# Patient Record
Sex: Female | Born: 1950 | Race: White | Hispanic: No | Marital: Single | Smoking: Never smoker
Health system: Southern US, Community
[De-identification: ages and names within clinical notes are randomized; demographics above are authoritative.]

## PROBLEM LIST (undated history)

## (undated) DIAGNOSIS — K589 Irritable bowel syndrome without diarrhea: Secondary | ICD-10-CM

## (undated) DIAGNOSIS — C419 Malignant neoplasm of bone and articular cartilage, unspecified: Secondary | ICD-10-CM

## (undated) DIAGNOSIS — J40 Bronchitis, not specified as acute or chronic: Secondary | ICD-10-CM

## (undated) DIAGNOSIS — J189 Pneumonia, unspecified organism: Secondary | ICD-10-CM

## (undated) DIAGNOSIS — M8580 Other specified disorders of bone density and structure, unspecified site: Secondary | ICD-10-CM

## (undated) DIAGNOSIS — C3490 Malignant neoplasm of unspecified part of unspecified bronchus or lung: Secondary | ICD-10-CM

## (undated) DIAGNOSIS — C719 Malignant neoplasm of brain, unspecified: Secondary | ICD-10-CM

## (undated) DIAGNOSIS — Z923 Personal history of irradiation: Secondary | ICD-10-CM

## (undated) DIAGNOSIS — Z8639 Personal history of other endocrine, nutritional and metabolic disease: Secondary | ICD-10-CM

## (undated) DIAGNOSIS — T7840XA Allergy, unspecified, initial encounter: Secondary | ICD-10-CM

## (undated) DIAGNOSIS — IMO0002 Reserved for concepts with insufficient information to code with codable children: Secondary | ICD-10-CM

## (undated) HISTORY — DX: Pneumonia, unspecified organism: J18.9

## (undated) HISTORY — DX: Personal history of other endocrine, nutritional and metabolic disease: Z86.39

## (undated) HISTORY — PX: TONSILLECTOMY AND ADENOIDECTOMY: SUR1326

## (undated) HISTORY — DX: Reserved for concepts with insufficient information to code with codable children: IMO0002

## (undated) HISTORY — DX: Other specified disorders of bone density and structure, unspecified site: M85.80

## (undated) HISTORY — DX: Irritable bowel syndrome, unspecified: K58.9

## (undated) HISTORY — PX: WISDOM TOOTH EXTRACTION: SHX21

## (undated) HISTORY — DX: Allergy, unspecified, initial encounter: T78.40XA

---

## 1999-12-13 ENCOUNTER — Other Ambulatory Visit: Admission: RE | Admit: 1999-12-13 | Discharge: 1999-12-13 | Payer: Self-pay | Admitting: Gynecology

## 2000-03-05 ENCOUNTER — Other Ambulatory Visit: Admission: RE | Admit: 2000-03-05 | Discharge: 2000-03-05 | Payer: Self-pay | Admitting: Gynecology

## 2000-07-21 ENCOUNTER — Other Ambulatory Visit: Admission: RE | Admit: 2000-07-21 | Discharge: 2000-07-21 | Payer: Self-pay | Admitting: Obstetrics and Gynecology

## 2001-01-06 ENCOUNTER — Other Ambulatory Visit: Admission: RE | Admit: 2001-01-06 | Discharge: 2001-01-06 | Payer: Self-pay | Admitting: Obstetrics and Gynecology

## 2002-01-07 ENCOUNTER — Other Ambulatory Visit: Admission: RE | Admit: 2002-01-07 | Discharge: 2002-01-07 | Payer: Self-pay | Admitting: Obstetrics and Gynecology

## 2003-01-31 ENCOUNTER — Other Ambulatory Visit: Admission: RE | Admit: 2003-01-31 | Discharge: 2003-01-31 | Payer: Self-pay | Admitting: Obstetrics and Gynecology

## 2004-02-20 ENCOUNTER — Other Ambulatory Visit: Admission: RE | Admit: 2004-02-20 | Discharge: 2004-02-20 | Payer: Self-pay | Admitting: Obstetrics and Gynecology

## 2005-01-24 ENCOUNTER — Ambulatory Visit: Payer: Self-pay | Admitting: Internal Medicine

## 2005-03-04 ENCOUNTER — Other Ambulatory Visit: Admission: RE | Admit: 2005-03-04 | Discharge: 2005-03-04 | Payer: Self-pay | Admitting: Obstetrics and Gynecology

## 2006-03-18 ENCOUNTER — Other Ambulatory Visit: Admission: RE | Admit: 2006-03-18 | Discharge: 2006-03-18 | Payer: Self-pay | Admitting: Obstetrics and Gynecology

## 2007-01-05 ENCOUNTER — Ambulatory Visit: Payer: Self-pay | Admitting: Internal Medicine

## 2007-01-05 DIAGNOSIS — T753XXA Motion sickness, initial encounter: Secondary | ICD-10-CM

## 2007-01-05 DIAGNOSIS — R011 Cardiac murmur, unspecified: Secondary | ICD-10-CM

## 2007-04-01 ENCOUNTER — Other Ambulatory Visit: Admission: RE | Admit: 2007-04-01 | Discharge: 2007-04-01 | Payer: Self-pay | Admitting: Obstetrics and Gynecology

## 2007-08-06 HISTORY — PX: COLONOSCOPY: SHX174

## 2008-05-03 ENCOUNTER — Ambulatory Visit: Payer: Self-pay | Admitting: Obstetrics and Gynecology

## 2008-05-03 ENCOUNTER — Encounter: Payer: Self-pay | Admitting: Obstetrics and Gynecology

## 2008-05-03 ENCOUNTER — Other Ambulatory Visit: Admission: RE | Admit: 2008-05-03 | Discharge: 2008-05-03 | Payer: Self-pay | Admitting: Obstetrics and Gynecology

## 2008-05-23 ENCOUNTER — Ambulatory Visit: Payer: Self-pay | Admitting: Obstetrics and Gynecology

## 2009-01-09 ENCOUNTER — Ambulatory Visit: Payer: Self-pay | Admitting: Internal Medicine

## 2009-01-09 DIAGNOSIS — H9319 Tinnitus, unspecified ear: Secondary | ICD-10-CM | POA: Insufficient documentation

## 2009-01-09 DIAGNOSIS — H6980 Other specified disorders of Eustachian tube, unspecified ear: Secondary | ICD-10-CM

## 2009-01-09 DIAGNOSIS — M7580 Other shoulder lesions, unspecified shoulder: Secondary | ICD-10-CM

## 2009-01-24 ENCOUNTER — Ambulatory Visit: Payer: Self-pay | Admitting: Internal Medicine

## 2009-02-23 ENCOUNTER — Encounter: Payer: Self-pay | Admitting: Internal Medicine

## 2009-02-23 ENCOUNTER — Ambulatory Visit: Payer: Self-pay | Admitting: Internal Medicine

## 2009-02-24 ENCOUNTER — Encounter: Payer: Self-pay | Admitting: Internal Medicine

## 2009-10-05 ENCOUNTER — Other Ambulatory Visit: Admission: RE | Admit: 2009-10-05 | Discharge: 2009-10-05 | Payer: Self-pay | Admitting: Obstetrics and Gynecology

## 2009-10-05 ENCOUNTER — Ambulatory Visit: Payer: Self-pay | Admitting: Obstetrics and Gynecology

## 2012-03-09 ENCOUNTER — Encounter: Payer: Self-pay | Admitting: Internal Medicine

## 2012-03-09 ENCOUNTER — Ambulatory Visit (INDEPENDENT_AMBULATORY_CARE_PROVIDER_SITE_OTHER): Payer: BC Managed Care – PPO | Admitting: Internal Medicine

## 2012-03-09 VITALS — BP 140/90 | HR 58 | Temp 97.8°F | Wt 154.0 lb

## 2012-03-09 DIAGNOSIS — R197 Diarrhea, unspecified: Secondary | ICD-10-CM

## 2012-03-09 LAB — CBC WITH DIFFERENTIAL/PLATELET
Basophils Absolute: 0 10*3/uL (ref 0.0–0.1)
Basophils Relative: 0.5 % (ref 0.0–3.0)
Eosinophils Absolute: 0.1 10*3/uL (ref 0.0–0.7)
HCT: 38.6 % (ref 36.0–46.0)
Hemoglobin: 13.1 g/dL (ref 12.0–15.0)
Lymphs Abs: 2 10*3/uL (ref 0.7–4.0)
MCHC: 34 g/dL (ref 30.0–36.0)
MCV: 93.1 fl (ref 78.0–100.0)
Monocytes Absolute: 0.4 10*3/uL (ref 0.1–1.0)
Neutro Abs: 3.8 10*3/uL (ref 1.4–7.7)
RBC: 4.14 Mil/uL (ref 3.87–5.11)
RDW: 13.1 % (ref 11.5–14.6)

## 2012-03-09 LAB — BASIC METABOLIC PANEL
CO2: 27 mEq/L (ref 19–32)
Chloride: 106 mEq/L (ref 96–112)
Glucose, Bld: 81 mg/dL (ref 70–99)
Sodium: 140 mEq/L (ref 135–145)

## 2012-03-09 MED ORDER — HYOSCYAMINE SULFATE 0.125 MG SL SUBL: 0.1250 mg | SUBLINGUAL_TABLET | SUBLINGUAL | Status: DC | PRN

## 2012-03-09 NOTE — Patient Instructions (Addendum)
Please take the probiotic , Align, every day until the bowels are normal. This will replace the normal bacteria which  are necessary for formation of normal stool and processing of food.  Please try to go on My Chart within the next 24 hours to allow me to release the results directly to you.  

## 2012-03-09 NOTE — Progress Notes (Signed)
  Subjective:    Patient ID: YSENIA FILICE, female    DOB: [birth year], 61 y.o.   MRN: [id number]  HPI In the last 6 weeks she has had frank "explosive" diarrhea at least once a week up to 2 very large BMs with cramping. She's had chronic issues with increased elimitation following the ingestion of fresh fruits and vegetables or red meat. She quit eating red meat 20 years ago. In the last 2 weeks she has stopped eating salads with some improvement in her symptoms. She does have some abdominal discomfort in the lateral abdomen bilaterally and some sweats with the diarrhea.  Her last colonoscopy was in 2009: It was negative. There is no personal or family history of ulcers, colitis, colon polyps, or colon cancer.    Review of Systems She denies nausea, vomiting, weight loss, decreased urine output, lightheadedness, fever, melena, rectal bleeding or chills. She does have a well and drinks from it. No one else using the  well has symptoms. Other than restricting salads; her dietary changes including increased "comfort foods". She has not had antibiotics in the recent past; foreign travel; or exposure to sick animals.  She did have to place her mother in a retirement facility; but she denies any significant anxiety or depression        Objective:   Physical Exam General appearance is one of good health and nourishment w/o distress.  Eyes: No conjunctival inflammation or scleral icterus is present.  Neck: Thyroid normal to palpation  Oral exam: Dental hygiene is good; lips and gums are healthy appearing.There is no oropharyngeal erythema or exudate noted.   Heart:  Normal rate and regular rhythm. S1 and S2 normal without gallop,  click, rub or other extra sounds  .Grade 1/2-1 over 6 systolic murmur @ R base   Lungs:Chest clear to auscultation; no wheezes, rhonchi,rales ,or rubs present.No increased work of breathing.   Abdomen: bowel sounds normal, soft and non-tender without masses, organomegaly  or hernias noted.  No guarding or rebound   Skin:Warm & dry.  Intact without suspicious lesions or rashes ; no jaundice or tenting  Lymphatic: No lymphadenopathy is noted about the head, neck, axilla areas.    Vascular: All pulses intact with no deficits or bruits.  Neuro: Deep tendon reflexes equal and normal. Alert and oriented x3. No tremor           Assessment & Plan:   #1 explosive diarrhea on average once a week. History suggests a possible trigger rather than underlying GI issues. She'll be placed on Align as a trial of prevention. Levsin SL will be prescribed as needed for the symptoms. See labs

## 2012-04-29 ENCOUNTER — Encounter: Payer: Self-pay | Admitting: Obstetrics and Gynecology

## 2012-05-08 ENCOUNTER — Encounter: Payer: Self-pay | Admitting: Internal Medicine

## 2012-05-08 ENCOUNTER — Ambulatory Visit (INDEPENDENT_AMBULATORY_CARE_PROVIDER_SITE_OTHER): Payer: BC Managed Care – PPO | Admitting: Internal Medicine

## 2012-05-08 ENCOUNTER — Telehealth: Payer: Self-pay | Admitting: Internal Medicine

## 2012-05-08 VITALS — BP 118/80 | HR 72 | Temp 98.0°F | Resp 12 | Ht 67.5 in | Wt 151.2 lb

## 2012-05-08 DIAGNOSIS — H8110 Benign paroxysmal vertigo, unspecified ear: Secondary | ICD-10-CM

## 2012-05-08 DIAGNOSIS — H9312 Tinnitus, left ear: Secondary | ICD-10-CM

## 2012-05-08 DIAGNOSIS — H9319 Tinnitus, unspecified ear: Secondary | ICD-10-CM

## 2012-05-08 DIAGNOSIS — Z23 Encounter for immunization: Secondary | ICD-10-CM

## 2012-05-08 DIAGNOSIS — Z0000 Encounter for general adult medical examination without abnormal findings: Secondary | ICD-10-CM

## 2012-05-08 LAB — LIPID PANEL
Cholesterol: 153 mg/dL (ref 0–200)
VLDL: 10.8 mg/dL (ref 0.0–40.0)

## 2012-05-08 NOTE — Progress Notes (Signed)
  Subjective:    Patient ID: Laura Walter, female    DOB: 1950/12/25, 61 y.o.   MRN: [id number]  HPI  She  is here for a physical;acute issues include motion sickness and benign positional vertigo.      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 She feels lightheaded when she steps off her treadmill and also with rapid rotational movement of her head. She also describes BPV symptoms   She uses Transderm-Scop to treat motion sickness as needed     Objective:   Physical Exam Gen.: Thin but healthy and well-nourished in appearance. Alert, appropriate and cooperative throughout exam. Head: Normocephalic without obvious abnormalities  Eyes: No corneal or conjunctival inflammation noted. Pupils equal round reactive to light and accommodation. Fundal exam is benign without hemorrhages, exudate, papilledema. Extraocular motion intact. Vision grossly normal with lenses.Ptosis OD > OS Ears: External  ear exam reveals no significant lesions or deformities. Canals clear .TMs normal. Hearing is grossly normal bilaterally.Tuning fork exam normal Nose: External nasal exam reveals no deformity or inflammation. Nasal mucosa are pink and moist. No lesions or exudates noted. Septum  Slightly deviated Mouth: Oral mucosa and oropharynx reveal no lesions or exudates. Teeth in good repair. Neck: No deformities, masses, or tenderness noted. Range of motion & Thyroid normal. Lungs: Normal respiratory effort; chest expands symmetrically. Lungs are clear to auscultation without rales, wheezes, or increased work of breathing. Heart: Normal rate and rhythm. Normal S1 and S2. No gallop, click, or rub. S4 with slurring at base . Abdomen: Bowel sounds normal; abdomen soft and nontender. No masses, organomegaly or hernias noted. Genitalia:as per Gyn                                                      Musculoskeletal/extremities: No deformity or scoliosis  noted of  the thoracic or lumbar spine. No clubbing, cyanosis, edema, or deformity noted. Range of motion  normal .Tone & strength  Normal.Joints:minor isolated DIP changes. Nail health  good. Vascular: Carotid, radial artery, dorsalis pedis and  posterior tibial pulses are full and equal. No bruits present. Neurologic: Alert and oriented x3. Deep tendon reflexes symmetrical and normal.          Skin: Intact without suspicious lesions or rashes. Lymph: No cervical, axillary lymphadenopathy present. Psych: Mood and affect are normal. Normally interactive                                                                                        Assessment & Plan:  #1 comprehensive physical exam; no acute findings #2 see Problem List with Assessments & Recommendations Plan: see Orders

## 2012-05-08 NOTE — Telephone Encounter (Signed)
Dr. Alwyn Ren, in reference to referral entered to Dr. Gladys Damme, as required by their office, I reviewed all patient's symptoms, and read your notes from the exam.  His office states because the patient does not have hearing loss, they are suggesting she start by being seen by Neurology.  (It is always so hard to get his office to accept a patient).  Please advise where to send patient, [facility], or to a neurologist?

## 2012-05-08 NOTE — Patient Instructions (Addendum)
Preventive Health Care: Exercise  30-45  minutes a day, 3-4 days a week. Walking is especially valuable in preventing Osteoporosis. Eat a low-fat diet with lots of fruits and vegetables, up to 7-9 servings per day. Consume less than 30 grams of sugar per day from foods & drinks with High Fructose Corn Syrup as #1,2,3 or #4 on label. Review and correct the record as indicated. Please share record with all medical staff seen.   If you activate My Chart; the results can be released to you as soon as they populate from the lab. If you choose not to use this program; the labs have to be reviewed, copied & mailed  causing a delay in getting the results to you.

## 2012-05-11 NOTE — Telephone Encounter (Signed)
So, at this point, I am awaiting her response to you, then you will let me know where to refer?

## 2012-05-11 NOTE — Telephone Encounter (Signed)
I have asked her to consider referral to [facility] or another Medical Center to evaluate her symptoms.

## 2012-05-18 NOTE — Telephone Encounter (Signed)
Per my call to patient, she declined consult with [facility], wants to stay local.  I have referred patient to [facility] per her request.

## 2012-08-11 ENCOUNTER — Telehealth: Payer: Self-pay | Admitting: *Deleted

## 2012-08-11 NOTE — Telephone Encounter (Signed)
Transderm scop one patch every 3 days while on a boat.  Verify # needed please

## 2012-08-11 NOTE — Telephone Encounter (Signed)
Pt left VM that she would like to renew her Rx for the motion sickness med..Please advise    Left message to call office to get further details.

## 2012-08-12 MED ORDER — SCOPOLAMINE 1 MG/3DAYS TD PT72: 1.0000 | MEDICATED_PATCH | TRANSDERMAL | Status: DC

## 2012-08-12 NOTE — Telephone Encounter (Signed)
Pt states that she will be gone 5 days. Pt aware Rx sent.

## 2012-09-19 ENCOUNTER — Other Ambulatory Visit: Payer: Self-pay

## 2013-01-01 ENCOUNTER — Other Ambulatory Visit: Payer: Self-pay | Admitting: Internal Medicine

## 2013-06-10 ENCOUNTER — Other Ambulatory Visit: Payer: Self-pay

## 2013-09-13 ENCOUNTER — Encounter: Payer: Self-pay | Admitting: Gynecology

## 2013-10-07 ENCOUNTER — Encounter: Payer: Self-pay | Admitting: Gynecology

## 2013-10-07 ENCOUNTER — Ambulatory Visit (INDEPENDENT_AMBULATORY_CARE_PROVIDER_SITE_OTHER): Payer: BC Managed Care – PPO | Admitting: Gynecology

## 2013-10-07 ENCOUNTER — Other Ambulatory Visit (HOSPITAL_COMMUNITY)
Admission: RE | Admit: 2013-10-07 | Discharge: 2013-10-07 | Disposition: A | Discharge status: Home / Self Care | Payer: BC Managed Care – PPO | Source: Ambulatory Visit | Attending: Gynecology | Admitting: Gynecology

## 2013-10-07 VITALS — BP 136/90 | Ht 67.25 in | Wt 152.9 lb

## 2013-10-07 DIAGNOSIS — M8580 Other specified disorders of bone density and structure, unspecified site: Secondary | ICD-10-CM | POA: Insufficient documentation

## 2013-10-07 DIAGNOSIS — Z01419 Encounter for gynecological examination (general) (routine) without abnormal findings: Secondary | ICD-10-CM | POA: Insufficient documentation

## 2013-10-07 DIAGNOSIS — N951 Menopausal and female climacteric states: Secondary | ICD-10-CM

## 2013-10-07 DIAGNOSIS — Z1151 Encounter for screening for human papillomavirus (HPV): Secondary | ICD-10-CM | POA: Insufficient documentation

## 2013-10-07 DIAGNOSIS — M899 Disorder of bone, unspecified: Secondary | ICD-10-CM

## 2013-10-07 DIAGNOSIS — I10 Essential (primary) hypertension: Secondary | ICD-10-CM

## 2013-10-07 DIAGNOSIS — Z780 Asymptomatic menopausal state: Secondary | ICD-10-CM

## 2013-10-07 DIAGNOSIS — M949 Disorder of cartilage, unspecified: Secondary | ICD-10-CM

## 2013-10-07 NOTE — Progress Notes (Addendum)
Laura Walter 1951-01-27 [id number]   History:    63 y.o.  for annual gyn exam with no complaints today. Patient has not been seen in the office since 2011. She reached menopause in 2009. Patient has never been on hormone replacement therapy. The patient is in a same-sex relationship. Patient had a normal colonoscopy in 2010. Last bone density study  was in 2009 with a T score of -1.8 at the left femoral neck. She had a normal FRAX analysis.  Past medical history,surgical history, family history and social history were all reviewed and documented in the EPIC chart.  Gynecologic History No LMP recorded. Patient is postmenopausal. Contraception: post menopausal status Last Pap: 2011. Results were: normal Last mammogram: 2015. Results were: normal  Obstetric History OB History  No data available     ROS: A ROS was performed and pertinent positives and negatives are included in the history.  GENERAL: No fevers or chills. HEENT: No change in vision, no earache, sore throat or sinus congestion. NECK: No pain or stiffness. CARDIOVASCULAR: No chest pain or pressure. No palpitations. PULMONARY: No shortness of breath, cough or wheeze. GASTROINTESTINAL: No abdominal pain, nausea, vomiting or diarrhea, melena or bright red blood per rectum. GENITOURINARY: No urinary frequency, urgency, hesitancy or dysuria. MUSCULOSKELETAL: No joint or muscle pain, no back pain, no recent trauma. DERMATOLOGIC: No rash, no itching, no lesions. ENDOCRINE: No polyuria, polydipsia, no heat or cold intolerance. No recent change in weight. HEMATOLOGICAL: No anemia or easy bruising or bleeding. NEUROLOGIC: No headache, seizures, numbness, tingling or weakness. PSYCHIATRIC: No depression, no loss of interest in normal activity or change in sleep pattern.     Exam: chaperone present  BP 136/90  Ht 5' 7.25" (1.708 m)  Wt 152 lb 14.4 oz (69.355 kg)  BMI 23.77 kg/m2  Body mass index is 23.77 kg/(m^2).  General appearance  : Well developed well nourished female. No acute distress HEENT: Neck supple, trachea midline, no carotid bruits, no thyroidmegaly Lungs: Clear to auscultation, no rhonchi or wheezes, or rib retractions  Heart: Regular rate and rhythm, no murmurs or gallops Breast:Examined in sitting and supine position were symmetrical in appearance, no palpable masses or tenderness,  no skin retraction, no nipple inversion, no nipple discharge, no skin discoloration, no axillary or supraclavicular lymphadenopathy Abdomen: no palpable masses or tenderness, no rebound or guarding Extremities: no edema or skin discoloration or tenderness  Pelvic:  Bartholin, Urethra, Skene Glands: Within normal limits             Vagina: No gross lesions or discharge, atrophic changes  Cervix: No gross lesions or discharge  Uterus  anteverted, normal size, shape and consistency, non-tender and mobile  Adnexa  Without masses or tenderness  Anus and perineum  normal   Rectovaginal  normal sphincter tone without palpated masses or tenderness             Hemoccult card provided   Blood pressure readings: 136/90, 140/92, 142/86  Assessment/Plan:  63 y.o. female for annual exam with borderline hypertension. She has not seen her PCP over a year and a half ago and he is cord to be retiring. I have given her names of colleagues in the same group that she may follow. I have asked her to maintain a log of her blood pressure readings for the next 1-2 weeks so that she can take to that office visit. She will return back next week and a fasting state for the following labs: Fasting  lipid profile, TSH, comprehensive metabolic panel, CBC, and urinalysis. Pap smear with HPV screen was done today and the new guidelines were discussed with the patient. She was provided with Hemoccult cards to submit to the office when she comes to the office for her bone density study and her blood test. She was reminded on the importance of calcium and vitamin D  and regular exercise for osteoporosis prevention.  Note: This dictation was prepared with  Dragon/digital dictation along withSmart phrase technology. Any transcriptional errors that result from this process are unintentional.   Terrance Mass MD, 9:55 AM 10/07/2013

## 2013-10-07 NOTE — Patient Instructions (Signed)
Bone Densitometry Bone densitometry is a special X-ray that measures your bone density and can be used to help predict your risk of bone fractures. This test is used to determine bone mineral content and density to diagnose osteoporosis. Osteoporosis is the loss of bone that may cause the bone to become weak. Osteoporosis commonly occurs in women entering menopause. However, it may be found in men and in people with other diseases. PREPARATION FOR TEST No preparation necessary. WHO SHOULD BE TESTED?  All women older than 65.  Postmenopausal women (50 to 65) with risk factors for osteoporosis.  People with a previous fracture caused by normal activities.  People with a small body frame (less than 127 poundsor a body mass index [BMI] of less than 21).  People who have a parent with a hip fracture or history of osteoporosis.  People who smoke.  People who have rheumatoid arthritis.  Anyone who engages in excessive alcohol use (more than 3 drinks most days).  Women who experience early menopause. WHEN SHOULD YOU BE RETESTED? Current guidelines suggest that you should wait at least 2 years before doing a bone density test again if your first test was normal.Recent studies indicated that women with normal bone density may be able to wait a few years before needing to repeat a bone density test. You should discuss this with your caregiver.  NORMAL FINDINGS   Normal: less than standard deviation below normal (greater than -1).  Osteopenia: 1 to 2.5 standard deviations below normal (-1 to -2.5).  Osteoporosis: greater than 2.5 standard deviations below normal (less than -2.5). Test results are reported as a "T score" and a "Z score."The T score is a number that compares your bone density with the bone density of healthy, young women.The Z score is a number that compares your bone density with the scores of women who are the same age, gender, and race.  Ranges for normal findings may vary  among different laboratories and hospitals. You should always check with your doctor after having lab work or other tests done to discuss the meaning of your test results and whether your values are considered within normal limits. MEANING OF TEST  Your caregiver will go over the test results with you and discuss the importance and meaning of your results, as well as treatment options and the need for additional tests if necessary. OBTAINING THE TEST RESULTS It is your responsibility to obtain your test results. Ask the lab or department performing the test when and how you will get your results. Document Released: 08/13/2004 Document Revised: 10/14/2011 Document Reviewed: 09/05/2010 Exit[employer] Patient Information 2014 [employer].  

## 2013-10-08 ENCOUNTER — Telehealth: Payer: Self-pay | Admitting: *Deleted

## 2013-10-08 MED ORDER — SCOPOLAMINE 1 MG/3DAYS TD PT72: 1.0000 | MEDICATED_PATCH | TRANSDERMAL | Status: DC

## 2013-10-08 NOTE — Telephone Encounter (Signed)
6 days worth--flying to [facility] [address] in [place]

## 2013-10-08 NOTE — Telephone Encounter (Signed)
Scopolamine script ordered and erx to pharmacy & pt notified.

## 2013-10-08 NOTE — Telephone Encounter (Signed)
#  2 , 1 every 3 days

## 2013-10-08 NOTE — Telephone Encounter (Signed)
Patient phoned requesting a script for "behind the ear travel patches"...scopolamine?  Please advise.  CB# [phone]

## 2013-10-08 NOTE — Telephone Encounter (Signed)
Trans Derm Scop one patch every 3 days on open water; please determine correct number needed

## 2013-11-02 ENCOUNTER — Ambulatory Visit (INDEPENDENT_AMBULATORY_CARE_PROVIDER_SITE_OTHER): Payer: BC Managed Care – PPO

## 2013-11-02 ENCOUNTER — Encounter: Payer: Self-pay | Admitting: Gynecology

## 2013-11-02 ENCOUNTER — Other Ambulatory Visit: Payer: BC Managed Care – PPO

## 2013-11-02 DIAGNOSIS — M949 Disorder of cartilage, unspecified: Secondary | ICD-10-CM

## 2013-11-02 DIAGNOSIS — M8580 Other specified disorders of bone density and structure, unspecified site: Secondary | ICD-10-CM

## 2013-11-02 DIAGNOSIS — Z01419 Encounter for gynecological examination (general) (routine) without abnormal findings: Secondary | ICD-10-CM

## 2013-11-02 DIAGNOSIS — M899 Disorder of bone, unspecified: Secondary | ICD-10-CM

## 2013-11-02 LAB — CBC WITH DIFFERENTIAL/PLATELET
BASOS ABS: 0 10*3/uL (ref 0.0–0.1)
Basophils Relative: 1 % (ref 0–1)
EOS PCT: 3 % (ref 0–5)
Eosinophils Absolute: 0.1 10*3/uL (ref 0.0–0.7)
HCT: 39.2 % (ref 36.0–46.0)
Hemoglobin: 13.6 g/dL (ref 12.0–15.0)
LYMPHS ABS: 1.5 10*3/uL (ref 0.7–4.0)
LYMPHS PCT: 39 % (ref 12–46)
MCH: 30.6 pg (ref 26.0–34.0)
MCHC: 34.7 g/dL (ref 30.0–36.0)
MCV: 88.3 fL (ref 78.0–100.0)
Monocytes Absolute: 0.3 10*3/uL (ref 0.1–1.0)
Monocytes Relative: 7 % (ref 3–12)
NEUTROS PCT: 50 % (ref 43–77)
Neutro Abs: 2 10*3/uL (ref 1.7–7.7)
Platelets: 186 10*3/uL (ref 150–400)
RBC: 4.44 MIL/uL (ref 3.87–5.11)
RDW: 13.4 % (ref 11.5–15.5)
WBC: 3.9 10*3/uL — AB (ref 4.0–10.5)

## 2013-11-02 LAB — COMPREHENSIVE METABOLIC PANEL
ALBUMIN: 4.5 g/dL (ref 3.5–5.2)
ALT: 12 U/L (ref 0–35)
AST: 16 U/L (ref 0–37)
Alkaline Phosphatase: 60 U/L (ref 39–117)
BUN: 21 mg/dL (ref 6–23)
CHLORIDE: 106 meq/L (ref 96–112)
CO2: 26 meq/L (ref 19–32)
Calcium: 9.4 mg/dL (ref 8.4–10.5)
Creat: 0.83 mg/dL (ref 0.50–1.10)
Glucose, Bld: 85 mg/dL (ref 70–99)
POTASSIUM: 4.3 meq/L (ref 3.5–5.3)
SODIUM: 139 meq/L (ref 135–145)
Total Bilirubin: 0.8 mg/dL (ref 0.2–1.2)
Total Protein: 6.8 g/dL (ref 6.0–8.3)

## 2013-11-02 LAB — LIPID PANEL
CHOL/HDL RATIO: 3 ratio
CHOLESTEROL: 143 mg/dL (ref 0–200)
HDL: 48 mg/dL (ref >39)
LDL Cholesterol: 84 mg/dL (ref 0–99)
Triglycerides: 53 mg/dL (ref <150)
VLDL: 11 mg/dL (ref 0–40)

## 2013-11-02 LAB — TSH: TSH: 1.622 u[IU]/mL (ref 0.350–4.500)

## 2013-11-03 LAB — URINALYSIS W MICROSCOPIC + REFLEX CULTURE
Bacteria, UA: NONE SEEN
Bilirubin Urine: NEGATIVE
Casts: NONE SEEN
Crystals: NONE SEEN
Glucose, UA: NEGATIVE mg/dL
Ketones, ur: NEGATIVE mg/dL
Leukocytes, UA: NEGATIVE
NITRITE: NEGATIVE
Protein, ur: NEGATIVE mg/dL
SQUAMOUS EPITHELIAL / LPF: NONE SEEN
Specific Gravity, Urine: 1.023 (ref 1.005–1.030)
Urobilinogen, UA: 0.2 mg/dL (ref 0.0–1.0)
pH: 5.5 (ref 5.0–8.0)

## 2013-11-08 ENCOUNTER — Other Ambulatory Visit: Payer: Self-pay | Admitting: *Deleted

## 2013-11-08 DIAGNOSIS — M8580 Other specified disorders of bone density and structure, unspecified site: Secondary | ICD-10-CM

## 2013-11-16 ENCOUNTER — Other Ambulatory Visit: Payer: BC Managed Care – PPO

## 2013-11-16 DIAGNOSIS — M8580 Other specified disorders of bone density and structure, unspecified site: Secondary | ICD-10-CM

## 2013-11-17 LAB — PARATHYROID HORMONE, INTACT (NO CA): PTH: 48.6 pg/mL (ref 14.0–72.0)

## 2013-11-17 LAB — VITAMIN D 25 HYDROXY (VIT D DEFICIENCY, FRACTURES): VIT D 25 HYDROXY: 36 ng/mL (ref 30–89)

## 2013-11-26 ENCOUNTER — Ambulatory Visit (INDEPENDENT_AMBULATORY_CARE_PROVIDER_SITE_OTHER): Payer: BC Managed Care – PPO | Admitting: Gynecology

## 2013-11-26 ENCOUNTER — Encounter: Payer: Self-pay | Admitting: Gynecology

## 2013-11-26 VITALS — BP 140/88

## 2013-11-26 DIAGNOSIS — M899 Disorder of bone, unspecified: Secondary | ICD-10-CM

## 2013-11-26 DIAGNOSIS — M8580 Other specified disorders of bone density and structure, unspecified site: Secondary | ICD-10-CM

## 2013-11-26 DIAGNOSIS — M949 Disorder of cartilage, unspecified: Secondary | ICD-10-CM

## 2013-11-26 NOTE — Progress Notes (Signed)
   Patient is a 63 year old who presented to the office today to discuss her bone density study done here in our office recently. The patient reached menopause in 2009 and had never been on hormone replacement therapy.Last bone density study was in 2009 with a T score of -1.8 at the left femoral neck. She had a normal FRAX analysis.  Bone density study was compared in her fertility from previous study done in 2009 as follows: There was significant decrease in bone mineralization as follows: -6.5% AP spine -5.4% total left hip -8.3% total right hip  The lowest T score was at the left femoral neck -1.8  FRAX Analysis indicated that her ten-year risk of osteoporotic fracture any part of her body is 9% and a risk of a hip fracture in the next 10 years is 1% both sub-threshold values indicating patient does not need to be placed on any anti-resorptive agents at the present time. Patient recently had normal calcium, vitamin D and PTH levels. Recent CBC, comprehensive metabolic panel, and TSH were all normal as well. Patient was reminded to continue her calcium approximately 1200 mg daily along with 10,000 units of vitamin D 3 daily. Will repeat her bone density study in 2 years. All questions answered.

## 2014-02-16 ENCOUNTER — Encounter: Payer: Self-pay | Admitting: Internal Medicine

## 2014-05-20 ENCOUNTER — Other Ambulatory Visit: Payer: Self-pay

## 2014-08-26 ENCOUNTER — Ambulatory Visit: Payer: BC Managed Care – PPO | Admitting: Family Medicine

## 2014-10-12 ENCOUNTER — Telehealth: Payer: Self-pay

## 2014-10-12 NOTE — Telephone Encounter (Signed)
See speciality notes 

## 2014-10-13 ENCOUNTER — Encounter: Payer: Self-pay | Admitting: Family Medicine

## 2014-10-13 ENCOUNTER — Ambulatory Visit (INDEPENDENT_AMBULATORY_CARE_PROVIDER_SITE_OTHER): Payer: BC Managed Care – PPO | Admitting: Family Medicine

## 2014-10-13 VITALS — BP 124/88 | HR 88 | Temp 98.0°F | Resp 16 | Ht 67.0 in | Wt 155.2 lb

## 2014-10-13 DIAGNOSIS — Z0000 Encounter for general adult medical examination without abnormal findings: Secondary | ICD-10-CM | POA: Insufficient documentation

## 2014-10-13 DIAGNOSIS — M859 Disorder of bone density and structure, unspecified: Secondary | ICD-10-CM | POA: Diagnosis not present

## 2014-10-13 DIAGNOSIS — Z0184 Encounter for antibody response examination: Secondary | ICD-10-CM

## 2014-10-13 LAB — LIPID PANEL
CHOL/HDL RATIO: 3
Cholesterol: 119 mg/dL (ref 0–200)
HDL: 42.4 mg/dL (ref >39.00)
LDL Cholesterol: 64 mg/dL (ref 0–99)
NonHDL: 76.6
TRIGLYCERIDES: 61 mg/dL (ref 0.0–149.0)
VLDL: 12.2 mg/dL (ref 0.0–40.0)

## 2014-10-13 LAB — CBC WITH DIFFERENTIAL/PLATELET
BASOS ABS: 0 10*3/uL (ref 0.0–0.1)
Basophils Relative: 0.5 % (ref 0.0–3.0)
EOS ABS: 0.1 10*3/uL (ref 0.0–0.7)
Eosinophils Relative: 1.4 % (ref 0.0–5.0)
HEMATOCRIT: 38.4 % (ref 36.0–46.0)
Hemoglobin: 12.9 g/dL (ref 12.0–15.0)
LYMPHS PCT: 27.4 % (ref 12.0–46.0)
Lymphs Abs: 1.5 10*3/uL (ref 0.7–4.0)
MCHC: 33.7 g/dL (ref 30.0–36.0)
MCV: 90.1 fl (ref 78.0–100.0)
MONOS PCT: 6.3 % (ref 3.0–12.0)
Monocytes Absolute: 0.4 10*3/uL (ref 0.1–1.0)
Neutro Abs: 3.6 10*3/uL (ref 1.4–7.7)
Neutrophils Relative %: 64.4 % (ref 43.0–77.0)
Platelets: 215 10*3/uL (ref 150.0–400.0)
RBC: 4.26 Mil/uL (ref 3.87–5.11)
RDW: 12.9 % (ref 11.5–15.5)
WBC: 5.6 10*3/uL (ref 4.0–10.5)

## 2014-10-13 LAB — BASIC METABOLIC PANEL
BUN: 22 mg/dL (ref 6–23)
CALCIUM: 9.5 mg/dL (ref 8.4–10.5)
CO2: 28 meq/L (ref 19–32)
CREATININE: 0.98 mg/dL (ref 0.40–1.20)
Chloride: 108 mEq/L (ref 96–112)
GFR: 61.27 mL/min (ref >60.00)
GLUCOSE: 95 mg/dL (ref 70–99)
POTASSIUM: 4.2 meq/L (ref 3.5–5.1)
SODIUM: 140 meq/L (ref 135–145)

## 2014-10-13 LAB — HEPATIC FUNCTION PANEL
ALK PHOS: 63 U/L (ref 39–117)
ALT: 12 U/L (ref 0–35)
AST: 14 U/L (ref 0–37)
Albumin: 4.3 g/dL (ref 3.5–5.2)
Bilirubin, Direct: 0.1 mg/dL (ref 0.0–0.3)
Total Bilirubin: 0.6 mg/dL (ref 0.2–1.2)
Total Protein: 7.1 g/dL (ref 6.0–8.3)

## 2014-10-13 LAB — VITAMIN D 25 HYDROXY (VIT D DEFICIENCY, FRACTURES): VITD: 25.85 ng/mL — AB (ref 30.00–100.00)

## 2014-10-13 LAB — TSH: TSH: 1.35 u[IU]/mL (ref 0.35–4.50)

## 2014-10-13 MED ORDER — DICYCLOMINE HCL 20 MG PO TABS: 20.0000 mg | ORAL_TABLET | Freq: Four times a day (QID) | ORAL | Status: DC

## 2014-10-13 NOTE — Progress Notes (Signed)
Pre visit review using our clinic review tool, if applicable. No additional management support is needed unless otherwise documented below in the visit note. 

## 2014-10-13 NOTE — Assessment & Plan Note (Signed)
Pt's PE WNL.  UTD on GYN, colonoscopy.  Check labs.  Anticipatory guidance provided.

## 2014-10-13 NOTE — Patient Instructions (Signed)
Follow up in 1 year or as needed We'll notify you of your lab results and make any changes if needed Keep up the good work!  You look great!!! Call and schedule with Dr Toney Rakes- it's time for a mammo Call with any questions or concerns Welcome!  We're glad to have you!! Happy Spring!!

## 2014-10-13 NOTE — Progress Notes (Signed)
   Subjective:    Patient ID: Laura Walter, female    DOB: [birth year], 64 y.o.   MRN: [id number]  HPI New to establish.  Previous MD- O'Brien  CPE- UTD on colonoscopy.  No concerns   Review of Systems Patient reports no vision/ hearing changes, adenopathy,fever, weight change,  persistant/recurrent hoarseness , swallowing issues, chest pain, palpitations, edema, persistant/recurrent cough, hemoptysis, dyspnea (rest/exertional/paroxysmal nocturnal), gastrointestinal bleeding (melena, rectal bleeding), abdominal pain, significant heartburn, bowel changes, GU symptoms (dysuria, hematuria, incontinence), Gyn symptoms (abnormal  bleeding, pain),  syncope, focal weakness, memory loss, numbness & tingling, skin/hair/nail changes, abnormal bruising or bleeding, anxiety, or depression.     Objective:   Physical Exam General Appearance:    Alert, cooperative, no distress, appears stated age  Head:    Normocephalic, without obvious abnormality, atraumatic  Eyes:    PERRL, conjunctiva/corneas clear, EOM's intact, fundi    benign, both eyes  Ears:    Normal TM's and external ear canals, both ears  Nose:   Nares normal, septum midline, mucosa normal, no drainage    or sinus tenderness  Throat:   Lips, mucosa, and tongue normal; teeth and gums normal  Neck:   Supple, symmetrical, trachea midline, no adenopathy;    Thyroid: no enlargement/tenderness/nodules  Back:     Symmetric, no curvature, ROM normal, no CVA tenderness  Lungs:     Clear to auscultation bilaterally, respirations unlabored  Chest Wall:    No tenderness or deformity   Heart:    Regular rate and rhythm, S1 and S2 normal, no murmur, rub   or gallop  Breast Exam:    Deferred to GYN  Abdomen:     Soft, non-tender, bowel sounds active all four quadrants,    no masses, no organomegaly  Genitalia:    Deferred to GYN  Rectal:    Extremities:   Extremities normal, atraumatic, no cyanosis or edema  Pulses:   2+ and symmetric all  extremities  Skin:   Skin color, texture, turgor normal, no rashes or lesions  Lymph nodes:   Cervical, supraclavicular, and axillary nodes normal  Neurologic:   CNII-XII intact, normal strength, sensation and reflexes    throughout          Assessment & Plan:

## 2014-10-14 LAB — VARICELLA ZOSTER ANTIBODY, IGG: VARICELLA IGG: 3437 {index} — AB (ref <135.00)

## 2014-12-28 ENCOUNTER — Telehealth: Payer: Self-pay | Admitting: Internal Medicine

## 2014-12-28 NOTE — Telephone Encounter (Signed)
NEW PATIENT APPT-S/W KATE @ [facility] AND GAVE NP APPT FOR 05/31 @ 1:45 W/DR. [place] DX-METS CANCER FOUND ON LUMBAR SPINE MRI

## 2014-12-30 ENCOUNTER — Other Ambulatory Visit: Payer: Self-pay | Admitting: Medical Oncology

## 2014-12-30 DIAGNOSIS — C799 Secondary malignant neoplasm of unspecified site: Secondary | ICD-10-CM

## 2015-01-03 ENCOUNTER — Telehealth: Payer: Self-pay | Admitting: Internal Medicine

## 2015-01-03 ENCOUNTER — Encounter: Payer: Self-pay | Admitting: Internal Medicine

## 2015-01-03 ENCOUNTER — Other Ambulatory Visit (HOSPITAL_BASED_OUTPATIENT_CLINIC_OR_DEPARTMENT_OTHER): Payer: BC Managed Care – PPO

## 2015-01-03 ENCOUNTER — Other Ambulatory Visit: Payer: Self-pay | Admitting: Internal Medicine

## 2015-01-03 ENCOUNTER — Other Ambulatory Visit: Payer: Self-pay | Admitting: Medical Oncology

## 2015-01-03 ENCOUNTER — Ambulatory Visit (HOSPITAL_BASED_OUTPATIENT_CLINIC_OR_DEPARTMENT_OTHER): Payer: BC Managed Care – PPO | Admitting: Internal Medicine

## 2015-01-03 ENCOUNTER — Ambulatory Visit: Payer: BC Managed Care – PPO

## 2015-01-03 VITALS — BP 183/86 | HR 67 | Temp 98.1°F | Resp 18 | Ht 67.0 in | Wt 154.8 lb

## 2015-01-03 DIAGNOSIS — M899 Disorder of bone, unspecified: Secondary | ICD-10-CM

## 2015-01-03 DIAGNOSIS — R52 Pain, unspecified: Secondary | ICD-10-CM | POA: Diagnosis not present

## 2015-01-03 DIAGNOSIS — C801 Malignant (primary) neoplasm, unspecified: Secondary | ICD-10-CM

## 2015-01-03 DIAGNOSIS — D759 Disease of blood and blood-forming organs, unspecified: Secondary | ICD-10-CM

## 2015-01-03 DIAGNOSIS — C7951 Secondary malignant neoplasm of bone: Secondary | ICD-10-CM | POA: Insufficient documentation

## 2015-01-03 DIAGNOSIS — C799 Secondary malignant neoplasm of unspecified site: Secondary | ICD-10-CM

## 2015-01-03 LAB — COMPREHENSIVE METABOLIC PANEL (CC13)
ALBUMIN: 3.7 g/dL (ref 3.5–5.0)
ALK PHOS: 90 U/L (ref 40–150)
ALT: 10 U/L (ref 0–55)
AST: 15 U/L (ref 5–34)
Anion Gap: 10 mEq/L (ref 3–11)
BUN: 24.7 mg/dL (ref 7.0–26.0)
CO2: 25 mEq/L (ref 22–29)
Calcium: 9.3 mg/dL (ref 8.4–10.4)
Chloride: 109 mEq/L (ref 98–109)
Creatinine: 1 mg/dL (ref 0.6–1.1)
EGFR: 59 mL/min/{1.73_m2} — ABNORMAL LOW (ref >90)
Glucose: 115 mg/dl (ref 70–140)
POTASSIUM: 3.6 meq/L (ref 3.5–5.1)
SODIUM: 143 meq/L (ref 136–145)
TOTAL PROTEIN: 6.7 g/dL (ref 6.4–8.3)
Total Bilirubin: 0.44 mg/dL (ref 0.20–1.20)

## 2015-01-03 LAB — CBC WITH DIFFERENTIAL/PLATELET
BASO%: 0.9 % (ref 0.0–2.0)
BASOS ABS: 0 10*3/uL (ref 0.0–0.1)
EOS%: 2.4 % (ref 0.0–7.0)
Eosinophils Absolute: 0.1 10*3/uL (ref 0.0–0.5)
HEMATOCRIT: 36.5 % (ref 34.8–46.6)
HEMOGLOBIN: 12.4 g/dL (ref 11.6–15.9)
LYMPH%: 27.2 % (ref 14.0–49.7)
MCH: 29.9 pg (ref 25.1–34.0)
MCHC: 34 g/dL (ref 31.5–36.0)
MCV: 87.8 fL (ref 79.5–101.0)
MONO#: 0.4 10*3/uL (ref 0.1–0.9)
MONO%: 7.1 % (ref 0.0–14.0)
NEUT%: 62.4 % (ref 38.4–76.8)
NEUTROS ABS: 3.5 10*3/uL (ref 1.5–6.5)
Platelets: 250 10*3/uL (ref 145–400)
RBC: 4.15 10*6/uL (ref 3.70–5.45)
RDW: 13.2 % (ref 11.2–14.5)
WBC: 5.7 10*3/uL (ref 3.9–10.3)
lymph#: 1.5 10*3/uL (ref 0.9–3.3)

## 2015-01-03 MED ORDER — OXYCODONE-ACETAMINOPHEN 5-325 MG PO TABS: 1.0000 | ORAL_TABLET | Freq: Four times a day (QID) | ORAL | Status: DC | PRN

## 2015-01-03 NOTE — Progress Notes (Signed)
Checked in new pt with no financial concerns.  Pt has my card for any billing questions or concerns.

## 2015-01-03 NOTE — Telephone Encounter (Signed)
Pt confirmed MD visit per 05/31 POF, gave pt AVS and Calendar..... KJ

## 2015-01-03 NOTE — Progress Notes (Signed)
[facility] [phone]   [phone]  CONSULT NOTE  REFERRING PHYSICIAN: Dr. Berle Mull  REASON FOR CONSULTATION:  64 years old white female with suspicious bone metastasis.  HPI Laura Walter is a 64 y.o. female was past medical history significant for recent back pain as well as few episodes of hypoglycemia. The patient is a never smoker. She has been doing well until middle of March 2016 when she started complaining of back pain started initially in the upper back then in the lower back with radiation to the right leg. She was seen by Dr. Alfonso Ramus, an orthopedic surgeon for evaluation and she received a course of steroids. She has initial improvement in her symptoms but it recurs again after stopping the steroids. Dr. 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 The right L1 lesion is expansile with no definite epidural tumor. These findings were concerning for metastatic disease from breast, lung, renal, thyroid or myeloma metastatic disease. There was also mild right sided impingement of illness 3-4 due to disc bulge and bony expansion from the vertebral body tumor. Dr. Alfonso Ramus kindly referred the patient to me today for evaluation and recommendation regarding these findings. Her last mammogram was performed in February 2015. It was unremarkable. When seen today the patient is feeling fine except for the back pain and she currently takes Advil for pain management. She also has a course of bronchitis 2-3 weeks ago with dry cough and no hemoptysis. She felt better after receiving a course of antibiotics. She denied having any significant shortness of breath. The patient also complains of fatigue but no significant weight loss or night sweats. She denied  having any nausea or vomiting. Family history significant for a father who died from heart attack. Mother has dementia and still alive at age 62. The patient is single but has a female partner named [name] who was available at the visit today. She is to work for the [employer] system. She has no history of smoking, alcohol or drug abuse. HPI  Past Medical History  Diagnosis Date  . Pneumonia 2009 & 2011  . Allergy     seasonal  . H/O reactive hypoglycemia   . Osteopenia   . IBS (irritable bowel syndrome)     Past Surgical History  Procedure Laterality Date  . Colonoscopy  2009    Negative; [facility]  . Tonsillectomy and adenoidectomy    . Wisdom tooth extraction      Family History  Problem Relation Age of Onset  . Transient ischemic attack Mother   . Mental illness Mother     Dementia  . Diabetes Father   . Heart attack Father 51  . Stomach cancer Maternal Aunt   . Heart attack Maternal Aunt     in 69s  . Diabetes Paternal Uncle     X 3  . Heart attack Maternal Grandfather     in 31s    Social History History  Substance Use Topics  . Smoking status: Never Smoker   . Smokeless tobacco: Never Used  . Alcohol Use: Yes     Comment: occasionally     Allergies  Allergen Reactions  . Sulfonamide Derivatives     ? Rash as child    Current  Outpatient Prescriptions  Medication Sig Dispense Refill  . oxyCODONE-acetaminophen (PERCOCET/ROXICET) 5-325 MG per tablet Take 1 tablet by mouth every 6 (six) hours as needed for severe pain. 40 tablet 0  . scopolamine (TRANSDERM-SCOP) 1.5 MG Place 1 patch (1.5 mg total) onto the skin every 3 (three) days. (Patient not taking: Reported on 01/03/2015) 2 patch 0   No current facility-administered medications for this visit.    Review of Systems  Constitutional: positive for fatigue Eyes: negative Ears, nose, mouth, throat, and face: negative Respiratory: positive for cough Cardiovascular:  negative Gastrointestinal: negative Genitourinary:negative Integument/breast: negative Hematologic/lymphatic: negative Musculoskeletal:positive for back pain Neurological: negative Behavioral/Psych: negative Endocrine: negative Allergic/Immunologic: negative  Physical Exam  EFE:OFHQR, healthy, no distress, well nourished, well developed and anxious SKIN: skin color, texture, turgor are normal, no rashes or significant lesions HEAD: Normocephalic, No masses, lesions, tenderness or abnormalities EYES: normal, PERRLA EARS: External ears normal, Canals clear OROPHARYNX:no exudate, no erythema and lips, buccal mucosa, and tongue normal  NECK: supple, no adenopathy, no JVD LYMPH:  no palpable lymphadenopathy, no hepatosplenomegaly BREAST:not examined LUNGS: clear to auscultation , and palpation HEART: regular rate & rhythm, no murmurs and no gallops ABDOMEN:abdomen soft, non-tender, normal bowel sounds and no masses or organomegaly BACK: Back symmetric, no curvature., No CVA tenderness EXTREMITIES:no joint deformities, effusion, or inflammation, no edema, no skin discoloration  NEURO: alert & oriented x 3 with fluent speech, no focal motor/sensory deficits  PERFORMANCE STATUS: ECOG 1  LABORATORY DATA: Lab Results  Component Value Date   WBC 5.7 01/03/2015   HGB 12.4 01/03/2015   HCT 36.5 01/03/2015   MCV 87.8 01/03/2015   PLT 250 01/03/2015      Chemistry      Component Value Date/Time   NA 143 01/03/2015 1342   NA 140 10/13/2014 0941   K 3.6 01/03/2015 1342   K 4.2 10/13/2014 0941   CL 108 10/13/2014 0941   CO2 25 01/03/2015 1342   CO2 28 10/13/2014 0941   BUN 24.7 01/03/2015 1342   BUN 22 10/13/2014 0941   CREATININE 1.0 01/03/2015 1342   CREATININE 0.98 10/13/2014 0941   CREATININE 0.83 11/02/2013 0832      Component Value Date/Time   CALCIUM 9.3 01/03/2015 1342   CALCIUM 9.5 10/13/2014 0941   ALKPHOS 90 01/03/2015 1342   ALKPHOS 63 10/13/2014 0941   AST 15  01/03/2015 1342   AST 14 10/13/2014 0941   ALT 10 01/03/2015 1342   ALT 12 10/13/2014 0941   BILITOT 0.44 01/03/2015 1342   BILITOT 0.6 10/13/2014 0941       RADIOGRAPHIC STUDIES: No results found.  ASSESSMENT: This is a very pleasant 64 years old white female with suspicious bone lesion concerning for metastatic disease versus multiple myeloma.   PLAN: I had a lengthy discussion with the patient and her friends today about her current condition and further investigation to evaluate the etiology of the bone lesions. I ordered a myeloma panel today to rule out the possibility of multiple myeloma. I also order CT scan of the chest, abdomen and pelvis to rule out any primary etiology for the metastatic lesion from the lung, renal or other solid malignancy. I strongly recommended for the patient to schedule her mammogram to be performed in the next 1-2 weeks to rule out the possibility of breast cancer. Once the results of the myeloma panel and the CT scan of the chest, abdomen and pelvis are available, I may consider the patient for a biopsy  of the most accessible lesion. For pain management, I started the patient on Percocet 5/325 mg by mouth every 6 hours as needed for pain. The patient would come back for follow-up visit in 10 days for reevaluation and more detailed discussion of her treatment options based on the final imaging and lab results. I gave the patient and her friends the time to ask questions and I answered them completely to their satisfaction. The patient was advised to call immediately if she has any concerning symptoms in the interval. The patient voices understanding of current disease status and treatment options and is in agreement with the current care plan.  All questions were answered. The patient knows to call the clinic with any problems, questions or concerns. We can certainly see the patient much sooner if necessary.  Thank you so much for allowing me to  participate in the care of Laura Walter. I will continue to follow up the patient with you and assist in her care.  I spent 40 minutes counseling the patient face to face. The total time spent in the appointment was 60 minutes.  Disclaimer: This note was dictated with voice recognition software. Similar sounding words can inadvertently be transcribed and may not be corrected upon review.   Jannette Cotham K. Jan 03, 2015, 3:26 PM

## 2015-01-04 ENCOUNTER — Telehealth: Payer: Self-pay | Admitting: Internal Medicine

## 2015-01-04 ENCOUNTER — Other Ambulatory Visit: Payer: Self-pay | Admitting: Internal Medicine

## 2015-01-04 NOTE — Telephone Encounter (Signed)
s.w. pt and advised on added lab tomorrow....pt ok and aware

## 2015-01-05 ENCOUNTER — Ambulatory Visit (HOSPITAL_COMMUNITY)
Admission: RE | Admit: 2015-01-05 | Discharge: 2015-01-05 | Disposition: A | Discharge status: Home / Self Care | Payer: BC Managed Care – PPO | Source: Ambulatory Visit | Attending: Internal Medicine | Admitting: Internal Medicine

## 2015-01-05 ENCOUNTER — Other Ambulatory Visit (HOSPITAL_BASED_OUTPATIENT_CLINIC_OR_DEPARTMENT_OTHER): Payer: BC Managed Care – PPO

## 2015-01-05 ENCOUNTER — Other Ambulatory Visit: Payer: Self-pay | Admitting: Internal Medicine

## 2015-01-05 ENCOUNTER — Encounter (HOSPITAL_COMMUNITY): Payer: Self-pay

## 2015-01-05 ENCOUNTER — Encounter: Payer: Self-pay | Admitting: *Deleted

## 2015-01-05 DIAGNOSIS — R590 Localized enlarged lymph nodes: Secondary | ICD-10-CM | POA: Diagnosis not present

## 2015-01-05 DIAGNOSIS — M899 Disorder of bone, unspecified: Secondary | ICD-10-CM

## 2015-01-05 DIAGNOSIS — I313 Pericardial effusion (noninflammatory): Secondary | ICD-10-CM | POA: Diagnosis not present

## 2015-01-05 DIAGNOSIS — C3411 Malignant neoplasm of upper lobe, right bronchus or lung: Secondary | ICD-10-CM | POA: Insufficient documentation

## 2015-01-05 DIAGNOSIS — D759 Disease of blood and blood-forming organs, unspecified: Secondary | ICD-10-CM

## 2015-01-05 DIAGNOSIS — K589 Irritable bowel syndrome without diarrhea: Secondary | ICD-10-CM | POA: Insufficient documentation

## 2015-01-05 DIAGNOSIS — J90 Pleural effusion, not elsewhere classified: Secondary | ICD-10-CM | POA: Diagnosis not present

## 2015-01-05 DIAGNOSIS — C7951 Secondary malignant neoplasm of bone: Secondary | ICD-10-CM | POA: Insufficient documentation

## 2015-01-05 DIAGNOSIS — C3491 Malignant neoplasm of unspecified part of right bronchus or lung: Secondary | ICD-10-CM

## 2015-01-05 MED ORDER — IOHEXOL 300 MG/ML  SOLN
100.0000 mL | Freq: Once | INTRAMUSCULAR | Status: AC | PRN
  Administered 2015-01-05: 100 mL via INTRAVENOUS

## 2015-01-05 NOTE — Progress Notes (Signed)
Oncology Nurse Navigator Documentation  Oncology Nurse Navigator Flowsheets 01/05/2015  Referral date to RadOnc/MedOnc 01/03/2015  Navigator Encounter Type Other. Spoke with patient during first visit with Dr. Julien Nordmann.  She does not have any pathology and was updated on next steps.  Dr. Julien Nordmann asked that I call her to tell her he will order more scans due to something abnormal was seen on CT Chest.  I called and spoke with her.  I gave her the update.  I asked if she had questions and she stated not at this time.  I asked if someone was with her and she stated no.  I asked if she was ok or can I call someone for her.  She stated she was fine and will wait for scheduler to call with appt.   Time Spent with Patient 30

## 2015-01-09 ENCOUNTER — Encounter: Payer: Self-pay | Admitting: Gynecology

## 2015-01-09 LAB — SPEP & IFE WITH QIG
ALBUMIN ELP: 3.5 g/dL — AB (ref 3.8–4.8)
Alpha-1-Globulin: 0.4 g/dL — ABNORMAL HIGH (ref 0.2–0.3)
Alpha-2-Globulin: 1 g/dL — ABNORMAL HIGH (ref 0.5–0.9)
Beta 2: 0.4 g/dL (ref 0.2–0.5)
Beta Globulin: 0.4 g/dL (ref 0.4–0.6)
Gamma Globulin: 0.9 g/dL (ref 0.8–1.7)
IGG (IMMUNOGLOBIN G), SERUM: 1050 mg/dL (ref 690–1700)
IgA: 253 mg/dL (ref 69–380)
IgM, Serum: 115 mg/dL (ref 52–322)
TOTAL PROTEIN, SERUM ELECTROPHOR: 6.5 g/dL (ref 6.1–8.1)

## 2015-01-09 LAB — KAPPA/LAMBDA LIGHT CHAINS
KAPPA FREE LGHT CHN: 2.85 mg/dL — AB (ref 0.33–1.94)
KAPPA LAMBDA RATIO: 1.76 — AB (ref 0.26–1.65)
LAMBDA FREE LGHT CHN: 1.62 mg/dL (ref 0.57–2.63)

## 2015-01-10 ENCOUNTER — Encounter: Payer: Self-pay | Admitting: *Deleted

## 2015-01-10 ENCOUNTER — Encounter (HOSPITAL_COMMUNITY)
Admission: RE | Admit: 2015-01-10 | Discharge: 2015-01-10 | Disposition: A | Discharge status: Home / Self Care | Payer: BC Managed Care – PPO | Source: Ambulatory Visit | Attending: Internal Medicine | Admitting: Internal Medicine

## 2015-01-10 DIAGNOSIS — C3491 Malignant neoplasm of unspecified part of right bronchus or lung: Secondary | ICD-10-CM | POA: Diagnosis not present

## 2015-01-10 LAB — GLUCOSE, CAPILLARY: Glucose-Capillary: 89 mg/dL (ref 65–99)

## 2015-01-10 MED ORDER — FLUDEOXYGLUCOSE F - 18 (FDG) INJECTION
7.7500 | Freq: Once | INTRAVENOUS | Status: AC | PRN
  Administered 2015-01-10: 7.75 via INTRAVENOUS

## 2015-01-10 NOTE — Progress Notes (Signed)
Oncology Nurse Navigator Documentation  Oncology Nurse Navigator Flowsheets 01/10/2015  Referral date to RadOnc/MedOnc    Navigator Encounter Type Other/Dr. Julien Nordmann notified me that PET scan has been completed.  He asked that I call Radiology to expedite biopsy.  I called radiology scheduling and left vm message to call.   Coordination of Care Radiology  Time Spent with Patient 30

## 2015-01-10 NOTE — Progress Notes (Signed)
Oncology Nurse Navigator Documentation  Oncology Nurse Navigator Flowsheets 01/10/2015  Referral date to RadOnc/MedOnc   Navigator Encounter Type Other/Coordination of care  Treatment Phase Other/Pre-Treatment  Coordination of Care Radiology/I received a call from the radiology dept.  Patients biopsy is in review and when it has been approved, the radiologist team will call patient.   Time Spent with Patient 15

## 2015-01-11 ENCOUNTER — Other Ambulatory Visit: Payer: Self-pay | Admitting: Radiology

## 2015-01-12 ENCOUNTER — Ambulatory Visit (HOSPITAL_COMMUNITY): Admission: RE | Admit: 2015-01-12 | Payer: BC Managed Care – PPO | Source: Ambulatory Visit

## 2015-01-12 ENCOUNTER — Inpatient Hospital Stay (HOSPITAL_COMMUNITY): Admission: RE | Admit: 2015-01-12 | Payer: BC Managed Care – PPO | Source: Ambulatory Visit

## 2015-01-12 ENCOUNTER — Encounter (HOSPITAL_COMMUNITY): Payer: Self-pay

## 2015-01-12 ENCOUNTER — Ambulatory Visit (HOSPITAL_COMMUNITY)
Admission: RE | Admit: 2015-01-12 | Discharge: 2015-01-12 | Disposition: A | Discharge status: Home / Self Care | Payer: BC Managed Care – PPO | Source: Ambulatory Visit | Attending: Internal Medicine | Admitting: Internal Medicine

## 2015-01-12 DIAGNOSIS — C7951 Secondary malignant neoplasm of bone: Secondary | ICD-10-CM | POA: Diagnosis not present

## 2015-01-12 DIAGNOSIS — C7931 Secondary malignant neoplasm of brain: Secondary | ICD-10-CM | POA: Insufficient documentation

## 2015-01-12 DIAGNOSIS — M549 Dorsalgia, unspecified: Secondary | ICD-10-CM | POA: Insufficient documentation

## 2015-01-12 DIAGNOSIS — M8580 Other specified disorders of bone density and structure, unspecified site: Secondary | ICD-10-CM | POA: Insufficient documentation

## 2015-01-12 DIAGNOSIS — R918 Other nonspecific abnormal finding of lung field: Secondary | ICD-10-CM

## 2015-01-12 DIAGNOSIS — K589 Irritable bowel syndrome without diarrhea: Secondary | ICD-10-CM | POA: Diagnosis not present

## 2015-01-12 DIAGNOSIS — M899 Disorder of bone, unspecified: Secondary | ICD-10-CM | POA: Diagnosis present

## 2015-01-12 DIAGNOSIS — C801 Malignant (primary) neoplasm, unspecified: Secondary | ICD-10-CM | POA: Diagnosis not present

## 2015-01-12 DIAGNOSIS — C3491 Malignant neoplasm of unspecified part of right bronchus or lung: Secondary | ICD-10-CM

## 2015-01-12 LAB — CBC
HCT: 38.5 % (ref 36.0–46.0)
HEMOGLOBIN: 12.8 g/dL (ref 12.0–15.0)
MCH: 29.4 pg (ref 26.0–34.0)
MCHC: 33.2 g/dL (ref 30.0–36.0)
MCV: 88.5 fL (ref 78.0–100.0)
Platelets: 198 10*3/uL (ref 150–400)
RBC: 4.35 MIL/uL (ref 3.87–5.11)
RDW: 12.8 % (ref 11.5–15.5)
WBC: 6.5 10*3/uL (ref 4.0–10.5)

## 2015-01-12 LAB — PROTIME-INR
INR: 1.14 (ref 0.00–1.49)
Prothrombin Time: 14.8 seconds (ref 11.6–15.2)

## 2015-01-12 LAB — APTT: aPTT: 27 seconds (ref 24–37)

## 2015-01-12 MED ORDER — FENTANYL CITRATE (PF) 100 MCG/2ML IJ SOLN
INTRAMUSCULAR | Status: AC
  Filled 2015-01-12: qty 2

## 2015-01-12 MED ORDER — FENTANYL CITRATE (PF) 100 MCG/2ML IJ SOLN
INTRAMUSCULAR | Status: AC | PRN
  Administered 2015-01-12: 50 ug via INTRAVENOUS
  Administered 2015-01-12: 25 ug via INTRAVENOUS

## 2015-01-12 MED ORDER — MIDAZOLAM HCL 2 MG/2ML IJ SOLN
INTRAMUSCULAR | Status: AC | PRN
  Administered 2015-01-12: 0.5 mg via INTRAVENOUS
  Administered 2015-01-12: 1 mg via INTRAVENOUS

## 2015-01-12 MED ORDER — MIDAZOLAM HCL 2 MG/2ML IJ SOLN
INTRAMUSCULAR | Status: AC
  Filled 2015-01-12: qty 2

## 2015-01-12 MED ORDER — SODIUM CHLORIDE 0.9 % IV SOLN: Freq: Once | INTRAVENOUS | Status: DC

## 2015-01-12 MED ORDER — LIDOCAINE-EPINEPHRINE 1 %-1:100000 IJ SOLN
INTRAMUSCULAR | Status: AC
  Filled 2015-01-12: qty 1

## 2015-01-12 NOTE — Sedation Documentation (Signed)
Patient c/o pain. meds administered

## 2015-01-12 NOTE — H&P (Signed)
Chief Complaint: "I am here for a biopsy."  Referring Physician(s): Mohamed,Mohamed  History of Present Illness: Laura Walter is a 64 y.o. female with c/o back pain since March/2016, she has seen an orthopedic and had a steroid trial with no relief. MRI and CT imaging revealed lytic metastatic bone lesions and RUL lung mass. PET revealed hypermetabolism in these areas, she has been by Dr. Julien Nordmann and scheduled for a biopsy today. She c/o ongoing back pain. She denies any chest pain, shortness of breath or palpitations. She denies any active signs of bleeding or excessive bruising. She denies any recent fever or chills. The patient denies any history of sleep apnea or chronic oxygen use. She has previously tolerated sedation without complications.   Past Medical History  Diagnosis Date  . Pneumonia 2009 & 2011  . Allergy     seasonal  . H/O reactive hypoglycemia   . Osteopenia   . IBS (irritable bowel syndrome)    Past Surgical History  Procedure Laterality Date  . Colonoscopy  2009    Negative; [facility]  . Tonsillectomy and adenoidectomy    . Wisdom tooth extraction     Allergies: Sulfonamide derivatives  Medications: Prior to Admission medications   Medication Sig Start Date End Date Taking? Authorizing Provider  Dextromethorphan HBr 15 MG/5ML LIQD Take 5 mLs by mouth every 5 (five) hours as needed (cough).   Yes Historical Provider, MD  ibuprofen (ADVIL,MOTRIN) 200 MG tablet Take 200 mg by mouth every 6 (six) hours as needed for moderate pain.   Yes Historical Provider, MD  scopolamine (TRANSDERM-SCOP) 1.5 MG Place 1 patch (1.5 mg total) onto the skin every 3 (three) days. 10/08/13  Yes Hendricks Limes, MD  oxyCODONE-acetaminophen (PERCOCET/ROXICET) 5-325 MG per tablet Take 1 tablet by mouth every 6 (six) hours as needed for severe pain. Patient not taking: Reported on 01/10/2015 01/03/15   Curt Bears, MD     Family History  Problem Relation Age of Onset  . Transient  ischemic attack Mother   . Mental illness Mother     Dementia  . Diabetes Father   . Heart attack Father 68  . Stomach cancer Maternal Aunt   . Heart attack Maternal Aunt     in 109s  . Diabetes Paternal Uncle     X 3  . Heart attack Maternal Grandfather     in 65s    History   Social History  . Marital Status: Single    Spouse Name: N/A  . Number of Children: N/A  . Years of Education: N/A   Social History Main Topics  . Smoking status: Never Smoker   . Smokeless tobacco: Never Used  . Alcohol Use: Yes     Comment: occasionally   . Drug Use: No  . Sexual Activity: Not on file   Other Topics Concern  . None   Social History Narrative   Review of Systems: A 12 point ROS discussed and pertinent positives are indicated in the HPI above.  All other systems are negative.  Review of Systems  Vital Signs: BP 174/91 mmHg  Pulse 81  Temp(Src) 98.6 F (37 C)  Resp 20  Ht '5\' 7"'$  (1.702 m)  Wt 154 lb (69.854 kg)  BMI 24.11 kg/m2  SpO2 99%  Physical Exam  Constitutional: She is oriented to person, place, and time. No distress.  HENT:  Head: Normocephalic and atraumatic.  Neck: No tracheal deviation present.  Cardiovascular: Normal rate and regular  rhythm.  Exam reveals no gallop and no friction rub.   No murmur heard. Pulmonary/Chest: Effort normal and breath sounds normal. No respiratory distress. She has no wheezes. She has no rales.  Abdominal: Soft. Bowel sounds are normal. She exhibits no distension.  Musculoskeletal: She exhibits tenderness.  Neurological: She is alert and oriented to person, place, and time.  Skin: She is not diaphoretic.  Psychiatric: She has a normal mood and affect. Her behavior is normal. Thought content normal.    Mallampati Score:  MD Evaluation Airway: WNL Heart: WNL Abdomen: WNL Chest/ Lungs: WNL ASA  Classification: 2 Mallampati/Airway Score: Two  Imaging: Ct Chest W Contrast  01/05/2015   CLINICAL DATA:  Osseous metastases  on recent MRI lumbar spine, history irritable bowel syndrome  EXAM: CT CHEST, ABDOMEN, AND PELVIS WITH CONTRAST  TECHNIQUE: Multidetector CT imaging of the chest, abdomen and pelvis was performed following the standard protocol during bolus administration of intravenous contrast. Sagittal and coronal MPR images reconstructed from axial data set.  CONTRAST:  164m OMNIPAQUE IOHEXOL 300 MG/ML SOLN IV. Dilute oral contrast.  COMPARISON:  None  FINDINGS: CT CHEST FINDINGS  Aorta normal caliber.  Thoracic vascular structures grossly patent on nondedicated exam.  Small to moderate-sized pericardial effusion.  Enlarged thoracic nodes including:  Subcarinal level 7 node 12 mm short axis image 28.  RIGHT hilar 10R node 15 mm short axis image 26.  RIGHT hilar 10R node 14 mm short axis image 25.  11 mm precarinal node level 4.  Additional smaller sub carinal and RIGHT hilar nodes.  Upper paratracheal level 2 node 8 mm short axis image 18.  Mass identified posterior segment RIGHT upper lobe 4.8 x 4.1 x 4.4 cm image 21 like representing a primary pulmonary neoplasm.  Mass abuts pleural surface laterally with associated RIGHT upper lobe atelectasis superiorly.  Additional soft tissue extends to RIGHT hilum question atelectasis versus tumor extension.  Associated peribronchial thickening in RIGHT upper lobe and scattered interstitial thickening.  Tiny RIGHT upper lobe nodule 3 mm diameter image 24.  RIGHT pleural effusion with compressive atelectasis of posterior RIGHT lung.  Focal airspace infiltrate RIGHT middle lobe.  LEFT lung clear.  No pneumothorax.  Nonspecific small sclerotic focus posterior LEFT fifth rib.  Lytic destructive lesion with minimal superior endplate height loss at T9 vertebral body.  Probable lytic lesion posterior RIGHT fifth rib.  CT ABDOMEN AND PELVIS FINDINGS  Two hepatic cysts largest lateral segment LEFT lobe 3.7 x 2.8 x 3.5 cm.  Remainder of liver, spleen, pancreas, kidneys, and adrenal glands normal.   Normal appendix, bladder, uterus, and ovaries.  Stomach and bowel loops normal appearance.  No mass, adenopathy, free air, free fluid or hernia.  Lytic metastatic lesions within L2 and L3 vertebral bodies as well as in LEFT ischium.  Abnormal soft tissue/tumor extends into the RIGHT ventral aspect with spinal canal at L3 and into the RIGHT L3-L4 neural foramen, question exerting mass effect upon the RIGHT L3 and L4 roots.  IMPRESSION: 4.8 x 4.1 x 4.4 cm diameter soft tissue mass posterior segment RIGHT upper lobe likely representing a primary pulmonary neoplasm.  Associated RIGHT hilar and mediastinal adenopathy.  Small to moderate pericardial effusion.  Small RIGHT pleural effusion.  3 mm RIGHT upper lobe nodule.  Osseous metastases as above.  RIGHT middle lobe airspace infiltrate.   Electronically Signed   By: MLavonia DanaM.D.   On: 01/05/2015 09:30   Ct Abdomen Pelvis W Contrast  01/05/2015  CLINICAL DATA:  Osseous metastases on recent MRI lumbar spine, history irritable bowel syndrome  EXAM: CT CHEST, ABDOMEN, AND PELVIS WITH CONTRAST  TECHNIQUE: Multidetector CT imaging of the chest, abdomen and pelvis was performed following the standard protocol during bolus administration of intravenous contrast. Sagittal and coronal MPR images reconstructed from axial data set.  CONTRAST:  172m OMNIPAQUE IOHEXOL 300 MG/ML SOLN IV. Dilute oral contrast.  COMPARISON:  None  FINDINGS: CT CHEST FINDINGS  Aorta normal caliber.  Thoracic vascular structures grossly patent on nondedicated exam.  Small to moderate-sized pericardial effusion.  Enlarged thoracic nodes including:  Subcarinal level 7 node 12 mm short axis image 28.  RIGHT hilar 10R node 15 mm short axis image 26.  RIGHT hilar 10R node 14 mm short axis image 25.  11 mm precarinal node level 4.  Additional smaller sub carinal and RIGHT hilar nodes.  Upper paratracheal level 2 node 8 mm short axis image 18.  Mass identified posterior segment RIGHT upper lobe 4.8 x 4.1  x 4.4 cm image 21 like representing a primary pulmonary neoplasm.  Mass abuts pleural surface laterally with associated RIGHT upper lobe atelectasis superiorly.  Additional soft tissue extends to RIGHT hilum question atelectasis versus tumor extension.  Associated peribronchial thickening in RIGHT upper lobe and scattered interstitial thickening.  Tiny RIGHT upper lobe nodule 3 mm diameter image 24.  RIGHT pleural effusion with compressive atelectasis of posterior RIGHT lung.  Focal airspace infiltrate RIGHT middle lobe.  LEFT lung clear.  No pneumothorax.  Nonspecific small sclerotic focus posterior LEFT fifth rib.  Lytic destructive lesion with minimal superior endplate height loss at T9 vertebral body.  Probable lytic lesion posterior RIGHT fifth rib.  CT ABDOMEN AND PELVIS FINDINGS  Two hepatic cysts largest lateral segment LEFT lobe 3.7 x 2.8 x 3.5 cm.  Remainder of liver, spleen, pancreas, kidneys, and adrenal glands normal.  Normal appendix, bladder, uterus, and ovaries.  Stomach and bowel loops normal appearance.  No mass, adenopathy, free air, free fluid or hernia.  Lytic metastatic lesions within L2 and L3 vertebral bodies as well as in LEFT ischium.  Abnormal soft tissue/tumor extends into the RIGHT ventral aspect with spinal canal at L3 and into the RIGHT L3-L4 neural foramen, question exerting mass effect upon the RIGHT L3 and L4 roots.  IMPRESSION: 4.8 x 4.1 x 4.4 cm diameter soft tissue mass posterior segment RIGHT upper lobe likely representing a primary pulmonary neoplasm.  Associated RIGHT hilar and mediastinal adenopathy.  Small to moderate pericardial effusion.  Small RIGHT pleural effusion.  3 mm RIGHT upper lobe nodule.  Osseous metastases as above.  RIGHT middle lobe airspace infiltrate.   Electronically Signed   By: MLavonia DanaM.D.   On: 01/05/2015 09:30   Nm Pet Image Initial (pi) Skull Base To Thigh  01/10/2015   CLINICAL DATA:  Initial Treatment strategy for Lung cancer.  EXAM: NUCLEAR  MEDICINE PET SKULL BASE TO THIGH  TECHNIQUE: 7.75 mCi F-18 FDG was injected intravenously. Full-ring PET imaging was performed from the skull base to thigh after the radiotracer. CT data was obtained and used for attenuation correction and anatomic localization.  FASTING BLOOD GLUCOSE:  Value: 89 mg/dl  COMPARISON:  None.  FINDINGS: NECK  No hypermetabolic lymph nodes in the neck.  CHEST  Hypermetabolic mass within the right upper lobe measures 4 x 3.7 cm and has an SUV max equal to 14.5. There is a small to moderate right pleural effusion. There is hypermetabolic right hilar,  bilateral mediastinal, and left supraclavicular adenopathy. The right hilar lymph node mass measures approximately 3.8 cm and has an SUV max equal to 15.7. Hypermetabolic left paratracheal lymph node measures 1 cm and has an SUV max equal to 7.07. Left supraclavicular lymph node measures 9 mm and has an SUV max equal to 11.9.  ABDOMEN/PELVIS  No abnormal hypermetabolic activity within the liver, pancreas, adrenal glands, or spleen. No hypermetabolic lymph nodes in the abdomen or pelvis.  SKELETON  Extensive multi focal hypermetabolic bone metastases identified. Index lytic lesion involving the left first rib measures approximate 1.9 cm and has an SUV max equal to 8.7. Large destructive lesion involving the T9 vertebra measures 2.5 cm and has an SUV max equal to 14.5. Destructive bone lesion involving the L3 vertebra measures 3.5 cm and has an SUV max equal to 14.7. Hypermetabolic lytic lesions are noted involving the posterior column of the left acetabulum as well as the left inferior pubic rami.  IMPRESSION: 1. Right upper lobe lung mass is intensely hypermetabolic compatible with primary lung neoplasm. 2. Hypermetabolic ipsilateral mediastinal hypermetabolic adenopathy as well as bilateral mediastinal adenopathy and left supraclavicular adenopathy. 3. Multi focal lytic bone metastasis are identified involving the ribs, spine and bony pelvis.    Electronically Signed   By: Kerby Moors M.D.   On: 01/10/2015 09:47    Labs:  CBC:  Recent Labs  10/13/14 0941 01/03/15 1342  WBC 5.6 5.7  HGB 12.9 12.4  HCT 38.4 36.5  PLT 215.0 250    COAGS: No results for input(s): INR, APTT in the last 8760 hours.  BMP:  Recent Labs  10/13/14 0941 01/03/15 1342  NA 140 143  K 4.2 3.6  CL 108  --   CO2 28 25  GLUCOSE 95 115  BUN 22 24.7  CALCIUM 9.5 9.3  CREATININE 0.98 1.0    LIVER FUNCTION TESTS:  Recent Labs  10/13/14 0941 01/03/15 1342  BILITOT 0.6 0.44  AST 14 15  ALT 12 10  ALKPHOS 63 90  PROT 7.1 6.7  ALBUMIN 4.3 3.7    Assessment and Plan: Back pain, MRI with osseous metastatic disease  CT imaging and PET with hypermetabolic RUL lung mass and lytic bone lesions Seen by Dr. Julien Nordmann  Scheduled today for image guided biopsy of most amendable lesion Images reviewed with Dr. Pascal Lux and will biopsy L3 bone lesion with moderate sedation The patient has been NPO, no blood thinners taken, labs and vitals have been reviewed. Risks and Benefits discussed with the patient including, but not limited to bleeding, infection, damage to adjacent structures or low yield requiring additional tests. All of the patient's questions were answered, patient is agreeable to proceed. Consent signed and in chart.   Thank you for this interesting consult.  I greatly enjoyed meeting MIRAI GREENWOOD and look forward to participating in their care.  SignedHedy Jacob 01/12/2015, 10:36 AM   I spent a total of 30 minutes in face to face in clinical consultation, greater than 50% of which was counseling/coordinating care for RUL lung lesion and L3 bone lesion.

## 2015-01-12 NOTE — Sedation Documentation (Signed)
Patient is resting comfortably. 

## 2015-01-12 NOTE — Sedation Documentation (Signed)
Pt vitals stable, ok to transfer to SS, report called. 95% RA

## 2015-01-12 NOTE — Procedures (Signed)
Technically successful CT guided biopsy of lytic lesion involving the L3 vertebral body.  No immediate post procedural complications.

## 2015-01-12 NOTE — Discharge Instructions (Signed)
Needle Biopsy °Care After °These instructions give you information on caring for yourself after your procedure. Your doctor may also give you more specific instructions. Call your doctor if you have any problems or questions after your procedure. °HOME CARE °· Rest for 4 hours after your biopsy, except for getting up to go to the bathroom or as told. °· Keep the places where the needles were put in clean and dry. °¨ Do not put powder or lotion on the sites. °¨ Do not shower until 24 hours after the test. Remove all bandages (dressings) before showering. °¨ Remove all bandages at least once every day. Gently clean the sites with soap and water. Keep putting a new bandage on until the skin is closed. °Finding out the results of your test °Ask your doctor when your test results will be ready. Make sure you follow up and get the test results. °GET HELP RIGHT AWAY IF:  °· You have shortness of breath or trouble breathing. °· You have pain or cramping in your belly (abdomen). °· You feel sick to your stomach (nauseous) or throw up (vomit). °· Any of the places where the needles were put in: °¨ Are puffy (swollen) or red. °¨ Are sore or hot to the touch. °¨ Are draining yellowish-white fluid (pus). °¨ Are bleeding after 10 minutes of pressing down on the site. Have someone keep pressing on any place that is bleeding until you see a doctor. °· You have any unusual pain that will not stop. °· You have a fever. °If you go to the emergency room, tell the nurse that you had a biopsy. Take this paper with you to show the nurse. °MAKE SURE YOU:  °· Understand these instructions. °· Will watch your condition. °· Will get help right away if you are not doing well or get worse. °Document Released: 07/04/2008 Document Revised: 10/14/2011 Document Reviewed: 07/04/2008 °ExitCare® Patient Information ©2015 [employer]. This information is not intended to replace advice given to you by your health care provider. Make sure you discuss  any questions you have with your health care provider. ° °

## 2015-01-12 NOTE — Progress Notes (Signed)
Small amt bleeding noted at biopsy site and small amt swelling noted around biopsy site; [place] notified and in to see client and ok to d/c home per [facility]

## 2015-01-13 ENCOUNTER — Ambulatory Visit (HOSPITAL_BASED_OUTPATIENT_CLINIC_OR_DEPARTMENT_OTHER): Payer: BC Managed Care – PPO | Admitting: Internal Medicine

## 2015-01-13 ENCOUNTER — Encounter: Payer: Self-pay | Admitting: *Deleted

## 2015-01-13 ENCOUNTER — Ambulatory Visit (HOSPITAL_COMMUNITY)
Admission: RE | Admit: 2015-01-13 | Discharge: 2015-01-13 | Disposition: A | Discharge status: Home / Self Care | Payer: BC Managed Care – PPO | Source: Ambulatory Visit | Attending: Internal Medicine | Admitting: Internal Medicine

## 2015-01-13 ENCOUNTER — Encounter: Payer: Self-pay | Admitting: Internal Medicine

## 2015-01-13 ENCOUNTER — Telehealth: Payer: Self-pay | Admitting: *Deleted

## 2015-01-13 ENCOUNTER — Telehealth: Payer: Self-pay | Admitting: Internal Medicine

## 2015-01-13 VITALS — BP 158/86 | HR 78 | Temp 99.1°F | Resp 18 | Ht 67.0 in | Wt 151.0 lb

## 2015-01-13 DIAGNOSIS — C7951 Secondary malignant neoplasm of bone: Secondary | ICD-10-CM

## 2015-01-13 DIAGNOSIS — C7931 Secondary malignant neoplasm of brain: Secondary | ICD-10-CM

## 2015-01-13 DIAGNOSIS — C3491 Malignant neoplasm of unspecified part of right bronchus or lung: Secondary | ICD-10-CM

## 2015-01-13 DIAGNOSIS — G893 Neoplasm related pain (acute) (chronic): Secondary | ICD-10-CM

## 2015-01-13 DIAGNOSIS — C801 Malignant (primary) neoplasm, unspecified: Secondary | ICD-10-CM

## 2015-01-13 DIAGNOSIS — C778 Secondary and unspecified malignant neoplasm of lymph nodes of multiple regions: Secondary | ICD-10-CM

## 2015-01-13 DIAGNOSIS — C719 Malignant neoplasm of brain, unspecified: Secondary | ICD-10-CM

## 2015-01-13 HISTORY — DX: Malignant neoplasm of brain, unspecified: C71.9

## 2015-01-13 MED ORDER — HYDROCODONE-HOMATROPINE 5-1.5 MG/5ML PO SYRP: 5.0000 mL | ORAL_SOLUTION | Freq: Four times a day (QID) | ORAL | Status: DC | PRN

## 2015-01-13 MED ORDER — GADOBENATE DIMEGLUMINE 529 MG/ML IV SOLN
15.0000 mL | Freq: Once | INTRAVENOUS | Status: AC | PRN
  Administered 2015-01-13: 14 mL via INTRAVENOUS

## 2015-01-13 MED ORDER — DEXAMETHASONE 4 MG PO TABS
4.0000 mg | ORAL_TABLET | Freq: Two times a day (BID) | ORAL | Status: DC
Start: 2015-01-13 — End: 2015-02-02

## 2015-01-13 NOTE — Telephone Encounter (Signed)
Pt confirmed labs/ov per 06/10 POF, gave pt AVS and Calendar... KJ, sent msg to MD to make sure 4 wks out is ok instead of 3 weeks due to no availablity and pt's friend is having surgery the only day he has available. Also lft msg for KD in radiation to call pt with D/T for radiation consult.Marland KitchenMarland Kitchen

## 2015-01-13 NOTE — Telephone Encounter (Signed)
THE REPORT WAS CALLED, FAXED, AND GIVEN TO DR.MOHAMED.

## 2015-01-13 NOTE — Telephone Encounter (Signed)
S/w pt confirming labs/MD r/s per MD to 07/01 .... Laura Walter

## 2015-01-13 NOTE — Progress Notes (Signed)
Oncology Nurse Navigator Documentation  Oncology Nurse Navigator Flowsheets 01/13/2015  Referral date to RadOnc/MedOnc   Navigator Encounter Type Other/Office Visit  Patient Visit Type Follow-up  Treatment Phase Abnormal Scans  Barriers/Navigation Needs Education. Spoke with patient and friends today with Dr. Julien Nordmann.  Patient had PET and MRI Brain scan and was given results.  Unfortunately, she had active lesions on PET and multiple brain mets.  Dr. Julien Nordmann gave her next steps for treatment.  I reinforced and help explain next steps.  Patient is upset but has a great support system with her.  Dr. Julien Nordmann will make referral to Rad Onc.  I will call and follow up to expedite.    Education Understanding Cancer/ Treatment Options/Biopsy results are not back but Dr. Julien Nordmann suspects NSCLC.  I gave and explained information.  I did state that if pathology is not NSCLC I will give her new information.    Coordination of Care Other/Follow up with Radiology to expedite appt  Support Groups/Services Friends and Family  Time Spent with Patient 56

## 2015-01-13 NOTE — Progress Notes (Signed)
[place] [phone]   [phone]  OFFICE PROGRESS NOTE  [name], MD [address] [place] [address]  DIAGNOSIS: Questionable metastatic lung cancer presented with large right upper lobe lung mass in addition to bilateral mediastinal and left supraclavicular lymphadenopathy as well as multiple bone and brain lesions diagnosed in June 2016  PRIOR THERAPY: None  CURRENT THERAPY: None  INTERVAL HISTORY: Laura Walter 64 y.o. female returns to the clinic today for follow-up visit accompanied by her partner and friend. The patient is still complaining of low back pain and currently taking Advil and Tylenol. She has a prescription for Percocet but she did not fill it. She also has dry cough and is requesting a cough suppressant. She denied having any significant chest pain, shortness of breath or hemoptysis. She had several studies performed recently including CT scan of the chest, abdomen and pelvis, PET scan as well as brain MRI. She also underwent CT-guided biopsy of the L3 lytic lesion by interventional radiology yesterday and the final pathology is still pending. The patient is here today for evaluation and discussion of her imaging studies and recommendation regarding treatment of her condition.  MEDICAL HISTORY: Past Medical History  Diagnosis Date  . Pneumonia 2009 & 2011  . Allergy     seasonal  . H/O reactive hypoglycemia   . Osteopenia   . IBS (irritable bowel syndrome)     ALLERGIES:  is allergic to sulfonamide derivatives.  MEDICATIONS:  Current Outpatient Prescriptions  Medication Sig Dispense Refill  . Dextromethorphan HBr 15 MG/5ML LIQD Take 5 mLs by mouth every 5 (five) hours as needed (cough).    Marland Kitchen ibuprofen (ADVIL,MOTRIN) 200 MG tablet Take 200 mg by mouth every 6 (six) hours as needed for moderate pain.    Marland Kitchen scopolamine (TRANSDERM-SCOP) 1.5 MG Place 1 patch (1.5 mg total) onto the skin every 3  (three) days. 2 patch 0  . dexamethasone (DECADRON) 4 MG tablet Take 1 tablet (4 mg total) by mouth 2 (two) times daily. 40 tablet 0  . HYDROcodone-homatropine (HYCODAN) 5-1.5 MG/5ML syrup Take 5 mLs by mouth every 6 (six) hours as needed for cough. 120 mL 0  . oxyCODONE-acetaminophen (PERCOCET/ROXICET) 5-325 MG per tablet Take 1 tablet by mouth every 6 (six) hours as needed for severe pain. (Patient not taking: Reported on 01/10/2015) 40 tablet 0   No current facility-administered medications for this visit.    SURGICAL HISTORY:  Past Surgical History  Procedure Laterality Date  . Colonoscopy  2009    Negative; [facility]  . Tonsillectomy and adenoidectomy    . Wisdom tooth extraction      REVIEW OF SYSTEMS:  Constitutional: positive for fatigue Eyes: negative Ears, nose, mouth, throat, and face: negative Respiratory: positive for cough Cardiovascular: negative Gastrointestinal: negative Genitourinary:negative Integument/breast: negative Hematologic/lymphatic: negative Musculoskeletal:positive for back pain and bone pain Neurological: negative Behavioral/Psych: negative Endocrine: negative Allergic/Immunologic: negative   PHYSICAL EXAMINATION: General appearance: alert, cooperative, flushed and no distress Head: Normocephalic, without obvious abnormality, atraumatic Neck: no adenopathy, no JVD, supple, symmetrical, trachea midline and thyroid not enlarged, symmetric, no tenderness/mass/nodules Lymph nodes: Cervical, supraclavicular, and axillary nodes normal. Resp: clear to auscultation bilaterally Back: Tenderness to palpation at the lower thoracic and lumbar vertebrae. Cardio: regular rate and rhythm, S1, S2 normal, no murmur, click, rub or gallop GI: soft, non-tender; bowel sounds normal; no masses,  no organomegaly Extremities: extremities normal, atraumatic, no cyanosis or  edema Neurologic: Alert and oriented X 3, normal strength and tone. Normal symmetric reflexes.  Normal coordination and gait  ECOG PERFORMANCE STATUS: 1 - Symptomatic but completely ambulatory  Blood pressure 158/86, pulse 78, temperature 99.1 F (37.3 C), temperature source Oral, resp. rate 18, height 5' 7"  (1.702 m), weight 151 lb (68.493 kg), SpO2 98 %.  LABORATORY DATA: Lab Results  Component Value Date   WBC 6.5 01/12/2015   HGB 12.8 01/12/2015   HCT 38.5 01/12/2015   MCV 88.5 01/12/2015   PLT 198 01/12/2015      Chemistry      Component Value Date/Time   NA 143 01/03/2015 1342   NA 140 10/13/2014 0941   K 3.6 01/03/2015 1342   K 4.2 10/13/2014 0941   CL 108 10/13/2014 0941   CO2 25 01/03/2015 1342   CO2 28 10/13/2014 0941   BUN 24.7 01/03/2015 1342   BUN 22 10/13/2014 0941   CREATININE 1.0 01/03/2015 1342   CREATININE 0.98 10/13/2014 0941   CREATININE 0.83 11/02/2013 0832      Component Value Date/Time   CALCIUM 9.3 01/03/2015 1342   CALCIUM 9.5 10/13/2014 0941   ALKPHOS 90 01/03/2015 1342   ALKPHOS 63 10/13/2014 0941   AST 15 01/03/2015 1342   AST 14 10/13/2014 0941   ALT 10 01/03/2015 1342   ALT 12 10/13/2014 0941   BILITOT 0.44 01/03/2015 1342   BILITOT 0.6 10/13/2014 0941       RADIOGRAPHIC STUDIES: Ct Chest W Contrast  01/05/2015   CLINICAL DATA:  Osseous metastases on recent MRI lumbar spine, history irritable bowel syndrome  EXAM: CT CHEST, ABDOMEN, AND PELVIS WITH CONTRAST  TECHNIQUE: Multidetector CT imaging of the chest, abdomen and pelvis was performed following the standard protocol during bolus administration of intravenous contrast. Sagittal and coronal MPR images reconstructed from axial data set.  CONTRAST:  140m OMNIPAQUE IOHEXOL 300 MG/ML SOLN IV. Dilute oral contrast.  COMPARISON:  None  FINDINGS: CT CHEST FINDINGS  Aorta normal caliber.  Thoracic vascular structures grossly patent on nondedicated exam.  Small to moderate-sized pericardial effusion.  Enlarged thoracic nodes including:  Subcarinal level 7 node 12 mm short axis image 28.   RIGHT hilar 10R node 15 mm short axis image 26.  RIGHT hilar 10R node 14 mm short axis image 25.  11 mm precarinal node level 4.  Additional smaller sub carinal and RIGHT hilar nodes.  Upper paratracheal level 2 node 8 mm short axis image 18.  Mass identified posterior segment RIGHT upper lobe 4.8 x 4.1 x 4.4 cm image 21 like representing a primary pulmonary neoplasm.  Mass abuts pleural surface laterally with associated RIGHT upper lobe atelectasis superiorly.  Additional soft tissue extends to RIGHT hilum question atelectasis versus tumor extension.  Associated peribronchial thickening in RIGHT upper lobe and scattered interstitial thickening.  Tiny RIGHT upper lobe nodule 3 mm diameter image 24.  RIGHT pleural effusion with compressive atelectasis of posterior RIGHT lung.  Focal airspace infiltrate RIGHT middle lobe.  LEFT lung clear.  No pneumothorax.  Nonspecific small sclerotic focus posterior LEFT fifth rib.  Lytic destructive lesion with minimal superior endplate height loss at T9 vertebral body.  Probable lytic lesion posterior RIGHT fifth rib.  CT ABDOMEN AND PELVIS FINDINGS  Two hepatic cysts largest lateral segment LEFT lobe 3.7 x 2.8 x 3.5 cm.  Remainder of liver, spleen, pancreas, kidneys, and adrenal glands normal.  Normal appendix, bladder, uterus, and ovaries.  Stomach and bowel loops normal appearance.  No mass, adenopathy, free air, free fluid or hernia.  Lytic metastatic lesions within L2 and L3 vertebral bodies as well as in LEFT ischium.  Abnormal soft tissue/tumor extends into the RIGHT ventral aspect with spinal canal at L3 and into the RIGHT L3-L4 neural foramen, question exerting mass effect upon the RIGHT L3 and L4 roots.  IMPRESSION: 4.8 x 4.1 x 4.4 cm diameter soft tissue mass posterior segment RIGHT upper lobe likely representing a primary pulmonary neoplasm.  Associated RIGHT hilar and mediastinal adenopathy.  Small to moderate pericardial effusion.  Small RIGHT pleural effusion.  3 mm  RIGHT upper lobe nodule.  Osseous metastases as above.  RIGHT middle lobe airspace infiltrate.   Electronically Signed   By: Lavonia Dana M.D.   On: 01/05/2015 09:30   Mr Jeri Cos JK Contrast  01/13/2015   CLINICAL DATA:  64 year old female with new diagnosis of metastatic lung cancer. Staging. Subsequent encounter.  EXAM: MRI HEAD WITHOUT AND WITH CONTRAST  TECHNIQUE: Multiplanar, multiecho pulse sequences of the brain and surrounding structures were obtained without and with intravenous contrast.  CONTRAST:  43m MULTIHANCE GADOBENATE DIMEGLUMINE 529 MG/ML IV SOLN  COMPARISON:  PET-CT 01/10/2015  FINDINGS: Multiple small enhancing bilateral brain metastases. No definite leptomeningeal disease. At least 10 individual metastases are identified, ranging from punctate to 9 mm diameter. The left cerebellum and both cerebral hemispheres are affected. The right insula is affected. The deep gray matter nuclei and brainstem appear spared at this time. Very mild associated cerebral edema at this time. No intracranial mass effect.  No acute intracranial hemorrhage identified. No ventriculomegaly. No restricted diffusion or evidence of acute infarction. Negative pituitary, cervicomedullary junction and visualized cervical spinal cord.  Heterogeneous bone marrow signal in the skull, no definite calvarial metastasis.  Other scattered nonspecific cerebral white matter T2 and FLAIR hyperintensity. Major intracranial vascular flow voids are preserved. There is mild intracranial artery dolichoectasia. Visible internal auditory structures appear normal. Visualized paranasal sinuses and mastoids are clear. Orbits soft tissues appear normal. Visualized scalp soft tissues are within normal limits.  IMPRESSION: 1. Positive for multiple small brain metastases. At least 10 individual lesions are present, ranging from punctate to 9 mm diameter. 2. Minimal cerebral edema and no intracranial mass effect at this time.  These results will be  called to the ordering clinician or representative by the Radiologist Assistant, and communication documented in the PACS or zVision Dashboard.   Electronically Signed   By: HGenevie AnnM.D.   On: 01/13/2015 08:09   Ct Abdomen Pelvis W Contrast  01/05/2015   CLINICAL DATA:  Osseous metastases on recent MRI lumbar spine, history irritable bowel syndrome  EXAM: CT CHEST, ABDOMEN, AND PELVIS WITH CONTRAST  TECHNIQUE: Multidetector CT imaging of the chest, abdomen and pelvis was performed following the standard protocol during bolus administration of intravenous contrast. Sagittal and coronal MPR images reconstructed from axial data set.  CONTRAST:  1037mOMNIPAQUE IOHEXOL 300 MG/ML SOLN IV. Dilute oral contrast.  COMPARISON:  None  FINDINGS: CT CHEST FINDINGS  Aorta normal caliber.  Thoracic vascular structures grossly patent on nondedicated exam.  Small to moderate-sized pericardial effusion.  Enlarged thoracic nodes including:  Subcarinal level 7 node 12 mm short axis image 28.  RIGHT hilar 10R node 15 mm short axis image 26.  RIGHT hilar 10R node 14 mm short axis image 25.  11 mm precarinal node level 4.  Additional smaller sub carinal and RIGHT hilar nodes.  Upper paratracheal level 2 node 8  mm short axis image 18.  Mass identified posterior segment RIGHT upper lobe 4.8 x 4.1 x 4.4 cm image 21 like representing a primary pulmonary neoplasm.  Mass abuts pleural surface laterally with associated RIGHT upper lobe atelectasis superiorly.  Additional soft tissue extends to RIGHT hilum question atelectasis versus tumor extension.  Associated peribronchial thickening in RIGHT upper lobe and scattered interstitial thickening.  Tiny RIGHT upper lobe nodule 3 mm diameter image 24.  RIGHT pleural effusion with compressive atelectasis of posterior RIGHT lung.  Focal airspace infiltrate RIGHT middle lobe.  LEFT lung clear.  No pneumothorax.  Nonspecific small sclerotic focus posterior LEFT fifth rib.  Lytic destructive lesion  with minimal superior endplate height loss at T9 vertebral body.  Probable lytic lesion posterior RIGHT fifth rib.  CT ABDOMEN AND PELVIS FINDINGS  Two hepatic cysts largest lateral segment LEFT lobe 3.7 x 2.8 x 3.5 cm.  Remainder of liver, spleen, pancreas, kidneys, and adrenal glands normal.  Normal appendix, bladder, uterus, and ovaries.  Stomach and bowel loops normal appearance.  No mass, adenopathy, free air, free fluid or hernia.  Lytic metastatic lesions within L2 and L3 vertebral bodies as well as in LEFT ischium.  Abnormal soft tissue/tumor extends into the RIGHT ventral aspect with spinal canal at L3 and into the RIGHT L3-L4 neural foramen, question exerting mass effect upon the RIGHT L3 and L4 roots.  IMPRESSION: 4.8 x 4.1 x 4.4 cm diameter soft tissue mass posterior segment RIGHT upper lobe likely representing a primary pulmonary neoplasm.  Associated RIGHT hilar and mediastinal adenopathy.  Small to moderate pericardial effusion.  Small RIGHT pleural effusion.  3 mm RIGHT upper lobe nodule.  Osseous metastases as above.  RIGHT middle lobe airspace infiltrate.   Electronically Signed   By: Lavonia Dana M.D.   On: 01/05/2015 09:30   Nm Pet Image Initial (pi) Skull Base To Thigh  01/10/2015   CLINICAL DATA:  Initial Treatment strategy for Lung cancer.  EXAM: NUCLEAR MEDICINE PET SKULL BASE TO THIGH  TECHNIQUE: 7.75 mCi F-18 FDG was injected intravenously. Full-ring PET imaging was performed from the skull base to thigh after the radiotracer. CT data was obtained and used for attenuation correction and anatomic localization.  FASTING BLOOD GLUCOSE:  Value: 89 mg/dl  COMPARISON:  None.  FINDINGS: NECK  No hypermetabolic lymph nodes in the neck.  CHEST  Hypermetabolic mass within the right upper lobe measures 4 x 3.7 cm and has an SUV max equal to 14.5. There is a small to moderate right pleural effusion. There is hypermetabolic right hilar, bilateral mediastinal, and left supraclavicular adenopathy. The  right hilar lymph node mass measures approximately 3.8 cm and has an SUV max equal to 15.7. Hypermetabolic left paratracheal lymph node measures 1 cm and has an SUV max equal to 7.07. Left supraclavicular lymph node measures 9 mm and has an SUV max equal to 11.9.  ABDOMEN/PELVIS  No abnormal hypermetabolic activity within the liver, pancreas, adrenal glands, or spleen. No hypermetabolic lymph nodes in the abdomen or pelvis.  SKELETON  Extensive multi focal hypermetabolic bone metastases identified. Index lytic lesion involving the left first rib measures approximate 1.9 cm and has an SUV max equal to 8.7. Large destructive lesion involving the T9 vertebra measures 2.5 cm and has an SUV max equal to 14.5. Destructive bone lesion involving the L3 vertebra measures 3.5 cm and has an SUV max equal to 14.7. Hypermetabolic lytic lesions are noted involving the posterior column of the left acetabulum  as well as the left inferior pubic rami.  IMPRESSION: 1. Right upper lobe lung mass is intensely hypermetabolic compatible with primary lung neoplasm. 2. Hypermetabolic ipsilateral mediastinal hypermetabolic adenopathy as well as bilateral mediastinal adenopathy and left supraclavicular adenopathy. 3. Multi focal lytic bone metastasis are identified involving the ribs, spine and bony pelvis.   Electronically Signed   By: Kerby Moors M.D.   On: 01/10/2015 09:47   Ct Biopsy  01/12/2015   INDICATION: Concern for metastatic lung cancer. Please perform CT-guided biopsy of dominant lytic lesion involving the right-sided the L3 vertebral body for tissue diagnostic purposes  EXAM: CT-GUIDED BIOPSY OF L3 LYTIC LESION  COMPARISON:  PET-CT - 01/10/2015; CT of the chest, abdomen pelvis - 01/05/2015  MEDICATIONS: None  ANESTHESIA/SEDATION: Fentanyl 75 mcg IV; Versed 1.5 mg IV  Sedation time  15 minutes  CONTRAST:  None  COMPLICATIONS: None immediate.  PROCEDURE: Informed consent was obtained from the patient following an explanation  of the procedure, risks, benefits and alternatives. The patient understands, agrees and consents for the procedure. All questions were addressed. A time out was performed prior to the initiation of the procedure.  The patient was positioned prone on the CT table and a limited CT was performed for procedural planning demonstrating unchanged appearance of dominant approximately 3.3 x 2.8 cm lytic mass involving primarily the right-sided the L3 vertebral body (image 17, series 2). The procedure was planned. The operative site was prepped and draped in the usual sterile fashion. Appropriate trajectory was confirmed with a 22 gauge spinal needle after the adjacent tissues were anesthetized with 1% Lidocaine with epinephrine.  A 22 gauge spinal needle was utilized for procedural planning. Next, an 11 gauge coaxial bone biopsy needle was advanced through the right L3 pedicle. Needle position was confirmed with CT imaging. Next, 2 bone lesion biopsies were obtained with the inner 13 gauge coaxial bone biopsy needle. Appropriate positioning was confirmed with CT imaging and finally a completion bone lesion biopsy was obtained with the 11 gauge outer bone marrow device.  The needle was removed intact. Hemostasis was obtained with compression and a dressing was placed. The patient tolerated the procedure well without immediate post procedural complication.  IMPRESSION: Successful CT guided biopsy of L3 lytic lesion via a right L3 transpedicular approach.   Electronically Signed   By: Sandi Mariscal M.D.   On: 01/12/2015 15:24    ASSESSMENT AND PLAN: This is a very pleasant 64 years old white female recently diagnosed with questionable metastatic lung cancer, most likely non-small cell lung cancer in and probably adenocarcinoma in a patient with a never smoking history. The final pathology is still pending. I had a lengthy discussion with the patient today about her condition and I showed her the images of the CT scan of the  chest, abdomen and pelvis, PET scan as well as MRI of the brain. I recommended for the patient to see radiation oncology for consideration of brain irradiation as well as palliative radiotherapy to the metastatic bone lesion. I started the patient on Decadron 4 mg by mouth twice a day for the brain metastasis. If the final pathology was consistent with non-squamous histology, I would ask the pathology department to send [facility] for molecular biomarker testing with the hope to see target mutation-like EGFR or ALK. For the dry cough, I started the patient on Hycodan 5 ML by mouth every 6 hours as needed for cough. For pain management, I recommended for the  patient to start taking Percocet on as-needed basis and would consider adding other pain medications in the future if needed. The patient would come back for follow-up visit in 3 weeks after completion of her brain and bone radiation for reevaluation and discussion of her treatment options based on the final pathology and molecular studies. She was advised to call immediately if she has any concerning symptoms in the interval. The patient voices understanding of current disease status and treatment options and is in agreement with the current care plan.  All questions were answered. The patient knows to call the clinic with any problems, questions or concerns. We can certainly see the patient much sooner if necessary.  I spent 20 minutes counseling the patient face to face. The total time spent in the appointment was 30 minutes.  Disclaimer: This note was dictated with voice recognition software. Similar sounding words can inadvertently be transcribed and may not be corrected upon review.

## 2015-01-16 ENCOUNTER — Encounter: Payer: Self-pay | Admitting: Radiation Oncology

## 2015-01-16 NOTE — Progress Notes (Addendum)
Location/Histology of Brain Tumor:01/13/15 MR Brain: Multiple small enhancing bilateral brain metastases. No definite leptomeningeal disease. At least 10 individual metastases are identified, ranging from punctate to 9 mm diameter. The left cerebellum and both cerebral hemispheres are affected. The right insula is affected.    IMPRESSION: 01/10/15  PET Scan 1. Right upper lobe lung mass is intensely hypermetabolic compatible with primary lung neoplasm. 2. Hypermetabolic ipsilateral mediastinal hypermetabolic adenopathy as well as bilateral mediastinal adenopathy and left supraclavicular adenopathy. 3. Multi focal lytic bone metastasis are identified involving the ribs, spine and bony pelvis.  Patient presented with symptoms of:  Recent back pain  Lower and left leg   Past or anticipated interventions, if any, per neurosurgery: none  Past or anticipated interventions, if any, per medical oncology:Dr. Julien Nordmann 01/13/15 note referral for bone/brain radiation, f/u 02/03/15   Dose of Decadron, if applicable: '4mg'$  take 1 tablet  By mouth 2 times a day   Recent neurologic symptoms, if any: None  Seizures: No  Headaches: NO  Nausea: NO  Dizziness/ataxia:   Difficulty with hand coordination: NO  Focal numbness/weakness;NO  Visual deficits/changes: NOConfusion/Memory deficits: No  Painful bone metastases at present, if any:low back pain Biopsy L3 lytic lesion on  01/12/15 pending  SAFETY ISSUES: NO  Prior radiation? No  Pacemaker/ICD? NO  Possible current pregnancy? NO  Is the patient on methotrexate?NO  Additional Complaints / other details:  Partner,  No children, Metastatic lung /bone/brain mets, no smoking hx,,, alcohol yes,  Mother TIA-Dementia,Father MI,Maternal Aunt stomach cancer,MI in her 69's, maternal grandfather MI in his 35's,   Ct Simulation today 01/18/15@ 230pm Diagnosis 01/12/15 : Bone, biopsy, lytic lesion involving L3 - POSITIVE FOR METASTATIC  ADENOCARCINOMA  Allergies:Sulfaonamide derivatives

## 2015-01-17 ENCOUNTER — Telehealth: Payer: Self-pay | Admitting: Internal Medicine

## 2015-01-17 DIAGNOSIS — C3491 Malignant neoplasm of unspecified part of right bronchus or lung: Secondary | ICD-10-CM

## 2015-01-17 NOTE — Telephone Encounter (Signed)
Order encounter

## 2015-01-18 ENCOUNTER — Encounter: Payer: Self-pay | Admitting: Radiation Oncology

## 2015-01-18 ENCOUNTER — Ambulatory Visit
Admission: RE | Admit: 2015-01-18 | Discharge: 2015-01-18 | Disposition: A | Discharge status: Home / Self Care | Payer: BC Managed Care – PPO | Source: Ambulatory Visit

## 2015-01-18 ENCOUNTER — Ambulatory Visit
Admission: RE | Admit: 2015-01-18 | Discharge: 2015-01-18 | Disposition: A | Discharge status: Home / Self Care | Payer: BC Managed Care – PPO | Source: Ambulatory Visit | Attending: Radiation Oncology | Admitting: Radiation Oncology

## 2015-01-18 VITALS — BP 149/88 | HR 74 | Temp 98.6°F | Resp 20 | Ht 67.0 in | Wt 154.1 lb

## 2015-01-18 DIAGNOSIS — C7931 Secondary malignant neoplasm of brain: Secondary | ICD-10-CM

## 2015-01-18 DIAGNOSIS — C7951 Secondary malignant neoplasm of bone: Secondary | ICD-10-CM

## 2015-01-18 DIAGNOSIS — C7949 Secondary malignant neoplasm of other parts of nervous system: Secondary | ICD-10-CM

## 2015-01-18 HISTORY — DX: Bronchitis, not specified as acute or chronic: J40

## 2015-01-18 HISTORY — DX: Malignant neoplasm of bone and articular cartilage, unspecified: C41.9

## 2015-01-18 HISTORY — DX: Malignant neoplasm of brain, unspecified: C71.9

## 2015-01-18 NOTE — Progress Notes (Signed)
Please see the Nurse Progress Note in the MD Initial Consult Encounter for this patient. 

## 2015-01-20 ENCOUNTER — Other Ambulatory Visit: Payer: Self-pay | Admitting: Radiology

## 2015-01-23 ENCOUNTER — Ambulatory Visit: Payer: BC Managed Care – PPO | Admitting: Radiation Oncology

## 2015-01-23 ENCOUNTER — Ambulatory Visit: Payer: BC Managed Care – PPO

## 2015-01-23 ENCOUNTER — Other Ambulatory Visit: Payer: Self-pay | Admitting: Radiology

## 2015-01-24 ENCOUNTER — Ambulatory Visit (HOSPITAL_COMMUNITY)
Admission: RE | Admit: 2015-01-24 | Discharge: 2015-01-24 | Disposition: A | Discharge status: Home / Self Care | Payer: BC Managed Care – PPO | Source: Ambulatory Visit | Attending: Interventional Radiology | Admitting: Interventional Radiology

## 2015-01-24 ENCOUNTER — Ambulatory Visit (HOSPITAL_COMMUNITY)
Admission: RE | Admit: 2015-01-24 | Discharge: 2015-01-24 | Disposition: A | Discharge status: Home / Self Care | Payer: BC Managed Care – PPO | Source: Ambulatory Visit | Attending: Internal Medicine | Admitting: Internal Medicine

## 2015-01-24 ENCOUNTER — Encounter (HOSPITAL_COMMUNITY): Payer: Self-pay

## 2015-01-24 DIAGNOSIS — Z882 Allergy status to sulfonamides status: Secondary | ICD-10-CM | POA: Diagnosis not present

## 2015-01-24 DIAGNOSIS — C7951 Secondary malignant neoplasm of bone: Secondary | ICD-10-CM | POA: Insufficient documentation

## 2015-01-24 DIAGNOSIS — C7931 Secondary malignant neoplasm of brain: Secondary | ICD-10-CM | POA: Insufficient documentation

## 2015-01-24 DIAGNOSIS — Z8249 Family history of ischemic heart disease and other diseases of the circulatory system: Secondary | ICD-10-CM | POA: Insufficient documentation

## 2015-01-24 DIAGNOSIS — R918 Other nonspecific abnormal finding of lung field: Secondary | ICD-10-CM | POA: Diagnosis present

## 2015-01-24 DIAGNOSIS — C3491 Malignant neoplasm of unspecified part of right bronchus or lung: Secondary | ICD-10-CM

## 2015-01-24 DIAGNOSIS — Z833 Family history of diabetes mellitus: Secondary | ICD-10-CM | POA: Insufficient documentation

## 2015-01-24 DIAGNOSIS — C3411 Malignant neoplasm of upper lobe, right bronchus or lung: Secondary | ICD-10-CM | POA: Insufficient documentation

## 2015-01-24 LAB — CBC
HCT: 41.4 % (ref 36.0–46.0)
Hemoglobin: 14.1 g/dL (ref 12.0–15.0)
MCH: 30.3 pg (ref 26.0–34.0)
MCHC: 34.1 g/dL (ref 30.0–36.0)
MCV: 88.8 fL (ref 78.0–100.0)
Platelets: 231 10*3/uL (ref 150–400)
RBC: 4.66 MIL/uL (ref 3.87–5.11)
RDW: 13.7 % (ref 11.5–15.5)
WBC: 9.2 10*3/uL (ref 4.0–10.5)

## 2015-01-24 LAB — PROTIME-INR
INR: 1.13 (ref 0.00–1.49)
Prothrombin Time: 14.7 seconds (ref 11.6–15.2)

## 2015-01-24 LAB — APTT: aPTT: 23 seconds — ABNORMAL LOW (ref 24–37)

## 2015-01-24 MED ORDER — SODIUM CHLORIDE 0.9 % IV SOLN
INTRAVENOUS | Status: AC | PRN
  Administered 2015-01-24: 75 mL/h via INTRAVENOUS

## 2015-01-24 MED ORDER — LIDOCAINE HCL 1 % IJ SOLN
INTRAMUSCULAR | Status: AC
  Filled 2015-01-24: qty 20

## 2015-01-24 MED ORDER — MIDAZOLAM HCL 2 MG/2ML IJ SOLN
INTRAMUSCULAR | Status: AC
  Filled 2015-01-24: qty 4

## 2015-01-24 MED ORDER — SODIUM CHLORIDE 0.9 % IV SOLN: Freq: Once | INTRAVENOUS | Status: DC

## 2015-01-24 MED ORDER — FENTANYL CITRATE (PF) 100 MCG/2ML IJ SOLN
INTRAMUSCULAR | Status: AC
  Filled 2015-01-24: qty 4

## 2015-01-24 MED ORDER — FENTANYL CITRATE (PF) 100 MCG/2ML IJ SOLN
INTRAMUSCULAR | Status: AC | PRN
  Administered 2015-01-24: 50 ug via INTRAVENOUS

## 2015-01-24 MED ORDER — MIDAZOLAM HCL 2 MG/2ML IJ SOLN
INTRAMUSCULAR | Status: AC | PRN
  Administered 2015-01-24: 1 mg via INTRAVENOUS

## 2015-01-24 NOTE — H&P (Signed)
Chief Complaint: Presumed lung Ca Brain/bony mets Need for genetic testing/ tissue sample  Referring Physician(s): Mohamed,Mohamed  History of Present Illness: Laura Walter is a 64 y.o. female   Pt underwent L3 bone bx 01/12/2015 + metastatic adenocarcinoma Not enough tissue for [facility] genetic testing Dr Julien Nordmann requests R lung mass biopsy for additional sampling and testing Scheduled now for same  RUL mass; B mediastinal LAN and L [place] LAN Brain and Bone mets  Past Medical History  Diagnosis Date  . Pneumonia 2009 & 2011  . Allergy     seasonal  . H/O reactive hypoglycemia   . Osteopenia   . IBS (irritable bowel syndrome)   . Brain cancer 01/13/15    MRI multiple small brain mets 10 individual lesions  . Bone cancer 01/10/15 PET    lytic lesion lt 1st rib,T9,L3 Lt acetabulum  . Bronchitis     Past Surgical History  Procedure Laterality Date  . Colonoscopy  2009    Negative; [facility]  . Tonsillectomy and adenoidectomy    . Wisdom tooth extraction      Allergies: Sulfonamide derivatives  Medications: Prior to Admission medications   Medication Sig Start Date End Date Taking? Authorizing Provider  dexamethasone (DECADRON) 4 MG tablet Take 1 tablet (4 mg total) by mouth 2 (two) times daily. 01/13/15  Yes Curt Bears, MD  Dextromethorphan HBr 15 MG/5ML LIQD Take 5 mLs by mouth every 5 (five) hours as needed (cough).   Yes Historical Provider, MD  HYDROcodone-homatropine (HYCODAN) 5-1.5 MG/5ML syrup Take 5 mLs by mouth every 6 (six) hours as needed for cough. 01/13/15  Yes Curt Bears, MD  ibuprofen (ADVIL,MOTRIN) 200 MG tablet Take 200 mg by mouth every 6 (six) hours as needed for moderate pain.   Yes Historical Provider, MD  oxyCODONE-acetaminophen (PERCOCET/ROXICET) 5-325 MG per tablet Take 1 tablet by mouth every 6 (six) hours as needed for severe pain. 01/03/15  Yes Curt Bears, MD  scopolamine (TRANSDERM-SCOP) 1.5 MG Place 1 patch (1.5 mg total)  onto the skin every 3 (three) days. Patient taking differently: Place 1 patch onto the skin every 3 (three) days as needed (traveling by plane).  10/08/13   Hendricks Limes, MD     Family History  Problem Relation Age of Onset  . Transient ischemic attack Mother   . Mental illness Mother     Dementia  . Diabetes Father   . Heart attack Father 63  . Stomach cancer Maternal Aunt   . Heart attack Maternal Aunt     in 8s  . Diabetes Paternal Uncle     X 3  . Heart attack Maternal Grandfather     in 54s    History   Social History  . Marital Status: Significant Other    Spouse Name: N/A  . Number of Children: N/A  . Years of Education: N/A   Social History Main Topics  . Smoking status: Never Smoker   . Smokeless tobacco: Never Used  . Alcohol Use: Yes     Comment: occasionally rare wine  . Drug Use: No  . Sexual Activity: Not on file   Other Topics Concern  . None   Social History Narrative     Review of Systems: A 12 point ROS discussed and pertinent positives are indicated in the HPI above.  All other systems are negative.  Review of Systems  Constitutional: Negative for fever and activity change.  Respiratory: Positive for cough. Negative  for shortness of breath.   Gastrointestinal: Negative for abdominal pain.  Neurological: Positive for weakness.  Psychiatric/Behavioral: Negative for behavioral problems and confusion.    Vital Signs: BP 134/84 mmHg  Pulse 59  Temp(Src) 98 F (36.7 C)  Resp 18  Ht '5\' 7"'$  (1.702 m)  Wt 154 lb (69.854 kg)  BMI 24.11 kg/m2  SpO2 98%  Physical Exam  Constitutional: She is oriented to person, place, and time. She appears well-nourished.  Cardiovascular: Normal rate, regular rhythm and normal heart sounds.   No murmur heard. Pulmonary/Chest: Effort normal and breath sounds normal. She has no wheezes.  Abdominal: Soft. Bowel sounds are normal. She exhibits no distension.  Musculoskeletal: Normal range of motion.    Neurological: She is alert and oriented to person, place, and time.  Skin: Skin is warm and dry.  Psychiatric: She has a normal mood and affect. Her behavior is normal. Judgment and thought content normal.  Nursing note and vitals reviewed.   Mallampati Score:  MD Evaluation Airway: WNL Heart: WNL Abdomen: WNL Chest/ Lungs: WNL ASA  Classification: 3 Mallampati/Airway Score: One  Imaging: Ct Chest W Contrast  01/05/2015   CLINICAL DATA:  Osseous metastases on recent MRI lumbar spine, history irritable bowel syndrome  EXAM: CT CHEST, ABDOMEN, AND PELVIS WITH CONTRAST  TECHNIQUE: Multidetector CT imaging of the chest, abdomen and pelvis was performed following the standard protocol during bolus administration of intravenous contrast. Sagittal and coronal MPR images reconstructed from axial data set.  CONTRAST:  12m OMNIPAQUE IOHEXOL 300 MG/ML SOLN IV. Dilute oral contrast.  COMPARISON:  None  FINDINGS: CT CHEST FINDINGS  Aorta normal caliber.  Thoracic vascular structures grossly patent on nondedicated exam.  Small to moderate-sized pericardial effusion.  Enlarged thoracic nodes including:  Subcarinal level 7 node 12 mm short axis image 28.  RIGHT hilar 10R node 15 mm short axis image 26.  RIGHT hilar 10R node 14 mm short axis image 25.  11 mm precarinal node level 4.  Additional smaller sub carinal and RIGHT hilar nodes.  Upper paratracheal level 2 node 8 mm short axis image 18.  Mass identified posterior segment RIGHT upper lobe 4.8 x 4.1 x 4.4 cm image 21 like representing a primary pulmonary neoplasm.  Mass abuts pleural surface laterally with associated RIGHT upper lobe atelectasis superiorly.  Additional soft tissue extends to RIGHT hilum question atelectasis versus tumor extension.  Associated peribronchial thickening in RIGHT upper lobe and scattered interstitial thickening.  Tiny RIGHT upper lobe nodule 3 mm diameter image 24.  RIGHT pleural effusion with compressive atelectasis of  posterior RIGHT lung.  Focal airspace infiltrate RIGHT middle lobe.  LEFT lung clear.  No pneumothorax.  Nonspecific small sclerotic focus posterior LEFT fifth rib.  Lytic destructive lesion with minimal superior endplate height loss at T9 vertebral body.  Probable lytic lesion posterior RIGHT fifth rib.  CT ABDOMEN AND PELVIS FINDINGS  Two hepatic cysts largest lateral segment LEFT lobe 3.7 x 2.8 x 3.5 cm.  Remainder of liver, spleen, pancreas, kidneys, and adrenal glands normal.  Normal appendix, bladder, uterus, and ovaries.  Stomach and bowel loops normal appearance.  No mass, adenopathy, free air, free fluid or hernia.  Lytic metastatic lesions within L2 and L3 vertebral bodies as well as in LEFT ischium.  Abnormal soft tissue/tumor extends into the RIGHT ventral aspect with spinal canal at L3 and into the RIGHT L3-L4 neural foramen, question exerting mass effect upon the RIGHT L3 and L4 roots.  IMPRESSION: 4.8 x  4.1 x 4.4 cm diameter soft tissue mass posterior segment RIGHT upper lobe likely representing a primary pulmonary neoplasm.  Associated RIGHT hilar and mediastinal adenopathy.  Small to moderate pericardial effusion.  Small RIGHT pleural effusion.  3 mm RIGHT upper lobe nodule.  Osseous metastases as above.  RIGHT middle lobe airspace infiltrate.   Electronically Signed   By: Lavonia Dana M.D.   On: 01/05/2015 09:30   Mr Jeri Cos CB Contrast  01/13/2015   CLINICAL DATA:  64 year old female with new diagnosis of metastatic lung cancer. Staging. Subsequent encounter.  EXAM: MRI HEAD WITHOUT AND WITH CONTRAST  TECHNIQUE: Multiplanar, multiecho pulse sequences of the brain and surrounding structures were obtained without and with intravenous contrast.  CONTRAST:  62m MULTIHANCE GADOBENATE DIMEGLUMINE 529 MG/ML IV SOLN  COMPARISON:  PET-CT 01/10/2015  FINDINGS: Multiple small enhancing bilateral brain metastases. No definite leptomeningeal disease. At least 10 individual metastases are identified, ranging  from punctate to 9 mm diameter. The left cerebellum and both cerebral hemispheres are affected. The right insula is affected. The deep gray matter nuclei and brainstem appear spared at this time. Very mild associated cerebral edema at this time. No intracranial mass effect.  No acute intracranial hemorrhage identified. No ventriculomegaly. No restricted diffusion or evidence of acute infarction. Negative pituitary, cervicomedullary junction and visualized cervical spinal cord.  Heterogeneous bone marrow signal in the skull, no definite calvarial metastasis.  Other scattered nonspecific cerebral white matter T2 and FLAIR hyperintensity. Major intracranial vascular flow voids are preserved. There is mild intracranial artery dolichoectasia. Visible internal auditory structures appear normal. Visualized paranasal sinuses and mastoids are clear. Orbits soft tissues appear normal. Visualized scalp soft tissues are within normal limits.  IMPRESSION: 1. Positive for multiple small brain metastases. At least 10 individual lesions are present, ranging from punctate to 9 mm diameter. 2. Minimal cerebral edema and no intracranial mass effect at this time.  These results will be called to the ordering clinician or representative by the Radiologist Assistant, and communication documented in the PACS or zVision Dashboard.   Electronically Signed   By: HGenevie AnnM.D.   On: 01/13/2015 08:09   Ct Abdomen Pelvis W Contrast  01/05/2015   CLINICAL DATA:  Osseous metastases on recent MRI lumbar spine, history irritable bowel syndrome  EXAM: CT CHEST, ABDOMEN, AND PELVIS WITH CONTRAST  TECHNIQUE: Multidetector CT imaging of the chest, abdomen and pelvis was performed following the standard protocol during bolus administration of intravenous contrast. Sagittal and coronal MPR images reconstructed from axial data set.  CONTRAST:  1054mOMNIPAQUE IOHEXOL 300 MG/ML SOLN IV. Dilute oral contrast.  COMPARISON:  None  FINDINGS: CT CHEST FINDINGS   Aorta normal caliber.  Thoracic vascular structures grossly patent on nondedicated exam.  Small to moderate-sized pericardial effusion.  Enlarged thoracic nodes including:  Subcarinal level 7 node 12 mm short axis image 28.  RIGHT hilar 10R node 15 mm short axis image 26.  RIGHT hilar 10R node 14 mm short axis image 25.  11 mm precarinal node level 4.  Additional smaller sub carinal and RIGHT hilar nodes.  Upper paratracheal level 2 node 8 mm short axis image 18.  Mass identified posterior segment RIGHT upper lobe 4.8 x 4.1 x 4.4 cm image 21 like representing a primary pulmonary neoplasm.  Mass abuts pleural surface laterally with associated RIGHT upper lobe atelectasis superiorly.  Additional soft tissue extends to RIGHT hilum question atelectasis versus tumor extension.  Associated peribronchial thickening in RIGHT upper lobe and  scattered interstitial thickening.  Tiny RIGHT upper lobe nodule 3 mm diameter image 24.  RIGHT pleural effusion with compressive atelectasis of posterior RIGHT lung.  Focal airspace infiltrate RIGHT middle lobe.  LEFT lung clear.  No pneumothorax.  Nonspecific small sclerotic focus posterior LEFT fifth rib.  Lytic destructive lesion with minimal superior endplate height loss at T9 vertebral body.  Probable lytic lesion posterior RIGHT fifth rib.  CT ABDOMEN AND PELVIS FINDINGS  Two hepatic cysts largest lateral segment LEFT lobe 3.7 x 2.8 x 3.5 cm.  Remainder of liver, spleen, pancreas, kidneys, and adrenal glands normal.  Normal appendix, bladder, uterus, and ovaries.  Stomach and bowel loops normal appearance.  No mass, adenopathy, free air, free fluid or hernia.  Lytic metastatic lesions within L2 and L3 vertebral bodies as well as in LEFT ischium.  Abnormal soft tissue/tumor extends into the RIGHT ventral aspect with spinal canal at L3 and into the RIGHT L3-L4 neural foramen, question exerting mass effect upon the RIGHT L3 and L4 roots.  IMPRESSION: 4.8 x 4.1 x 4.4 cm diameter soft  tissue mass posterior segment RIGHT upper lobe likely representing a primary pulmonary neoplasm.  Associated RIGHT hilar and mediastinal adenopathy.  Small to moderate pericardial effusion.  Small RIGHT pleural effusion.  3 mm RIGHT upper lobe nodule.  Osseous metastases as above.  RIGHT middle lobe airspace infiltrate.   Electronically Signed   By: Lavonia Dana M.D.   On: 01/05/2015 09:30   Nm Pet Image Initial (pi) Skull Base To Thigh  01/10/2015   CLINICAL DATA:  Initial Treatment strategy for Lung cancer.  EXAM: NUCLEAR MEDICINE PET SKULL BASE TO THIGH  TECHNIQUE: 7.75 mCi F-18 FDG was injected intravenously. Full-ring PET imaging was performed from the skull base to thigh after the radiotracer. CT data was obtained and used for attenuation correction and anatomic localization.  FASTING BLOOD GLUCOSE:  Value: 89 mg/dl  COMPARISON:  None.  FINDINGS: NECK  No hypermetabolic lymph nodes in the neck.  CHEST  Hypermetabolic mass within the right upper lobe measures 4 x 3.7 cm and has an SUV max equal to 14.5. There is a small to moderate right pleural effusion. There is hypermetabolic right hilar, bilateral mediastinal, and left supraclavicular adenopathy. The right hilar lymph node mass measures approximately 3.8 cm and has an SUV max equal to 15.7. Hypermetabolic left paratracheal lymph node measures 1 cm and has an SUV max equal to 7.07. Left supraclavicular lymph node measures 9 mm and has an SUV max equal to 11.9.  ABDOMEN/PELVIS  No abnormal hypermetabolic activity within the liver, pancreas, adrenal glands, or spleen. No hypermetabolic lymph nodes in the abdomen or pelvis.  SKELETON  Extensive multi focal hypermetabolic bone metastases identified. Index lytic lesion involving the left first rib measures approximate 1.9 cm and has an SUV max equal to 8.7. Large destructive lesion involving the T9 vertebra measures 2.5 cm and has an SUV max equal to 14.5. Destructive bone lesion involving the L3 vertebra  measures 3.5 cm and has an SUV max equal to 14.7. Hypermetabolic lytic lesions are noted involving the posterior column of the left acetabulum as well as the left inferior pubic rami.  IMPRESSION: 1. Right upper lobe lung mass is intensely hypermetabolic compatible with primary lung neoplasm. 2. Hypermetabolic ipsilateral mediastinal hypermetabolic adenopathy as well as bilateral mediastinal adenopathy and left supraclavicular adenopathy. 3. Multi focal lytic bone metastasis are identified involving the ribs, spine and bony pelvis.   Electronically Signed   By:  Kerby Moors M.D.   On: 01/10/2015 09:47   Ct Biopsy  01/12/2015   INDICATION: Concern for metastatic lung cancer. Please perform CT-guided biopsy of dominant lytic lesion involving the right-sided the L3 vertebral body for tissue diagnostic purposes  EXAM: CT-GUIDED BIOPSY OF L3 LYTIC LESION  COMPARISON:  PET-CT - 01/10/2015; CT of the chest, abdomen pelvis - 01/05/2015  MEDICATIONS: None  ANESTHESIA/SEDATION: Fentanyl 75 mcg IV; Versed 1.5 mg IV  Sedation time  15 minutes  CONTRAST:  None  COMPLICATIONS: None immediate.  PROCEDURE: Informed consent was obtained from the patient following an explanation of the procedure, risks, benefits and alternatives. The patient understands, agrees and consents for the procedure. All questions were addressed. A time out was performed prior to the initiation of the procedure.  The patient was positioned prone on the CT table and a limited CT was performed for procedural planning demonstrating unchanged appearance of dominant approximately 3.3 x 2.8 cm lytic mass involving primarily the right-sided the L3 vertebral body (image 17, series 2). The procedure was planned. The operative site was prepped and draped in the usual sterile fashion. Appropriate trajectory was confirmed with a 22 gauge spinal needle after the adjacent tissues were anesthetized with 1% Lidocaine with epinephrine.  A 22 gauge spinal needle was  utilized for procedural planning. Next, an 11 gauge coaxial bone biopsy needle was advanced through the right L3 pedicle. Needle position was confirmed with CT imaging. Next, 2 bone lesion biopsies were obtained with the inner 13 gauge coaxial bone biopsy needle. Appropriate positioning was confirmed with CT imaging and finally a completion bone lesion biopsy was obtained with the 11 gauge outer bone marrow device.  The needle was removed intact. Hemostasis was obtained with compression and a dressing was placed. The patient tolerated the procedure well without immediate post procedural complication.  IMPRESSION: Successful CT guided biopsy of L3 lytic lesion via a right L3 transpedicular approach.   Electronically Signed   By: Sandi Mariscal M.D.   On: 01/12/2015 15:24    Labs:  CBC:  Recent Labs  10/13/14 0941 01/03/15 1342 01/12/15 1030 01/24/15 0805  WBC 5.6 5.7 6.5 9.2  HGB 12.9 12.4 12.8 14.1  HCT 38.4 36.5 38.5 41.4  PLT 215.0 250 198 231    COAGS:  Recent Labs  01/12/15 1030 01/24/15 0805  INR 1.14 1.13  APTT 27 23*    BMP:  Recent Labs  10/13/14 0941 01/03/15 1342  NA 140 143  K 4.2 3.6  CL 108  --   CO2 28 25  GLUCOSE 95 115  BUN 22 24.7  CALCIUM 9.5 9.3  CREATININE 0.98 1.0    LIVER FUNCTION TESTS:  Recent Labs  10/13/14 0941 01/03/15 1342  BILITOT 0.6 0.44  AST 14 15  ALT 12 10  ALKPHOS 63 90  PROT 7.1 6.7  ALBUMIN 4.3 3.7    TUMOR MARKERS: No results for input(s): AFPTM, CEA, CA199, CHROMGRNA in the last 8760 hours.  Assessment and Plan:  R Lung mass L3 bx: + metastatic adenocarcinoma Brain/bone mets Mediastinal LAN; L [place] LAN Not enough tissue for genetic testing at time of L3 bx 6/6 Now scheduled for Lung mass bx to be sent for [facility] - genetic testing per Dr Julien Nordmann Risks and Benefits discussed with the patient including, but not limited to bleeding, hemoptysis, respiratory failure requiring intubation, infection, pneumothorax  requiring chest tube placement, stroke from air embolism or even death. All of the patient's questions were  answered, patient is agreeable to proceed. Consent signed and in chart.   Thank you for this interesting consult.  I greatly enjoyed meeting Laura Walter and look forward to participating in their care.  Signed: Seleen Walter A 01/24/2015, 8:28 AM   I spent a total of  20 Minutes   in face to face in clinical consultation, greater than 50% of which was counseling/coordinating care for lung mass bx

## 2015-01-24 NOTE — Progress Notes (Signed)
Dr Annamaria Boots notified of cxr results and ok to d/c home

## 2015-01-24 NOTE — Discharge Instructions (Signed)
Needle Biopsy of Lung, Care After °Refer to this sheet in the next few weeks. These instructions provide you with information on caring for yourself after your procedure. Your health care provider may also give you more specific instructions. Your treatment has been planned according to current medical practices, but problems sometimes occur. Call your health care provider if you have any problems or questions after your procedure. °WHAT TO EXPECT AFTER THE PROCEDURE °· A bandage will be applied over the area where the needle was inserted. You may be asked to apply pressure to the bandage for several minutes to ensure there is minimal bleeding. °· In most cases, you can leave when your needle biopsy procedure is completed. Do not drive yourself home. Someone else should take you home. °· If you received an IV sedative or general anesthetic, you will be taken to a comfortable place to relax while the medicine wears off. °· If you have upcoming travel scheduled, talk to your health care provider about when it is safe to travel by air after the procedure. °HOME CARE INSTRUCTIONS °· Expect to take it easy for the rest of the day. °· Protect the area where you received the needle biopsy by keeping the bandage in place for as long as instructed. °· You may feel some mild pain or discomfort in the area, but this should stop in a day or two. °· Take medicines only as directed by your health care provider. °SEEK MEDICAL CARE IF:  °· You have pain at the biopsy site that worsens or is not helped by medicine. °· You have swelling or drainage at the needle biopsy site. °· You have a fever. °SEEK IMMEDIATE MEDICAL CARE IF:  °· You have new or worsening shortness of breath. °· You have chest pain. °· You are coughing up blood. °· You have bleeding that does not stop with pressure or a bandage. °· You develop light-headedness or fainting. °Document Released: 05/19/2007 Document Revised: 12/06/2013 Document Reviewed:  12/14/2012 °[employer]® Patient Information ©2015 [employer]. This information is not intended to replace advice given to you by your health care provider. Make sure you discuss any questions you have with your health care provider. ° °

## 2015-01-24 NOTE — Sedation Documentation (Signed)
O2 d/c'd,  Moved to stretcher

## 2015-01-24 NOTE — Sedation Documentation (Signed)
o2 2L/[place] started

## 2015-01-24 NOTE — Procedures (Signed)
Successful RUL MASS 18G CORE BX NO COMP STABLE PATH PENDING FULL REPORT IN PACS  

## 2015-01-25 ENCOUNTER — Ambulatory Visit
Admission: RE | Admit: 2015-01-25 | Discharge: 2015-01-25 | Disposition: A | Discharge status: Home / Self Care | Payer: BC Managed Care – PPO | Source: Ambulatory Visit | Attending: Radiation Oncology | Admitting: Radiation Oncology

## 2015-01-25 DIAGNOSIS — C7931 Secondary malignant neoplasm of brain: Secondary | ICD-10-CM | POA: Diagnosis not present

## 2015-01-26 ENCOUNTER — Ambulatory Visit
Admission: RE | Admit: 2015-01-26 | Discharge: 2015-01-26 | Disposition: A | Discharge status: Home / Self Care | Payer: BC Managed Care – PPO | Source: Ambulatory Visit | Attending: Radiation Oncology | Admitting: Radiation Oncology

## 2015-01-26 DIAGNOSIS — C7931 Secondary malignant neoplasm of brain: Secondary | ICD-10-CM | POA: Diagnosis not present

## 2015-01-27 ENCOUNTER — Ambulatory Visit
Admission: RE | Admit: 2015-01-27 | Discharge: 2015-01-27 | Disposition: A | Discharge status: Home / Self Care | Payer: BC Managed Care – PPO | Source: Ambulatory Visit | Attending: Radiation Oncology | Admitting: Radiation Oncology

## 2015-01-27 ENCOUNTER — Encounter: Payer: Self-pay | Admitting: Radiation Oncology

## 2015-01-27 VITALS — BP 143/101 | HR 69 | Temp 98.1°F | Ht 67.0 in | Wt 145.4 lb

## 2015-01-27 DIAGNOSIS — C7931 Secondary malignant neoplasm of brain: Secondary | ICD-10-CM | POA: Diagnosis not present

## 2015-01-27 DIAGNOSIS — C7951 Secondary malignant neoplasm of bone: Secondary | ICD-10-CM | POA: Insufficient documentation

## 2015-01-27 MED ORDER — BIAFINE EX EMUL
CUTANEOUS | Status: DC | PRN
  Administered 2015-01-27: 07:00:00 via TOPICAL

## 2015-01-27 NOTE — Progress Notes (Addendum)
Ms. Sorci reports level 3/10 pain today in the lumbar region.  Gait unsteady and advised to get an assistive device.  Decadron continues resulting in lack of taste, but oral mucosa moist and intact without any evidence of thrush.  Accompanied by friend who is involved in her care.  Pt here for patient teaching.  Pt given Radiation and You booklet and skin care instructions. Reviewed areas of pertinence such as diarrhea, fatigue, hair loss, skin changes and taste changes . Pt able to give teach back of to pat skin, use unscented/gentle soap, have Imodium on hand and drink plenty of water,apply Radiaplex bid to tid and allpy at least 4 hours prior to treatment. Pt demonstrated understanding and verbalizes understanding of information given and will contact nursing with any questions or concerns.  Given Biafine for skin care to apply BID to TID in treatment fields, exclusive of the scalp. Instructed to use baby shampoo and to avoid products in hair while receiving radiation to the brain.  Teachback.  Accompanied by a friend.

## 2015-01-27 NOTE — Progress Notes (Signed)
Department of Radiation Oncology  Phone:  [phone] Fax:        [phone]  Weekly Treatment Note    Name: Laura Walter Date: 01/27/2015 MRN: [id number] DOB: July 30, 1951   Current dose: 9 Gy  Current fraction: 3   MEDICATIONS: Current Outpatient Prescriptions  Medication Sig Dispense Refill  . dexamethasone (DECADRON) 4 MG tablet Take 1 tablet (4 mg total) by mouth 2 (two) times daily. 40 tablet 0  . Dextromethorphan HBr 15 MG/5ML LIQD Take 5 mLs by mouth every 5 (five) hours as needed (cough).    Derrill Memo ON 01/30/2015] emollient (BIAFINE) cream Apply 1 application topically daily.    Marland Kitchen HYDROcodone-homatropine (HYCODAN) 5-1.5 MG/5ML syrup Take 5 mLs by mouth every 6 (six) hours as needed for cough. 120 mL 0  . ibuprofen (ADVIL,MOTRIN) 200 MG tablet Take 200 mg by mouth every 6 (six) hours as needed for moderate pain.    Marland Kitchen oxyCODONE-acetaminophen (PERCOCET/ROXICET) 5-325 MG per tablet Take 1 tablet by mouth every 6 (six) hours as needed for severe pain. 40 tablet 0  . scopolamine (TRANSDERM-SCOP) 1.5 MG Place 1 patch (1.5 mg total) onto the skin every 3 (three) days. (Patient taking differently: Place 1 patch onto the skin every 3 (three) days as needed (traveling by plane). ) 2 patch 0   No current facility-administered medications for this encounter.     ALLERGIES: Sulfonamide derivatives   LABORATORY DATA:  Lab Results  Component Value Date   WBC 9.2 01/24/2015   HGB 14.1 01/24/2015   HCT 41.4 01/24/2015   MCV 88.8 01/24/2015   PLT 231 01/24/2015   Lab Results  Component Value Date   NA 143 01/03/2015   K 3.6 01/03/2015   CL 108 10/13/2014   CO2 25 01/03/2015   Lab Results  Component Value Date   ALT 10 01/03/2015   AST 15 01/03/2015   ALKPHOS 90 01/03/2015   BILITOT 0.44 01/03/2015     NARRATIVE: Laura Walter was seen today for weekly treatment management. The chart was checked and the patient's films were reviewed.  Laura Walter reports level 3/10  pain today in the lumbar region.  Gait unsteady and advised to get an assistive device.  Decadron continues resulting in lack of taste, but oral mucosa moist and intact without any evidence of thrush.  Accompanied by friend who is involved in her care.  Pt here for patient teaching.  Pt given Radiation and You booklet and skin care instructions. Reviewed areas of pertinence such as diarrhea, fatigue, hair loss, skin changes and taste changes . Pt able to give teach back of to pat skin, use unscented/gentle soap, have Imodium on hand and drink plenty of water,apply Radiaplex bid to tid and allpy at least 4 hours prior to treatment. Pt demonstrated understanding and verbalizes understanding of information given and will contact nursing with any questions or concerns.  Given Biafine for skin care to apply BID to TID in treatment fields, exclusive of the scalp. Instructed to use baby shampoo and to avoid products in hair while receiving radiation to the brain.  Teachback.  Accompanied by a friend.      PHYSICAL EXAMINATION: height is '5\' 7"'$  (1.702 m) and weight is 145 lb 6.4 oz (65.953 kg). Her temperature is 98.1 F (36.7 C). Her blood pressure is 143/101 and her pulse is 69.        ASSESSMENT: The patient is doing satisfactorily with treatment.  PLAN: We will continue with the  patient's radiation treatment as planned.

## 2015-01-30 ENCOUNTER — Telehealth: Payer: Self-pay | Admitting: *Deleted

## 2015-01-30 ENCOUNTER — Ambulatory Visit
Admission: RE | Admit: 2015-01-30 | Discharge: 2015-01-30 | Disposition: A | Discharge status: Home / Self Care | Payer: BC Managed Care – PPO | Source: Ambulatory Visit | Attending: Radiation Oncology | Admitting: Radiation Oncology

## 2015-01-30 DIAGNOSIS — C7931 Secondary malignant neoplasm of brain: Secondary | ICD-10-CM | POA: Diagnosis not present

## 2015-01-30 NOTE — Addendum Note (Signed)
Encounter addended by: Doreen Beam, RN on: 01/30/2015 12:55 PM<BR>     Documentation filed: Medications

## 2015-01-30 NOTE — Telephone Encounter (Signed)
Returned patient call, she got very sick and nauseated Friday night after her rad tx, has had Zofran called in and that helped stated pateint, asked if she could take her zofran before treatment today before her radiation, she is afraid of getting nauseated again, told her she could, rx reads 3x day so she could take medication every 8 hours as needed for nausea, 1:46 PM

## 2015-01-31 ENCOUNTER — Ambulatory Visit
Admission: RE | Admit: 2015-01-31 | Discharge: 2015-01-31 | Disposition: A | Discharge status: Home / Self Care | Payer: BC Managed Care – PPO | Source: Ambulatory Visit | Attending: Radiation Oncology | Admitting: Radiation Oncology

## 2015-01-31 DIAGNOSIS — C7931 Secondary malignant neoplasm of brain: Secondary | ICD-10-CM | POA: Diagnosis not present

## 2015-02-01 ENCOUNTER — Ambulatory Visit
Admission: RE | Admit: 2015-02-01 | Discharge: 2015-02-01 | Disposition: A | Discharge status: Home / Self Care | Payer: BC Managed Care – PPO | Source: Ambulatory Visit | Attending: Radiation Oncology | Admitting: Radiation Oncology

## 2015-02-01 ENCOUNTER — Other Ambulatory Visit: Payer: BC Managed Care – PPO

## 2015-02-01 ENCOUNTER — Ambulatory Visit: Payer: BC Managed Care – PPO | Admitting: Internal Medicine

## 2015-02-01 DIAGNOSIS — C7931 Secondary malignant neoplasm of brain: Secondary | ICD-10-CM | POA: Diagnosis not present

## 2015-02-02 ENCOUNTER — Telehealth: Payer: Self-pay | Admitting: *Deleted

## 2015-02-02 ENCOUNTER — Encounter (HOSPITAL_COMMUNITY): Payer: Self-pay

## 2015-02-02 ENCOUNTER — Other Ambulatory Visit: Payer: Self-pay | Admitting: Internal Medicine

## 2015-02-02 ENCOUNTER — Ambulatory Visit
Admission: RE | Admit: 2015-02-02 | Discharge: 2015-02-02 | Disposition: A | Discharge status: Home / Self Care | Payer: BC Managed Care – PPO | Source: Ambulatory Visit | Attending: Radiation Oncology | Admitting: Radiation Oncology

## 2015-02-02 DIAGNOSIS — C7931 Secondary malignant neoplasm of brain: Secondary | ICD-10-CM | POA: Diagnosis not present

## 2015-02-02 DIAGNOSIS — C3491 Malignant neoplasm of unspecified part of right bronchus or lung: Secondary | ICD-10-CM

## 2015-02-02 MED ORDER — DEXAMETHASONE 4 MG PO TABS: 4.0000 mg | ORAL_TABLET | Freq: Two times a day (BID) | ORAL | Status: DC

## 2015-02-02 NOTE — Telephone Encounter (Signed)
Oncology Nurse Navigator Documentation  Oncology Nurse Navigator Flowsheets 02/02/2015  Navigator Encounter Type Other/Ms. Kramm had a question about her medication.  Her decadron needs to be refilled.  I clarified with Dr. Julien Nordmann and he refilled.  He will start to ween medication after he sees her next week.  I called and left her a vm message   Patient Visit Type -  Treatment Phase Treatment  Coordination of Care Other  Time Spent with Patient 15

## 2015-02-02 NOTE — Telephone Encounter (Signed)
Oncology Nurse Navigator Documentation  Oncology Nurse Navigator Flowsheets 02/02/2015  Referral date to RadOnc/MedOnc -  Navigator Encounter Type Coordination of care/telephone  Patient Visit Type Telephone/Per Dr. Julien Nordmann, I called patient to cancel appt with him tomorrow.  I explained the test results are not back yet and we do not have any new discussion.  She was ok with cancelled appt.  I or scheduling will call with follow up appt  Treatment Phase Treatment  Barriers/Navigation Needs Update  Coordination of Care MD Appointments  Support Groups/Services Friends and Family  Time Spent with Patient 15

## 2015-02-03 ENCOUNTER — Ambulatory Visit (HOSPITAL_BASED_OUTPATIENT_CLINIC_OR_DEPARTMENT_OTHER): Payer: BC Managed Care – PPO | Admitting: Internal Medicine

## 2015-02-03 ENCOUNTER — Ambulatory Visit
Admission: RE | Admit: 2015-02-03 | Discharge: 2015-02-03 | Disposition: A | Discharge status: Home / Self Care | Payer: BC Managed Care – PPO | Source: Ambulatory Visit | Attending: Radiation Oncology | Admitting: Radiation Oncology

## 2015-02-03 ENCOUNTER — Telehealth: Payer: Self-pay | Admitting: Internal Medicine

## 2015-02-03 ENCOUNTER — Other Ambulatory Visit: Payer: Self-pay | Admitting: *Deleted

## 2015-02-03 ENCOUNTER — Other Ambulatory Visit: Payer: BC Managed Care – PPO

## 2015-02-03 ENCOUNTER — Encounter: Payer: Self-pay | Admitting: *Deleted

## 2015-02-03 ENCOUNTER — Encounter: Payer: Self-pay | Admitting: Radiation Oncology

## 2015-02-03 ENCOUNTER — Encounter: Payer: Self-pay | Admitting: Internal Medicine

## 2015-02-03 ENCOUNTER — Ambulatory Visit: Payer: BC Managed Care – PPO | Admitting: Internal Medicine

## 2015-02-03 VITALS — BP 127/88 | HR 78 | Resp 16 | Wt 138.5 lb

## 2015-02-03 VITALS — BP 144/93 | HR 77 | Temp 97.8°F | Resp 18 | Ht 67.0 in | Wt 141.3 lb

## 2015-02-03 DIAGNOSIS — C7951 Secondary malignant neoplasm of bone: Secondary | ICD-10-CM

## 2015-02-03 DIAGNOSIS — C7931 Secondary malignant neoplasm of brain: Secondary | ICD-10-CM | POA: Diagnosis not present

## 2015-02-03 DIAGNOSIS — C778 Secondary and unspecified malignant neoplasm of lymph nodes of multiple regions: Secondary | ICD-10-CM

## 2015-02-03 DIAGNOSIS — C3411 Malignant neoplasm of upper lobe, right bronchus or lung: Secondary | ICD-10-CM

## 2015-02-03 DIAGNOSIS — C3491 Malignant neoplasm of unspecified part of right bronchus or lung: Secondary | ICD-10-CM

## 2015-02-03 MED ORDER — UNABLE TO FIND: 1.0000 | Status: DC | PRN

## 2015-02-03 MED ORDER — AFATINIB DIMALEATE 40 MG PO TABS: 40.0000 mg | ORAL_TABLET | Freq: Every day | ORAL | Status: DC

## 2015-02-03 NOTE — Progress Notes (Signed)
Oncology Nurse Navigator Documentation  Oncology Nurse Navigator Flowsheets 02/03/2015  Navigator Encounter Type Telephone  Patient Visit Type Follow-up/I received email from Dr. Julien Nordmann that he would like to see patient tomorrow at 11 am.  I called and spoke with Butch Penny.  She will be here at 11.    Treatment Phase Treatment  Coordination of Care MD Appointments  Time Spent with Patient 15

## 2015-02-03 NOTE — Patient Instructions (Addendum)
Denosumab injection What is this medicine? DENOSUMAB (den oh sue mab) slows bone breakdown. Prolia is used to treat osteoporosis in women after menopause and in men. Delton See is used to prevent bone fractures and other bone problems caused by cancer bone metastases. Delton See is also used to treat giant cell tumor of the bone. This medicine may be used for other purposes; ask your health care provider or pharmacist if you have questions. COMMON BRAND NAME(S): Prolia, XGEVA What should I tell my health care provider before I take this medicine? They need to know if you have any of these conditions: -dental disease -eczema -infection or history of infections -kidney disease or on dialysis -low blood calcium or vitamin D -malabsorption syndrome -scheduled to have surgery or tooth extraction -taking medicine that contains denosumab -thyroid or parathyroid disease -an unusual reaction to denosumab, other medicines, foods, dyes, or preservatives -pregnant or trying to get pregnant -breast-feeding How should I use this medicine? This medicine is for injection under the skin. It is given by a health care professional in a hospital or clinic setting. If you are getting Prolia, a special MedGuide will be given to you by the pharmacist with each prescription and refill. Be sure to read this information carefully each time. For Prolia, talk to your pediatrician regarding the use of this medicine in children. Special care may be needed. For Delton See, talk to your pediatrician regarding the use of this medicine in children. While this drug may be prescribed for children as young as 13 years for selected conditions, precautions do apply. Overdosage: If you think you've taken too much of this medicine contact a poison control center or emergency room at once. Overdosage: If you think you have taken too much of this medicine contact a poison control center or emergency room at once. NOTE: This medicine is only for  you. Do not share this medicine with others. What if I miss a dose? It is important not to miss your dose. Call your doctor or health care professional if you are unable to keep an appointment. What may interact with this medicine? Do not take this medicine with any of the following medications: -other medicines containing denosumab This medicine may also interact with the following medications: -medicines that suppress the immune system -medicines that treat cancer -steroid medicines like prednisone or cortisone This list may not describe all possible interactions. Give your health care provider a list of all the medicines, herbs, non-prescription drugs, or dietary supplements you use. Also tell them if you smoke, drink alcohol, or use illegal drugs. Some items may interact with your medicine. What should I watch for while using this medicine? Visit your doctor or health care professional for regular checks on your progress. Your doctor or health care professional may order blood tests and other tests to see how you are doing. Call your doctor or health care professional if you get a cold or other infection while receiving this medicine. Do not treat yourself. This medicine may decrease your body's ability to fight infection. You should make sure you get enough calcium and vitamin D while you are taking this medicine, unless your doctor tells you not to. Discuss the foods you eat and the vitamins you take with your health care professional. See your dentist regularly. Brush and floss your teeth as directed. Before you have any dental work done, tell your dentist you are receiving this medicine. Do not become pregnant while taking this medicine or for 5 months after stopping  it. Women should inform their doctor if they wish to become pregnant or think they might be pregnant. There is a potential for serious side effects to an unborn child. Talk to your health care professional or pharmacist for more  information. What side effects may I notice from receiving this medicine? Side effects that you should report to your doctor or health care professional as soon as possible: -allergic reactions like skin rash, itching or hives, swelling of the face, lips, or tongue -breathing problems -chest pain -fast, irregular heartbeat -feeling faint or lightheaded, falls -fever, chills, or any other sign of infection -muscle spasms, tightening, or twitches -numbness or tingling -skin blisters or bumps, or is dry, peels, or red -slow healing or unexplained pain in the mouth or jaw -unusual bleeding or bruising Side effects that usually do not require medical attention (Report these to your doctor or health care professional if they continue or are bothersome.): -muscle pain -stomach upset, gas This list may not describe all possible side effects. Call your doctor for medical advice about side effects. You may report side effects to FDA at 1-800-FDA-1088. Where should I keep my medicine? This medicine is only given in a clinic, doctor's office, or other health care setting and will not be stored at home. NOTE: This sheet is a summary. It may not cover all possible information. If you have questions about this medicine, talk to your doctor, pharmacist, or health care provider.  2015, Elsevier/Gold Standard. (2012-01-20 12:37:47)   Afatinib tablets What is this medicine? AFATINIB (a FA ti nib) is a chemotherapy drug. It targets a specific protein within cancer cells and stops the cancer cells from growing. This medicine is used to treat non-small cell lung cancer. This medicine may be used for other purposes; ask your health care provider or pharmacist if you have questions. COMMON BRAND NAME(S): GILOTRIF What should I tell my health care provider before I take this medicine? They need to know if you have any of these conditions: -eye disease, vision problems, or if you wear contact lenses -heart  disease -kidney disease -liver disease -lung or breathing disease -an unusual or allergic reaction to afatinib, other medicines, foods, dyes, or preservatives -pregnant or trying to get pregnant -breast-feeding How should I use this medicine? Take by mouth with a glass of water. Follow the directions on the prescription label. Take this medicine on an empty stomach, at least 1 hour before or 2 hours after food. Do not take with food. Use exactly as directed. Take your medicine at regular intervals. Do not take your medicine more often than directed. Talk to your pediatrician regarding the use of this medicine in children. Special care may be needed. Overdosage: If you think you've taken too much of this medicine contact a poison control center or emergency room at once. Overdosage: If you think you have taken too much of this medicine contact a poison control center or emergency room at once. NOTE: This medicine is only for you. Do not share this medicine with others. What if I miss a dose? Take your missed dose as soon as you remember. If your next dose is to be taken in less than 12 hours, then do not take the missed dose. Take the next dose at your regular time. Do not take double or extra doses. What may interact with this medicine? -amiodarone -carbamazepine -certain medicines for fungal infections like ketoconazole and itraconazole -cyclosporine A -erythromycin -grapefruit juice -nelfinavir -phenobarbital -phenytoin -quinidine -rifampicin -ritonavir -saquinavir -  St. John's wort -tacrolimus -verapamil This list may not describe all possible interactions. Give your health care provider a list of all the medicines, herbs, non-prescription drugs, or dietary supplements you use. Also tell them if you smoke, drink alcohol, or use illegal drugs. Some items may interact with your medicine. What should I watch for while using this medicine? Visit your doctor for regular check ups.  Report any side effects. Continue your course of treatment unless your doctor tells you to stop. You will need blood work done while you are taking this medicine. If you experience any of the following, contact your health care provider: eye irritation; rash; severe or continuing diarrhea, nausea, decreased appetite, or vomiting; or if your breathing gets worse or you develop shortness of breath or cough. Do not become pregnant while taking this medicine or for 2 weeks after stopping it. Women should inform their doctor if they wish to become pregnant or think they might be pregnant. There is a potential for serious side effects to an unborn child. Talk to your health care professional or pharmacist for more information. Do not breast-feed an infant while taking this medicine. What side effects may I notice from receiving this medicine? Side effects that you should report to your doctor or health care professional as soon as possible: -allergic reactions like skin rash, itching or hives, swelling of the face, lips, or tongue -bloody or black tarry stools -eye irritation -eye pain -fever -mouth sores -problems related to breathing including shortness of breath or cough -severe or persistent diarrhea, nausea, vomiting, or loss of appetite -spitting up blood or brown material that looks like coffee grounds -unusual bleeding or bruising Side effects that usually do not require medical attention (Report these to your doctor or health care professional if they continue or are bothersome.): -acne -diarrhea -dry skin -itching -loss of appetite -nausea -weak or tired -weight loss This list may not describe all possible side effects. Call your doctor for medical advice about side effects. You may report side effects to FDA at 1-800-FDA-1088. Where should I keep my medicine? Keep out of the reach of children. Store between 20 and 25 degrees C (68 and 77 degrees F). Throw away any unused medicine  after the expiration date. NOTE: This sheet is a summary. It may not cover all possible information. If you have questions about this medicine, talk to your doctor, pharmacist, or health care provider.  2015, Elsevier/Gold Standard. (2012-03-12 14:25:02)

## 2015-02-03 NOTE — Progress Notes (Signed)
Department of Radiation Oncology  Phone:  [phone] Fax:        [phone]  Weekly Treatment Note    Name: Laura Walter Date: 02/03/2015 MRN: [id number] DOB: [birth year]   Current dose: 24 Gy  Current fraction: 8   MEDICATIONS: Current Outpatient Prescriptions  Medication Sig Dispense Refill  . afatinib dimaleate (GILOTRIF) 40 MG tablet Take 1 tablet (40 mg total) by mouth daily. Take on an empty stomach 1hr before or 2 hrs after meals. 30 tablet 2  . dexamethasone (DECADRON) 4 MG tablet TAKE 1 TABLET (4 MG TOTAL) BY MOUTH 2 (TWO) TIMES DAILY. 40 tablet 0  . dexamethasone (DECADRON) 4 MG tablet Take 1 tablet (4 mg total) by mouth 2 (two) times daily. 40 tablet 0  . Dextromethorphan HBr 15 MG/5ML LIQD Take 5 mLs by mouth every 5 (five) hours as needed (cough).    Marland Kitchen emollient (BIAFINE) cream Apply 1 application topically daily.    Marland Kitchen HYDROcodone-homatropine (HYCODAN) 5-1.5 MG/5ML syrup Take 5 mLs by mouth every 6 (six) hours as needed for cough. 120 mL 0  . ibuprofen (ADVIL,MOTRIN) 200 MG tablet Take 200 mg by mouth every 6 (six) hours as needed for moderate pain.    . Misc. Devices (CANE) MISC by Does not apply route.    . ondansetron (ZOFRAN) 4 MG tablet Take 4 mg by mouth every 8 (eight) hours as needed for nausea or vomiting.    Marland Kitchen oxyCODONE-acetaminophen (PERCOCET/ROXICET) 5-325 MG per tablet Take 1 tablet by mouth every 6 (six) hours as needed for severe pain. 40 tablet 0  . scopolamine (TRANSDERM-SCOP) 1.5 MG Place 1 patch (1.5 mg total) onto the skin every 3 (three) days. (Patient taking differently: Place 1 patch onto the skin every 3 (three) days as needed (traveling by plane). ) 2 patch 0  . UNABLE TO FIND 1 each by Does not apply route as needed (Cane :    Per medical necessity use with ambulation). 1 each 1   No current facility-administered medications for this encounter.     ALLERGIES: Sulfonamide derivatives   LABORATORY DATA:  Lab Results  Component Value  Date   WBC 9.2 01/24/2015   HGB 14.1 01/24/2015   HCT 41.4 01/24/2015   MCV 88.8 01/24/2015   PLT 231 01/24/2015   Lab Results  Component Value Date   NA 143 01/03/2015   K 3.6 01/03/2015   CL 108 10/13/2014   CO2 25 01/03/2015   Lab Results  Component Value Date   ALT 10 01/03/2015   AST 15 01/03/2015   ALKPHOS 90 01/03/2015   BILITOT 0.44 01/03/2015     NARRATIVE: Laura Walter was seen today for weekly treatment management. The chart was checked and the patient's films were reviewed.  Weight and vitals are stable. Reports patient in spine and left hip are much less since starting radiation therapy. Rates overall pain 2 on a scale of 0-10. Reports less otc advil is required to manage pain now. Denies headache or dizziness. Reports nausea and vomiting are well managed with Zofran. Reports fatigue. Denies vision changes or tinnitus.  PHYSICAL EXAMINATION: weight is 138 lb 8 oz (62.823 kg). Her blood pressure is 127/88 and her pulse is 78. Her respiration is 16.        ASSESSMENT: The patient is doing satisfactorily with treatment.  PLAN: We will continue with the patient's radiation treatment as planned.    ------------------------------------------------  Jodelle Gross, MD, PhD  This document  serves as a record of services personally performed by Kyung Rudd, MD. It was created on his behalf by Derek Mound, a trained medical scribe. The creation of this record is based on the scribe's personal observations and the provider's statements to them. This document has been checked and approved by the attending provider.

## 2015-02-03 NOTE — Telephone Encounter (Signed)
Gave and printed appt sched and avs for pt for July  °

## 2015-02-03 NOTE — Progress Notes (Signed)
[facility] [phone]   [phone]  OFFICE PROGRESS NOTE  [name], MD [address] [place] [address]  DIAGNOSIS: Stage IV (T2a, N3, M1b) Non-small lung cancer, adenocarcinoma with positive EGFR mutation with deletion in exon 19, presented with large right upper lobe lung mass in addition to bilateral mediastinal and left supraclavicular lymphadenopathy as well as multiple bone and brain lesions diagnosed in June 2016  PRIOR THERAPY: Whole brain irradiation under the care of Dr. Lisbeth Renshaw  CURRENT THERAPY: Gilotrif 40 mg by mouth daily. This is expected to be started in the next few days.  INTERVAL HISTORY: Laura Walter 64 y.o. female returns to the clinic today for follow-up visit accompanied by her partner and 2 friends. The patient was found recently to have multiple brain metastases and she is currently undergoing whole brain irradiation under the care of Dr. Lisbeth Renshaw. She has few more fractions of this treatment. She is feeling much better except for fatigue. She denied having any significant chest pain, shortness of breath, cough or hemoptysis. She continues to have mild back pain. She denied having any significant nausea or vomiting, no fever or chills. The patient underwent another CT-guided core biopsy biopsy right upper lobe lung mass and this was sent for molecular biomarker testing by [facility]. The report was received recently and it showed positive EGFR mutation with deletion 19. She is here today for evaluation and discussion of her treatment options.  MEDICAL HISTORY: Past Medical History  Diagnosis Date  . Pneumonia 2009 & 2011  . Allergy     seasonal  . H/O reactive hypoglycemia   . Osteopenia   . IBS (irritable bowel syndrome)   . Brain cancer 01/13/15    MRI multiple small brain mets 10 individual lesions  . Bone cancer 01/10/15 PET    lytic lesion lt 1st rib,T9,L3 Lt acetabulum  . Bronchitis      ALLERGIES:  is allergic to sulfonamide derivatives.  MEDICATIONS:  Current Outpatient Prescriptions  Medication Sig Dispense Refill  . dexamethasone (DECADRON) 4 MG tablet TAKE 1 TABLET (4 MG TOTAL) BY MOUTH 2 (TWO) TIMES DAILY. 40 tablet 0  . dexamethasone (DECADRON) 4 MG tablet Take 1 tablet (4 mg total) by mouth 2 (two) times daily. 40 tablet 0  . Dextromethorphan HBr 15 MG/5ML LIQD Take 5 mLs by mouth every 5 (five) hours as needed (cough).    Marland Kitchen emollient (BIAFINE) cream Apply 1 application topically daily.    Marland Kitchen HYDROcodone-homatropine (HYCODAN) 5-1.5 MG/5ML syrup Take 5 mLs by mouth every 6 (six) hours as needed for cough. 120 mL 0  . ibuprofen (ADVIL,MOTRIN) 200 MG tablet Take 200 mg by mouth every 6 (six) hours as needed for moderate pain.    Marland Kitchen ondansetron (ZOFRAN) 4 MG tablet Take 4 mg by mouth every 8 (eight) hours as needed for nausea or vomiting.    Marland Kitchen oxyCODONE-acetaminophen (PERCOCET/ROXICET) 5-325 MG per tablet Take 1 tablet by mouth every 6 (six) hours as needed for severe pain. 40 tablet 0  . scopolamine (TRANSDERM-SCOP) 1.5 MG Place 1 patch (1.5 mg total) onto the skin every 3 (three) days. (Patient taking differently: Place 1 patch onto the skin every 3 (three) days as needed (traveling by plane). ) 2 patch 0   No current facility-administered medications for this visit.    SURGICAL HISTORY:  Past Surgical History  Procedure Laterality Date  . Colonoscopy  2009  Negative; [facility]  . Tonsillectomy and adenoidectomy    . Wisdom tooth extraction      REVIEW OF SYSTEMS:  Constitutional: positive for fatigue Eyes: negative Ears, nose, mouth, throat, and face: negative Respiratory: negative Cardiovascular: negative Gastrointestinal: negative Genitourinary:negative Integument/breast: negative Hematologic/lymphatic: negative Musculoskeletal:positive for back pain Neurological: negative Behavioral/Psych: negative Endocrine: negative Allergic/Immunologic:  negative   PHYSICAL EXAMINATION: General appearance: alert, cooperative, flushed and no distress Head: Normocephalic, without obvious abnormality, atraumatic Neck: no adenopathy, no JVD, supple, symmetrical, trachea midline and thyroid not enlarged, symmetric, no tenderness/mass/nodules Lymph nodes: Cervical, supraclavicular, and axillary nodes normal. Resp: clear to auscultation bilaterally Back: Tenderness to palpation at the lower thoracic and lumbar vertebrae. Cardio: regular rate and rhythm, S1, S2 normal, no murmur, click, rub or gallop GI: soft, non-tender; bowel sounds normal; no masses,  no organomegaly Extremities: extremities normal, atraumatic, no cyanosis or edema Neurologic: Alert and oriented X 3, normal strength and tone. Normal symmetric reflexes. Normal coordination and gait  ECOG PERFORMANCE STATUS: 1 - Symptomatic but completely ambulatory  Blood pressure 144/93, pulse 77, temperature 97.8 F (36.6 C), temperature source Oral, resp. rate 18, height _0  (1.702 m), weight 141 lb 4.8 oz (64.093 kg), SpO2 98 %.  LABORATORY DATA: Lab Results  Component Value Date   WBC 9.2 01/24/2015   HGB 14.1 01/24/2015   HCT 41.4 01/24/2015   MCV 88.8 01/24/2015   PLT 231 01/24/2015      Chemistry      Component Value Date/Time   NA 143 01/03/2015 1342   NA 140 10/13/2014 0941   K 3.6 01/03/2015 1342   K 4.2 10/13/2014 0941   CL 108 10/13/2014 0941   CO2 25 01/03/2015 1342   CO2 28 10/13/2014 0941   BUN 24.7 01/03/2015 1342   BUN 22 10/13/2014 0941   CREATININE 1.0 01/03/2015 1342   CREATININE 0.98 10/13/2014 0941   CREATININE 0.83 11/02/2013 0832      Component Value Date/Time   CALCIUM 9.3 01/03/2015 1342   CALCIUM 9.5 10/13/2014 0941   ALKPHOS 90 01/03/2015 1342   ALKPHOS 63 10/13/2014 0941   AST 15 01/03/2015 1342   AST 14 10/13/2014 0941   ALT 10 01/03/2015 1342   ALT 12 10/13/2014 0941   BILITOT 0.44 01/03/2015 1342   BILITOT 0.6 10/13/2014 0941        RADIOGRAPHIC STUDIES: Dg Chest 1 View  01/24/2015   CLINICAL DATA:  Status post right lung biopsy  EXAM: CHEST  1 VIEW  COMPARISON:  01/05/2015  FINDINGS: Right upper lobe mass lesion is again identified similar to that seen on the prior exam. No post biopsy pneumothorax is noted. Left lung remains clear. Cardiac shadow is stable.  IMPRESSION: No evidence of post biopsy pneumothorax on the right.   Electronically Signed   By: Inez Catalina M.D.   On: 01/24/2015 11:16   Ct Chest W Contrast  01/05/2015   CLINICAL DATA:  Osseous metastases on recent MRI lumbar spine, history irritable bowel syndrome  EXAM: CT CHEST, ABDOMEN, AND PELVIS WITH CONTRAST  TECHNIQUE: Multidetector CT imaging of the chest, abdomen and pelvis was performed following the standard protocol during bolus administration of intravenous contrast. Sagittal and coronal MPR images reconstructed from axial data set.  CONTRAST:  134m OMNIPAQUE IOHEXOL 300 MG/ML SOLN IV. Dilute oral contrast.  COMPARISON:  None  FINDINGS: CT CHEST FINDINGS  Aorta normal caliber.  Thoracic vascular structures grossly patent on nondedicated exam.  Small to moderate-sized pericardial effusion.  Enlarged thoracic nodes including:  Subcarinal level 7 node 12 mm short axis image 28.  RIGHT hilar 10R node 15 mm short axis image 26.  RIGHT hilar 10R node 14 mm short axis image 25.  11 mm precarinal node level 4.  Additional smaller sub carinal and RIGHT hilar nodes.  Upper paratracheal level 2 node 8 mm short axis image 18.  Mass identified posterior segment RIGHT upper lobe 4.8 x 4.1 x 4.4 cm image 21 like representing a primary pulmonary neoplasm.  Mass abuts pleural surface laterally with associated RIGHT upper lobe atelectasis superiorly.  Additional soft tissue extends to RIGHT hilum question atelectasis versus tumor extension.  Associated peribronchial thickening in RIGHT upper lobe and scattered interstitial thickening.  Tiny RIGHT upper lobe nodule 3 mm  diameter image 24.  RIGHT pleural effusion with compressive atelectasis of posterior RIGHT lung.  Focal airspace infiltrate RIGHT middle lobe.  LEFT lung clear.  No pneumothorax.  Nonspecific small sclerotic focus posterior LEFT fifth rib.  Lytic destructive lesion with minimal superior endplate height loss at T9 vertebral body.  Probable lytic lesion posterior RIGHT fifth rib.  CT ABDOMEN AND PELVIS FINDINGS  Two hepatic cysts largest lateral segment LEFT lobe 3.7 x 2.8 x 3.5 cm.  Remainder of liver, spleen, pancreas, kidneys, and adrenal glands normal.  Normal appendix, bladder, uterus, and ovaries.  Stomach and bowel loops normal appearance.  No mass, adenopathy, free air, free fluid or hernia.  Lytic metastatic lesions within L2 and L3 vertebral bodies as well as in LEFT ischium.  Abnormal soft tissue/tumor extends into the RIGHT ventral aspect with spinal canal at L3 and into the RIGHT L3-L4 neural foramen, question exerting mass effect upon the RIGHT L3 and L4 roots.  IMPRESSION: 4.8 x 4.1 x 4.4 cm diameter soft tissue mass posterior segment RIGHT upper lobe likely representing a primary pulmonary neoplasm.  Associated RIGHT hilar and mediastinal adenopathy.  Small to moderate pericardial effusion.  Small RIGHT pleural effusion.  3 mm RIGHT upper lobe nodule.  Osseous metastases as above.  RIGHT middle lobe airspace infiltrate.   Electronically Signed   By: Lavonia Dana M.D.   On: 01/05/2015 09:30   Mr Jeri Cos ZY Contrast  01/13/2015   CLINICAL DATA:  64 year old female with new diagnosis of metastatic lung cancer. Staging. Subsequent encounter.  EXAM: MRI HEAD WITHOUT AND WITH CONTRAST  TECHNIQUE: Multiplanar, multiecho pulse sequences of the brain and surrounding structures were obtained without and with intravenous contrast.  CONTRAST:  74m MULTIHANCE GADOBENATE DIMEGLUMINE 529 MG/ML IV SOLN  COMPARISON:  PET-CT 01/10/2015  FINDINGS: Multiple small enhancing bilateral brain metastases. No definite  leptomeningeal disease. At least 10 individual metastases are identified, ranging from punctate to 9 mm diameter. The left cerebellum and both cerebral hemispheres are affected. The right insula is affected. The deep gray matter nuclei and brainstem appear spared at this time. Very mild associated cerebral edema at this time. No intracranial mass effect.  No acute intracranial hemorrhage identified. No ventriculomegaly. No restricted diffusion or evidence of acute infarction. Negative pituitary, cervicomedullary junction and visualized cervical spinal cord.  Heterogeneous bone marrow signal in the skull, no definite calvarial metastasis.  Other scattered nonspecific cerebral white matter T2 and FLAIR hyperintensity. Major intracranial vascular flow voids are preserved. There is mild intracranial artery dolichoectasia. Visible internal auditory structures appear normal. Visualized paranasal sinuses and mastoids are clear. Orbits soft tissues appear normal. Visualized scalp soft tissues are within normal limits.  IMPRESSION: 1. Positive for multiple  small brain metastases. At least 10 individual lesions are present, ranging from punctate to 9 mm diameter. 2. Minimal cerebral edema and no intracranial mass effect at this time.  These results will be called to the ordering clinician or representative by the Radiologist Assistant, and communication documented in the PACS or zVision Dashboard.   Electronically Signed   By: Genevie Ann M.D.   On: 01/13/2015 08:09   Ct Abdomen Pelvis W Contrast  01/05/2015   CLINICAL DATA:  Osseous metastases on recent MRI lumbar spine, history irritable bowel syndrome  EXAM: CT CHEST, ABDOMEN, AND PELVIS WITH CONTRAST  TECHNIQUE: Multidetector CT imaging of the chest, abdomen and pelvis was performed following the standard protocol during bolus administration of intravenous contrast. Sagittal and coronal MPR images reconstructed from axial data set.  CONTRAST:  157m OMNIPAQUE IOHEXOL 300  MG/ML SOLN IV. Dilute oral contrast.  COMPARISON:  None  FINDINGS: CT CHEST FINDINGS  Aorta normal caliber.  Thoracic vascular structures grossly patent on nondedicated exam.  Small to moderate-sized pericardial effusion.  Enlarged thoracic nodes including:  Subcarinal level 7 node 12 mm short axis image 28.  RIGHT hilar 10R node 15 mm short axis image 26.  RIGHT hilar 10R node 14 mm short axis image 25.  11 mm precarinal node level 4.  Additional smaller sub carinal and RIGHT hilar nodes.  Upper paratracheal level 2 node 8 mm short axis image 18.  Mass identified posterior segment RIGHT upper lobe 4.8 x 4.1 x 4.4 cm image 21 like representing a primary pulmonary neoplasm.  Mass abuts pleural surface laterally with associated RIGHT upper lobe atelectasis superiorly.  Additional soft tissue extends to RIGHT hilum question atelectasis versus tumor extension.  Associated peribronchial thickening in RIGHT upper lobe and scattered interstitial thickening.  Tiny RIGHT upper lobe nodule 3 mm diameter image 24.  RIGHT pleural effusion with compressive atelectasis of posterior RIGHT lung.  Focal airspace infiltrate RIGHT middle lobe.  LEFT lung clear.  No pneumothorax.  Nonspecific small sclerotic focus posterior LEFT fifth rib.  Lytic destructive lesion with minimal superior endplate height loss at T9 vertebral body.  Probable lytic lesion posterior RIGHT fifth rib.  CT ABDOMEN AND PELVIS FINDINGS  Two hepatic cysts largest lateral segment LEFT lobe 3.7 x 2.8 x 3.5 cm.  Remainder of liver, spleen, pancreas, kidneys, and adrenal glands normal.  Normal appendix, bladder, uterus, and ovaries.  Stomach and bowel loops normal appearance.  No mass, adenopathy, free air, free fluid or hernia.  Lytic metastatic lesions within L2 and L3 vertebral bodies as well as in LEFT ischium.  Abnormal soft tissue/tumor extends into the RIGHT ventral aspect with spinal canal at L3 and into the RIGHT L3-L4 neural foramen, question exerting mass  effect upon the RIGHT L3 and L4 roots.  IMPRESSION: 4.8 x 4.1 x 4.4 cm diameter soft tissue mass posterior segment RIGHT upper lobe likely representing a primary pulmonary neoplasm.  Associated RIGHT hilar and mediastinal adenopathy.  Small to moderate pericardial effusion.  Small RIGHT pleural effusion.  3 mm RIGHT upper lobe nodule.  Osseous metastases as above.  RIGHT middle lobe airspace infiltrate.   Electronically Signed   By: MLavonia DanaM.D.   On: 01/05/2015 09:30   Nm Pet Image Initial (pi) Skull Base To Thigh  01/10/2015   CLINICAL DATA:  Initial Treatment strategy for Lung cancer.  EXAM: NUCLEAR MEDICINE PET SKULL BASE TO THIGH  TECHNIQUE: 7.75 mCi F-18 FDG was injected intravenously. Full-ring PET imaging was performed  from the skull base to thigh after the radiotracer. CT data was obtained and used for attenuation correction and anatomic localization.  FASTING BLOOD GLUCOSE:  Value: 89 mg/dl  COMPARISON:  None.  FINDINGS: NECK  No hypermetabolic lymph nodes in the neck.  CHEST  Hypermetabolic mass within the right upper lobe measures 4 x 3.7 cm and has an SUV max equal to 14.5. There is a small to moderate right pleural effusion. There is hypermetabolic right hilar, bilateral mediastinal, and left supraclavicular adenopathy. The right hilar lymph node mass measures approximately 3.8 cm and has an SUV max equal to 15.7. Hypermetabolic left paratracheal lymph node measures 1 cm and has an SUV max equal to 7.07. Left supraclavicular lymph node measures 9 mm and has an SUV max equal to 11.9.  ABDOMEN/PELVIS  No abnormal hypermetabolic activity within the liver, pancreas, adrenal glands, or spleen. No hypermetabolic lymph nodes in the abdomen or pelvis.  SKELETON  Extensive multi focal hypermetabolic bone metastases identified. Index lytic lesion involving the left first rib measures approximate 1.9 cm and has an SUV max equal to 8.7. Large destructive lesion involving the T9 vertebra measures 2.5 cm and  has an SUV max equal to 14.5. Destructive bone lesion involving the L3 vertebra measures 3.5 cm and has an SUV max equal to 14.7. Hypermetabolic lytic lesions are noted involving the posterior column of the left acetabulum as well as the left inferior pubic rami.  IMPRESSION: 1. Right upper lobe lung mass is intensely hypermetabolic compatible with primary lung neoplasm. 2. Hypermetabolic ipsilateral mediastinal hypermetabolic adenopathy as well as bilateral mediastinal adenopathy and left supraclavicular adenopathy. 3. Multi focal lytic bone metastasis are identified involving the ribs, spine and bony pelvis.   Electronically Signed   By: Kerby Moors M.D.   On: 01/10/2015 09:47   Ct Biopsy  01/24/2015   CLINICAL DATA:  METASTATIC LUNG CANCER, ADDITIONAL BIOPSY REQUESTED for genetic/molecular testing  EXAM: CT GUIDED CORE BIOPSY OF RIGHT UPPER LOBE MASS  ANESTHESIA/SEDATION: 1.0  Mg IV Versed; 50 mcg IV Fentanyl  Total Moderate Sedation Time: 20 minutes.  PROCEDURE: The procedure risks, benefits, and alternatives were explained to the patient. Questions regarding the procedure were encouraged and answered. The patient understands and consents to the procedure.  The POSTERIOR RIGHT UPPER BACK was prepped with CHLORAPREPin a sterile fashion, and a sterile drape was applied covering the operative field. A sterile gown and sterile gloves were used for the procedure. Local anesthesia was provided with 1% Lidocaine.  Previous imaging reviewed. Patient positioned right side down decubitus. Noncontrast localization CT performed. The right upper lobe mass was localized. Under sterile conditions and local anesthesia, a 17 gauge 6.8 cm access needle was advanced percutaneously from a posterior oblique approach into the lesion. Needle position confirmed with CT. 3 18 gauge core biopsies obtained. Samples placed in formalin. Needle tract embolized with a [employer] plug.  Complications: None immediate  FINDINGS: CT imaging  confirms needle placement in the right upper lobe mass for core biopsy  IMPRESSION: Successful CT-guided right upper lobe mass 18 gauge core biopsy   Electronically Signed   By: Jerilynn Mages.  Shick M.D.   On: 01/24/2015 10:19   Ct Biopsy  01/12/2015   INDICATION: Concern for metastatic lung cancer. Please perform CT-guided biopsy of dominant lytic lesion involving the right-sided the L3 vertebral body for tissue diagnostic purposes  EXAM: CT-GUIDED BIOPSY OF L3 LYTIC LESION  COMPARISON:  PET-CT - 01/10/2015; CT of the chest, abdomen pelvis -  01/05/2015  MEDICATIONS: None  ANESTHESIA/SEDATION: Fentanyl 75 mcg IV; Versed 1.5 mg IV  Sedation time  15 minutes  CONTRAST:  None  COMPLICATIONS: None immediate.  PROCEDURE: Informed consent was obtained from the patient following an explanation of the procedure, risks, benefits and alternatives. The patient understands, agrees and consents for the procedure. All questions were addressed. A time out was performed prior to the initiation of the procedure.  The patient was positioned prone on the CT table and a limited CT was performed for procedural planning demonstrating unchanged appearance of dominant approximately 3.3 x 2.8 cm lytic mass involving primarily the right-sided the L3 vertebral body (image 17, series 2). The procedure was planned. The operative site was prepped and draped in the usual sterile fashion. Appropriate trajectory was confirmed with a 22 gauge spinal needle after the adjacent tissues were anesthetized with 1% Lidocaine with epinephrine.  A 22 gauge spinal needle was utilized for procedural planning. Next, an 11 gauge coaxial bone biopsy needle was advanced through the right L3 pedicle. Needle position was confirmed with CT imaging. Next, 2 bone lesion biopsies were obtained with the inner 13 gauge coaxial bone biopsy needle. Appropriate positioning was confirmed with CT imaging and finally a completion bone lesion biopsy was obtained with the 11 gauge outer  bone marrow device.  The needle was removed intact. Hemostasis was obtained with compression and a dressing was placed. The patient tolerated the procedure well without immediate post procedural complication.  IMPRESSION: Successful CT guided biopsy of L3 lytic lesion via a right L3 transpedicular approach.   Electronically Signed   By: Sandi Mariscal M.D.   On: 01/12/2015 15:24    ASSESSMENT AND PLAN: This is a very pleasant 64 years old never smoker white female recently diagnosed with stage IV non-small cell lung cancer, adenocarcinoma with positive EGFR mutation with deletion in exon 19 presented with large right upper lobe lung mass in addition to bilateral mediastinal, supraclavicular lymphadenopathy as well as bone and multiple brain metastasis diagnosed in June 2016.  The patient is currently undergoing whole brain irradiation under the care of Dr. Lisbeth Renshaw. I had a lengthy discussion with the patient and her family today about her current disease status and treatment options. I recommended for the patient treatment with EGFR tyrosine kinase inhibitor with Gilotrif which showed better survival benefit in patient with EGFR positive mutation with exon 19 deletion. I discussed with the patient adverse effect of this treatment including but not limited to skin rash, diarrhea, interstitial pneumonitis, liver, cardiac, or renal dysfunction. She will be started on Gilotrif 40 mg by mouth daily but this dose can be adjusted if the patient has significant adverse effects. She was advised to keep Imodium available was her for treatment of diarrhea and we will consider the patient for treatment with clindamycin lotion in addition to other measures if she developed significant skin rash. For the metastatic bone disease, I would consider the patient for treatment with Xgeva once I receive dental clearance from her dentist. She was also advised to take calcium and vitamin D supplements. She was also advised to taper  the Decadron dose gradually and she was given a taper plan. The patient would come back for follow-up visit in 2 weeks for reevaluation and management of any adverse effect of her treatment. She was advised to call immediately if she has any concerning symptoms in the interval. The patient voices understanding of current disease status and treatment options and is in agreement with  the current care plan.  All questions were answered. The patient knows to call the clinic with any problems, questions or concerns. We can certainly see the patient much sooner if necessary.  I spent 20 minutes counseling the patient face to face. The total time spent in the appointment was 30 minutes.  Disclaimer: This note was dictated with voice recognition software. Similar sounding words can inadvertently be transcribed and may not be corrected upon review.

## 2015-02-03 NOTE — Progress Notes (Signed)
Oncology Nurse Navigator Documentation  Oncology Nurse Navigator Flowsheets 02/03/2015  Navigator Encounter Type Treatment  Patient Visit Type Follow-up  Treatment Phase Treatment  Barriers/Navigation Needs Education/Spoke with Laura Walter and her friends and partner today.  I gave and explained her information on oral biologic medication.  I also gave her information on Xgeva.    Education Other  Support Groups/Services Friends and Family  Time Spent with Patient 23

## 2015-02-03 NOTE — Telephone Encounter (Signed)
Per pof cx todays appts...done..per orders pof pt aware

## 2015-02-03 NOTE — Telephone Encounter (Signed)
Added appt per pof...per staff..the patient aware

## 2015-02-03 NOTE — Progress Notes (Signed)
Weight and vitals are stable. Reports patient in spine and left hip are much less since starting radiation therapy. Rates overall pain 2 on a scale of 0-10. Reports less otc advil is required to manage pain now. Denies headache or dizziness. Reports nausea and vomiting are well managed with Zofran. Reports fatigue. Denies vision changes or tinnitus.  BP 127/88 mmHg  Pulse 78  Resp 16  Wt 138 lb 8 oz (62.823 kg) Wt Readings from Last 3 Encounters:  02/03/15 138 lb 8 oz (62.823 kg)  02/03/15 141 lb 4.8 oz (64.093 kg)  01/27/15 145 lb 6.4 oz (65.953 kg)

## 2015-02-07 ENCOUNTER — Other Ambulatory Visit: Payer: Self-pay | Admitting: Radiation Oncology

## 2015-02-07 ENCOUNTER — Encounter: Payer: Self-pay | Admitting: Internal Medicine

## 2015-02-07 ENCOUNTER — Ambulatory Visit
Admission: RE | Admit: 2015-02-07 | Discharge: 2015-02-07 | Disposition: A | Discharge status: Home / Self Care | Payer: BC Managed Care – PPO | Source: Ambulatory Visit | Attending: Radiation Oncology | Admitting: Radiation Oncology

## 2015-02-07 ENCOUNTER — Telehealth: Payer: Self-pay | Admitting: *Deleted

## 2015-02-07 DIAGNOSIS — C7931 Secondary malignant neoplasm of brain: Secondary | ICD-10-CM | POA: Diagnosis not present

## 2015-02-07 NOTE — Telephone Encounter (Signed)
Oncology Nurse Navigator Documentation  Oncology Nurse Navigator Flowsheets 02/07/2015  Navigator Encounter Type Telephone/I called to check to see if patient has received her oral biologic.  I left a vm message to call if needed.  I did see that her insurance has approved medication.    Patient Visit Type Follow-up  Education Understanding Cancer/ Treatment Options  Coordination of Care Other  Time Spent with Patient 15

## 2015-02-07 NOTE — Progress Notes (Signed)
I faxed prior auth req to [facility] for gilotrif '40mg'$  tablet and approved  WPVXYI:[id number];MOLMBEM Name:ST: Gilotrif - *[facility]*;Status:Approved;Coverage Start Date:01/08/2015;Coverage End Date:02/06/2018;

## 2015-02-08 ENCOUNTER — Encounter: Payer: Self-pay | Admitting: Radiation Oncology

## 2015-02-08 ENCOUNTER — Ambulatory Visit
Admission: RE | Admit: 2015-02-08 | Discharge: 2015-02-08 | Disposition: A | Discharge status: Home / Self Care | Payer: BC Managed Care – PPO | Source: Ambulatory Visit | Attending: Radiation Oncology | Admitting: Radiation Oncology

## 2015-02-08 ENCOUNTER — Inpatient Hospital Stay
Admission: RE | Admit: 2015-02-08 | Discharge: 2015-02-08 | Disposition: A | Discharge status: Home / Self Care | Payer: Self-pay | Source: Ambulatory Visit | Attending: Radiation Oncology | Admitting: Radiation Oncology

## 2015-02-08 DIAGNOSIS — C7931 Secondary malignant neoplasm of brain: Secondary | ICD-10-CM | POA: Diagnosis not present

## 2015-02-08 NOTE — Progress Notes (Signed)
EOT 10/10 ,pt c/o cough again since last Friday night and slight dificulty swallowing foods, checked tongue/mouth no thrush seen, listened to all 4 fields of her lungs all clear,no fever vitals wnl, encouraged drinking plenty water, asked if in pain, no pain a lot better, gave 1 month f/u not needed to be seen by MD saw her in same block on 7./1/16, will call if fever occurs 100.5or greater patient in great spirits 12:47 PM\

## 2015-02-09 ENCOUNTER — Other Ambulatory Visit: Payer: BC Managed Care – PPO

## 2015-02-09 ENCOUNTER — Ambulatory Visit: Payer: BC Managed Care – PPO

## 2015-02-09 ENCOUNTER — Ambulatory Visit: Payer: BC Managed Care – PPO | Admitting: Internal Medicine

## 2015-02-09 ENCOUNTER — Telehealth: Payer: Self-pay | Admitting: *Deleted

## 2015-02-09 NOTE — Telephone Encounter (Signed)
Incoming fax from [facility] pt Laura Walter '40mg'$  -Prior Auth approved 7/6, rx ready for pt pick up

## 2015-02-10 ENCOUNTER — Ambulatory Visit: Payer: BC Managed Care – PPO

## 2015-02-11 ENCOUNTER — Inpatient Hospital Stay (HOSPITAL_COMMUNITY)
Admission: EM | Admit: 2015-02-11 | Discharge: 2015-02-15 | DRG: 809 | Disposition: A | Discharge status: Home / Self Care | Payer: BC Managed Care – PPO | Attending: Internal Medicine | Admitting: Internal Medicine

## 2015-02-11 ENCOUNTER — Encounter (HOSPITAL_COMMUNITY): Payer: Self-pay | Admitting: *Deleted

## 2015-02-11 ENCOUNTER — Emergency Department (HOSPITAL_COMMUNITY): Payer: BC Managed Care – PPO

## 2015-02-11 DIAGNOSIS — C7951 Secondary malignant neoplasm of bone: Secondary | ICD-10-CM | POA: Diagnosis present

## 2015-02-11 DIAGNOSIS — D61818 Other pancytopenia: Secondary | ICD-10-CM | POA: Diagnosis not present

## 2015-02-11 DIAGNOSIS — Z923 Personal history of irradiation: Secondary | ICD-10-CM | POA: Diagnosis not present

## 2015-02-11 DIAGNOSIS — Z6822 Body mass index (BMI) 22.0-22.9, adult: Secondary | ICD-10-CM

## 2015-02-11 DIAGNOSIS — Z79899 Other long term (current) drug therapy: Secondary | ICD-10-CM

## 2015-02-11 DIAGNOSIS — D709 Neutropenia, unspecified: Secondary | ICD-10-CM | POA: Diagnosis present

## 2015-02-11 DIAGNOSIS — R509 Fever, unspecified: Secondary | ICD-10-CM | POA: Diagnosis present

## 2015-02-11 DIAGNOSIS — Z791 Long term (current) use of non-steroidal anti-inflammatories (NSAID): Secondary | ICD-10-CM

## 2015-02-11 DIAGNOSIS — E46 Unspecified protein-calorie malnutrition: Secondary | ICD-10-CM | POA: Diagnosis present

## 2015-02-11 DIAGNOSIS — Z8701 Personal history of pneumonia (recurrent): Secondary | ICD-10-CM

## 2015-02-11 DIAGNOSIS — E441 Mild protein-calorie malnutrition: Secondary | ICD-10-CM | POA: Diagnosis present

## 2015-02-11 DIAGNOSIS — Z882 Allergy status to sulfonamides status: Secondary | ICD-10-CM | POA: Diagnosis not present

## 2015-02-11 DIAGNOSIS — Z8249 Family history of ischemic heart disease and other diseases of the circulatory system: Secondary | ICD-10-CM

## 2015-02-11 DIAGNOSIS — Z79891 Long term (current) use of opiate analgesic: Secondary | ICD-10-CM

## 2015-02-11 DIAGNOSIS — T451X5A Adverse effect of antineoplastic and immunosuppressive drugs, initial encounter: Secondary | ICD-10-CM | POA: Diagnosis present

## 2015-02-11 DIAGNOSIS — D61810 Antineoplastic chemotherapy induced pancytopenia: Principal | ICD-10-CM | POA: Diagnosis present

## 2015-02-11 DIAGNOSIS — D6959 Other secondary thrombocytopenia: Secondary | ICD-10-CM | POA: Diagnosis present

## 2015-02-11 DIAGNOSIS — R5081 Fever presenting with conditions classified elsewhere: Secondary | ICD-10-CM | POA: Diagnosis present

## 2015-02-11 DIAGNOSIS — C3411 Malignant neoplasm of upper lobe, right bronchus or lung: Secondary | ICD-10-CM | POA: Diagnosis present

## 2015-02-11 DIAGNOSIS — Z7952 Long term (current) use of systemic steroids: Secondary | ICD-10-CM

## 2015-02-11 DIAGNOSIS — R197 Diarrhea, unspecified: Secondary | ICD-10-CM | POA: Diagnosis present

## 2015-02-11 DIAGNOSIS — E876 Hypokalemia: Secondary | ICD-10-CM | POA: Diagnosis present

## 2015-02-11 DIAGNOSIS — Z800 Family history of malignant neoplasm of digestive organs: Secondary | ICD-10-CM | POA: Diagnosis not present

## 2015-02-11 DIAGNOSIS — Z833 Family history of diabetes mellitus: Secondary | ICD-10-CM

## 2015-02-11 DIAGNOSIS — D7589 Other specified diseases of blood and blood-forming organs: Secondary | ICD-10-CM | POA: Diagnosis not present

## 2015-02-11 DIAGNOSIS — C7931 Secondary malignant neoplasm of brain: Secondary | ICD-10-CM | POA: Diagnosis present

## 2015-02-11 DIAGNOSIS — C3491 Malignant neoplasm of unspecified part of right bronchus or lung: Secondary | ICD-10-CM | POA: Diagnosis present

## 2015-02-11 DIAGNOSIS — D6481 Anemia due to antineoplastic chemotherapy: Secondary | ICD-10-CM | POA: Diagnosis present

## 2015-02-11 LAB — COMPREHENSIVE METABOLIC PANEL
ALK PHOS: 56 U/L (ref 38–126)
ALT: 21 U/L (ref 14–54)
AST: 22 U/L (ref 15–41)
Albumin: 3 g/dL — ABNORMAL LOW (ref 3.5–5.0)
Anion gap: 10 (ref 5–15)
BILIRUBIN TOTAL: 1.3 mg/dL — AB (ref 0.3–1.2)
BUN: 31 mg/dL — AB (ref 6–20)
CHLORIDE: 100 mmol/L — AB (ref 101–111)
CO2: 23 mmol/L (ref 22–32)
Calcium: 7.8 mg/dL — ABNORMAL LOW (ref 8.9–10.3)
Creatinine, Ser: 0.73 mg/dL (ref 0.44–1.00)
GFR calc Af Amer: >60 mL/min (ref >60)
GFR calc non Af Amer: >60 mL/min (ref >60)
Glucose, Bld: 141 mg/dL — ABNORMAL HIGH (ref 65–99)
POTASSIUM: 3.4 mmol/L — AB (ref 3.5–5.1)
SODIUM: 133 mmol/L — AB (ref 135–145)
TOTAL PROTEIN: 5.9 g/dL — AB (ref 6.5–8.1)

## 2015-02-11 LAB — CBC WITH DIFFERENTIAL/PLATELET
BASOS PCT: 1 % (ref 0–1)
Basophils Absolute: 0 10*3/uL (ref 0.0–0.1)
Eosinophils Absolute: 0.1 10*3/uL (ref 0.0–0.7)
Eosinophils Relative: 5 % (ref 0–5)
HEMATOCRIT: 40 % (ref 36.0–46.0)
HEMOGLOBIN: 13.4 g/dL (ref 12.0–15.0)
Lymphocytes Relative: 5 % — ABNORMAL LOW (ref 12–46)
Lymphs Abs: 0.1 10*3/uL — ABNORMAL LOW (ref 0.7–4.0)
MCH: 29.8 pg (ref 26.0–34.0)
MCHC: 33.5 g/dL (ref 30.0–36.0)
MCV: 89.1 fL (ref 78.0–100.0)
MONO ABS: 0.1 10*3/uL (ref 0.1–1.0)
Monocytes Relative: 8 % (ref 3–12)
NEUTROS ABS: 1.3 10*3/uL — AB (ref 1.7–7.7)
Neutrophils Relative %: 81 % — ABNORMAL HIGH (ref 43–77)
Platelets: 38 10*3/uL — ABNORMAL LOW (ref 150–400)
RBC: 4.49 MIL/uL (ref 3.87–5.11)
RDW: 13.8 % (ref 11.5–15.5)
WBC: 1.6 10*3/uL — ABNORMAL LOW (ref 4.0–10.5)

## 2015-02-11 LAB — URINALYSIS, ROUTINE W REFLEX MICROSCOPIC
BILIRUBIN URINE: NEGATIVE
Glucose, UA: NEGATIVE mg/dL
Ketones, ur: NEGATIVE mg/dL
Leukocytes, UA: NEGATIVE
NITRITE: NEGATIVE
PH: 5.5 (ref 5.0–8.0)
Protein, ur: 30 mg/dL — AB
SPECIFIC GRAVITY, URINE: 1.03 (ref 1.005–1.030)
Urobilinogen, UA: 1 mg/dL (ref 0.0–1.0)

## 2015-02-11 LAB — I-STAT CG4 LACTIC ACID, ED
LACTIC ACID, VENOUS: 0.61 mmol/L (ref 0.5–2.0)
Lactic Acid, Venous: 1.9 mmol/L (ref 0.5–2.0)

## 2015-02-11 LAB — URINE MICROSCOPIC-ADD ON

## 2015-02-11 MED ORDER — ONDANSETRON HCL 4 MG PO TABS
4.0000 mg | ORAL_TABLET | Freq: Four times a day (QID) | ORAL | Status: DC | PRN
  Administered 2015-02-11: 4 mg via ORAL
  Filled 2015-02-11: qty 1

## 2015-02-11 MED ORDER — PIPERACILLIN-TAZOBACTAM 3.375 G IVPB
3.3750 g | Freq: Three times a day (TID) | INTRAVENOUS | Status: DC
  Administered 2015-02-11 – 2015-02-12 (×3): 3.375 g via INTRAVENOUS
  Filled 2015-02-11 (×4): qty 50

## 2015-02-11 MED ORDER — SODIUM CHLORIDE 0.9 % IJ SOLN
3.0000 mL | Freq: Two times a day (BID) | INTRAMUSCULAR | Status: DC
  Administered 2015-02-15: 3 mL via INTRAVENOUS

## 2015-02-11 MED ORDER — SODIUM CHLORIDE 0.9 % IV BOLUS (SEPSIS)
1000.0000 mL | Freq: Once | INTRAVENOUS | Status: AC
  Administered 2015-02-11: 1000 mL via INTRAVENOUS

## 2015-02-11 MED ORDER — HYDROCODONE-HOMATROPINE 5-1.5 MG/5ML PO SYRP
5.0000 mL | ORAL_SOLUTION | Freq: Four times a day (QID) | ORAL | Status: DC | PRN
  Administered 2015-02-11 – 2015-02-14 (×13): 5 mL via ORAL
  Filled 2015-02-11 (×13): qty 5

## 2015-02-11 MED ORDER — OXYCODONE-ACETAMINOPHEN 5-325 MG PO TABS: 1.0000 | ORAL_TABLET | Freq: Four times a day (QID) | ORAL | Status: DC | PRN

## 2015-02-11 MED ORDER — SODIUM CHLORIDE 0.9 % IV SOLN
INTRAVENOUS | Status: DC
  Administered 2015-02-11 – 2015-02-12 (×4): via INTRAVENOUS

## 2015-02-11 MED ORDER — VANCOMYCIN HCL IN DEXTROSE 750-5 MG/150ML-% IV SOLN
750.0000 mg | Freq: Two times a day (BID) | INTRAVENOUS | Status: DC
  Administered 2015-02-11 – 2015-02-12 (×2): 750 mg via INTRAVENOUS
  Filled 2015-02-11 (×3): qty 150

## 2015-02-11 MED ORDER — ACETAMINOPHEN 325 MG PO TABS
650.0000 mg | ORAL_TABLET | Freq: Four times a day (QID) | ORAL | Status: DC | PRN
  Administered 2015-02-11 – 2015-02-14 (×10): 650 mg via ORAL
  Filled 2015-02-11 (×10): qty 2

## 2015-02-11 MED ORDER — PIPERACILLIN-TAZOBACTAM 3.375 G IVPB 30 MIN
3.3750 g | Freq: Once | INTRAVENOUS | Status: AC
  Administered 2015-02-11: 3.375 g via INTRAVENOUS
  Filled 2015-02-11: qty 50

## 2015-02-11 MED ORDER — ACETAMINOPHEN 325 MG PO TABS
650.0000 mg | ORAL_TABLET | Freq: Once | ORAL | Status: AC
  Administered 2015-02-11: 650 mg via ORAL
  Filled 2015-02-11: qty 2

## 2015-02-11 MED ORDER — VANCOMYCIN HCL IN DEXTROSE 1-5 GM/200ML-% IV SOLN
1000.0000 mg | Freq: Once | INTRAVENOUS | Status: AC
  Administered 2015-02-11: 1000 mg via INTRAVENOUS
  Filled 2015-02-11: qty 200

## 2015-02-11 MED ORDER — ACETAMINOPHEN 650 MG RE SUPP: 650.0000 mg | Freq: Four times a day (QID) | RECTAL | Status: DC | PRN

## 2015-02-11 MED ORDER — ONDANSETRON HCL 4 MG/2ML IJ SOLN
4.0000 mg | Freq: Four times a day (QID) | INTRAMUSCULAR | Status: DC | PRN
  Administered 2015-02-11 – 2015-02-15 (×16): 4 mg via INTRAVENOUS
  Filled 2015-02-11 (×16): qty 2

## 2015-02-11 MED ORDER — ENSURE ENLIVE PO LIQD
237.0000 mL | Freq: Two times a day (BID) | ORAL | Status: DC
  Administered 2015-02-13 – 2015-02-14 (×3): 237 mL via ORAL

## 2015-02-11 MED ORDER — DEXAMETHASONE 4 MG PO TABS
4.0000 mg | ORAL_TABLET | Freq: Two times a day (BID) | ORAL | Status: DC
  Administered 2015-02-11: 4 mg via ORAL
  Administered 2015-02-11: 2 mg via ORAL
  Administered 2015-02-12 – 2015-02-13 (×3): 4 mg via ORAL
  Administered 2015-02-13: 2 mg via ORAL
  Administered 2015-02-14 – 2015-02-15 (×3): 4 mg via ORAL
  Filled 2015-02-11 (×10): qty 1

## 2015-02-11 NOTE — ED Notes (Signed)
Pt states that she has Radiation on Wed; pt reports that she has felt bad all day; pt reports that she was running of fever of 101 at home prior to arrival

## 2015-02-11 NOTE — H&P (Signed)
[facility] History and Physical  Patient: Laura Walter  MRN: [id number]  DOB: [birth year]  DOS: the patient was seen and examined on 02/11/2015 PCP: [name], MD  Referring physician: Dr. Sharol Given Chief Complaint: Fatigue and cough  HPI: Laura Walter is a 64 y.o. female with Past medical history of metastatic non-small cell lung cancer ER positive on chemotherapy, radiation, brain metastasis. The patient presented with complaints of progressively worsening cough as well as fatigue and fever. This is on ongoing since last 2 days. She also complains of 4-5 bowel movements on a daily basis without any active blood. She started these symptoms along with her chemotherapy which is known for diarrhea. She denies any recent antibiotics use. She continues to take Decadron. She denies any abdominal pain. She complains that her cough is increasing in her sputum production is also increasing. She compresses of fever and chills. No focal deficit. No burning urination.  The patient is coming from home.  At her baseline ambulates without any support And is independent for most of her ADL manages her medication on her own.  Review of Systems: as mentioned in the history of present illness.  A comprehensive review of the other systems is negative.  Past Medical History  Diagnosis Date  . Pneumonia 2009 & 2011  . Allergy     seasonal  . H/O reactive hypoglycemia   . Osteopenia   . IBS (irritable bowel syndrome)   . Brain cancer 01/13/15    MRI multiple small brain mets 10 individual lesions  . Bone cancer 01/10/15 PET    lytic lesion lt 1st rib,T9,L3 Lt acetabulum  . Bronchitis    Past Surgical History  Procedure Laterality Date  . Colonoscopy  2009    Negative; [facility]  . Tonsillectomy and adenoidectomy    . Wisdom tooth extraction     Social History:  reports that she has never smoked. She has never used smokeless tobacco. She reports that she drinks alcohol. She reports that  she does not use illicit drugs.  Allergies  Allergen Reactions  . Sulfonamide Derivatives Rash    Childhood reaction    Family History  Problem Relation Age of Onset  . Transient ischemic attack Mother   . Mental illness Mother     Dementia  . Diabetes Father   . Heart attack Father 65  . Stomach cancer Maternal Aunt   . Heart attack Maternal Aunt     in 47s  . Diabetes Paternal Uncle     X 3  . Heart attack Maternal Grandfather     in 15s    Prior to Admission medications   Medication Sig Start Date End Date Taking? Authorizing Provider  afatinib dimaleate (GILOTRIF) 40 MG tablet Take 1 tablet (40 mg total) by mouth daily. Take on an empty stomach 1hr before or 2 hrs after meals. 02/03/15   Curt Bears, MD  dexamethasone (DECADRON) 4 MG tablet TAKE 1 TABLET (4 MG TOTAL) BY MOUTH 2 (TWO) TIMES DAILY. 02/02/15   Curt Bears, MD  dexamethasone (DECADRON) 4 MG tablet Take 1 tablet (4 mg total) by mouth 2 (two) times daily. 02/02/15   Curt Bears, MD  Dextromethorphan HBr 15 MG/5ML LIQD Take 5 mLs by mouth every 5 (five) hours as needed (cough).    Historical Provider, MD  emollient (BIAFINE) cream Apply 1 application topically daily. 01/30/15   Kyung Rudd, MD  HYDROcodone-homatropine [facility]) 5-1.5 MG/5ML syrup Take 5 mLs by mouth every 6 (  six) hours as needed for cough. 01/13/15   Curt Bears, MD  ibuprofen (ADVIL,MOTRIN) 200 MG tablet Take 200 mg by mouth every 6 (six) hours as needed for moderate pain.    Historical Provider, MD  Misc. Devices (CANE) MISC by Does not apply route.    Historical Provider, MD  ondansetron (ZOFRAN) 4 MG tablet TAKE 1 TABLET BY MOUTH 3 TIMES A DAY AS NEEDED FOR NAUSEA/VOMITING 02/07/15   Kyung Rudd, MD  oxyCODONE-acetaminophen (PERCOCET/ROXICET) 5-325 MG per tablet Take 1 tablet by mouth every 6 (six) hours as needed for severe pain. Patient not taking: Reported on 02/08/2015 01/03/15   Curt Bears, MD  scopolamine (TRANSDERM-SCOP) 1.5 MG  Place 1 patch (1.5 mg total) onto the skin every 3 (three) days. Patient not taking: Reported on 02/08/2015 10/08/13   Hendricks Limes, MD  UNABLE TO FIND 1 each by Does not apply route as needed (Cane :    Per medical necessity use with ambulation). 02/03/15   Curt Bears, MD    Physical Exam: Filed Vitals:   02/11/15 0224 02/11/15 0333 02/11/15 0545  BP: 125/73  109/60  Pulse: 120  101  Temp: 97.6 F (36.4 C) 102.9 F (39.4 C) 98.9 F (37.2 C)  TempSrc: Oral Rectal   Resp: 20  20  Height:   '5\' 7"'$  (1.702 m)  Weight:   63.005 kg (138 lb 14.4 oz)  SpO2: 99%  99%    General: Alert, Awake and Oriented to Time, Place and Person. Appear in mild distress Eyes: PERRL ENT: Oral Mucosa clear moist. Neck: nop JVD Cardiovascular: S1 and S2 Present, no Murmur, Peripheral Pulses Present Respiratory: Bilateral Air entry equal and Decreased,  Bilateral Crackles, no wheezes Abdomen: Bowel Sound present, Soft and non tender Skin: no Rash Extremities: no Pedal edema, no calf tenderness Neurologic: Grossly no focal neuro deficit.  Labs on Admission:  CBC:  Recent Labs Lab 02/11/15 0308  WBC 1.6*  NEUTROABS 1.3*  HGB 13.4  HCT 40.0  MCV 89.1  PLT 38*    CMP     Component Value Date/Time   NA 133* 02/11/2015 0308   NA 143 01/03/2015 1342   K 3.4* 02/11/2015 0308   K 3.6 01/03/2015 1342   CL 100* 02/11/2015 0308   CO2 23 02/11/2015 0308   CO2 25 01/03/2015 1342   GLUCOSE 141* 02/11/2015 0308   GLUCOSE 115 01/03/2015 1342   BUN 31* 02/11/2015 0308   BUN 24.7 01/03/2015 1342   CREATININE 0.73 02/11/2015 0308   CREATININE 1.0 01/03/2015 1342   CREATININE 0.83 11/02/2013 0832   CALCIUM 7.8* 02/11/2015 0308   CALCIUM 9.3 01/03/2015 1342   PROT 5.9* 02/11/2015 0308   PROT 6.7 01/03/2015 1342   ALBUMIN 3.0* 02/11/2015 0308   ALBUMIN 3.7 01/03/2015 1342   AST 22 02/11/2015 0308   AST 15 01/03/2015 1342   ALT 21 02/11/2015 0308   ALT 10 01/03/2015 1342   ALKPHOS 56  02/11/2015 0308   ALKPHOS 90 01/03/2015 1342   BILITOT 1.3* 02/11/2015 0308   BILITOT 0.44 01/03/2015 1342   GFRNONAA >60 02/11/2015 0308   GFRAA >60 02/11/2015 0308    No results for input(s): LIPASE, AMYLASE in the last 168 hours.  No results for input(s): CKTOTAL, CKMB, CKMBINDEX, TROPONINI in the last 168 hours. BNP (last 3 results) No results for input(s): BNP in the last 8760 hours.  ProBNP (last 3 results) No results for input(s): PROBNP in the last 8760 hours.  Radiological Exams on Admission: Dg Chest 2 View  02/11/2015   CLINICAL DATA:  Febrile, cough and shortness of breath. History of metastatic RIGHT lung cancer. Status post radiation 3 days ago.  EXAM: CHEST  2 VIEW  COMPARISON:  Chest radiograph January 24, 2015 and CT chest January 05, 2015  FINDINGS: RIGHT upper lobe mass, similar to prior chest radiograph. No pleural effusion. Cardiomediastinal silhouette is unremarkable. No pneumothorax. Patient is mildly osteopenic. Mild approximate T9 pathologic fracture better characterized on prior CT.  IMPRESSION: No active cardiopulmonary disease.  RIGHT upper lobe mass correspond to patient's known primary lung neoplasm.   Electronically Signed   By: Elon Alas M.D.   On: 02/11/2015 04:05   Assessment/Plan Principal Problem:   Neutropenia with fever Active Problems:   Bone metastases   Primary cancer of right lung   Bicytopenia   Diarrhea   1. Neutropenia with fever The patient is presenting with complaints of fever and fatigue. She is found to be neutropenic with absolute count less than 1500. With this she will be admitted in the hospital. I would give her vancomycin and Zosyn as her peripheral smear shows left shift. Follow cultures. Holding chemotherapy regimens at present.  2. Bicytopenia. Diarrhea. The patient has just started chemotherapy and now has developed bicytopenia with low WBC as well as platelets. Monitor closely. Also she has developed  diarrhea. Which is a common side effect of this chemotherapy regimen. I would hydrate her with IV fluids and rule out C. difficile.  3. Malnutrition. Continuing feeding supplements at home regimen.  Advance goals of care discussion: Full code   DVT Prophylaxis:mechanical compression device  Nutrition: Regular diet  Family Communication: family was present at bedside, opportunity was given to ask question and all questions were answered satisfactorily at the time of interview. Disposition: Admitted as inpatient, telemetry unit.  Author: Berle Mull, MD [facility] Pager: [phone] 02/11/2015  If 7PM-7AM, please contact night-coverage [email or web address] Password [email or web address]

## 2015-02-11 NOTE — ED Provider Notes (Signed)
CSN: [id number]     Arrival date & time 02/11/15  0218 History   First MD Initiated Contact with Patient 02/11/15 0236     Chief Complaint  Patient presents with  . Fever     (Consider location/radiation/quality/duration/timing/severity/associated sxs/prior Treatment) HPI Comments: Patient is a 64 y/o female with a hx of stage IV (T2a, N3, M1b) non-small lung cancer with multiple bone and brain lesions diagnosed in June 2016. She presents to the emergency department this evening for complaints of fever. Patient reports a fever of 101F prior to arrival. Temperature was taken orally. Patient has a nonproductive cough at baseline which, her partner states, has worsened recently. Patient denies any new shortness of breath. She had one episode of emesis one hour ago. She has also been experiencing some watery diarrhea since starting her oral chemotherapy agent. She has had approximately 3-4 episodes of diarrhea today. Patient states that she finished radiation 2 days ago; she underwent 20 rounds of radiation in 10 days. Patient denies any dysuria, hematuria, abdominal pain, melena, hematochezia, hematemesis, or chest pain. She feels fatigued, but has some degree of this at her baseline. No medications taken PTA for fever.  Oncologist - Dr. Lisbeth Renshaw and Dr. Julien Nordmann. PCP - Dr. Birdie Riddle  Patient is a 64 y.o. female presenting with fever. The history is provided by the patient. No language interpreter was used.  Fever Associated symptoms: cough, diarrhea and vomiting     Past Medical History  Diagnosis Date  . Pneumonia 2009 & 2011  . Allergy     seasonal  . H/O reactive hypoglycemia   . Osteopenia   . IBS (irritable bowel syndrome)   . Brain cancer 01/13/15    MRI multiple small brain mets 10 individual lesions  . Bone cancer 01/10/15 PET    lytic lesion lt 1st rib,T9,L3 Lt acetabulum  . Bronchitis    Past Surgical History  Procedure Laterality Date  . Colonoscopy  2009    Negative; [facility]   . Tonsillectomy and adenoidectomy    . Wisdom tooth extraction     Family History  Problem Relation Age of Onset  . Transient ischemic attack Mother   . Mental illness Mother     Dementia  . Diabetes Father   . Heart attack Father 72  . Stomach cancer Maternal Aunt   . Heart attack Maternal Aunt     in 5s  . Diabetes Paternal Uncle     X 3  . Heart attack Maternal Grandfather     in 82s   History  Substance Use Topics  . Smoking status: Never Smoker   . Smokeless tobacco: Never Used  . Alcohol Use: Yes     Comment: occasionally rare wine   OB History    No data available      Review of Systems  Constitutional: Positive for fever.  Respiratory: Positive for cough.   Gastrointestinal: Positive for vomiting and diarrhea.  All other systems reviewed and are negative.   Allergies  Sulfonamide derivatives  Home Medications   Prior to Admission medications   Medication Sig Start Date End Date Taking? Authorizing Provider  afatinib dimaleate (GILOTRIF) 40 MG tablet Take 1 tablet (40 mg total) by mouth daily. Take on an empty stomach 1hr before or 2 hrs after meals. 02/03/15   Curt Bears, MD  dexamethasone (DECADRON) 4 MG tablet TAKE 1 TABLET (4 MG TOTAL) BY MOUTH 2 (TWO) TIMES DAILY. 02/02/15   Curt Bears, MD  dexamethasone (DECADRON)  4 MG tablet Take 1 tablet (4 mg total) by mouth 2 (two) times daily. 02/02/15   Curt Bears, MD  Dextromethorphan HBr 15 MG/5ML LIQD Take 5 mLs by mouth every 5 (five) hours as needed (cough).    Historical Provider, MD  emollient (BIAFINE) cream Apply 1 application topically daily. 01/30/15   Kyung Rudd, MD  HYDROcodone-homatropine [facility]) 5-1.5 MG/5ML syrup Take 5 mLs by mouth every 6 (six) hours as needed for cough. 01/13/15   Curt Bears, MD  ibuprofen (ADVIL,MOTRIN) 200 MG tablet Take 200 mg by mouth every 6 (six) hours as needed for moderate pain.    Historical Provider, MD  Misc. Devices (CANE) MISC by Does not apply  route.    Historical Provider, MD  ondansetron (ZOFRAN) 4 MG tablet TAKE 1 TABLET BY MOUTH 3 TIMES A DAY AS NEEDED FOR NAUSEA/VOMITING 02/07/15   Kyung Rudd, MD  oxyCODONE-acetaminophen (PERCOCET/ROXICET) 5-325 MG per tablet Take 1 tablet by mouth every 6 (six) hours as needed for severe pain. Patient not taking: Reported on 02/08/2015 01/03/15   Curt Bears, MD  scopolamine (TRANSDERM-SCOP) 1.5 MG Place 1 patch (1.5 mg total) onto the skin every 3 (three) days. Patient not taking: Reported on 02/08/2015 10/08/13   Hendricks Limes, MD  UNABLE TO FIND 1 each by Does not apply route as needed (Cane :    Per medical necessity use with ambulation). 02/03/15   Curt Bears, MD   BP 125/73 mmHg  Pulse 120  Temp(Src) 102.9 F (39.4 C) (Rectal)  Resp 20  SpO2 99%   Physical Exam  Constitutional: She is oriented to person, place, and time. She appears well-developed and well-nourished. No distress.  Chronically ill appearing  HENT:  Head: Normocephalic and atraumatic.  Eyes: Conjunctivae and EOM are normal. No scleral icterus.  Neck: Normal range of motion.  Cardiovascular: Regular rhythm and intact distal pulses.   Mild tachycardia.  Pulmonary/Chest: Effort normal. No respiratory distress. She has no rales.  No tachypnea or dyspnea. Chest expansion symmetric.  Musculoskeletal: Normal range of motion.  Neurological: She is alert and oriented to person, place, and time. She exhibits normal muscle tone. Coordination normal.  GCS 15. Patient moves extremities without ataxia.  Skin: Skin is warm and dry. No rash noted. She is not diaphoretic. No erythema.  Psychiatric: She has a normal mood and affect. Her behavior is normal.  Nursing note and vitals reviewed.   ED Course  Procedures (including critical care time) Labs Review Labs Reviewed  CBC WITH DIFFERENTIAL/PLATELET - Abnormal; Notable for the following:    WBC 1.6 (*)    Platelets 38 (*)    Neutrophils Relative % 81 (*)    Lymphocytes  Relative 5 (*)    Neutro Abs 1.3 (*)    Lymphs Abs 0.1 (*)    All other components within normal limits  COMPREHENSIVE METABOLIC PANEL - Abnormal; Notable for the following:    Sodium 133 (*)    Potassium 3.4 (*)    Chloride 100 (*)    Glucose, Bld 141 (*)    BUN 31 (*)    Calcium 7.8 (*)    Total Protein 5.9 (*)    Albumin 3.0 (*)    Total Bilirubin 1.3 (*)    All other components within normal limits  CULTURE, BLOOD (ROUTINE X 2)  CULTURE, BLOOD (ROUTINE X 2)  URINE CULTURE  URINALYSIS, ROUTINE W REFLEX MICROSCOPIC (NOT AT [facility])  I-STAT CG4 LACTIC ACID, ED    Imaging  Review Dg Chest 2 View  02/11/2015   CLINICAL DATA:  Febrile, cough and shortness of breath. History of metastatic RIGHT lung cancer. Status post radiation 3 days ago.  EXAM: CHEST  2 VIEW  COMPARISON:  Chest radiograph January 24, 2015 and CT chest January 05, 2015  FINDINGS: RIGHT upper lobe mass, similar to prior chest radiograph. No pleural effusion. Cardiomediastinal silhouette is unremarkable. No pneumothorax. Patient is mildly osteopenic. Mild approximate T9 pathologic fracture better characterized on prior CT.  IMPRESSION: No active cardiopulmonary disease.  RIGHT upper lobe mass correspond to patient's known primary lung neoplasm.   Electronically Signed   By: Elon Alas M.D.   On: 02/11/2015 04:05     EKG Interpretation None       Medications  sodium chloride 0.9 % bolus 1,000 mL (not administered)  vancomycin (VANCOCIN) IVPB 1000 mg/200 mL premix (not administered)  piperacillin-tazobactam (ZOSYN) IVPB 3.375 g (not administered)  piperacillin-tazobactam (ZOSYN) IVPB 3.375 g (not administered)  sodium chloride 0.9 % bolus 1,000 mL (1,000 mLs Intravenous New Bag/Given 02/11/15 0321)  acetaminophen (TYLENOL) tablet 650 mg (650 mg Oral Given 02/11/15 0400)    MDM   Final diagnoses:  Neutropenic fever    64 y/o female with a hx of stage IV (T2a, N3, M1b) non-small lung cancer with multiple bone and  brain lesions presents to the ED for fever. She is currently on an oral chemotherapy regimen. She completed 10 days of double radiation treatments on Wednesday. Fever 102.17F rectal; tx in ED with tylenol. Vancomycin and Zosyn initiated. Blood cultures pending. No evidence of focal consolidation on CXR to suggest PNA. Urine is currently pending.  Case discussed with Dr. Posey Pronto of [facility] to admit for neutropenic fever.   Filed Vitals:   02/11/15 0224 02/11/15 0333  BP: 125/73   Pulse: 120   Temp: 97.6 F (36.4 C) 102.9 F (39.4 C)  TempSrc: Oral Rectal  Resp: 20   SpO2: 99%      Antonietta Breach, PA-C 02/11/15 [place], MD 02/11/15 [phone]

## 2015-02-11 NOTE — Progress Notes (Signed)
Patient was seen and evaluated earlier this a.m. by my associate. Please refer to his H&P for details regarding assessment and plan. Patient reports the nausea is well controlled on Zofran.  We'll plan on reassessing next a.m. unless acute medical condition arises requiring reevaluation.  Gen.: Patient in no acute distress, alert and awake Cardiovascular: No cyanosis Pulmonary: No increased work of breathing, speaking in full sentences, no audible wheezes  Laura Walter, Laura Walter

## 2015-02-11 NOTE — Progress Notes (Addendum)
ANTIBIOTIC CONSULT NOTE - INITIAL  Pharmacy Consult for zosyn Indication:sepsis   Allergies  Allergen Reactions  . Sulfonamide Derivatives Rash    Childhood reaction    Patient Measurements:   Adjusted Body Weight:   Vital Signs: Temp: 102.9 F (39.4 C) (07/09 0333) Temp Source: Rectal (07/09 0333) BP: 125/73 mmHg (07/09 0224) Pulse Rate: 120 (07/09 0224) Intake/Output from previous day:   Intake/Output from this shift:    Labs:  Recent Labs  02/11/15 0308  WBC 1.6*  HGB 13.4  PLT 38*  CREATININE 0.73   Estimated Creatinine Clearance: 71.8 mL/min (by C-G formula based on Cr of 0.73). No results for input(s): VANCOTROUGH, VANCOPEAK, VANCORANDOM, GENTTROUGH, GENTPEAK, GENTRANDOM, TOBRATROUGH, TOBRAPEAK, TOBRARND, AMIKACINPEAK, AMIKACINTROU, AMIKACIN in the last 72 hours.   Microbiology: No results found for this or any previous visit (from the past 720 hour(s)).  Medical History: Past Medical History  Diagnosis Date  . Pneumonia 2009 & 2011  . Allergy     seasonal  . H/O reactive hypoglycemia   . Osteopenia   . IBS (irritable bowel syndrome)   . Brain cancer 01/13/15    MRI multiple small brain mets 10 individual lesions  . Bone cancer 01/10/15 PET    lytic lesion lt 1st rib,T9,L3 Lt acetabulum  . Bronchitis     Medications:  Infusions:  . piperacillin-tazobactam    . piperacillin-tazobactam (ZOSYN)  IV    . sodium chloride    . vancomycin     Anti-infectives    Start     Dose/Rate Route Frequency Ordered Stop   02/11/15 1400  piperacillin-tazobactam (ZOSYN) IVPB 3.375 g     3.375 g 12.5 mL/hr over 240 Minutes Intravenous 3 times per day 02/11/15 0433     02/11/15 0445  piperacillin-tazobactam (ZOSYN) IVPB 3.375 g     3.375 g 100 mL/hr over 30 Minutes Intravenous  Once 02/11/15 0431     02/11/15 0415  vancomycin (VANCOCIN) IVPB 1000 mg/200 mL premix     1,000 mg 200 mL/hr over 60 Minutes Intravenous  Once 02/11/15 0414        Assessment: Patient with sepsis and febrile neutropenia. First dose of antibiotics already given.   Goal of Therapy:  Zosyn based on renal function  Vancomycin trough level 15-20  Plan:  Follow up culture results  Zosyn 3.375gm iv x1 then Zosyn 3.375g IV Q8H infused over 4hrs.  Vancomycin '750mg'$  iv q12hr  Laura Walter 02/11/2015,4:33 AM

## 2015-02-12 LAB — CBC
HEMATOCRIT: 32.1 % — AB (ref 36.0–46.0)
Hemoglobin: 10.7 g/dL — ABNORMAL LOW (ref 12.0–15.0)
MCH: 29.2 pg (ref 26.0–34.0)
MCHC: 33.3 g/dL (ref 30.0–36.0)
MCV: 87.7 fL (ref 78.0–100.0)
PLATELETS: 35 10*3/uL — AB (ref 150–400)
RBC: 3.66 MIL/uL — ABNORMAL LOW (ref 3.87–5.11)
RDW: 13.7 % (ref 11.5–15.5)
WBC: 1.2 10*3/uL — AB (ref 4.0–10.5)

## 2015-02-12 LAB — BASIC METABOLIC PANEL
Anion gap: 5 (ref 5–15)
BUN: 21 mg/dL — ABNORMAL HIGH (ref 6–20)
CHLORIDE: 108 mmol/L (ref 101–111)
CO2: 23 mmol/L (ref 22–32)
Calcium: 7.6 mg/dL — ABNORMAL LOW (ref 8.9–10.3)
Creatinine, Ser: 0.75 mg/dL (ref 0.44–1.00)
GFR calc non Af Amer: >60 mL/min (ref >60)
GLUCOSE: 100 mg/dL — AB (ref 65–99)
POTASSIUM: 3 mmol/L — AB (ref 3.5–5.1)
SODIUM: 136 mmol/L (ref 135–145)

## 2015-02-12 MED ORDER — HYDROCERIN EX CREA
TOPICAL_CREAM | Freq: Two times a day (BID) | CUTANEOUS | Status: DC
  Administered 2015-02-12 (×3): via TOPICAL
  Administered 2015-02-13: 1 via TOPICAL
  Administered 2015-02-13 – 2015-02-15 (×4): via TOPICAL
  Filled 2015-02-12: qty 113

## 2015-02-12 MED ORDER — LEVOFLOXACIN IN D5W 750 MG/150ML IV SOLN
750.0000 mg | INTRAVENOUS | Status: DC
  Administered 2015-02-12: 750 mg via INTRAVENOUS
  Filled 2015-02-12 (×2): qty 150

## 2015-02-12 MED ORDER — PROMETHAZINE HCL 25 MG RE SUPP: 25.0000 mg | Freq: Four times a day (QID) | RECTAL | Status: DC | PRN

## 2015-02-12 MED ORDER — METOCLOPRAMIDE HCL 5 MG/ML IJ SOLN
5.0000 mg | Freq: Once | INTRAMUSCULAR | Status: AC | PRN
  Administered 2015-02-12: 5 mg via INTRAVENOUS

## 2015-02-12 MED ORDER — POTASSIUM CHLORIDE CRYS ER 20 MEQ PO TBCR: 40.0000 meq | EXTENDED_RELEASE_TABLET | Freq: Once | ORAL | Status: DC

## 2015-02-12 NOTE — Progress Notes (Signed)
[facility] PROGRESS NOTE  Laura Walter UYQ:[id number] DOB: [birth year] DOA: 02/11/2015 PCP: [name], MD  Assessment/Plan: Principal Problem:   Neutropenia with fever - Source unknown. Chest x-ray does not report any active cardiopulmonary disease - Urinalysis negative - Fevers have subsided as such will transition to Levaquin and discontinue Zosyn and vancomycin. Zosyn has been known to decrease WBC counts anyhow as such would not like to continue  Active Problems:   Primary cancer of right lung -Patient to continue follow-up with oncology for further evaluation recommendations    Bicytopenia -Most likely secondary to chemotherapy as patient has history of metastatic non-small cell lung cancer - d/c Zosyn and place on Levaquin    Diarrhea -Resolved  Hypokalemia - will replace orally and reassess  Code Status: Full Family Communication: None at bedside Disposition Plan: none   Consultants:  None  Procedures:  None  Antibiotics:  Levaquin  HPI/Subjective: Pt has no new complaints today. Reports improvement in condition.  Objective: Filed Vitals:   02/12/15 0531  BP: 117/83  Pulse: 98  Temp: 97.9 F (36.6 C)  Resp: 20    Intake/Output Summary (Last 24 hours) at 02/12/15 1425 Last data filed at 02/11/15 1848  Gross per 24 hour  Intake    900 ml  Output      0 ml  Net    900 ml   Filed Weights   02/11/15 0545  Weight: 63.005 kg (138 lb 14.4 oz)    Exam:   General:  Pt in nad, alert and awake  Cardiovascular: rrr, no mrg  Respiratory: cta bl, no wheezes  Abdomen: soft, NT, nd  Musculoskeletal: no cyanosis or clubbing   Data Reviewed: Basic Metabolic Panel:  Recent Labs Lab 02/11/15 0308 02/12/15 1147  NA 133* 136  K 3.4* 3.0*  CL 100* 108  CO2 23 23  GLUCOSE 141* 100*  BUN 31* 21*  CREATININE 0.73 0.75  CALCIUM 7.8* 7.6*   Liver Function Tests:  Recent Labs Lab 02/11/15 0308  AST 22  ALT 21  ALKPHOS 56   BILITOT 1.3*  PROT 5.9*  ALBUMIN 3.0*   No results for input(s): LIPASE, AMYLASE in the last 168 hours. No results for input(s): AMMONIA in the last 168 hours. CBC:  Recent Labs Lab 02/11/15 0308 02/12/15 1147  WBC 1.6* 1.2*  NEUTROABS 1.3*  --   HGB 13.4 10.7*  HCT 40.0 32.1*  MCV 89.1 87.7  PLT 38* 35*   Cardiac Enzymes: No results for input(s): CKTOTAL, CKMB, CKMBINDEX, TROPONINI in the last 168 hours. BNP (last 3 results) No results for input(s): BNP in the last 8760 hours.  ProBNP (last 3 results) No results for input(s): PROBNP in the last 8760 hours.  CBG: No results for input(s): GLUCAP in the last 168 hours.  Recent Results (from the past 240 hour(s))  Urine culture     Status: None (Preliminary result)   Collection Time: 02/11/15  7:49 AM  Result Value Ref Range Status   Specimen Description URINE, RANDOM  Final   Special Requests NONE  Final   Culture   Final    NO GROWTH < 24 HOURS Performed at [facility]    Report Status PENDING  Incomplete     Studies: Dg Chest 2 View  02/11/2015   CLINICAL DATA:  Febrile, cough and shortness of breath. History of metastatic RIGHT lung cancer. Status post radiation 3 days ago.  EXAM: CHEST  2 VIEW  COMPARISON:  Chest  radiograph January 24, 2015 and CT chest January 05, 2015  FINDINGS: RIGHT upper lobe mass, similar to prior chest radiograph. No pleural effusion. Cardiomediastinal silhouette is unremarkable. No pneumothorax. Patient is mildly osteopenic. Mild approximate T9 pathologic fracture better characterized on prior CT.  IMPRESSION: No active cardiopulmonary disease.  RIGHT upper lobe mass correspond to patient's known primary lung neoplasm.   Electronically Signed   By: Elon Alas M.D.   On: 02/11/2015 04:05    Scheduled Meds: . dexamethasone  4 mg Oral BID  . feeding supplement (ENSURE ENLIVE)  237 mL Oral BID BM  . hydrocerin   Topical BID  . levofloxacin (LEVAQUIN) IV  750 mg Intravenous Q24H  .  sodium chloride  3 mL Intravenous Q12H   Continuous Infusions: . sodium chloride 75 mL/hr at 02/12/15 1042     Time spent: > 35 minutes    Velvet Bathe  [facility] Pager [phone] If 7PM-7AM, please contact night-coverage at [email or web address], password [facility] 02/12/2015, 2:25 PM  LOS: 1 day

## 2015-02-12 NOTE — Progress Notes (Signed)
ANTIBIOTIC CONSULT NOTE - INITIAL  Pharmacy Consult for Levofloxacin Indication: FUO in neutropenic patient  Allergies  Allergen Reactions  . Sulfonamide Derivatives Rash    Childhood reaction    Patient Measurements: Height: '5\' 7"'$  (170.2 cm) Weight: 138 lb 14.4 oz (63.005 kg) IBW/kg (Calculated) : 61.6  Vital Signs: Temp: 97.9 F (36.6 C) (07/10 0531) Temp Source: Oral (07/10 0531) BP: 117/83 mmHg (07/10 0531) Pulse Rate: 98 (07/10 0531) Intake/Output from previous day: 07/09 0701 - 07/10 0700 In: 1110 [P.O.:210; I.V.:900] Out: 250 [Urine:250] Intake/Output from this shift:    Labs:  Recent Labs  02/11/15 0308  WBC 1.6*  HGB 13.4  PLT 38*  CREATININE 0.73   Estimated Creatinine Clearance: 71.8 mL/min (by C-G formula based on Cr of 0.73). No results for input(s): VANCOTROUGH, VANCOPEAK, VANCORANDOM, GENTTROUGH, GENTPEAK, GENTRANDOM, TOBRATROUGH, TOBRAPEAK, TOBRARND, AMIKACINPEAK, AMIKACINTROU, AMIKACIN in the last 72 hours.   Microbiology: Recent Results (from the past 720 hour(s))  Urine culture     Status: None (Preliminary result)   Collection Time: 02/11/15  7:49 AM  Result Value Ref Range Status   Specimen Description URINE, RANDOM  Final   Special Requests NONE  Final   Culture   Final    NO GROWTH < 24 HOURS Performed at [facility]    Report Status PENDING  Incomplete    Medical History: Past Medical History  Diagnosis Date  . Pneumonia 2009 & 2011  . Allergy     seasonal  . H/O reactive hypoglycemia   . Osteopenia   . IBS (irritable bowel syndrome)   . Brain cancer 01/13/15    MRI multiple small brain mets 10 individual lesions  . Bone cancer 01/10/15 PET    lytic lesion lt 1st rib,T9,L3 Lt acetabulum  . 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 Patient initially placed on Vancomycin and Zosyn, now transitioned to Levofloxacin for FUO in setting of neutropenia.  7/9 >> Vancomycin >> 7/10 7/9 >> Zosyn >> 7/10 7/10 >> Levaquin >>    7/9 blood x 2: sent 7/9 urine: NGTD   Goal of Therapy:  Appropriate antibiotic dosing for renal function and indication Eradication of infection  Plan:   Levofloxacin 750 mg IV q24h  Monitor renal function, cultures, clinical course.  Monitor QTc closely while on concomitant Zofran.   Lindell Spar, PharmD, BCPS Pager: [phone] 02/12/2015 11:11 AM

## 2015-02-13 ENCOUNTER — Telehealth: Payer: Self-pay | Admitting: *Deleted

## 2015-02-13 ENCOUNTER — Ambulatory Visit: Payer: BC Managed Care – PPO

## 2015-02-13 DIAGNOSIS — D61818 Other pancytopenia: Secondary | ICD-10-CM

## 2015-02-13 DIAGNOSIS — C3411 Malignant neoplasm of upper lobe, right bronchus or lung: Secondary | ICD-10-CM

## 2015-02-13 DIAGNOSIS — D709 Neutropenia, unspecified: Secondary | ICD-10-CM

## 2015-02-13 LAB — BASIC METABOLIC PANEL
ANION GAP: 6 (ref 5–15)
BUN: 18 mg/dL (ref 6–20)
CO2: 20 mmol/L — ABNORMAL LOW (ref 22–32)
CREATININE: 0.63 mg/dL (ref 0.44–1.00)
Calcium: 7.7 mg/dL — ABNORMAL LOW (ref 8.9–10.3)
Chloride: 107 mmol/L (ref 101–111)
GLUCOSE: 101 mg/dL — AB (ref 65–99)
POTASSIUM: 3 mmol/L — AB (ref 3.5–5.1)
Sodium: 133 mmol/L — ABNORMAL LOW (ref 135–145)

## 2015-02-13 LAB — CBC
HCT: 30.7 % — ABNORMAL LOW (ref 36.0–46.0)
HEMOGLOBIN: 10.5 g/dL — AB (ref 12.0–15.0)
MCH: 29.7 pg (ref 26.0–34.0)
MCHC: 34.2 g/dL (ref 30.0–36.0)
MCV: 86.7 fL (ref 78.0–100.0)
Platelets: 36 10*3/uL — ABNORMAL LOW (ref 150–400)
RBC: 3.54 MIL/uL — AB (ref 3.87–5.11)
RDW: 13.6 % (ref 11.5–15.5)
WBC: 1.3 10*3/uL — AB (ref 4.0–10.5)

## 2015-02-13 LAB — URINE CULTURE: Culture: NO GROWTH

## 2015-02-13 LAB — CLOSTRIDIUM DIFFICILE BY PCR: Toxigenic C. Difficile by PCR: NEGATIVE

## 2015-02-13 MED ORDER — PSYLLIUM 95 % PO PACK
1.0000 | PACK | Freq: Every day | ORAL | Status: DC
  Administered 2015-02-13 – 2015-02-15 (×3): 1 via ORAL
  Filled 2015-02-13 (×3): qty 1

## 2015-02-13 MED ORDER — AMOXICILLIN-POT CLAVULANATE 875-125 MG PO TABS
1.0000 | ORAL_TABLET | Freq: Two times a day (BID) | ORAL | Status: DC
  Administered 2015-02-13 – 2015-02-15 (×5): 1 via ORAL
  Filled 2015-02-13 (×6): qty 1

## 2015-02-13 NOTE — Telephone Encounter (Signed)
error 

## 2015-02-13 NOTE — Progress Notes (Signed)
Subjective: The patient is seen and examined today. Her partner was at the bedside. 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 She status post whole brain irradiation for multiple metastatic brain lesions. The patient was just started treatment with Gilotrif, EGFR tyrosine kinase inhibitor 2 days ago and received only 2 doses. She was admitted to [facility] with increasing fatigue and weakness as well as few episodes of diarrhea. During her evaluation CBC showed significant leukocytopenia and neutropenia in addition to thrombocytopenia. Chest x-ray showed no acute cardiopulmonary disease. The patient was started on treatment with Zosyn and vancomycin. She felt much better yesterday. Her antibiotics coverage was changed to Augmentin. The patient feels a little bit more fatigued today. The patient denied having any bleeding issues.  Objective: Vital signs in last 24 hours: Temp:  [97.6 F (36.4 C)-98.2 F (36.8 C)] 98.2 F (36.8 C) (07/11 1525) Pulse Rate:  [77-80] 78 (07/11 1525) Resp:  [18] 18 (07/11 1525) BP: (114-151)/(72-86) 114/72 mmHg (07/11 1525) SpO2:  [99 %-100 %] 99 % (07/11 1525)  Intake/Output from previous day: 07/10 0701 - 07/11 0700 In: 320 [P.O.:320] Out: -  Intake/Output this shift: Total I/O In: 120 [P.O.:120] Out: -   General appearance: alert, cooperative, fatigued and no distress Resp: clear to auscultation bilaterally Cardio: regular rate and rhythm, S1, S2 normal, no murmur, click, rub or gallop GI: soft, non-tender; bowel sounds normal; no masses,  no organomegaly Extremities: extremities normal, atraumatic, no cyanosis or edema  Lab Results:   Recent Labs  02/12/15 1147 02/13/15 0513  WBC 1.2* 1.3*  HGB 10.7* 10.5*  HCT 32.1* 30.7*  PLT 35* 36*   BMET  Recent Labs  02/12/15 1147 02/13/15 0513  NA 136 133*   K 3.0* 3.0*  CL 108 107  CO2 23 20*  GLUCOSE 100* 101*  BUN 21* 18  CREATININE 0.75 0.63  CALCIUM 7.6* 7.7*    Studies/Results: No results found.  Medications: I have reviewed the patient's current medications.   Assessment/Plan: 1) metastatic non-small cell lung cancer, adenocarcinoma with positive EGFR mutation. Her treatment which she retrieve is currently on hold until recovery of her neutropenia and thrombocytopenia. 2) pancytopenia: unclear etiology but it could be secondary to recent infection or recent radiotherapy. I would consider starting the patient on Granix 300 mcg subcutaneously on daily basis until her absolute neutrophil count is over 1000.  We will continue to monitor the platelets count closely and consider the patient for platelet transfusion if less than 20,000 or if she has any significant bleeding issues. Thank you so much for taking good care of Ms. Burbage. I will continue to follow up the patient with you and assist in her management an as-needed basis.   LOS: 2 days    Whittley Carandang K. 02/13/2015

## 2015-02-13 NOTE — Progress Notes (Signed)
[facility] PROGRESS NOTE  Laura Walter QPY:[id number] DOB: 1950/12/21 DOA: 02/11/2015 PCP: [name], MD  Assessment/Plan: Principal Problem:   Neutropenia with fever - Source unknown. Chest x-ray does not report any active cardiopulmonary disease - Urinalysis negative - Patient had adverse reaction to Levaquin she reports. As such will transition from Levaquin to Augmentin.  Active Problems:   Primary cancer of right lung -Patient to continue follow-up with oncology for further evaluation recommendations    Bicytopenia -Most likely secondary to chemotherapy as patient has history of metastatic non-small cell lung cancer - d/c Zosyn and place on Levaquin    Diarrhea -GI pathogen panel pending. We'll obtain C. difficile PCR - Could be source of fever if patient has viral gastroenteritis  Hypokalemia - will replace orally and reassess  Code Status: Full Family Communication: None at bedside Disposition Plan: none   Consultants:  None  Procedures:  None  Antibiotics:  Levaquin>>> Augmentin 02/13/2015  HPI/Subjective: The patient's main concern today is continued diarrhea. She reports it has not improved  Objective: Filed Vitals:   02/13/15 1525  BP: 114/72  Pulse: 78  Temp: 98.2 F (36.8 C)  Resp: 18    Intake/Output Summary (Last 24 hours) at 02/13/15 1730 Last data filed at 02/13/15 0900  Gross per 24 hour  Intake    120 ml  Output      0 ml  Net    120 ml   Filed Weights   02/11/15 0545  Weight: 63.005 kg (138 lb 14.4 oz)    Exam:   General:  Pt in nad, alert and awake  Cardiovascular: No cyanosis  Respiratory: No increased work of breathing on room air, no wheezes, equal chest rise  Abdomen: soft, no guarding, nd  Musculoskeletal: no cyanosis or clubbing   Data Reviewed: Basic Metabolic Panel:  Recent Labs Lab 02/11/15 0308 02/12/15 1147 02/13/15 0513  NA 133* 136 133*  K 3.4* 3.0* 3.0*  CL 100* 108 107  CO2 23  23 20*  GLUCOSE 141* 100* 101*  BUN 31* 21* 18  CREATININE 0.73 0.75 0.63  CALCIUM 7.8* 7.6* 7.7*   Liver Function Tests:  Recent Labs Lab 02/11/15 0308  AST 22  ALT 21  ALKPHOS 56  BILITOT 1.3*  PROT 5.9*  ALBUMIN 3.0*   No results for input(s): LIPASE, AMYLASE in the last 168 hours. No results for input(s): AMMONIA in the last 168 hours. CBC:  Recent Labs Lab 02/11/15 0308 02/12/15 1147 02/13/15 0513  WBC 1.6* 1.2* 1.3*  NEUTROABS 1.3*  --   --   HGB 13.4 10.7* 10.5*  HCT 40.0 32.1* 30.7*  MCV 89.1 87.7 86.7  PLT 38* 35* 36*   Cardiac Enzymes: No results for input(s): CKTOTAL, CKMB, CKMBINDEX, TROPONINI in the last 168 hours. BNP (last 3 results) No results for input(s): BNP in the last 8760 hours.  ProBNP (last 3 results) No results for input(s): PROBNP in the last 8760 hours.  CBG: No results for input(s): GLUCAP in the last 168 hours.  Recent Results (from the past 240 hour(s))  Blood culture (routine x 2)     Status: None (Preliminary result)   Collection Time: 02/11/15  3:00 AM  Result Value Ref Range Status   Specimen Description BLOOD RIGHT HAND  Final   Special Requests BOTTLES DRAWN AEROBIC ONLY 3CC  Final   Culture   Final    NO GROWTH 2 DAYS Performed at [facility]    Report Status  PENDING  Incomplete  Blood culture (routine x 2)     Status: None (Preliminary result)   Collection Time: 02/11/15  3:08 AM  Result Value Ref Range Status   Specimen Description BLOOD LEFT ANTECUBITAL  Final   Special Requests BOTTLES DRAWN AEROBIC AND ANAEROBIC 5CC  Final   Culture   Final    NO GROWTH 2 DAYS Performed at [facility]    Report Status PENDING  Incomplete  Urine culture     Status: None   Collection Time: 02/11/15  7:49 AM  Result Value Ref Range Status   Specimen Description URINE, RANDOM  Final   Special Requests NONE  Final   Culture   Final    NO GROWTH 2 DAYS Performed at [facility]    Report Status  02/13/2015 FINAL  Final  Clostridium Difficile by PCR (not at [facility])     Status: None   Collection Time: 02/13/15  2:37 PM  Result Value Ref Range Status   C difficile by pcr NEGATIVE NEGATIVE Final     Studies: No results found.  Scheduled Meds: . amoxicillin-clavulanate  1 tablet Oral Q12H  . dexamethasone  4 mg Oral BID  . feeding supplement (ENSURE ENLIVE)  237 mL Oral BID BM  . hydrocerin   Topical BID  . potassium chloride  40 mEq Oral Once  . sodium chloride  3 mL Intravenous Q12H   Continuous Infusions:     Time spent: > 35 minutes    Velvet Bathe  [facility] Pager [phone] If 7PM-7AM, please contact night-coverage at [email or web address], password [facility] 02/13/2015, 5:30 PM  LOS: 2 days

## 2015-02-13 NOTE — Telephone Encounter (Signed)
Received  Flyer of telephone call message today at 110pm from dated 02/11/15 1:31pm, pthad called with fever 101, I was going to call patyient home to see if okay and saw that she was admitted to [facility] room 1512 yesterday and called Dr. Julien Nordmann nurse phone spoke with [facility] she will notify Dr. Julien Nordmann,  1:15 PM

## 2015-02-14 ENCOUNTER — Encounter: Payer: Self-pay | Admitting: *Deleted

## 2015-02-14 ENCOUNTER — Other Ambulatory Visit: Payer: BC Managed Care – PPO

## 2015-02-14 ENCOUNTER — Ambulatory Visit: Payer: BC Managed Care – PPO

## 2015-02-14 LAB — CBC WITH DIFFERENTIAL/PLATELET
Basophils Absolute: 0 10*3/uL (ref 0.0–0.1)
Basophils Relative: 0 % (ref 0–1)
EOS PCT: 6 % — AB (ref 0–5)
Eosinophils Absolute: 0.1 10*3/uL (ref 0.0–0.7)
HEMATOCRIT: 32.1 % — AB (ref 36.0–46.0)
HEMOGLOBIN: 10.9 g/dL — AB (ref 12.0–15.0)
LYMPHS ABS: 0.2 10*3/uL — AB (ref 0.7–4.0)
Lymphocytes Relative: 10 % — ABNORMAL LOW (ref 12–46)
MCH: 29.5 pg (ref 26.0–34.0)
MCHC: 34 g/dL (ref 30.0–36.0)
MCV: 87 fL (ref 78.0–100.0)
MONO ABS: 0.2 10*3/uL (ref 0.1–1.0)
Monocytes Relative: 11 % (ref 3–12)
NEUTROS ABS: 1.1 10*3/uL — AB (ref 1.7–7.7)
Neutrophils Relative %: 73 % (ref 43–77)
PLATELETS: 38 10*3/uL — AB (ref 150–400)
RBC: 3.69 MIL/uL — AB (ref 3.87–5.11)
RDW: 13.8 % (ref 11.5–15.5)
WBC: 1.6 10*3/uL — AB (ref 4.0–10.5)

## 2015-02-14 LAB — BASIC METABOLIC PANEL
ANION GAP: 7 (ref 5–15)
BUN: 21 mg/dL — AB (ref 6–20)
CO2: 24 mmol/L (ref 22–32)
Calcium: 7.3 mg/dL — ABNORMAL LOW (ref 8.9–10.3)
Chloride: 106 mmol/L (ref 101–111)
Creatinine, Ser: 0.56 mg/dL (ref 0.44–1.00)
GLUCOSE: 93 mg/dL (ref 65–99)
POTASSIUM: 2.6 mmol/L — AB (ref 3.5–5.1)
SODIUM: 137 mmol/L (ref 135–145)

## 2015-02-14 LAB — MAGNESIUM: Magnesium: 1.7 mg/dL (ref 1.7–2.4)

## 2015-02-14 MED ORDER — POTASSIUM CHLORIDE 10 MEQ/100ML IV SOLN
10.0000 meq | INTRAVENOUS | Status: AC
  Administered 2015-02-14 (×4): 10 meq via INTRAVENOUS
  Filled 2015-02-14 (×4): qty 100

## 2015-02-14 MED ORDER — TBO-FILGRASTIM 300 MCG/0.5ML ~~LOC~~ SOSY
300.0000 ug | PREFILLED_SYRINGE | Freq: Every morning | SUBCUTANEOUS | Status: DC
  Administered 2015-02-14: 300 ug via SUBCUTANEOUS
  Filled 2015-02-14 (×3): qty 0.5

## 2015-02-14 MED ORDER — LOPERAMIDE HCL 2 MG PO CAPS
4.0000 mg | ORAL_CAPSULE | Freq: Once | ORAL | Status: AC
  Administered 2015-02-14: 4 mg via ORAL
  Filled 2015-02-14: qty 2

## 2015-02-14 NOTE — Progress Notes (Signed)
Oncology Nurse Navigator Documentation  Oncology Nurse Navigator Flowsheets 02/14/2015  Navigator Encounter Type Other  Patient Visit Type Inpatient/spoke with patient today in hospital.  Spoke with her regarding starting oral biologic once she leaves hospital.  She verbalized understanding  Treatment Phase Treatment, oral biologic   Barriers/Navigation Needs Education  Education Understanding Cancer/ Treatment Options  Coordination of Care Education   Support Groups/Services Friends   Time Spent with Patient (Retired) 30

## 2015-02-14 NOTE — Progress Notes (Signed)
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 Who presented with neutropenia with fever and complains of diarrhea. C. difficile negative. GI pathogen panel pending.  Assessment/Plan: Principal Problem:   Neutropenia with fever - Source unknown. Chest x-ray does not report any active cardiopulmonary disease - Urinalysis negative - We'll continue Augmentin although there is no clear source of bacterial infection. Patient currently has diarrhea which could be viral in etiology. Low threshold to discontinue antibiotics. Will await GI pathogen panel and/or improvement in WBC levels prior to discharging.  Active Problems:   Primary cancer of right lung -Patient to continue follow-up with oncology for further evaluation recommendations    Bicytopenia -Most likely secondary to chemotherapy as patient has history of metastatic non-small cell lung cancer - Patient is currently on Augmentin as listed above    Diarrhea -GI pathogen panel pending. C. difficile negative will administer Imodium - Could be source of fever if patient has viral gastroenteritis  Hypokalemia - Will replace IV Cessna a.m.  Code Status: Full Family Communication: None at bedside Disposition Plan: none   Consultants:  None  Procedures:  None  Antibiotics:  Levaquin>>> Augmentin 02/13/2015  HPI/Subjective: Diarrhea still a problem but has slowed down.  Objective: Filed Vitals:   02/14/15 1316  BP: 117/72  Pulse: 86  Temp: 97.9 F (36.6 C)  Resp: 20   No intake or output data in the 24 hours ending 02/14/15 1914 Filed Weights   02/11/15 0545 02/14/15 0554  Weight: 63.005 kg (138  lb 14.4 oz) 65.499 kg (144 lb 6.4 oz)    Exam:   General:  Pt in nad, alert and awake  Cardiovascular: No cyanosis  Respiratory: No increased work of breathing on room air, no wheezes, equal chest rise  Abdomen: soft, no guarding, nd  Musculoskeletal: no cyanosis or clubbing   Data Reviewed: Basic Metabolic Panel:  Recent Labs Lab 02/11/15 0308 02/12/15 1147 02/13/15 0513 02/14/15 0520  NA 133* 136 133* 137  K 3.4* 3.0* 3.0* 2.6*  CL 100* 108 107 106  CO2 23 23 20* 24  GLUCOSE 141* 100* 101* 93  BUN 31* 21* 18 21*  CREATININE 0.73 0.75 0.63 0.56  CALCIUM 7.8* 7.6* 7.7* 7.3*  MG  --   --   --  1.7   Liver Function Tests:  Recent Labs Lab 02/11/15 0308  AST 22  ALT 21  ALKPHOS 56  BILITOT 1.3*  PROT 5.9*  ALBUMIN 3.0*   No results for input(s): LIPASE, AMYLASE in the last 168 hours. No results for input(s): AMMONIA in the last 168 hours. CBC:  Recent Labs Lab 02/11/15 0308 02/12/15 1147 02/13/15 0513 02/14/15 0520  WBC 1.6* 1.2* 1.3* 1.6*  NEUTROABS 1.3*  --   --  1.1*  HGB 13.4 10.7* 10.5* 10.9*  HCT 40.0 32.1* 30.7* 32.1*  MCV 89.1 87.7 86.7 87.0  PLT 38* 35* 36* 38*   Cardiac Enzymes: No results for input(s): CKTOTAL, CKMB, CKMBINDEX, TROPONINI in the last 168 hours. BNP (last 3 results) No results for input(s): BNP in the last 8760 hours.  ProBNP (last 3 results) No results for input(s): PROBNP in the last 8760 hours.  CBG: No results for input(s): GLUCAP in the last 168 hours.  Recent Results (from the past 240 hour(s))  Blood culture (routine x 2)     Status: None (Preliminary result)   Collection Time: 02/11/15  3:00 AM  Result Value Ref Range Status   Specimen Description BLOOD RIGHT HAND  Final   Special Requests BOTTLES DRAWN AEROBIC ONLY 3CC  Final   Culture   Final    NO GROWTH 3 DAYS Performed at [facility]    Report Status PENDING  Incomplete  Blood culture (routine x 2)     Status: None (Preliminary result)    Collection Time: 02/11/15  3:08 AM  Result Value Ref Range Status   Specimen Description BLOOD LEFT ANTECUBITAL  Final   Special Requests BOTTLES DRAWN AEROBIC AND ANAEROBIC 5CC  Final   Culture   Final    NO GROWTH 3 DAYS Performed at [facility]    Report Status PENDING  Incomplete  Urine culture     Status: None   Collection Time: 02/11/15  7:49 AM  Result Value Ref Range Status   Specimen Description URINE, RANDOM  Final   Special Requests NONE  Final   Culture   Final    NO GROWTH 2 DAYS Performed at [facility]    Report Status 02/13/2015 FINAL  Final  Clostridium Difficile by PCR (not at [facility])     Status: None   Collection Time: 02/13/15  2:37 PM  Result Value Ref Range Status   C difficile by pcr NEGATIVE NEGATIVE Final     Studies: No results found.  Scheduled Meds: . amoxicillin-clavulanate  1 tablet Oral Q12H  . dexamethasone  4 mg Oral BID  . feeding supplement (ENSURE ENLIVE)  237 mL Oral BID BM  . hydrocerin   Topical BID  . potassium chloride  40 mEq Oral Once  . psyllium  1 packet Oral Daily  . sodium chloride  3 mL Intravenous Q12H  . Tbo-filgastrim (GRANIX) SQ  300 mcg Subcutaneous q morning - 10a   Continuous Infusions:     Time spent: > 35 minutes    Velvet Bathe  [facility] Pager [phone] If 7PM-7AM, please contact night-coverage at [email or web address], password [facility] 02/14/2015, 7:14 PM  LOS: 3 days

## 2015-02-14 NOTE — Care Management Note (Signed)
Case Management Note  Patient Details  Name: MILEAH HEMMER MRN: [id number] Date of Birth: 02-19-1951  Subjective/Objective: 64 y/o f admitted w/Neutropenia fever. Hx: Lung Ca w/mets.From home.                   Action/Plan:Monitor progress for d/c needs.   Expected Discharge Date:                  Expected Discharge Plan:  Home/Self Care  In-House Referral:     Discharge planning Services  CM Consult  Post Acute Care Choice:    Choice offered to:     DME Arranged:    DME Agency:     HH Arranged:    HH Agency:     Status of Service:  In process, will continue to follow  Medicare Important Message Given:    Date Medicare IM Given:    Medicare IM give by:    Date Additional Medicare IM Given:    Additional Medicare Important Message give by:     If discussed at [place] of Stay Meetings, dates discussed:    Additional Comments:  Dessa Phi, RN 02/14/2015, 4:24 PM

## 2015-02-14 NOTE — Progress Notes (Signed)
CRITICAL VALUE ALERT  Critical value received:  K+ 2.6  Date of notification:  02/14/15  Time of notification:  0637  Critical value read back:Yes.    Nurse who received alert:  R Rakhi Romagnoli. RN  MD notified (1st page):  TRH1  Time of first page:  0650  MD notified (2nd page):  Time of second page:  Responding MD:    Time MD responded:

## 2015-02-15 ENCOUNTER — Ambulatory Visit: Payer: BC Managed Care – PPO

## 2015-02-15 DIAGNOSIS — D7589 Other specified diseases of blood and blood-forming organs: Secondary | ICD-10-CM

## 2015-02-15 DIAGNOSIS — E876 Hypokalemia: Secondary | ICD-10-CM | POA: Diagnosis present

## 2015-02-15 DIAGNOSIS — R197 Diarrhea, unspecified: Secondary | ICD-10-CM

## 2015-02-15 DIAGNOSIS — E46 Unspecified protein-calorie malnutrition: Secondary | ICD-10-CM | POA: Diagnosis present

## 2015-02-15 LAB — BASIC METABOLIC PANEL
Anion gap: 8 (ref 5–15)
BUN: 18 mg/dL (ref 6–20)
CO2: 24 mmol/L (ref 22–32)
Calcium: 7.6 mg/dL — ABNORMAL LOW (ref 8.9–10.3)
Chloride: 108 mmol/L (ref 101–111)
Creatinine, Ser: 0.63 mg/dL (ref 0.44–1.00)
Glucose, Bld: 84 mg/dL (ref 65–99)
POTASSIUM: 3 mmol/L — AB (ref 3.5–5.1)
Sodium: 140 mmol/L (ref 135–145)

## 2015-02-15 LAB — CBC
HCT: 31 % — ABNORMAL LOW (ref 36.0–46.0)
Hemoglobin: 10.5 g/dL — ABNORMAL LOW (ref 12.0–15.0)
MCH: 29.5 pg (ref 26.0–34.0)
MCHC: 33.9 g/dL (ref 30.0–36.0)
MCV: 87.1 fL (ref 78.0–100.0)
PLATELETS: 32 10*3/uL — AB (ref 150–400)
RBC: 3.56 MIL/uL — ABNORMAL LOW (ref 3.87–5.11)
RDW: 13.8 % (ref 11.5–15.5)
WBC: 4.9 10*3/uL (ref 4.0–10.5)

## 2015-02-15 MED ORDER — HYDROCODONE-HOMATROPINE 5-1.5 MG/5ML PO SYRP: 5.0000 mL | ORAL_SOLUTION | Freq: Four times a day (QID) | ORAL | Status: DC | PRN

## 2015-02-15 MED ORDER — ENSURE ENLIVE PO LIQD: 237.0000 mL | Freq: Two times a day (BID) | ORAL | Status: DC

## 2015-02-15 MED ORDER — AMOXICILLIN-POT CLAVULANATE 875-125 MG PO TABS: 1.0000 | ORAL_TABLET | Freq: Two times a day (BID) | ORAL | Status: AC

## 2015-02-15 MED ORDER — POTASSIUM CHLORIDE CRYS ER 20 MEQ PO TBCR: 40.0000 meq | EXTENDED_RELEASE_TABLET | Freq: Every day | ORAL | Status: DC

## 2015-02-15 MED ORDER — ONDANSETRON 4 MG PO TBDP: 4.0000 mg | ORAL_TABLET | Freq: Three times a day (TID) | ORAL | Status: DC | PRN

## 2015-02-15 MED ORDER — POTASSIUM CHLORIDE CRYS ER 20 MEQ PO TBCR
40.0000 meq | EXTENDED_RELEASE_TABLET | ORAL | Status: DC
  Administered 2015-02-15: 40 meq via ORAL
  Filled 2015-02-15: qty 2

## 2015-02-15 NOTE — Discharge Instructions (Signed)

## 2015-02-15 NOTE — Progress Notes (Signed)
Patient verbalized d/c teaching.  Left hospital via w/c and personal vehicle.

## 2015-02-15 NOTE — Discharge Summary (Addendum)
Physician Discharge Summary  LYNN RECENDIZ MWU:[id number] DOB: 1950-09-28 DOA: 02/11/2015  PCP: [name], MD  Admit date: 02/11/2015 Discharge date: 02/15/2015  Time spent: 35 minutes  Recommendations for Outpatient Follow-up:  1. Discharge home with outpatient follow-up with oncologist. Please check potassium in doing outpatient follow-up.  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 Check chest x-ray negative for any infiltrates. UA unremarkable. Patient placed on empiric vancomycin and Zosyn and transitioned to oral Augmentin. Stool for C. difficile was negative.  Hospital Course:  Neutropenic fever Unclear source. Chest x-ray and UA unremarkable. Blood cultures negative for growth. Patient placed on empiric vancomycin and Zosyn on admission and transitioned to oral Augmentin. No further diarrhea since last evening. Stool for C. difficile negative. GI pathogen panel pending and can be followed up as outpatient. Neutropenia now resolved. -She will be discharged on oral Augmentin to complete a seven-day course of antibiotics. She  has follow-up with Dr. Julien Nordmann 7/15.  Active problems Primary right lung cancer with brain metastases Follow-up with oncologist  as outpatient.  Pancytopenia Secondary to chemotherapy. Neutropenia resolved. Follow-up as outpatient.  Severe hypokalemia Associated with diarrhea. Replenished with and discharged on oral potassium supplement. Needs to follow potassium level as outpatient.   Protein calorie malnutrition, mild   added supplements.   Procedures:  None  Consultations:  Dr. Julien Nordmann  Antibiotics: Vancomycin and zosyn on admission 7/9-7/11 Augmentin 7/11-7/15  CODE STATUS: Full code  Diet: Regular with supplements   Discharge Exam: Filed Vitals:   02/15/15 0550  BP: 132/76  Pulse: 82  Temp: 98.5 F (36.9 C)  Resp: 20    General: Elderly thin built female in no acute distress HEENT: Pallor present, moist oral mucosa, no icterus, supple neck Chest: Clear to auscultation bilaterally, no added sounds CVS: Normal S1 and S2, no murmurs or gallop GI: Soft, nondistended, nontender, bowel sounds present Musculoskeletal: Warm, no edema CNS: Alert and oriented  Discharge Instructions    Current Discharge Medication List    START taking these medications   Details  amoxicillin-clavulanate (AUGMENTIN) 875-125 MG per tablet Take 1 tablet by mouth every 12 (twelve) hours. Qty: 6 tablet, Refills: 0    feeding supplement, ENSURE ENLIVE, (ENSURE ENLIVE) LIQD Take 237 mLs by mouth 2 (two) times daily between meals. Qty: 237 mL, Refills: 12    ondansetron (ZOFRAN-ODT) 4 MG disintegrating tablet Take 1 tablet (4 mg total) by mouth every 8 (eight) hours as needed for nausea or vomiting. Qty: 30 tablet, Refills: 0    potassium chloride SA (K-DUR,KLOR-CON) 20 MEQ tablet Take 2 tablets (40 mEq total) by mouth daily. Qty: 7 tablet, Refills: 0  CONTINUE these medications which have NOT CHANGED   Details  afatinib dimaleate (GILOTRIF) 40 MG tablet Take 1 tablet  (40 mg total) by mouth daily. Take on an empty stomach 1hr before or 2 hrs after meals. Qty: 30 tablet, Refills: 2   Associated Diagnoses: Primary cancer of right lung    dexamethasone (DECADRON) 4 MG tablet Take 1 tablet (4 mg total) by mouth 2 (two) times daily. Qty: 40 tablet, Refills: 0   Associated Diagnoses: Primary cancer of right lung    HYDROcodone-homatropine (HYCODAN) 5-1.5 MG/5ML syrup Take 5 mLs by mouth every 6 (six) hours as needed for cough. Qty: 120 mL, Refills: 0   Associated Diagnoses: Primary cancer of right lung    ibuprofen (ADVIL,MOTRIN) 200 MG tablet Take 200 mg by mouth every 6 (six) hours as needed for moderate pain.    Dextromethorphan HBr 15 MG/5ML LIQD Take 5 mLs by mouth every 5 (five) hours as needed (cough).    emollient (BIAFINE) cream Apply 1 application topically daily.    Misc. Devices (CANE) MISC by Does not apply route.   Associated Diagnoses: Primary cancer of right lung      STOP taking these medications     ondansetron (ZOFRAN) 4 MG tablet      oxyCODONE-acetaminophen (PERCOCET/ROXICET) 5-325 MG per tablet      scopolamine (TRANSDERM-SCOP) 1.5 MG      UNABLE TO FIND        Allergies  Allergen Reactions  . Sulfonamide Derivatives Rash    Childhood reaction   Follow-up Information    Follow up with Eilleen Kempf., MD In 2 days.   Specialty:  Oncology   Contact information:   [address] [place] [address] [phone]        The results of significant diagnostics from this hospitalization (including imaging, microbiology, ancillary and laboratory) are listed below for reference.    Significant Diagnostic Studies: Dg Chest 1 View  01/24/2015   CLINICAL DATA:  Status post right lung biopsy  EXAM: CHEST  1 VIEW  COMPARISON:  01/05/2015  FINDINGS: Right upper lobe mass lesion is again identified similar to that seen on the prior exam. No post biopsy pneumothorax is noted. Left lung remains clear. Cardiac shadow is  stable.  IMPRESSION: No evidence of post biopsy pneumothorax on the right.   Electronically Signed   By: Inez Catalina M.D.   On: 01/24/2015 11:16   Dg Chest 2 View  02/11/2015   CLINICAL DATA:  Febrile, cough and shortness of breath. History of metastatic RIGHT lung cancer. Status post radiation 3 days ago.  EXAM: CHEST  2 VIEW  COMPARISON:  Chest radiograph January 24, 2015 and CT chest January 05, 2015  FINDINGS: RIGHT upper lobe mass, similar to prior chest radiograph. No pleural effusion. Cardiomediastinal silhouette is unremarkable. No pneumothorax. Patient is mildly osteopenic. Mild approximate T9 pathologic fracture better characterized on prior CT.  IMPRESSION: No active cardiopulmonary disease.  RIGHT upper lobe mass correspond to patient's known primary lung neoplasm.   Electronically Signed   By: Elon Alas M.D.   On: 02/11/2015 04:05   Ct Biopsy  01/24/2015   CLINICAL DATA:  METASTATIC LUNG CANCER, ADDITIONAL BIOPSY REQUESTED for genetic/molecular testing  EXAM: CT GUIDED CORE BIOPSY OF RIGHT UPPER LOBE MASS  ANESTHESIA/SEDATION: 1.0  Mg IV Versed; 50 mcg IV Fentanyl  Total Moderate Sedation Time: 20 minutes.  PROCEDURE: The procedure risks, benefits, and alternatives were explained to the patient. Questions regarding the  procedure were encouraged and answered. The patient understands and consents to the procedure.  The POSTERIOR RIGHT UPPER BACK was prepped with CHLORAPREPin a sterile fashion, and a sterile drape was applied covering the operative field. A sterile gown and sterile gloves were used for the procedure. Local anesthesia was provided with 1% Lidocaine.  Previous imaging reviewed. Patient positioned right side down decubitus. Noncontrast localization CT performed. The right upper lobe mass was localized. Under sterile conditions and local anesthesia, a 17 gauge 6.8 cm access needle was advanced percutaneously from a posterior oblique approach into the lesion. Needle position confirmed  with CT. 3 18 gauge core biopsies obtained. Samples placed in formalin. Needle tract embolized with a [employer] plug.  Complications: None immediate  FINDINGS: CT imaging confirms needle placement in the right upper lobe mass for core biopsy  IMPRESSION: Successful CT-guided right upper lobe mass 18 gauge core biopsy   Electronically Signed   By: Jerilynn Mages.  Shick M.D.   On: 01/24/2015 10:19    Microbiology: Recent Results (from the past 240 hour(s))  Blood culture (routine x 2)     Status: None (Preliminary result)   Collection Time: 02/11/15  3:00 AM  Result Value Ref Range Status   Specimen Description BLOOD RIGHT HAND  Final   Special Requests BOTTLES DRAWN AEROBIC ONLY 3CC  Final   Culture   Final    NO GROWTH 3 DAYS Performed at [facility]    Report Status PENDING  Incomplete  Blood culture (routine x 2)     Status: None (Preliminary result)   Collection Time: 02/11/15  3:08 AM  Result Value Ref Range Status   Specimen Description BLOOD LEFT ANTECUBITAL  Final   Special Requests BOTTLES DRAWN AEROBIC AND ANAEROBIC 5CC  Final   Culture   Final    NO GROWTH 3 DAYS Performed at [facility]    Report Status PENDING  Incomplete  Urine culture     Status: None   Collection Time: 02/11/15  7:49 AM  Result Value Ref Range Status   Specimen Description URINE, RANDOM  Final   Special Requests NONE  Final   Culture   Final    NO GROWTH 2 DAYS Performed at [facility]    Report Status 02/13/2015 FINAL  Final  Clostridium Difficile by PCR (not at [facility])     Status: None   Collection Time: 02/13/15  2:37 PM  Result Value Ref Range Status   C difficile by pcr NEGATIVE NEGATIVE Final     Labs: Basic Metabolic Panel:  Recent Labs Lab 02/11/15 0308 02/12/15 1147 02/13/15 0513 02/14/15 0520 02/15/15 0520  NA 133* 136 133* 137 140  K 3.4* 3.0* 3.0* 2.6* 3.0*  CL 100* 108 107 106 108  CO2 23 23 20* 24 24  GLUCOSE 141* 100* 101* 93 84  BUN 31* 21* 18 21* 18   CREATININE 0.73 0.75 0.63 0.56 0.63  CALCIUM 7.8* 7.6* 7.7* 7.3* 7.6*  MG  --   --   --  1.7  --    Liver Function Tests:  Recent Labs Lab 02/11/15 0308  AST 22  ALT 21  ALKPHOS 56  BILITOT 1.3*  PROT 5.9*  ALBUMIN 3.0*   No results for input(s): LIPASE, AMYLASE in the last 168 hours. No results for input(s): AMMONIA in the last 168 hours. CBC:  Recent Labs Lab 02/11/15 0308 02/12/15 1147 02/13/15 0513 02/14/15 0520 02/15/15 0520  WBC 1.6* 1.2* 1.3* 1.6* 4.9  NEUTROABS 1.3*  --   --  1.1*  --   HGB 13.4 10.7* 10.5* 10.9* 10.5*  HCT 40.0 32.1* 30.7* 32.1* 31.0*  MCV 89.1 87.7 86.7 87.0 87.1  PLT 38* 35* 36* 38* 32*   Cardiac Enzymes: No results for input(s): CKTOTAL, CKMB, CKMBINDEX, TROPONINI in the last 168 hours. BNP: BNP (last 3 results) No results for input(s): BNP in the last 8760 hours.  ProBNP (last 3 results) No results for input(s): PROBNP in the last 8760 hours.  CBG: No results for input(s): GLUCAP in the last 168 hours.     SignedLouellen Molder  [facility] 02/15/2015, 10:03 AM

## 2015-02-16 ENCOUNTER — Ambulatory Visit: Payer: BC Managed Care – PPO

## 2015-02-16 ENCOUNTER — Telehealth: Payer: Self-pay | Admitting: Internal Medicine

## 2015-02-16 LAB — CULTURE, BLOOD (ROUTINE X 2)
CULTURE: NO GROWTH
Culture: NO GROWTH

## 2015-02-16 NOTE — Telephone Encounter (Signed)
returned call and vm full

## 2015-02-17 ENCOUNTER — Ambulatory Visit: Payer: BC Managed Care – PPO

## 2015-02-17 LAB — GI PATHOGEN PANEL BY PCR, STOOL

## 2015-02-18 NOTE — Addendum Note (Signed)
Encounter addended by: Kyung Rudd, MD on: 02/18/2015  1:42 PM<BR>     Documentation filed: Notes Section, Visit Diagnoses

## 2015-02-18 NOTE — Progress Notes (Addendum)
  Radiation Oncology         [phone] ________________________________  Name: ARTASIA THANG MRN: [id number]  Date: 01/18/2015  DOB: 09-Jan-1951  SIMULATION AND TREATMENT PLANNING NOTE  DIAGNOSIS:  Metastatic lung cancer  Site:   1. Multiple osseous metastases 2. Whole brain radiation treatment   NARRATIVE:  The patient was brought to the [facility].  Identity was confirmed.  All relevant records and images related to the planned course of therapy were reviewed.   Written consent to proceed with treatment was confirmed which was freely given after reviewing the details related to the planned course of therapy had been reviewed with the patient.  Then, the patient was set-up in a stable reproducible  supine position for radiation therapy.  CT images were obtained.  Surface markings were placed.    Medically necessary complex treatment device(s) for immobilization:   1. Customized vac lock bag 2. Customized thermoplastic head chest   The CT images were loaded into the planning software.  Then the target and avoidance structures were contoured.  Treatment planning then occurred.  The radiation prescription was entered and confirmed.  Due to the location and number of the desired bony targets corresponding to metastases, a helical 3-D conformal technique will be utilized on  Our tomotherapy unit. To treat the spine, a total of 11 complex treatment devices were fabricated which relate to the designed radiation treatment fields. The patient also will receive a course of whole brain radiation treatment concurrently, also a helical 3-D conformal technique. To treat the brain, a total of 7 complex treatment devices were fabricated related to the designed radiation treatment fields. Each of these customized fields/ complex treatment devices will be used on a daily basis during the radiation course. I have requested : 3D Simulation  I have requested a DVH of the following structures:  Target volume, bowel, kidneys bilaterally.   The patient will undergo daily image guidance to ensure accurate localization of the target, and adequate minimize dose to the normal surrounding structures in close proximity to the target.  PLAN:  The patient will receive 30 Gy in 10 fractions to each of the above target regions concurrently.  ________________________________   Jodelle Gross, MD, PhD

## 2015-02-20 ENCOUNTER — Encounter: Payer: Self-pay | Admitting: Physician Assistant

## 2015-02-20 ENCOUNTER — Telehealth: Payer: Self-pay

## 2015-02-20 ENCOUNTER — Other Ambulatory Visit (HOSPITAL_BASED_OUTPATIENT_CLINIC_OR_DEPARTMENT_OTHER): Payer: BC Managed Care – PPO

## 2015-02-20 ENCOUNTER — Ambulatory Visit (HOSPITAL_BASED_OUTPATIENT_CLINIC_OR_DEPARTMENT_OTHER): Payer: BC Managed Care – PPO | Admitting: Physician Assistant

## 2015-02-20 ENCOUNTER — Ambulatory Visit: Payer: BC Managed Care – PPO

## 2015-02-20 ENCOUNTER — Telehealth: Payer: Self-pay | Admitting: Internal Medicine

## 2015-02-20 ENCOUNTER — Encounter: Payer: Self-pay | Admitting: Internal Medicine

## 2015-02-20 VITALS — BP 104/69 | HR 95 | Temp 97.6°F | Resp 20 | Ht 67.0 in | Wt 138.2 lb

## 2015-02-20 DIAGNOSIS — C3411 Malignant neoplasm of upper lobe, right bronchus or lung: Secondary | ICD-10-CM

## 2015-02-20 DIAGNOSIS — C7931 Secondary malignant neoplasm of brain: Secondary | ICD-10-CM | POA: Diagnosis not present

## 2015-02-20 DIAGNOSIS — R197 Diarrhea, unspecified: Secondary | ICD-10-CM

## 2015-02-20 DIAGNOSIS — R5081 Fever presenting with conditions classified elsewhere: Secondary | ICD-10-CM

## 2015-02-20 DIAGNOSIS — E876 Hypokalemia: Secondary | ICD-10-CM

## 2015-02-20 DIAGNOSIS — C778 Secondary and unspecified malignant neoplasm of lymph nodes of multiple regions: Secondary | ICD-10-CM | POA: Diagnosis not present

## 2015-02-20 DIAGNOSIS — C3491 Malignant neoplasm of unspecified part of right bronchus or lung: Secondary | ICD-10-CM

## 2015-02-20 DIAGNOSIS — C7951 Secondary malignant neoplasm of bone: Secondary | ICD-10-CM | POA: Diagnosis not present

## 2015-02-20 DIAGNOSIS — D709 Neutropenia, unspecified: Secondary | ICD-10-CM

## 2015-02-20 LAB — CBC WITH DIFFERENTIAL/PLATELET
BASO%: 0.4 % (ref 0.0–2.0)
Basophils Absolute: 0 10*3/uL (ref 0.0–0.1)
EOS ABS: 0 10*3/uL (ref 0.0–0.5)
EOS%: 1 % (ref 0.0–7.0)
HCT: 33.3 % — ABNORMAL LOW (ref 34.8–46.6)
HGB: 11 g/dL — ABNORMAL LOW (ref 11.6–15.9)
LYMPH%: 7.8 % — ABNORMAL LOW (ref 14.0–49.7)
MCH: 29.1 pg (ref 25.1–34.0)
MCHC: 33.1 g/dL (ref 31.5–36.0)
MCV: 88 fL (ref 79.5–101.0)
MONO#: 0.2 10*3/uL (ref 0.1–0.9)
MONO%: 5.4 % (ref 0.0–14.0)
NEUT%: 85.4 % — ABNORMAL HIGH (ref 38.4–76.8)
NEUTROS ABS: 2.5 10*3/uL (ref 1.5–6.5)
PLATELETS: 59 10*3/uL — AB (ref 145–400)
RBC: 3.79 10*6/uL (ref 3.70–5.45)
RDW: 14.3 % (ref 11.2–14.5)
WBC: 3 10*3/uL — ABNORMAL LOW (ref 3.9–10.3)
lymph#: 0.2 10*3/uL — ABNORMAL LOW (ref 0.9–3.3)

## 2015-02-20 LAB — COMPREHENSIVE METABOLIC PANEL (CC13)
ALBUMIN: 2.8 g/dL — AB (ref 3.5–5.0)
ALK PHOS: 113 U/L (ref 40–150)
ALT: 65 U/L — ABNORMAL HIGH (ref 0–55)
ANION GAP: 7 meq/L (ref 3–11)
AST: 23 U/L (ref 5–34)
BUN: 22.7 mg/dL (ref 7.0–26.0)
CO2: 26 mEq/L (ref 22–29)
Calcium: 8.8 mg/dL (ref 8.4–10.4)
Chloride: 107 mEq/L (ref 98–109)
Creatinine: 0.7 mg/dL (ref 0.6–1.1)
EGFR: >90 mL/min/{1.73_m2} (ref >90)
Glucose: 105 mg/dl (ref 70–140)
Potassium: 4 mEq/L (ref 3.5–5.1)
Sodium: 140 mEq/L (ref 136–145)
TOTAL PROTEIN: 5.7 g/dL — AB (ref 6.4–8.3)
Total Bilirubin: 0.51 mg/dL (ref 0.20–1.20)

## 2015-02-20 MED ORDER — ONDANSETRON HCL 4 MG PO TABS: 4.0000 mg | ORAL_TABLET | Freq: Three times a day (TID) | ORAL | Status: DC | PRN

## 2015-02-20 NOTE — Progress Notes (Addendum)
[place] [phone]   [phone]  OFFICE PROGRESS NOTE  [name], MD [address] [place] [address]  DIAGNOSIS: Stage IV (T2a, N3, M1b) Non-small lung cancer, adenocarcinoma with positive EGFR mutation with deletion in exon 19, presented with large right upper lobe lung mass in addition to bilateral mediastinal and left supraclavicular lymphadenopathy as well as multiple bone and brain lesions diagnosed in June 2016  PRIOR THERAPY: Whole brain irradiation under the care of Dr. Lisbeth Renshaw  CURRENT THERAPY: Gilotrif 40 mg by mouth daily. Initially started 02/09/2015 for 2 doses and then held secondary to admission for thrombocytopenia and neutropenia.  INTERVAL HISTORY: Laura Walter 64 y.o. female returns to the clinic today for follow-up visit accompanied by her partner. and 2 friends. She was admitted from 02/11/2015 through 02/15/2015 with diarrhea, neutropenia and thrombocytopenia. She status post whole brain radiation under the care of Dr. Lisbeth Renshaw for multiple brain metastatic lesions. She requests a refill for her Zofran. The Zofran ODT is not covered by her insurance plan but the Zofran tablets are covered. We are also awaiting a letter of clearance from her dentist before she is started on Xgeva therapy. She is feeling better except for fatigue. She denied having any significant chest pain, shortness of breath, cough or hemoptysis. She continues to have mild back pain. She denied having any significant nausea or vomiting, no fever or chills. She is here today for re- evaluation and discussion of resuming her treatment with Gilotrif.  MEDICAL HISTORY: Past Medical History  Diagnosis Date  . Pneumonia 2009 & 2011  . Allergy     seasonal  . H/O reactive hypoglycemia   . Osteopenia   . IBS (irritable bowel syndrome)   . Brain cancer 01/13/15    MRI multiple small brain mets 10 individual lesions  . Bone cancer 01/10/15 PET    lytic lesion lt 1st rib,T9,L3 Lt acetabulum  . Bronchitis     ALLERGIES:  is allergic to sulfonamide derivatives.  MEDICATIONS:  Current Outpatient Prescriptions  Medication Sig Dispense Refill  . afatinib dimaleate (GILOTRIF) 40 MG tablet Take 1 tablet (40 mg total) by mouth daily. Take on an empty stomach 1hr before or 2 hrs after meals. 30 tablet 2  . dexamethasone (DECADRON) 4 MG tablet Take 1 tablet (4 mg total) by mouth 2 (two) times daily. (Patient taking differently: Take 2 mg by mouth 2 (two) times daily. ) 40 tablet 0  . Dextromethorphan HBr 15 MG/5ML LIQD Take 5 mLs by mouth every 5 (five) hours as needed (cough).    Marland Kitchen emollient (BIAFINE) cream Apply 1 application topically daily.    . feeding supplement, ENSURE ENLIVE, (ENSURE ENLIVE) LIQD Take 237 mLs by mouth 2 (two) times daily between meals. 237 mL 12  . HYDROcodone-homatropine (HYCODAN) 5-1.5 MG/5ML syrup Take 5 mLs by mouth every 6 (six) hours as needed for cough. 120 mL 0  . ibuprofen (ADVIL,MOTRIN) 200 MG tablet Take 200 mg by mouth every 6 (six) hours as needed for moderate pain.    . Misc. Devices (CANE) MISC by Does not apply route.    . ondansetron (ZOFRAN-ODT) 4 MG disintegrating tablet Take 1 tablet (4 mg total) by mouth every 8 (eight) hours as needed for nausea or vomiting. 30 tablet 0  . potassium chloride SA (K-DUR,KLOR-CON) 20 MEQ tablet Take 2 tablets (40 mEq total) by mouth daily. 7 tablet 0  . ondansetron (ZOFRAN) 4  MG tablet Take 1 tablet (4 mg total) by mouth every 8 (eight) hours as needed for nausea or vomiting. 20 tablet 3   No current facility-administered medications for this visit.    SURGICAL HISTORY:  Past Surgical History  Procedure Laterality Date  . Colonoscopy  2009    Negative; [facility]  . Tonsillectomy and adenoidectomy    . Wisdom tooth extraction      REVIEW OF SYSTEMS:  Constitutional: positive for fatigue Eyes: negative Ears, nose, mouth, throat, and face:  negative Respiratory: negative Cardiovascular: negative Gastrointestinal: positive for diarrhea Genitourinary:negative Integument/breast: negative Hematologic/lymphatic: negative Musculoskeletal:positive for back pain Neurological: negative Behavioral/Psych: negative Endocrine: negative Allergic/Immunologic: negative   PHYSICAL EXAMINATION: General appearance: alert, cooperative, flushed and no distress Head: Normocephalic, without obvious abnormality, atraumatic Neck: no adenopathy, no JVD, supple, symmetrical, trachea midline and thyroid not enlarged, symmetric, no tenderness/mass/nodules Lymph nodes: Cervical, supraclavicular, and axillary nodes normal. Resp: clear to auscultation bilaterally Back: Tenderness to palpation at the lower thoracic and lumbar vertebrae. Cardio: regular rate and rhythm, S1, S2 normal, no murmur, click, rub or gallop GI: soft, non-tender; bowel sounds normal; no masses,  no organomegaly Extremities: extremities normal, atraumatic, no cyanosis or edema Neurologic: Alert and oriented X 3, normal strength and tone. Normal symmetric reflexes. Normal coordination and gait Skin: Area of ecchymosis in the left antecubital fossa related to recent IV from her hospitalization. No active bleeding.  ECOG PERFORMANCE STATUS: 1 - Symptomatic but completely ambulatory  Blood pressure 104/69, pulse 95, temperature 97.6 F (36.4 C), temperature source Oral, resp. rate 20, height 5' 7"  (1.702 m), weight 138 lb 3.2 oz (62.687 kg), SpO2 100 %.  LABORATORY DATA: Lab Results  Component Value Date   WBC 3.0* 02/20/2015   HGB 11.0* 02/20/2015   HCT 33.3* 02/20/2015   MCV 88.0 02/20/2015   PLT 59* 02/20/2015      Chemistry      Component Value Date/Time   NA 140 02/20/2015 1124   NA 140 02/15/2015 0520   K 4.0 02/20/2015 1124   K 3.0* 02/15/2015 0520   CL 108 02/15/2015 0520   CO2 26 02/20/2015 1124   CO2 24 02/15/2015 0520   BUN 22.7 02/20/2015 1124   BUN 18  02/15/2015 0520   CREATININE 0.7 02/20/2015 1124   CREATININE 0.63 02/15/2015 0520   CREATININE 0.83 11/02/2013 0832      Component Value Date/Time   CALCIUM 8.8 02/20/2015 1124   CALCIUM 7.6* 02/15/2015 0520   ALKPHOS 113 02/20/2015 1124   ALKPHOS 56 02/11/2015 0308   AST 23 02/20/2015 1124   AST 22 02/11/2015 0308   ALT 65* 02/20/2015 1124   ALT 21 02/11/2015 0308   BILITOT 0.51 02/20/2015 1124   BILITOT 1.3* 02/11/2015 0308       RADIOGRAPHIC STUDIES: Dg Chest 1 View  01/24/2015   CLINICAL DATA:  Status post right lung biopsy  EXAM: CHEST  1 VIEW  COMPARISON:  01/05/2015  FINDINGS: Right upper lobe mass lesion is again identified similar to that seen on the prior exam. No post biopsy pneumothorax is noted. Left lung remains clear. Cardiac shadow is stable.  IMPRESSION: No evidence of post biopsy pneumothorax on the right.   Electronically Signed   By: Inez Catalina M.D.   On: 01/24/2015 11:16   Dg Chest 2 View  02/11/2015   CLINICAL DATA:  Febrile, cough and shortness of breath. History of metastatic RIGHT lung cancer. Status post radiation 3 days ago.  EXAM:  CHEST  2 VIEW  COMPARISON:  Chest radiograph January 24, 2015 and CT chest January 05, 2015  FINDINGS: RIGHT upper lobe mass, similar to prior chest radiograph. No pleural effusion. Cardiomediastinal silhouette is unremarkable. No pneumothorax. Patient is mildly osteopenic. Mild approximate T9 pathologic fracture better characterized on prior CT.  IMPRESSION: No active cardiopulmonary disease.  RIGHT upper lobe mass correspond to patient's known primary lung neoplasm.   Electronically Signed   By: Elon Alas M.D.   On: 02/11/2015 04:05   Ct Biopsy  01/24/2015   CLINICAL DATA:  METASTATIC LUNG CANCER, ADDITIONAL BIOPSY REQUESTED for genetic/molecular testing  EXAM: CT GUIDED CORE BIOPSY OF RIGHT UPPER LOBE MASS  ANESTHESIA/SEDATION: 1.0  Mg IV Versed; 50 mcg IV Fentanyl  Total Moderate Sedation Time: 20 minutes.  PROCEDURE: The  procedure risks, benefits, and alternatives were explained to the patient. Questions regarding the procedure were encouraged and answered. The patient understands and consents to the procedure.  The POSTERIOR RIGHT UPPER BACK was prepped with CHLORAPREPin a sterile fashion, and a sterile drape was applied covering the operative field. A sterile gown and sterile gloves were used for the procedure. Local anesthesia was provided with 1% Lidocaine.  Previous imaging reviewed. Patient positioned right side down decubitus. Noncontrast localization CT performed. The right upper lobe mass was localized. Under sterile conditions and local anesthesia, a 17 gauge 6.8 cm access needle was advanced percutaneously from a posterior oblique approach into the lesion. Needle position confirmed with CT. 3 18 gauge core biopsies obtained. Samples placed in formalin. Needle tract embolized with a [employer] plug.  Complications: None immediate  FINDINGS: CT imaging confirms needle placement in the right upper lobe mass for core biopsy  IMPRESSION: Successful CT-guided right upper lobe mass 18 gauge core biopsy   Electronically Signed   By: Jerilynn Mages.  Shick M.D.   On: 01/24/2015 10:19    ASSESSMENT AND PLAN: This is a very pleasant 64 years old never smoker white female recently diagnosed with stage IV non-small cell lung cancer, adenocarcinoma with positive EGFR mutation with deletion in exon 19 presented with large right upper lobe lung mass in addition to bilateral mediastinal, supraclavicular lymphadenopathy as well as bone and multiple brain metastasis diagnosed in June 2016.  The patient is status post whole brain irradiation under the care of Dr. Lisbeth Renshaw. The patient was discussed with and also seen by Dr. Julien Nordmann. Her platelet count has him improved but still remains low at 59,000. Her total white count was 3.0 with an ANC of 2.5. She is to resume the Gilotrif 40 mg by mouth daily beginning 02/21/2015. We will obtain repeat labs in 1  week and have her return in 2 weeks for a symptom management visit again with repeat labs. She was advised to start taking 2 Imodium tablets every morning and then one tablet after each loose stool to a maximum of 8 tablets per day if needed. Both she and her partner voiced understanding. Prescription for Zofran tablets was sent to her pharmacy of record via E scribe. We are still considering the patient for treatment with Xgeva once we have received written dental clearance from her dentist. She was also advised to take calcium and vitamin D supplements. She is to complete her dexamethasone taper as previously planned.  She was advised to call immediately if she has any concerning symptoms in the interval. The patient voices understanding of current disease status and treatment options and is in agreement with the current care plan.  All questions were answered. The patient knows to call the clinic with any problems, questions or concerns. We can certainly see the patient much sooner if necessary.  Carlton Adam, PA-C 02/20/2015   ADDENDUM: Hematology/Oncology Attending: I had a face to face encounter with the patient today. I recommended her care plan. This is a very pleasant 64 years old white female with recently diagnosed stage IV non-small cell lung cancer, adenocarcinoma with positive EGFR mutation with deletion in 80. She was recently admitted to [facility] with sepsis and pancytopenia of unclear etiology. The patient is feeling much better now and her platelets count are recovering slowly.  I recommended for her to resume her treatment with Gilotrif 40 mg by mouth daily. We will continue to monitor her CBC and comprehensive metabolic panel closely. She was advised to take Imodium for diarrhea and to call immediately if she develop any significant skin rash. The patient would come back for follow-up visit in 2 weeks for reevaluation. The patient was advised to call  immediately if she has any concerning symptoms in the interval.  Disclaimer: This note was dictated with voice recognition software. Similar sounding words can inadvertently be transcribed and may be missed upon review. Eilleen Kempf., MD 02/20/2015

## 2015-02-20 NOTE — Patient Instructions (Signed)
Resume your Gilotrif 40 mg by mouth daily starting 02/21/2015 Take Imodium 2 tablets by mouth daily and then one tablet after each loose bowel movement to a maximum of 8 tablets daily if needed Return in one week for repeat labs and in 2 weeks for reevaluation and repeat labs

## 2015-02-20 NOTE — Telephone Encounter (Signed)
Gave adn printed appt sched and avs fo rpt for July and Aug

## 2015-02-20 NOTE — Telephone Encounter (Signed)
Called pt pharmacy- [facility] to inquire if pt had picked up Zofran-ODT rx that was prescribed 7/13. Was informed pt did not pick up prescription and that prescription is not covered by insurance- total cost would by 161.99. Informed pt who states she would rather go back to the pill form. Santiago Glad from [facility] states she had a previous Zofran she picked up 02/07/15 20 tablets with no refills that is covered by insurance. Informed Awilda Metro, PA. New rx sent to pharmacy for Zofran

## 2015-02-20 NOTE — Progress Notes (Signed)
[facility], [id number], approved ondansetron '4mg'$  from 01/21/15-02/20/16 ref # [id number]

## 2015-02-20 NOTE — Telephone Encounter (Signed)
Called pt dental office, regarding a letter for Xgeva treatments. Spoke with [employer] who states pt does have a pending procedure for a cavity but pt stated this morning she did not want to worry about that right now. Informed Amber to have Dr.Russell document that in his letter and state if she has anything else that was going on that could affect starting the injection. Amber states she will have Dr. Joneen Caraway write the letter this afternoon and fax it over.

## 2015-02-21 ENCOUNTER — Ambulatory Visit: Payer: BC Managed Care – PPO

## 2015-02-21 ENCOUNTER — Telehealth: Payer: Self-pay | Admitting: Medical Oncology

## 2015-02-22 ENCOUNTER — Ambulatory Visit: Payer: BC Managed Care – PPO

## 2015-02-24 ENCOUNTER — Telehealth: Payer: Self-pay | Admitting: *Deleted

## 2015-02-24 NOTE — Telephone Encounter (Signed)
COULD PT.TAKE THE ZOFRAN EVERY MORNING AT THE SAME TIME SHE TAKES THE IMODIUM BEFORE TAKING HER "CHEMO PILL"? INFORMED [name] PEARSON IT IS OK FOR PT. TO TAKE THE ZOFRAN AT THE SAME TIME SHE TAKES THE IMODIUM BEFORE TAKING HER "CHEMO PILL". INSTRUCTED [name] TO ENCOURAGE PT. TO DRINK 64 OUNCES OF FLUID IN A 24 HOUR PERIOD. ALSO PT. SHOULD EAT SMALL FREQUENT MEALS. [name] VOICES UNDERSTANDING.

## 2015-02-27 ENCOUNTER — Other Ambulatory Visit (HOSPITAL_BASED_OUTPATIENT_CLINIC_OR_DEPARTMENT_OTHER): Payer: BC Managed Care – PPO

## 2015-02-27 DIAGNOSIS — C3411 Malignant neoplasm of upper lobe, right bronchus or lung: Secondary | ICD-10-CM

## 2015-02-27 DIAGNOSIS — E876 Hypokalemia: Secondary | ICD-10-CM

## 2015-02-27 DIAGNOSIS — C3491 Malignant neoplasm of unspecified part of right bronchus or lung: Secondary | ICD-10-CM

## 2015-02-27 LAB — CBC WITH DIFFERENTIAL/PLATELET
BASO%: 0 % (ref 0.0–2.0)
BASOS ABS: 0 10*3/uL (ref 0.0–0.1)
EOS%: 4.5 % (ref 0.0–7.0)
Eosinophils Absolute: 0.1 10*3/uL (ref 0.0–0.5)
HCT: 30 % — ABNORMAL LOW (ref 34.8–46.6)
HEMOGLOBIN: 10.1 g/dL — AB (ref 11.6–15.9)
LYMPH%: 9.4 % — AB (ref 14.0–49.7)
MCH: 30 pg (ref 25.1–34.0)
MCHC: 33.7 g/dL (ref 31.5–36.0)
MCV: 89 fL (ref 79.5–101.0)
MONO#: 0.3 10*3/uL (ref 0.1–0.9)
MONO%: 11.6 % (ref 0.0–14.0)
NEUT%: 74.5 % (ref 38.4–76.8)
NEUTROS ABS: 2 10*3/uL (ref 1.5–6.5)
Platelets: 98 10*3/uL — ABNORMAL LOW (ref 145–400)
RBC: 3.37 10*6/uL — AB (ref 3.70–5.45)
RDW: 16.2 % — ABNORMAL HIGH (ref 11.2–14.5)
WBC: 2.7 10*3/uL — ABNORMAL LOW (ref 3.9–10.3)
lymph#: 0.3 10*3/uL — ABNORMAL LOW (ref 0.9–3.3)

## 2015-02-27 LAB — COMPREHENSIVE METABOLIC PANEL (CC13)
ALK PHOS: 97 U/L (ref 40–150)
ALT: 21 U/L (ref 0–55)
AST: 13 U/L (ref 5–34)
Albumin: 2.6 g/dL — ABNORMAL LOW (ref 3.5–5.0)
Anion Gap: 9 mEq/L (ref 3–11)
BILIRUBIN TOTAL: 0.72 mg/dL (ref 0.20–1.20)
BUN: 11.3 mg/dL (ref 7.0–26.0)
CO2: 25 mEq/L (ref 22–29)
CREATININE: 0.7 mg/dL (ref 0.6–1.1)
Calcium: 8.4 mg/dL (ref 8.4–10.4)
Chloride: 103 mEq/L (ref 98–109)
EGFR: 90 mL/min/{1.73_m2} — AB (ref >90)
GLUCOSE: 108 mg/dL (ref 70–140)
POTASSIUM: 3.7 meq/L (ref 3.5–5.1)
Sodium: 138 mEq/L (ref 136–145)
Total Protein: 5.9 g/dL — ABNORMAL LOW (ref 6.4–8.3)

## 2015-03-03 ENCOUNTER — Telehealth: Payer: Self-pay | Admitting: *Deleted

## 2015-03-03 NOTE — Telephone Encounter (Signed)
VERBAL ORDER AND READ BACK TO ADRENA JOHNSON,PA- PT. TO TRY SALINE NASAL SPRAY AND NEOSPORIN OINTMENT. WILL SEE PT. ON Monday. NOTIFIED PT. OF THE ABOVE INSTRUCTIONS. SHE VOICES UNDERSTANDING.

## 2015-03-06 ENCOUNTER — Other Ambulatory Visit (HOSPITAL_BASED_OUTPATIENT_CLINIC_OR_DEPARTMENT_OTHER): Payer: BC Managed Care – PPO

## 2015-03-06 ENCOUNTER — Ambulatory Visit (HOSPITAL_BASED_OUTPATIENT_CLINIC_OR_DEPARTMENT_OTHER): Payer: BC Managed Care – PPO | Admitting: Physician Assistant

## 2015-03-06 ENCOUNTER — Encounter: Payer: Self-pay | Admitting: *Deleted

## 2015-03-06 ENCOUNTER — Telehealth: Payer: Self-pay | Admitting: Physician Assistant

## 2015-03-06 ENCOUNTER — Encounter: Payer: Self-pay | Admitting: Physician Assistant

## 2015-03-06 VITALS — BP 111/66 | HR 103 | Temp 97.7°F | Resp 18 | Ht 67.0 in | Wt 134.2 lb

## 2015-03-06 DIAGNOSIS — R1310 Dysphagia, unspecified: Secondary | ICD-10-CM | POA: Diagnosis not present

## 2015-03-06 DIAGNOSIS — C3411 Malignant neoplasm of upper lobe, right bronchus or lung: Secondary | ICD-10-CM

## 2015-03-06 DIAGNOSIS — R5383 Other fatigue: Secondary | ICD-10-CM

## 2015-03-06 DIAGNOSIS — C7951 Secondary malignant neoplasm of bone: Secondary | ICD-10-CM

## 2015-03-06 DIAGNOSIS — Z5111 Encounter for antineoplastic chemotherapy: Secondary | ICD-10-CM

## 2015-03-06 DIAGNOSIS — C3491 Malignant neoplasm of unspecified part of right bronchus or lung: Secondary | ICD-10-CM

## 2015-03-06 DIAGNOSIS — E876 Hypokalemia: Secondary | ICD-10-CM

## 2015-03-06 LAB — CBC WITH DIFFERENTIAL/PLATELET
BASO%: 0.3 % (ref 0.0–2.0)
Basophils Absolute: 0 10*3/uL (ref 0.0–0.1)
EOS ABS: 0.2 10*3/uL (ref 0.0–0.5)
EOS%: 3.9 % (ref 0.0–7.0)
HEMATOCRIT: 28.9 % — AB (ref 34.8–46.6)
HEMOGLOBIN: 10 g/dL — AB (ref 11.6–15.9)
LYMPH#: 0.3 10*3/uL — AB (ref 0.9–3.3)
LYMPH%: 4.8 % — AB (ref 14.0–49.7)
MCH: 30.8 pg (ref 25.1–34.0)
MCHC: 34.8 g/dL (ref 31.5–36.0)
MCV: 88.6 fL (ref 79.5–101.0)
MONO#: 0.6 10*3/uL (ref 0.1–0.9)
MONO%: 10 % (ref 0.0–14.0)
NEUT%: 81 % — AB (ref 38.4–76.8)
NEUTROS ABS: 5.1 10*3/uL (ref 1.5–6.5)
PLATELETS: 188 10*3/uL (ref 145–400)
RBC: 3.26 10*6/uL — ABNORMAL LOW (ref 3.70–5.45)
RDW: 18.6 % — ABNORMAL HIGH (ref 11.2–14.5)
WBC: 6.3 10*3/uL (ref 3.9–10.3)

## 2015-03-06 LAB — COMPREHENSIVE METABOLIC PANEL (CC13)
ALT: 24 U/L (ref 0–55)
AST: 21 U/L (ref 5–34)
Albumin: 2.6 g/dL — ABNORMAL LOW (ref 3.5–5.0)
Alkaline Phosphatase: 80 U/L (ref 40–150)
Anion Gap: 9 mEq/L (ref 3–11)
BILIRUBIN TOTAL: 0.69 mg/dL (ref 0.20–1.20)
BUN: 16.5 mg/dL (ref 7.0–26.0)
CO2: 26 mEq/L (ref 22–29)
Calcium: 8.6 mg/dL (ref 8.4–10.4)
Chloride: 104 mEq/L (ref 98–109)
Creatinine: 0.7 mg/dL (ref 0.6–1.1)
EGFR: 88 mL/min/{1.73_m2} — ABNORMAL LOW (ref >90)
GLUCOSE: 120 mg/dL (ref 70–140)
Potassium: 3.1 mEq/L — ABNORMAL LOW (ref 3.5–5.1)
Sodium: 139 mEq/L (ref 136–145)
Total Protein: 6.2 g/dL — ABNORMAL LOW (ref 6.4–8.3)

## 2015-03-06 NOTE — Progress Notes (Signed)
Completed caregiver leave letter per patient request.  I notified [name], caregiver that letter is completed and ready for pick up.

## 2015-03-06 NOTE — Telephone Encounter (Signed)
Appointments made and avs printed for patient,email to dr Julien Nordmann to advise on an appointment for week of 8/15

## 2015-03-06 NOTE — Progress Notes (Addendum)
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 Lisbeth Renshaw  CURRENT THERAPY: Gilotrif 40 mg by mouth daily. Initially started 02/09/2015 for 2 doses and then held secondary to admission for thrombocytopenia and neutropenia.  INTERVAL HISTORY: Laura Walter 64 y.o. female returns to the clinic today for follow-up visit accompanied by her partner. and a friend. She was admitted from 02/11/2015 through 02/15/2015 with diarrhea, neutropenia and thrombocytopenia. She status post whole brain radiation under the care of Dr. Lisbeth Renshaw for multiple brain metastatic lesions. She also received radiation therapy to the spine for total of 11 fractions, completed approximately 02/20/2015.Marland Kitchen She complains of significant fatigue. She completed her steroid-dependent taper as outlined at least 2 weeks ago. She is tolerating the resumed therapy with  Gilotrif 40 mg by mouth daily recently well. She reports 1 episode of looser than usual bowel movement daily, not requiring treatment with Imodium. She developed some lesions on her nasal membranes and has been using Neosporin with some relief. She continues to have difficulty swallowing primarily solid foods. She is previously been seen by Dr. Jenny Reichmann per week, gastroenterologist, when she had a colonoscopy. requests a refill for her Zofran. The Zofran ODT is not covered by her insurance plan but the Zofran tablets are covered. We are also awaiting a letter of clearance from her dentist  before she is started on Xgeva therapy. She is feeling better except for fatigue. She denied having any significant chest pain, shortness of breath, cough or hemoptysis. She continues to have mild back pain. She denied having any significant nausea or vomiting, no fever or chills. She is here today for re- evaluation and discussion of resuming her treatment with Gilotrif.  MEDICAL HISTORY: Past Medical History  Diagnosis Date  . Pneumonia 2009 & 2011  . Allergy     seasonal  . H/O reactive hypoglycemia   . Osteopenia   . IBS (irritable bowel syndrome)   . Brain cancer 01/13/15    MRI multiple small brain mets 10 individual lesions  . Bone cancer 01/10/15 PET    lytic lesion lt 1st rib,T9,L3 Lt acetabulum  . Bronchitis     ALLERGIES:  is allergic to sulfonamide derivatives.  MEDICATIONS:  Current Outpatient Prescriptions  Medication Sig Dispense Refill  . afatinib dimaleate (GILOTRIF) 40 MG tablet Take 1 tablet (40 mg total) by mouth daily. Take on an empty stomach 1hr before or 2 hrs after meals. 30 tablet 2  . emollient (BIAFINE) cream Apply 1 application topically daily.    Marland Kitchen HYDROcodone-homatropine (HYCODAN) 5-1.5 MG/5ML syrup Take 5 mLs by mouth every 6 (six) hours as needed for cough. 120 mL 0  . Misc. Devices (CANE) MISC by Does not apply route.    . ondansetron (ZOFRAN) 4 MG tablet Take 1 tablet (4 mg total) by mouth every 8 (eight) hours as needed for nausea or vomiting. 20 tablet 3  . Dextromethorphan HBr 15 MG/5ML LIQD Take 5 mLs by mouth every 5 (five) hours as needed (cough).    Marland Kitchen  feeding supplement, ENSURE ENLIVE, (ENSURE ENLIVE) LIQD Take 237 mLs by mouth 2 (two) times daily between meals. (Patient not taking: Reported on 03/06/2015) 237 mL 12  . ibuprofen (ADVIL,MOTRIN) 200 MG tablet Take 200 mg by mouth every 6 (six) hours as needed for moderate pain.    Marland Kitchen ondansetron (ZOFRAN-ODT) 4 MG disintegrating tablet Take 1 tablet (4 mg total) by mouth every 8 (eight) hours as needed  for nausea or vomiting. (Patient not taking: Reported on 03/06/2015) 30 tablet 0  . potassium chloride SA (K-DUR,KLOR-CON) 20 MEQ tablet Take 2 tablets (40 mEq total) by mouth daily. (Patient not taking: Reported on 03/06/2015) 7 tablet 0   No current facility-administered medications for this visit.    SURGICAL HISTORY:  Past Surgical History  Procedure Laterality Date  . Colonoscopy  2009    Negative; [facility]  . Tonsillectomy and adenoidectomy    . Wisdom tooth extraction      REVIEW OF SYSTEMS:  Constitutional: positive for fatigue Eyes: negative Ears, nose, mouth, throat, and face: negative Respiratory: negative Cardiovascular: negative Gastrointestinal: positive for diarrhea Genitourinary:negative Integument/breast: negative Hematologic/lymphatic: negative Musculoskeletal:positive for back pain Neurological: negative Behavioral/Psych: negative Endocrine: negative Allergic/Immunologic: negative   PHYSICAL EXAMINATION: General appearance: alert, cooperative, flushed and no distress Head: Normocephalic, without obvious abnormality, atraumatic Neck: no adenopathy, no JVD, supple, symmetrical, trachea midline and thyroid not enlarged, symmetric, no tenderness/mass/nodules Lymph nodes: Cervical, supraclavicular, and axillary nodes normal. Resp: clear to auscultation bilaterally Back: Tenderness to palpation at the lower thoracic and lumbar vertebrae. Cardio: regular rate and rhythm, S1, S2 normal, no murmur, click, rub or gallop GI: soft, non-tender; bowel sounds normal; no masses,  no organomegaly Extremities: extremities normal, atraumatic, no cyanosis or edema Neurologic: Alert and oriented X 3, normal strength and tone. Normal symmetric reflexes. Normal coordination and gait Skin: No active acneiform eruptions noted on the face, chest or upper back. Also no active lesions within the nares  ECOG PERFORMANCE STATUS: 1 - Symptomatic but completely ambulatory  Blood pressure  111/66, pulse 103, temperature 97.7 F (36.5 C), temperature source Oral, resp. rate 18, height _0  (1.702 m), weight 134 lb 3.2 oz (60.873 kg), SpO2 98 %.  LABORATORY DATA: Lab Results  Component Value Date   WBC 6.3 03/06/2015   HGB 10.0* 03/06/2015   HCT 28.9* 03/06/2015   MCV 88.6 03/06/2015   PLT 188 03/06/2015      Chemistry      Component Value Date/Time   NA 139 03/06/2015 1128   NA 140 02/15/2015 0520   K 3.1* 03/06/2015 1128   K 3.0* 02/15/2015 0520   CL 108 02/15/2015 0520   CO2 26 03/06/2015 1128   CO2 24 02/15/2015 0520   BUN 16.5 03/06/2015 1128   BUN 18 02/15/2015 0520   CREATININE 0.7 03/06/2015 1128   CREATININE 0.63 02/15/2015 0520   CREATININE 0.83 11/02/2013 0832      Component Value Date/Time   CALCIUM 8.6 03/06/2015 1128   CALCIUM 7.6* 02/15/2015 0520   ALKPHOS 80 03/06/2015 1128   ALKPHOS 56 02/11/2015 0308   AST 21 03/06/2015 1128   AST 22 02/11/2015 0308   ALT 24 03/06/2015 1128   ALT 21 02/11/2015 0308   BILITOT 0.69 03/06/2015 1128   BILITOT 1.3* 02/11/2015 0308       RADIOGRAPHIC STUDIES: Dg Chest 2 View  02/11/2015   CLINICAL DATA:  Febrile, cough and shortness of breath. History of metastatic RIGHT lung cancer. Status post radiation 3 days  ago.  EXAM: CHEST  2 VIEW  COMPARISON:  Chest radiograph January 24, 2015 and CT chest January 05, 2015  FINDINGS: RIGHT upper lobe mass, similar to prior chest radiograph. No pleural effusion. Cardiomediastinal silhouette is unremarkable. No pneumothorax. Patient is mildly osteopenic. Mild approximate T9 pathologic fracture better characterized on prior CT.  IMPRESSION: No active cardiopulmonary disease.  RIGHT upper lobe mass correspond to patient's known primary lung neoplasm.   Electronically Signed   By: Elon Alas M.D.   On: 02/11/2015 04:05    ASSESSMENT AND PLAN: This is a very pleasant 64 years old never smoker white female recently diagnosed with stage IV non-small cell lung cancer,  adenocarcinoma with positive EGFR mutation with deletion in exon 19 presented with large right upper lobe lung mass in addition to bilateral mediastinal, supraclavicular lymphadenopathy as well as bone and multiple brain metastasis diagnosed in June 2016.  The patient is status post whole brain irradiation under the care of Dr. Lisbeth Renshaw as well as some radiation therapy to the spine. The patient was discussed with and also seen by Dr. Julien Nordmann. She is to continue on the Gilotrif 40 mg by mouth daily. We will have her follow-up in another 2 weeks for reevaluation. Her chemistry studies revealed low potassium at 3.1 and a prescription for potassium chloride 21) mouth daily for the [facility] scribe. She is to continue to monitor her her difficulty swallowing and make sure that she let us radiation therapy note the symptoms as well as potentially follow-up with Dr. Scarlette Shorts for further evaluation and management. In addition to the Neosporin she may also use nasal saline to keep her membranes moist. Chills reported some dryness in her corners of both eyes. She was advised to use over-the-counter moisturizing eyedrops.  We are still considering the patient for treatment with Xgeva once we have received written dental clearance from her dentist. She was also advised to take calcium and vitamin D supplements.  She was advised to call immediately if she has any concerning symptoms in the interval. The patient voices understanding of current disease status and treatment options and is in agreement with the current care plan.  All questions were answered. The patient knows to call the clinic with any problems, questions or concerns. We can certainly see the patient much sooner if necessary.  Carlton Adam, PA-C 03/06/2015  ADDENDUM: Hematology/Oncology Attending: I had a face to face encounter with the patient. I recommended her care plan. This is a very pleasant 64 years old  white female with metastatic non-small cell lung cancer, adenocarcinoma with positive EGFR mutation with deletion in exon 19. The patient is currently on treatment with Gilotrif status post 2 weeks. She is tolerating her treatment fairly well with no significant skin rash except for some soreness in the nostrils and dry skin. She denied having any significant diarrhea. The patient continues to complain of dysphagia most likely secondary to her disease. She will consult with her gastroenterologist Dr. Henrene Pastor for evaluation. I recommended for the patient to continue her current treatment with Gilotrif. She would come back for follow-up visit in 2 weeks for reevaluation and management of any adverse effect of her treatment. For the metastatic bone disease, the patient will continue on Xgeva. She was advised to call immediately if she has any concerning symptoms in the interval.  Disclaimer: This note was dictated with voice recognition software. Similar sounding words can inadvertently be transcribed and  may be missed upon review. Eilleen Kempf., MD 03/06/2015

## 2015-03-06 NOTE — Progress Notes (Signed)
Oncology Nurse Navigator Documentation  Oncology Nurse Navigator Flowsheets 03/06/2015  Navigator Encounter Type 3 month  Patient Visit Type Follow-up/I spoke with Laura Walter and partner today at [facility].  They are in need of medical leave letter to work.  I will complete and get it okay with Dr. Julien Nordmann.    Treatment Phase Treatment  Barriers/Navigation Needs Education  Interventions Other  Time Spent with Patient 30

## 2015-03-06 NOTE — Patient Instructions (Signed)
Continue Gilotrif 40 mg by mouth daily Follow up in 2 weeks 

## 2015-03-07 ENCOUNTER — Encounter: Payer: Self-pay | Admitting: Radiation Oncology

## 2015-03-07 ENCOUNTER — Telehealth: Payer: Self-pay | Admitting: Physician Assistant

## 2015-03-07 NOTE — Telephone Encounter (Signed)
Called patient and she is aware of her 8/15 appointment with Burnetta Sabin which is ok with dr Julien Nordmann per staff message  Webb Silversmith

## 2015-03-08 ENCOUNTER — Encounter: Payer: Self-pay | Admitting: Radiation Oncology

## 2015-03-08 ENCOUNTER — Ambulatory Visit
Admission: RE | Admit: 2015-03-08 | Discharge: 2015-03-08 | Disposition: A | Discharge status: Home / Self Care | Payer: BC Managed Care – PPO | Source: Ambulatory Visit | Attending: Radiation Oncology | Admitting: Radiation Oncology

## 2015-03-08 ENCOUNTER — Telehealth: Payer: Self-pay | Admitting: *Deleted

## 2015-03-08 ENCOUNTER — Other Ambulatory Visit: Payer: Self-pay | Admitting: Internal Medicine

## 2015-03-08 VITALS — BP 119/96 | HR 97 | Temp 97.9°F | Resp 20 | Ht 67.0 in | Wt 133.7 lb

## 2015-03-08 DIAGNOSIS — C7951 Secondary malignant neoplasm of bone: Secondary | ICD-10-CM

## 2015-03-08 HISTORY — DX: Personal history of irradiation: Z92.3

## 2015-03-08 MED ORDER — MAGIC MOUTHWASH: 5.0000 mL | Freq: Four times a day (QID) | ORAL | Status: DC | PRN

## 2015-03-08 NOTE — Progress Notes (Addendum)
Follow up s/p whole brain radiation and pelvic/spine completed 02/08/15, takes gilotrif daily has mucositis now since 03/06/15,  Biotene makes her sick, taking zofran every 8 hours for nausea daily, very fatigued, poor appetite, difficulty swallowing food,  Has an ulcer on her buttocks in between cheeks, using duoderm and ointment from the hospital, requesting MMW ,did suggest baking soda/salt water rinses,takes imodium  For diarrhea, has helped , no head ache 10:31 AM BP 119/96 mmHg  Pulse 97  Temp(Src) 97.9 F (36.6 C) (Oral)  Resp 20  Ht '5\' 7"'$  (1.702 m)  Wt 133 lb 11.2 oz (60.646 kg)  BMI 20.94 kg/m2  SpO2 99%  Wt Readings from Last 3 Encounters:  03/08/15 133 lb 11.2 oz (60.646 kg)  03/06/15 134 lb 3.2 oz (60.873 kg)  02/20/15 138 lb 3.2 oz (62.687 kg)

## 2015-03-08 NOTE — Progress Notes (Signed)
Radiation Oncology         [phone] ________________________________  Name: [name] MRN: [id number]  Date: 03/08/2015  DOB: 1951-05-27  Follow-Up Visit Note  CC: [name], MD  Curt Bears, MD  Diagnosis: Laura Walter is a 64 year old female presenting to clinic in regards to her non small lung cancer.  Interval Since Last Radiation:  1  months  Narrative:  The patient returns today for routine follow-up. She reports self administering the medications Gilotrif daily, Zofran daily and Imodium. The patient additional reports symptoms of extreme fatigue, poor appetite, difficulty swallowing food, dry throat, mucositis, diarrhea and an ulcer on her buttocks (in between cheeks). She is currently using duoderm and ointment provided via the hospital to alleviate this. She is requesting MMW. The patient stated that she has less difficulty swallowing fluids, but that is it difficult to swallow solids and that they feel as if they become stuck in their throat most of the time. This symptom began during radiation treatments. Be aware that Biotene has has a history of "making her sick." In the past, the patient has suffered from pain and used pain medication to manage. However, now she is not experiencing "any pain at all". Irritation in the mouth and nose. "This morning was the first time I noticed sores in my mouth." Sores were observed on the tongue during today's visit. She projected a healthy mental disposition and was accompanied by two friends for today's visit. "Two or three weeks ago she was in the hospital for 6 days, lying in the bed in the same position," the patient's company stated, in regards to pain in the patients "coccyx" area.           ALLERGIES:  is allergic to sulfonamide derivatives.  Meds: Current Outpatient Prescriptions  Medication Sig Dispense Refill  . afatinib dimaleate (GILOTRIF) 40 MG tablet Take 1 tablet (40 mg total) by mouth daily. Take on an empty stomach  1hr before or 2 hrs after meals. 30 tablet 2  . Dextromethorphan HBr 15 MG/5ML LIQD Take 5 mLs by mouth every 5 (five) hours as needed (cough).    . ondansetron (ZOFRAN) 4 MG tablet Take 1 tablet (4 mg total) by mouth every 8 (eight) hours as needed for nausea or vomiting. 20 tablet 3  . Alum & Mag Hydroxide-Simeth (MAGIC MOUTHWASH) SOLN Take 5 mLs by mouth 4 (four) times daily as needed for mouth pain. 400 mL 1  . emollient (BIAFINE) cream Apply 1 application topically daily.    . feeding supplement, ENSURE ENLIVE, (ENSURE ENLIVE) LIQD Take 237 mLs by mouth 2 (two) times daily between meals. (Patient not taking: Reported on 03/06/2015) 237 mL 12  . HYDROcodone-homatropine (HYCODAN) 5-1.5 MG/5ML syrup Take 5 mLs by mouth every 6 (six) hours as needed for cough. (Patient not taking: Reported on 03/08/2015) 120 mL 0  . ibuprofen (ADVIL,MOTRIN) 200 MG tablet Take 200 mg by mouth every 6 (six) hours as needed for moderate pain.    . Misc. Devices (CANE) MISC by Does not apply route.    . ondansetron (ZOFRAN-ODT) 4 MG disintegrating tablet Take 1 tablet (4 mg total) by mouth every 8 (eight) hours as needed for nausea or vomiting. (Patient not taking: Reported on 03/06/2015) 30 tablet 0  . potassium chloride SA (KLOR-CON M20) 20 MEQ tablet Take 1 tablet (20 mEq total) by mouth daily. 7 tablet 0   No current facility-administered medications for this encounter.    Physical Findings:  The patient is in no acute distress and the patient is alert and oriented x3.  height is '5\' 7"'$  (1.702 m) and weight is 133 lb 11.2 oz (60.646 kg). Her oral temperature is 97.9 F (36.6 C). Her blood pressure is 119/96 and her pulse is 97. Her respiration is 20 and oxygen saturation is 99%.  The patient's weight has decreased by 5 pounds since her last visit with radiation oncology. The patient's weight has decreased by 20 pounds total, since she has stated with radiation oncology.  Lab Findings: Lab Results  Component Value Date    WBC 6.3 03/06/2015   WBC 4.9 02/15/2015   HGB 10.0* 03/06/2015   HGB 10.5* 02/15/2015   HCT 28.9* 03/06/2015   HCT 31.0* 02/15/2015   PLT 188 03/06/2015   PLT 32* 02/15/2015    Lab Results  Component Value Date   NA 139 03/06/2015   NA 140 02/15/2015   K 3.1* 03/06/2015   K 3.0* 02/15/2015   CHLORIDE 104 03/06/2015   CO2 26 03/06/2015   CO2 24 02/15/2015   GLUCOSE 120 03/06/2015   GLUCOSE 84 02/15/2015   BUN 16.5 03/06/2015   BUN 18 02/15/2015   CREATININE 0.7 03/06/2015   CREATININE 0.63 02/15/2015   CREATININE 0.83 11/02/2013   BILITOT 0.69 03/06/2015   BILITOT 1.3* 02/11/2015   ALKPHOS 80 03/06/2015   ALKPHOS 56 02/11/2015   AST 21 03/06/2015   AST 22 02/11/2015   ALT 24 03/06/2015   ALT 21 02/11/2015   PROT 6.2* 03/06/2015   PROT 5.9* 02/11/2015   ALBUMIN 2.6* 03/06/2015   ALBUMIN 3.0* 02/11/2015   CALCIUM 8.6 03/06/2015   CALCIUM 7.6* 02/15/2015   ANIONGAP 9 03/06/2015   ANIONGAP 8 02/15/2015    Radiographic Findings: Dg Chest 2 View  02/11/2015   CLINICAL DATA:  Febrile, cough and shortness of breath. History of metastatic RIGHT lung cancer. Status post radiation 3 days ago.  EXAM: CHEST  2 VIEW  COMPARISON:  Chest radiograph January 24, 2015 and CT chest January 05, 2015  FINDINGS: RIGHT upper lobe mass, similar to prior chest radiograph. No pleural effusion. Cardiomediastinal silhouette is unremarkable. No pneumothorax. Patient is mildly osteopenic. Mild approximate T9 pathologic fracture better characterized on prior CT.  IMPRESSION: No active cardiopulmonary disease.  RIGHT upper lobe mass correspond to patient's known primary lung neoplasm.   Electronically Signed   By: Elon Alas M.D.   On: 02/11/2015 04:05    Impression: Laura Walter is a 64 year old female presenting to clinic in regards to her non small lung cancer. The patient is recovering from the effects of radiation and no longer reports pain. Sores were observed in the mouth and mostly on the  tongue during today's visit upon physical examination.  Plan: The patient has been prescribed Magic Mouthwash to address her new concerning symptom of sores in the mouth (observed during today's visit via physical exam). We fully discussed healthy management methods of other concerning symptoms vocalized during today's visit. Boost and Ensure were recommended to intake daily, before resorting to appetite booster medications. The patient has been made aware of MRI scan of the brain, to be scheduled. The patient has been advised of her follow up appointment with radiation oncology to take place as needed. If the patient develops any further questions or concerns, she has been encouraged to contact Dr. Lisbeth Renshaw, MD.  This document serves as a record of services personally performed by Kyung Rudd, MD. It was created  on his behalf by Lenn Cal, a trained medical scribe. The creation of this record is based on the scribe's personal observations and the provider's statements to them. This document has been checked and approved by the attending provider. ------------------------------------------------  Jodelle Gross, MD, PhD

## 2015-03-08 NOTE — Telephone Encounter (Signed)
Called to [facility] to Laura Walter that a script for magic Mouthwash has been called into [facility] at [place]. Informed her per the pharmacy, that the ingredients have changed to Bendaryl, Lidocaine and Maalox, Swish and Spit.  Pharmacy verified that this is covered by

## 2015-03-13 ENCOUNTER — Other Ambulatory Visit (HOSPITAL_BASED_OUTPATIENT_CLINIC_OR_DEPARTMENT_OTHER): Payer: BC Managed Care – PPO

## 2015-03-13 DIAGNOSIS — C7951 Secondary malignant neoplasm of bone: Secondary | ICD-10-CM | POA: Diagnosis not present

## 2015-03-13 DIAGNOSIS — C3491 Malignant neoplasm of unspecified part of right bronchus or lung: Secondary | ICD-10-CM

## 2015-03-13 DIAGNOSIS — R5383 Other fatigue: Secondary | ICD-10-CM

## 2015-03-13 DIAGNOSIS — C3411 Malignant neoplasm of upper lobe, right bronchus or lung: Secondary | ICD-10-CM | POA: Diagnosis not present

## 2015-03-13 LAB — CBC WITH DIFFERENTIAL/PLATELET
BASO%: 0.2 % (ref 0.0–2.0)
Basophils Absolute: 0 10*3/uL (ref 0.0–0.1)
EOS%: 5.9 % (ref 0.0–7.0)
Eosinophils Absolute: 0.3 10*3/uL (ref 0.0–0.5)
HCT: 29.5 % — ABNORMAL LOW (ref 34.8–46.6)
HGB: 9.4 g/dL — ABNORMAL LOW (ref 11.6–15.9)
LYMPH%: 7.7 % — ABNORMAL LOW (ref 14.0–49.7)
MCH: 29.4 pg (ref 25.1–34.0)
MCHC: 31.9 g/dL (ref 31.5–36.0)
MCV: 92.2 fL (ref 79.5–101.0)
MONO#: 0.5 10*3/uL (ref 0.1–0.9)
MONO%: 10.8 % (ref 0.0–14.0)
NEUT%: 75.4 % (ref 38.4–76.8)
NEUTROS ABS: 3.4 10*3/uL (ref 1.5–6.5)
PLATELETS: 132 10*3/uL — AB (ref 145–400)
RBC: 3.2 10*6/uL — ABNORMAL LOW (ref 3.70–5.45)
RDW: 19.5 % — AB (ref 11.2–14.5)
WBC: 4.4 10*3/uL (ref 3.9–10.3)
lymph#: 0.3 10*3/uL — ABNORMAL LOW (ref 0.9–3.3)

## 2015-03-13 LAB — COMPREHENSIVE METABOLIC PANEL (CC13)
ALT: 20 U/L (ref 0–55)
AST: 22 U/L (ref 5–34)
Albumin: 2.6 g/dL — ABNORMAL LOW (ref 3.5–5.0)
Alkaline Phosphatase: 73 U/L (ref 40–150)
Anion Gap: 7 mEq/L (ref 3–11)
BUN: 11.1 mg/dL (ref 7.0–26.0)
CO2: 27 mEq/L (ref 22–29)
Calcium: 8.5 mg/dL (ref 8.4–10.4)
Chloride: 108 mEq/L (ref 98–109)
Creatinine: 0.7 mg/dL (ref 0.6–1.1)
EGFR: >90 mL/min/{1.73_m2} (ref >90)
GLUCOSE: 98 mg/dL (ref 70–140)
POTASSIUM: 3.5 meq/L (ref 3.5–5.1)
SODIUM: 142 meq/L (ref 136–145)
TOTAL PROTEIN: 6 g/dL — AB (ref 6.4–8.3)
Total Bilirubin: 0.59 mg/dL (ref 0.20–1.20)

## 2015-03-16 ENCOUNTER — Other Ambulatory Visit: Payer: Self-pay | Admitting: Physician Assistant

## 2015-03-16 DIAGNOSIS — C7951 Secondary malignant neoplasm of bone: Secondary | ICD-10-CM

## 2015-03-16 DIAGNOSIS — C3491 Malignant neoplasm of unspecified part of right bronchus or lung: Secondary | ICD-10-CM

## 2015-03-17 ENCOUNTER — Ambulatory Visit
Admission: RE | Admit: 2015-03-17 | Discharge: 2015-03-17 | Disposition: A | Discharge status: Home / Self Care | Payer: BC Managed Care – PPO | Source: Ambulatory Visit | Attending: Gastroenterology | Admitting: Gastroenterology

## 2015-03-17 ENCOUNTER — Other Ambulatory Visit: Payer: Self-pay | Admitting: Gastroenterology

## 2015-03-17 DIAGNOSIS — R1310 Dysphagia, unspecified: Secondary | ICD-10-CM

## 2015-03-20 ENCOUNTER — Other Ambulatory Visit (HOSPITAL_BASED_OUTPATIENT_CLINIC_OR_DEPARTMENT_OTHER): Payer: BC Managed Care – PPO

## 2015-03-20 ENCOUNTER — Telehealth: Payer: Self-pay | Admitting: Physician Assistant

## 2015-03-20 ENCOUNTER — Encounter: Payer: Self-pay | Admitting: Physician Assistant

## 2015-03-20 ENCOUNTER — Ambulatory Visit (HOSPITAL_BASED_OUTPATIENT_CLINIC_OR_DEPARTMENT_OTHER): Payer: BC Managed Care – PPO | Admitting: Physician Assistant

## 2015-03-20 VITALS — BP 100/43 | HR 100 | Temp 97.5°F | Resp 18 | Ht 67.0 in | Wt 132.9 lb

## 2015-03-20 DIAGNOSIS — C7951 Secondary malignant neoplasm of bone: Secondary | ICD-10-CM

## 2015-03-20 DIAGNOSIS — C3411 Malignant neoplasm of upper lobe, right bronchus or lung: Secondary | ICD-10-CM | POA: Diagnosis not present

## 2015-03-20 DIAGNOSIS — C7931 Secondary malignant neoplasm of brain: Secondary | ICD-10-CM | POA: Diagnosis not present

## 2015-03-20 DIAGNOSIS — E876 Hypokalemia: Secondary | ICD-10-CM

## 2015-03-20 DIAGNOSIS — C778 Secondary and unspecified malignant neoplasm of lymph nodes of multiple regions: Secondary | ICD-10-CM | POA: Diagnosis not present

## 2015-03-20 DIAGNOSIS — C3491 Malignant neoplasm of unspecified part of right bronchus or lung: Secondary | ICD-10-CM

## 2015-03-20 DIAGNOSIS — E46 Unspecified protein-calorie malnutrition: Secondary | ICD-10-CM

## 2015-03-20 DIAGNOSIS — R530 Neoplastic (malignant) related fatigue: Secondary | ICD-10-CM

## 2015-03-20 LAB — CBC WITH DIFFERENTIAL/PLATELET
BASO%: 0.5 % (ref 0.0–2.0)
Basophils Absolute: 0 10*3/uL (ref 0.0–0.1)
EOS%: 9.3 % — AB (ref 0.0–7.0)
Eosinophils Absolute: 0.5 10*3/uL (ref 0.0–0.5)
HEMATOCRIT: 32.8 % — AB (ref 34.8–46.6)
HEMOGLOBIN: 10.9 g/dL — AB (ref 11.6–15.9)
LYMPH%: 8.9 % — ABNORMAL LOW (ref 14.0–49.7)
MCH: 30.5 pg (ref 25.1–34.0)
MCHC: 33.2 g/dL (ref 31.5–36.0)
MCV: 91.8 fL (ref 79.5–101.0)
MONO#: 0.6 10*3/uL (ref 0.1–0.9)
MONO%: 10.9 % (ref 0.0–14.0)
NEUT#: 3.6 10*3/uL (ref 1.5–6.5)
NEUT%: 70.4 % (ref 38.4–76.8)
Platelets: 216 10*3/uL (ref 145–400)
RBC: 3.57 10*6/uL — ABNORMAL LOW (ref 3.70–5.45)
RDW: 21.9 % — AB (ref 11.2–14.5)
WBC: 5.2 10*3/uL (ref 3.9–10.3)
lymph#: 0.5 10*3/uL — ABNORMAL LOW (ref 0.9–3.3)

## 2015-03-20 LAB — COMPREHENSIVE METABOLIC PANEL (CC13)
ALT: 18 U/L (ref 0–55)
AST: 19 U/L (ref 5–34)
Albumin: 3.2 g/dL — ABNORMAL LOW (ref 3.5–5.0)
Alkaline Phosphatase: 73 U/L (ref 40–150)
Anion Gap: 12 mEq/L — ABNORMAL HIGH (ref 3–11)
BUN: 11.1 mg/dL (ref 7.0–26.0)
CALCIUM: 8.9 mg/dL (ref 8.4–10.4)
CO2: 25 mEq/L (ref 22–29)
CREATININE: 0.8 mg/dL (ref 0.6–1.1)
Chloride: 103 mEq/L (ref 98–109)
EGFR: 75 mL/min/{1.73_m2} — ABNORMAL LOW (ref >90)
GLUCOSE: 105 mg/dL (ref 70–140)
POTASSIUM: 3.1 meq/L — AB (ref 3.5–5.1)
SODIUM: 140 meq/L (ref 136–145)
Total Bilirubin: 0.64 mg/dL (ref 0.20–1.20)
Total Protein: 6.7 g/dL (ref 6.4–8.3)

## 2015-03-20 MED ORDER — POTASSIUM CHLORIDE CRYS ER 20 MEQ PO TBCR: 20.0000 meq | EXTENDED_RELEASE_TABLET | Freq: Every day | ORAL | Status: DC

## 2015-03-20 MED ORDER — MAGIC MOUTHWASH: 5.0000 mL | Freq: Four times a day (QID) | ORAL | Status: DC | PRN

## 2015-03-20 MED ORDER — MIRTAZAPINE 30 MG PO TABS: 30.0000 mg | ORAL_TABLET | Freq: Every day | ORAL | Status: DC

## 2015-03-20 MED ORDER — METHYLPREDNISOLONE 4 MG PO TBPK: ORAL_TABLET | ORAL | Status: DC

## 2015-03-20 NOTE — Telephone Encounter (Signed)
Called and spoke with patient and she is aware of her appointments

## 2015-03-20 NOTE — Progress Notes (Addendum)
[facility] Telephone:([phone]) [phone]   [phone]  OFFICE PROGRESS NOTE  Laura Asa, MD [address] [place] [address]  DIAGNOSIS: Stage IV (T2a, N3, M1b) Non-small lung cancer, adenocarcinoma with positive EGFR mutation with deletion in exon 19, presented with large right upper lobe lung mass in addition to bilateral mediastinal and left supraclavicular lymphadenopathy as well as multiple bone and brain lesions diagnosed in June 2016  PRIOR THERAPY: Whole brain irradiation under the care of Dr. Lisbeth Renshaw  CURRENT THERAPY: Gilotrif 40 mg by mouth daily. Initially started 02/09/2015 for 2 doses and then held secondary to admission for thrombocytopenia and neutropenia.  INTERVAL HISTORY: Laura Walter 64 y.o. female returns to the clinic today for follow-up visit accompanied by her partner and a friend. She has now completed one month of Gilotrif.she continues to complain of fatigue and decreased appetite. She does admit to some depression as she is "tired of being sick". She is not eating well because she feels that food gets stuck in her throat the effort it takes to chew. She is complaining of some nasal congestion slightly improved with Claritin and continues to use nasal saline, Neosporin and humidifier. She was seen by Dr. Collene Mares on Friday for a barium swallow with no obvious source for her discomfort. We are also awaiting a letter of clearance from her dentist before she is started on Xgeva therapy. She is feeling better except for fatigue. She denied having any significant chest pain, shortness of breath, cough or hemoptysis. She continues to have mild back pain. She denied having any significant nausea or vomiting, no fever or chills. She reports a pressure sore on her buttocks that she would like to do. This first developed and she was in the hospital.  MEDICAL HISTORY: Past Medical History  Diagnosis Date  . Pneumonia 2009 & 2011  . Allergy      seasonal  . H/O reactive hypoglycemia   . Osteopenia   . IBS (irritable bowel syndrome)   . Brain cancer 01/13/15    MRI multiple small brain mets 10 individual lesions  . Bone cancer 01/10/15 PET    lytic lesion lt 1st rib,T9,L3 Lt acetabulum  . Bronchitis   . S/P radiation therapy completed 02/08/15    whole brain ,spine    ALLERGIES:  is allergic to sulfonamide derivatives.  MEDICATIONS:  Current Outpatient Prescriptions  Medication Sig Dispense Refill  . afatinib dimaleate (GILOTRIF) 40 MG tablet Take 1 tablet (40 mg total) by mouth daily. Take on an empty stomach 1hr before or 2 hrs after meals. 30 tablet 2  . Alum & Mag Hydroxide-Simeth (MAGIC MOUTHWASH) SOLN Take 5 mLs by mouth 4 (four) times daily as needed for mouth pain. 400 mL 1  . Dextromethorphan HBr 15 MG/5ML LIQD Take 5 mLs by mouth every 5 (five) hours as needed (cough).    Marland Kitchen emollient (BIAFINE) cream Apply 1 application topically daily.    Marland Kitchen HYDROcodone-homatropine (HYCODAN) 5-1.5 MG/5ML syrup Take 5 mLs by mouth every 6 (six) hours as needed for cough. 120 mL 0  . ondansetron (ZOFRAN) 4 MG tablet Take 1 tablet (4 mg total) by mouth every 8 (eight) hours as needed for nausea or vomiting. 20 tablet 3  . potassium chloride SA (KLOR-CON M20) 20 MEQ tablet Take 1 tablet (20 mEq total) by mouth daily. 7 tablet 0  . feeding supplement, ENSURE ENLIVE, (ENSURE ENLIVE) LIQD Take 237 mLs by mouth  2 (two) times daily between meals. (Patient not taking: Reported on 03/20/2015) 237 mL 12  . ibuprofen (ADVIL,MOTRIN) 200 MG tablet Take 200 mg by mouth every 6 (six) hours as needed for moderate pain.    . methylPREDNISolone (MEDROL DOSEPAK) 4 MG TBPK tablet Take as directed with food 21 tablet 0  . mirtazapine (REMERON) 30 MG tablet Take 1 tablet (30 mg total) by mouth at bedtime. 30 tablet 1  . Misc. Devices (CANE) MISC by Does not apply route.     No current facility-administered medications for this visit.    SURGICAL HISTORY:    Past Surgical History  Procedure Laterality Date  . Colonoscopy  2009    Negative; [facility]  . Tonsillectomy and adenoidectomy    . Wisdom tooth extraction      REVIEW OF SYSTEMS:  Constitutional: positive for fatigue Eyes: negative Ears, nose, mouth, throat, and face: positive for sore mouth, sore throat and discomfort with swallowing Respiratory: negative Cardiovascular: negative Gastrointestinal: positive for dysphagia and odynophagia Genitourinary:negative Integument/breast: negative Hematologic/lymphatic: negative Musculoskeletal:positive for bone pain Neurological: negative Behavioral/Psych: negative Endocrine: negative Allergic/Immunologic: negative   PHYSICAL EXAMINATION: General appearance: alert, cooperative, flushed and no distress Head: Normocephalic, without obvious abnormality, atraumatic Neck: no adenopathy, no JVD, supple, symmetrical, trachea midline and thyroid not enlarged, symmetric, no tenderness/mass/nodules Lymph nodes: Cervical, supraclavicular, and axillary nodes normal. Resp: clear to auscultation bilaterally Back: Tenderness to palpation at the lower thoracic and lumbar vertebrae. Cardio: regular rate and rhythm, S1, S2 normal, no murmur, click, rub or gallop GI: soft, non-tender; bowel sounds normal; no masses,  no organomegaly Extremities: extremities normal, atraumatic, no cyanosis or edema Neurologic: Alert and oriented X 3, normal strength and tone. Normal symmetric reflexes. Normal coordination and gait Skin: Scant active acneiform eruptions noted on the face and scalp.No active lesions within the nares. Approximately 4-5 mm area of skin breakdown with good granulation and no active bleeding or oozing.  ECOG PERFORMANCE STATUS: 1 - Symptomatic but completely ambulatory  Blood pressure 100/43, pulse 100, temperature 97.5 F (36.4 C), temperature source Oral, resp. rate 18, height 5' 7"  (1.702 m), weight 132 lb 14.4 oz (60.283 kg), SpO2 100  %.  LABORATORY DATA: Lab Results  Component Value Date   WBC 5.2 03/20/2015   HGB 10.9* 03/20/2015   HCT 32.8* 03/20/2015   MCV 91.8 03/20/2015   PLT 216 03/20/2015      Chemistry      Component Value Date/Time   NA 140 03/20/2015 1406   NA 140 02/15/2015 0520   K 3.1* 03/20/2015 1406   K 3.0* 02/15/2015 0520   CL 108 02/15/2015 0520   CO2 25 03/20/2015 1406   CO2 24 02/15/2015 0520   BUN 11.1 03/20/2015 1406   BUN 18 02/15/2015 0520   CREATININE 0.8 03/20/2015 1406   CREATININE 0.63 02/15/2015 0520   CREATININE 0.83 11/02/2013 0832      Component Value Date/Time   CALCIUM 8.9 03/20/2015 1406   CALCIUM 7.6* 02/15/2015 0520   ALKPHOS 73 03/20/2015 1406   ALKPHOS 56 02/11/2015 0308   AST 19 03/20/2015 1406   AST 22 02/11/2015 0308   ALT 18 03/20/2015 1406   ALT 21 02/11/2015 0308   BILITOT 0.64 03/20/2015 1406   BILITOT 1.3* 02/11/2015 0308       RADIOGRAPHIC STUDIES: Dg Esophagus  03/17/2015   CLINICAL DATA:  64 year old with current history of metastatic lung cancer for which the patient has received whole brain irradiation, presenting with  dysphagia and a globus sensation when she swallows.  EXAM: ESOPHOGRAM / BARIUM SWALLOW / BARIUM TABLET STUDY  TECHNIQUE: Combined double contrast and single contrast examination performed using effervescent crystals, thick barium liquid, and thin barium liquid. The patient was observed with fluoroscopy swallowing a 13 mm barium sulfate tablet.  FLUOROSCOPY TIME:  Radiation Exposure Index (as provided by the fluoroscopic device): 32 dGy  If the device does not provide the exposure index:  Fluoroscopy Time:  2 min 24 sec  Number of Acquired Images:  0  COMPARISON:  CT chest 01/05/2015 and PET-CT 01/10/2015.  FINDINGS: Patient swallowed the thick and thin barium liquid without difficulty. There is slight break up of the primary esophageal peristaltic wave in the upper esophagus. There are no associated tertiary esophageal contractions. No  fixed esophageal strictures or masses. No evidence of hiatal hernia. Gastroesophageal reflux was not elicited despite use of the water siphon maneuver. The 13 mm barium tablet easily passed through the esophagus into the stomach without obstruction. However, the patient did have difficulty in swallowing the tablet with water. She could could only swallowed the tablet with the thin barium liquid.  Evaluation of the pharynx during rapid sequence imaging with swallows of thin barium liquid demonstrate mild prominence of the cricopharyngeus muscle causing an indentation in the posterior cervical esophagus, though this does not cause significant obstruction. No other focal abnormalities are identified in the pharynx or cervical esophagus. There is no laryngeal penetration.  IMPRESSION: 1. Mild prominence of the cricopharyngeus muscle may account for the patient's globus sensation. 2. Mild esophageal dysmotility. 3. No fixed esophageal strictures or masses. 4. No evidence of hiatal hernia. Gastroesophageal reflux was not elicited with use of the water siphon maneuver. Results were discussed with the patient at the time of the examination.   Electronically Signed   By: Evangeline Dakin M.D.   On: 03/17/2015 14:28    ASSESSMENT AND PLAN: This is a very pleasant 64 years old never smoker white female recently diagnosed with stage IV non-small cell lung cancer, adenocarcinoma with positive EGFR mutation with deletion in exon 19 presented with large right upper lobe lung mass in addition to bilateral mediastinal, supraclavicular lymphadenopathy as well as bone and multiple brain metastasis diagnosed in June 2016.  The patient is status post whole brain irradiation under the care of Dr. Lisbeth Renshaw as well as some radiation therapy to the spine. The patient was discussed with and also seen by Dr. Julien Nordmann. She is to continue on the Gilotrif 40 mg by mouth daily. We will have her follow-up in another 2 weeks for reevaluation. Her  chemistry studies again revealed low potassium at 3.1 and a prescription for potassium chloride 20 meq by mouth daily for the next 7 days was sent to her pharmacy of record via E scribe. Regarding her nasal stuffiness she might to wait to see if it improves over the next couple of weeks before seeing ear nose and throat specialist. For appetite stimulation she'll be placed on a Medrol Dosepak, additionally we will start her on Remeron 30 mg by mouth at bedtime which should help both with her depression as well as appetite stimulation. Her partner will keep an eye on this area of skin breakdown as it continues to heal and let us know if it gets worse in any way. She'll follow-up in one month with a restaging CT scan of the chest, abdomen and pelvis with contrast to reevaluate her disease. Her Magic mouthwash prescription was also refilled.  She was advised to call immediately if she has any concerning symptoms in the interval. The patient voices understanding of current disease status and treatment options and is in agreement with the current care plan.  All questions were answered. The patient knows to call the clinic with any problems, questions or concerns. We can certainly see the patient much sooner if necessary.  Carlton Adam, PA-C 03/20/2015  ADDENDUM: Hematology/Oncology Attending: I had a face to face encounter with the patient. I recommended her care plan. This is a very pleasant 64 years old white female recently diagnosed with a stage IV non-small cell lung cancer, adenocarcinoma with positive EGFR mutation with deletion in exon19. The patient is currently undergoing treatment with targeted therapy with Gilotrif status post 3 weeks of treatment and tolerating her treatment well except for few episodes of diarrhea. She has intermittent nausea and dysphagia. She also has lack of appetite and fatigue. She was seen recently by Dr. Collene Mares for evaluation and had a barium swallow that showed no  concerning findings. Dr. Collene Mares is considering the patient for upper endoscopy but she would like to hold on this procedure for now. I recommended for the patient to continue her current treatment with Gilotrif. For the lack of appetite, I will start the patient on Medrol Dosepak. I would also start the patient on Remeron 30 mg by mouth daily at bedtime for depression and lack of appetite. I recommended for her to call Dr. Collene Mares if no improvement in her dysphagia for discussion of the upper endoscopy. She would come back for follow-up visit in one month's for reevaluation after repeating CT scan of the chest, abdomen and pelvis for restaging of her disease. She was advised to call immediately if she has any concerning symptoms in the interval.  Disclaimer: This note was dictated with voice recognition software. Similar sounding words can inadvertently be transcribed and may not be corrected upon review. Eilleen Kempf., MD 03/22/2015

## 2015-03-20 NOTE — Patient Instructions (Signed)
Continue to Gilotrif 40 mg by mouth daily Take the potassium supplement as prescribed Follow-up in one month with a restaging CT scan of your chest, abdomen and pelvis to reevaluate your disease

## 2015-03-21 ENCOUNTER — Telehealth: Payer: Self-pay | Admitting: *Deleted

## 2015-03-21 ENCOUNTER — Other Ambulatory Visit: Payer: Self-pay | Admitting: Medical Oncology

## 2015-03-21 NOTE — Telephone Encounter (Signed)
PT. HAS HAD EIGHT OUNCES OF FLUIDS TODAY. MS. PEARSON STATES PT. HAS "LOST A LOT OF FLUIDS AT LEAST TWO CUPS". PT. HAS TAKEN ZOFRAN AND 1/4 OF COMPAZINE BUT NOTHING HAS HELPED. PT. HAS HAD DIARRHEA TIMES ONCE TODAY. PT. DOES NOT HAVE A FEVER BUT IS VERY FATIGUED. VERBAL ORDER AND READ BACK TO [facility]. WILL NEED TO GO TO THE EMERGENCY DEPARTMENT TO BE EVALUATED. NOTIFIED MS.PEARSON. SHE VOICES UNDERSTANDING AND WILL CALL WITH A PT. UPDATE TOMORROW.

## 2015-03-22 ENCOUNTER — Ambulatory Visit (HOSPITAL_BASED_OUTPATIENT_CLINIC_OR_DEPARTMENT_OTHER): Payer: BC Managed Care – PPO

## 2015-03-22 ENCOUNTER — Other Ambulatory Visit: Payer: Self-pay | Admitting: Medical Oncology

## 2015-03-22 ENCOUNTER — Other Ambulatory Visit: Payer: Self-pay | Admitting: Physician Assistant

## 2015-03-22 ENCOUNTER — Telehealth: Payer: Self-pay | Admitting: *Deleted

## 2015-03-22 VITALS — BP 138/77 | HR 100 | Temp 98.3°F | Resp 18

## 2015-03-22 DIAGNOSIS — R112 Nausea with vomiting, unspecified: Secondary | ICD-10-CM

## 2015-03-22 MED ORDER — SODIUM CHLORIDE 0.9 % IV SOLN
1000.0000 mL | Freq: Once | INTRAVENOUS | Status: AC
  Administered 2015-03-22: 1000 mL via INTRAVENOUS

## 2015-03-22 MED ORDER — PROCHLORPERAZINE MALEATE 10 MG PO TABS
10.0000 mg | ORAL_TABLET | Freq: Four times a day (QID) | ORAL | Status: DC | PRN
Start: 2015-03-22 — End: 2015-07-26

## 2015-03-22 NOTE — Progress Notes (Signed)
  Radiation Oncology         [phone] ________________________________  Name: Laura Walter MRN: [id number]  Date: 02/08/2015  DOB: 09/14/1950  End of Treatment Note  Diagnosis:   Metastatic lung cancer with brain and bony metastasis     Indication for treatment::  palliative       Radiation treatment dates:   01/25/2015 through 02/08/2015  Site/dose:   The patient had significant bony disease throughout the thoracic and lumbar spine diffusely as well as the left hip region. The patient also has multiple brain metastases. To accomplish treating multiple significant areas of disease within the spine, the patient was treated with a 3-D conformal technique on our tomotherapy unit. She was also treated with a course of whole brain radiation treatment, also delivered on our tomotherapy unit in the 3-D conformal technique mode. These treatments were delivered concurrently.  Narrative: The patient tolerated radiation treatment relatively well.   The patient was followed closely for symptoms, especially nausea which was treated symptomatically. She was able to complete the prescribed course of radiation treatment without significant leg.   Plan: The patient has completed radiation treatment. The patient will return to radiation oncology clinic for routine followup in one month. I advised the patient to call or return sooner if they have any questions or concerns related to their recovery or treatment. ________________________________  Jodelle Gross, M.D., Ph.D.

## 2015-03-22 NOTE — Telephone Encounter (Signed)
Received call from friend Teodoro Spray re:  Pt might need to come in for IVF today.  Spoke with [name], and was informed that pt did not go to ER yesterday as instructed since pt did not have any more vomiting late yesterday.  However, pt vomited x 1 this am.  Pt took antiemetics but vomited back up.  [name] felt that pt will need to come in for IVF today.   Diane, desk nurse notified.  Diane to call [name] for further information. [name]  Phone    [phone].

## 2015-03-22 NOTE — Patient Instructions (Signed)
Dehydration, Adult Dehydration is when you lose more fluids from the body than you take in. Vital organs like the kidneys, brain, and heart cannot function without a proper amount of fluids and salt. Any loss of fluids from the body can cause dehydration.  CAUSES   Vomiting.  Diarrhea.  Excessive sweating.  Excessive urine output.  Fever. SYMPTOMS  Mild dehydration  Thirst.  Dry lips.  Slightly dry mouth. Moderate dehydration  Very dry mouth.  Sunken eyes.  Skin does not bounce back quickly when lightly pinched and released.  Dark urine and decreased urine production.  Decreased tear production.  Headache. Severe dehydration  Very dry mouth.  Extreme thirst.  Rapid, weak pulse (more than 100 beats per minute at rest).  Cold hands and feet.  Not able to sweat in spite of heat and temperature.  Rapid breathing.  Blue lips.  Confusion and lethargy.  Difficulty being awakened.  Minimal urine production.  No tears. DIAGNOSIS  Your caregiver will diagnose dehydration based on your symptoms and your exam. Blood and urine tests will help confirm the diagnosis. The diagnostic evaluation should also identify the cause of dehydration. TREATMENT  Treatment of mild or moderate dehydration can often be done at home by increasing the amount of fluids that you drink. It is best to drink small amounts of fluid more often. Drinking too much at one time can make vomiting worse. Refer to the home care instructions below. Severe dehydration needs to be treated at the hospital where you will probably be given intravenous (IV) fluids that contain water and electrolytes. HOME CARE INSTRUCTIONS   Ask your caregiver about specific rehydration instructions.  Drink enough fluids to keep your urine clear or pale yellow.  Drink small amounts frequently if you have nausea and vomiting.  Eat as you normally do.  Avoid:  Foods or drinks high in sugar.  Carbonated  drinks.  Juice.  Extremely hot or cold fluids.  Drinks with caffeine.  Fatty, greasy foods.  Alcohol.  Tobacco.  Overeating.  Gelatin desserts.  Wash your hands well to avoid spreading bacteria and viruses.  Only take over-the-counter or prescription medicines for pain, discomfort, or fever as directed by your caregiver.  Ask your caregiver if you should continue all prescribed and over-the-counter medicines.  Keep all follow-up appointments with your caregiver. SEEK MEDICAL CARE IF:  You have abdominal pain and it increases or stays in one area (localizes).  You have a rash, stiff neck, or severe headache.  You are irritable, sleepy, or difficult to awaken.  You are weak, dizzy, or extremely thirsty. SEEK IMMEDIATE MEDICAL CARE IF:   You are unable to keep fluids down or you get worse despite treatment.  You have frequent episodes of vomiting or diarrhea.  You have blood or green matter (bile) in your vomit.  You have blood in your stool or your stool looks black and tarry.  You have not urinated in 6 to 8 hours, or you have only urinated a small amount of very dark urine.  You have a fever.  You faint. MAKE SURE YOU:   Understand these instructions.  Will watch your condition.  Will get help right away if you are not doing well or get worse. Document Released: 07/22/2005 Document Revised: 10/14/2011 Document Reviewed: 03/11/2011 ExitCare Patient Information 2015 [employer]. This information is not intended to replace advice given to you by your health care provider. Make sure you discuss any questions you have with your health care   provider.  

## 2015-03-22 NOTE — Telephone Encounter (Signed)
Per Julien Nordmann I instructed bonnie to bring pt in for IVF and to f/u with Dr Collene Mares. appt given today for 1230

## 2015-03-23 ENCOUNTER — Other Ambulatory Visit: Payer: Self-pay | Admitting: Radiation Oncology

## 2015-03-23 ENCOUNTER — Telehealth: Payer: Self-pay | Admitting: *Deleted

## 2015-03-23 DIAGNOSIS — C7931 Secondary malignant neoplasm of brain: Secondary | ICD-10-CM

## 2015-03-23 NOTE — Telephone Encounter (Signed)
CALLED  PATIENT TO INFORM OF MRI, SPOKE WITH PATIENT AND SHE IS AWARE OF THIS TEST

## 2015-03-28 ENCOUNTER — Telehealth: Payer: Self-pay | Admitting: *Deleted

## 2015-03-28 NOTE — Telephone Encounter (Signed)
Teodoro Spray called with questions about possible Endoscopy per Dr. Julien Nordmann by Dr. Collene Mares.  She is still having C/O swallowing.  Is an endoscopy the right way to go?"  Return number [phone]."

## 2015-03-28 NOTE — Telephone Encounter (Signed)
Yes to find out what is causing it.

## 2015-03-29 ENCOUNTER — Telehealth: Payer: Self-pay | Admitting: *Deleted

## 2015-03-29 ENCOUNTER — Ambulatory Visit (HOSPITAL_COMMUNITY)
Admission: RE | Admit: 2015-03-29 | Discharge: 2015-03-29 | Disposition: A | Discharge status: Home / Self Care | Payer: BC Managed Care – PPO | Source: Ambulatory Visit | Attending: Radiation Oncology | Admitting: Radiation Oncology

## 2015-03-29 DIAGNOSIS — C7931 Secondary malignant neoplasm of brain: Secondary | ICD-10-CM | POA: Diagnosis present

## 2015-03-29 DIAGNOSIS — C7951 Secondary malignant neoplasm of bone: Secondary | ICD-10-CM | POA: Diagnosis not present

## 2015-03-29 DIAGNOSIS — C3491 Malignant neoplasm of unspecified part of right bronchus or lung: Secondary | ICD-10-CM | POA: Diagnosis not present

## 2015-03-29 MED ORDER — GADOBENATE DIMEGLUMINE 529 MG/ML IV SOLN
15.0000 mL | Freq: Once | INTRAVENOUS | Status: AC | PRN
  Administered 2015-03-29: 13 mL via INTRAVENOUS

## 2015-03-29 NOTE — Telephone Encounter (Signed)
voice mail from Corie Chiquito requesting results of Laura Walter MRI done today, returned call aftyer  Showing report to Dr, Lisbeth Renshaw, called and spoke with Ms.Pierson for Ms Call, per Dr. Lisbeth Renshaw  Everything looks good,  No new lesions, good report", ms pierson siad to thank Dr. Lisbeth Renshaw and this Rnm for calling back today with good news 12:57 PM

## 2015-03-29 NOTE — Telephone Encounter (Signed)
03-29-2015 at 0950 this nurse called leaving message on voicemail with Dr. Worthy Flank advice to proceed with endoscopy.

## 2015-04-07 ENCOUNTER — Other Ambulatory Visit: Payer: Self-pay | Admitting: *Deleted

## 2015-04-07 ENCOUNTER — Telehealth: Payer: Self-pay | Admitting: *Deleted

## 2015-04-07 MED ORDER — ONDANSETRON HCL 8 MG PO TABS: 8.0000 mg | ORAL_TABLET | Freq: Three times a day (TID) | ORAL | Status: DC | PRN

## 2015-04-07 NOTE — Telephone Encounter (Signed)
Voicemail from patient requesting "refill for zofran.  Pharmacy will contact [facility] with this request.  However I have to take two of the 4 mg tablets to get relief.  Insurance allows twenty pills at a time is why I run out so soon.  I want to change to the 8 mg tablets.  Call me at [phone]."

## 2015-04-07 NOTE — Telephone Encounter (Signed)
Pt called with request to increase Zofran from '4mg'$  to '8mg'$  as her insurance will only cover 20 pills and she is having to take often. Reviewed with Burnetta Sabin, PA Rx refill changed to zofran '8mg'$  q 8hrs, sent pharmacy [facility] [address] Pt notified.

## 2015-04-11 ENCOUNTER — Observation Stay (HOSPITAL_COMMUNITY)
Admission: AD | Admit: 2015-04-11 | Discharge: 2015-04-14 | Disposition: A | Discharge status: Home / Self Care | Payer: BC Managed Care – PPO | Source: Ambulatory Visit | Attending: Gastroenterology | Admitting: Gastroenterology

## 2015-04-11 ENCOUNTER — Encounter (HOSPITAL_COMMUNITY): Payer: Self-pay | Admitting: *Deleted

## 2015-04-11 ENCOUNTER — Ambulatory Visit (HOSPITAL_COMMUNITY): Payer: BC Managed Care – PPO | Admitting: Anesthesiology

## 2015-04-11 ENCOUNTER — Encounter (HOSPITAL_COMMUNITY)
Admission: AD | Disposition: A | Discharge status: Home / Self Care | Payer: Self-pay | Source: Ambulatory Visit | Attending: Gastroenterology

## 2015-04-11 DIAGNOSIS — Z923 Personal history of irradiation: Secondary | ICD-10-CM | POA: Insufficient documentation

## 2015-04-11 DIAGNOSIS — R633 Feeding difficulties, unspecified: Secondary | ICD-10-CM | POA: Diagnosis present

## 2015-04-11 DIAGNOSIS — C3490 Malignant neoplasm of unspecified part of unspecified bronchus or lung: Secondary | ICD-10-CM | POA: Insufficient documentation

## 2015-04-11 DIAGNOSIS — Z681 Body mass index (BMI) 19 or less, adult: Secondary | ICD-10-CM | POA: Insufficient documentation

## 2015-04-11 DIAGNOSIS — C7951 Secondary malignant neoplasm of bone: Secondary | ICD-10-CM | POA: Diagnosis not present

## 2015-04-11 DIAGNOSIS — E43 Unspecified severe protein-calorie malnutrition: Secondary | ICD-10-CM | POA: Diagnosis not present

## 2015-04-11 DIAGNOSIS — C7931 Secondary malignant neoplasm of brain: Secondary | ICD-10-CM | POA: Diagnosis not present

## 2015-04-11 HISTORY — PX: PEG PLACEMENT: SHX5437

## 2015-04-11 HISTORY — PX: ESOPHAGOGASTRODUODENOSCOPY (EGD) WITH PROPOFOL: SHX5813

## 2015-04-11 SURGERY — ESOPHAGOGASTRODUODENOSCOPY (EGD) WITH PROPOFOL
Anesthesia: Monitor Anesthesia Care

## 2015-04-11 MED ORDER — DEXTROMETHORPHAN HBR 15 MG/5ML PO LIQD: 5.0000 mL | ORAL | Status: DC | PRN

## 2015-04-11 MED ORDER — PROPOFOL 500 MG/50ML IV EMUL
INTRAVENOUS | Status: DC | PRN
  Administered 2015-04-11: 30 mg via INTRAVENOUS
  Administered 2015-04-11 (×2): 20 mg via INTRAVENOUS
  Administered 2015-04-11 (×2): 30 mg via INTRAVENOUS

## 2015-04-11 MED ORDER — LIDOCAINE HCL (CARDIAC) 20 MG/ML IV SOLN
INTRAVENOUS | Status: AC
  Filled 2015-04-11: qty 5

## 2015-04-11 MED ORDER — PRUTECT EX EMUL
Freq: Every day | CUTANEOUS | Status: DC
  Filled 2015-04-11: qty 45

## 2015-04-11 MED ORDER — MEPERIDINE HCL 50 MG/ML IJ SOLN: 6.2500 mg | INTRAMUSCULAR | Status: DC | PRN

## 2015-04-11 MED ORDER — PROCHLORPERAZINE EDISYLATE 5 MG/ML IJ SOLN
10.0000 mg | Freq: Four times a day (QID) | INTRAMUSCULAR | Status: DC | PRN
  Administered 2015-04-11 – 2015-04-14 (×4): 10 mg via INTRAVENOUS
  Filled 2015-04-11 (×4): qty 2

## 2015-04-11 MED ORDER — ONDANSETRON HCL 4 MG/2ML IJ SOLN
4.0000 mg | Freq: Once | INTRAMUSCULAR | Status: AC | PRN
  Administered 2015-04-11: 4 mg via INTRAVENOUS
  Filled 2015-04-11: qty 2

## 2015-04-11 MED ORDER — POTASSIUM CHLORIDE CRYS ER 20 MEQ PO TBCR: 20.0000 meq | EXTENDED_RELEASE_TABLET | Freq: Every day | ORAL | Status: DC

## 2015-04-11 MED ORDER — ONDANSETRON HCL 8 MG PO TABS
8.0000 mg | ORAL_TABLET | Freq: Three times a day (TID) | ORAL | Status: DC | PRN
Start: 2015-04-11 — End: 2015-04-14

## 2015-04-11 MED ORDER — PROPOFOL 10 MG/ML IV BOLUS
INTRAVENOUS | Status: AC
  Filled 2015-04-11: qty 20

## 2015-04-11 MED ORDER — SODIUM CHLORIDE 0.9 % IV SOLN: INTRAVENOUS | Status: DC

## 2015-04-11 MED ORDER — POTASSIUM CHLORIDE 20 MEQ/15ML (10%) PO SOLN
20.0000 meq | Freq: Every day | ORAL | Status: DC
  Administered 2015-04-13 – 2015-04-14 (×2): 20 meq via ORAL
  Filled 2015-04-11 (×3): qty 15

## 2015-04-11 MED ORDER — AFATINIB DIMALEATE 40 MG PO TABS: 40.0000 mg | ORAL_TABLET | Freq: Every day | ORAL | Status: DC

## 2015-04-11 MED ORDER — DEXTROMETHORPHAN POLISTIREX ER 30 MG/5ML PO SUER
30.0000 mg | Freq: Two times a day (BID) | ORAL | Status: DC | PRN
  Filled 2015-04-11: qty 5

## 2015-04-11 MED ORDER — HYDROMORPHONE HCL 1 MG/ML IJ SOLN: 0.2500 mg | INTRAMUSCULAR | Status: DC | PRN

## 2015-04-11 MED ORDER — SODIUM CHLORIDE 0.9 % IV SOLN
INTRAVENOUS | Status: DC
  Administered 2015-04-11 – 2015-04-14 (×4): via INTRAVENOUS

## 2015-04-11 MED ORDER — ONDANSETRON HCL 4 MG/2ML IJ SOLN
INTRAMUSCULAR | Status: DC | PRN
Start: 2015-04-11 — End: 2015-04-11
  Administered 2015-04-11: 4 mg via INTRAVENOUS

## 2015-04-11 MED ORDER — HYDROCODONE-HOMATROPINE 5-1.5 MG/5ML PO SYRP: 5.0000 mL | ORAL_SOLUTION | Freq: Four times a day (QID) | ORAL | Status: DC | PRN

## 2015-04-11 MED ORDER — SODIUM CHLORIDE 0.9 % IV SOLN
1.5000 g | Freq: Once | INTRAVENOUS | Status: DC
  Filled 2015-04-11: qty 1.5

## 2015-04-11 MED ORDER — PROPOFOL INFUSION 10 MG/ML OPTIME
INTRAVENOUS | Status: DC | PRN
  Administered 2015-04-11: 100 ug/kg/min via INTRAVENOUS

## 2015-04-11 MED ORDER — EMOLLIENT BASE EX CREA: 1.0000 "application " | TOPICAL_CREAM | Freq: Every day | CUTANEOUS | Status: DC

## 2015-04-11 MED ORDER — PROCHLORPERAZINE MALEATE 10 MG PO TABS
10.0000 mg | ORAL_TABLET | Freq: Four times a day (QID) | ORAL | Status: DC | PRN
  Filled 2015-04-11: qty 1

## 2015-04-11 MED ORDER — AFATINIB DIMALEATE 40 MG PO TABS
40.0000 mg | ORAL_TABLET | Freq: Every day | ORAL | Status: DC
  Administered 2015-04-12 – 2015-04-14 (×3): 40 mg via ORAL

## 2015-04-11 MED ORDER — LACTATED RINGERS IV SOLN
INTRAVENOUS | Status: DC
  Administered 2015-04-11: 13:00:00 via INTRAVENOUS

## 2015-04-11 MED ORDER — MIRTAZAPINE 30 MG PO TABS: 30.0000 mg | ORAL_TABLET | Freq: Every day | ORAL | Status: DC

## 2015-04-11 MED ORDER — ESMOLOL HCL 10 MG/ML IV SOLN
INTRAVENOUS | Status: AC
  Filled 2015-04-11: qty 10

## 2015-04-11 SURGICAL SUPPLY — 15 items

## 2015-04-11 NOTE — H&P (Signed)
  Laura Walter HPI: 64 year old female with metastatic nonsmall cell carcinoma here for a PEG tube placement.  She underwent an EGD by Dr. Collene Mares 1.5 weeks ago with negative findings.  She has more of a solid food dysphagia.  Her nutrition has been low and she is here for the PEG tube.  Past Medical History  Diagnosis Date  . Pneumonia 2009 & 2011  . Allergy     seasonal  . H/O reactive hypoglycemia   . Osteopenia   . IBS (irritable bowel syndrome)   . Brain cancer 01/13/15    MRI multiple small brain mets 10 individual lesions  . Bone cancer 01/10/15 PET    lytic lesion lt 1st rib,T9,L3 Lt acetabulum  . Bronchitis   . S/P radiation therapy completed 02/08/15    whole brain ,spine    Past Surgical History  Procedure Laterality Date  . Colonoscopy  2009    Negative; [facility]  . Tonsillectomy and adenoidectomy    . Wisdom tooth extraction      Family History  Problem Relation Age of Onset  . Transient ischemic attack Mother   . Mental illness Mother     Dementia  . Diabetes Father   . Heart attack Father 29  . Stomach cancer Maternal Aunt   . Heart attack Maternal Aunt     in 12s  . Diabetes Paternal Uncle     X 3  . Heart attack Maternal Grandfather     in 55s    Social History:  reports that she has never smoked. She has never used smokeless tobacco. She reports that she drinks alcohol. She reports that she does not use illicit drugs.  Allergies:  Allergies  Allergen Reactions  . Sulfonamide Derivatives Rash    Childhood reaction    Medications:  Scheduled: . ampicillin-sulbactam (UNASYN) IV  1.5 g Intravenous Once   Continuous: . sodium chloride    . lactated ringers      No results found for this or any previous visit (from the past 24 hour(s)).   No results found.  ROS:  As stated above in the HPI otherwise negative.  Blood pressure 130/74, pulse 68, temperature 97.8 F (36.6 C), temperature source Oral, resp. rate 20, height '5\' 7"'$  (1.702 m), weight  57.153 kg (126 lb), SpO2 100 %.    PE: Gen: NAD, Alert and Oriented HEENT:  [place]/AT, EOMI Neck: Supple, no LAD Lungs: CTA Bilaterally CV: RRR without M/G/R ABM: Soft, NTND, +BS Ext: No C/C/E  Assessment/Plan: 1) PEG tube placement. 2) Metastatic nonsmall cell carcinoma. 3) Malnutrition.  Plan: 1) Admit post PEG. 2) Nutrition consultation.  Laura Walter D 04/11/2015, 2:41 PM

## 2015-04-11 NOTE — Transfer of Care (Signed)
Immediate Anesthesia Transfer of Care Note  Patient: Laura Walter  Procedure(s) Performed: Procedure(s): ESOPHAGOGASTRODUODENOSCOPY (EGD) WITH PROPOFOL (N/A) PERCUTANEOUS ENDOSCOPIC GASTROSTOMY (PEG) PLACEMENT (N/A)  Patient Location: PACU and Endoscopy Unit  Anesthesia Type:MAC  Level of Consciousness: awake, oriented and patient cooperative  Airway & Oxygen Therapy: Patient Spontanous Breathing and Patient connected to nasal cannula oxygen  Post-op Assessment: Report given to RN and Post -op Vital signs reviewed and stable  Post vital signs: Reviewed and stable  Last Vitals:  Filed Vitals:   04/11/15 1314  BP: 130/74  Pulse: 68  Temp: 36.6 C  Resp: 20    Complications: No apparent anesthesia complications

## 2015-04-11 NOTE — Anesthesia Preprocedure Evaluation (Addendum)
Anesthesia Evaluation  Patient identified by MRN, date of birth, ID band Patient awake    Reviewed: Allergy & Precautions, NPO status , Patient's Chart, lab work & pertinent test results  Airway Mallampati: I  TM Distance: >3 FB Neck ROM: Full    Dental   Pulmonary    Pulmonary exam normal       Cardiovascular Normal cardiovascular exam    Neuro/Psych    GI/Hepatic   Endo/Other    Renal/GU      Musculoskeletal   Abdominal   Peds  Hematology   Anesthesia Other Findings   Reproductive/Obstetrics                             Anesthesia Physical Anesthesia Plan  ASA: III  Anesthesia Plan: MAC   Post-op Pain Management:    Induction: Intravenous  Airway Management Planned: Simple Face Mask  Additional Equipment:   Intra-op Plan:   Post-operative Plan:   Informed Consent: I have reviewed the patients History and Physical, chart, labs and discussed the procedure including the risks, benefits and alternatives for the proposed anesthesia with the patient or authorized representative who has indicated his/her understanding and acceptance.     Plan Discussed with: CRNA and Surgeon  Anesthesia Plan Comments:         Anesthesia Quick Evaluation

## 2015-04-11 NOTE — Op Note (Signed)
[facility], [id number]   EGD WITH PEG PROCEDURE REPORT        EXAM DATE: 04/11/2015  PATIENT NAME:          Laura Walter, Laura Walter          MR #:        [id number]  BIRTHDATE:       1950/12/17     VISIT #:     [phone] ATTENDING:     Carol Ada, MD     STATUS:     outpatient ASSISTANT:      Elspeth Cho and Laverta [place]   INDICATIONS:  The patient is a 64 yr old female here for an EGD with PEG due to feeding difficulties PROCEDURE PERFORMED:     EGD with PEG tube placement   MEDICATIONS:     MAC TOPICAL ANESTHETIC:     None  CONSENT: The patient understands the risks and benefits of the procedure and understands that these risks include, but are not limited to: sedation, allergic reaction, infection, perforation and/or bleeding. Alternative means of evaluation and treatment include, among others: physical exam, x-rays, and/or surgical intervention. The patient elects to proceed with this endoscopic procedure.  DESCRIPTION OF PROCEDURE: During intra-op preparation period all mechanical & medical equipment was checked for proper function. Hand hygiene and appropriate measures for infection prevention was taken. After the risks, benefits and alternatives of the procedure were thoroughly explained, Informed consent was verified, confirmed and timeout was successfully executed by the treatment team. The patient was anesthetized with topical anesthesia and the    endoscope was introduced through the mouth and advanced to the second portion of the duodenum.  The instrument was slowly withdrawn as the mucosa was fully examined.    FINDINGS: No overt upper GI findings were identified.  The stomach was then inflated with air, and by a combination of transillumination and manual palpation, the site for the gastrostomy tube placement was selected and marked on the anterior abdominal wall left lateral of the midline and 4 cm below  the costal margin.  The skin of the anterior abdomen was surgically prepped and draped with sterile towels.  Utilizing strict sterile technique, the selected site was then anesthetized with 1% xylocaine by injection into the skin and subcutaneous tissue.  A 1 cm incision was made through the skin and subcutaneous tissue, and the needle/cannula assembly was then passed through the abdominal wall and through the anterior wall of the stomach, maintaining visualization with the endoscope.  A snare device previously placed through the instrument channel was then opened and placed around the cannula, the needle was removed, and the insertion wire was passed through the cannula and into the stomach lumen.  The snare was then loosened from the cannula, and repositioned to snare the insertion wire.  The snare was then pulled up to the endoscope distal tip, and the scope was then withdrawn bringing with it the snare and insertion wire.  The insertion wire was then released from the snare, and then loop-attached to the [place] PEG gastrostomy tube.  Using the "pull technique", the G-tube was then pulled into place by traction on the insertion wire at the abdominal wall end. The G-tube insertion site was then cleansed once again, and the external bolster was placed over the tube to secure it to the abdominal wall.  A sterile dressing was then applied, and the procedure terminated. Estimated blood loss is zero  unless otherwise noted in this procedure report. no abnormalities.  The gastroscope was reinserted and mild esophageal mucosal tears were identified.  These mild esophageal strictures were not grossly visible during the initial inspection. The internal bumper was  rotated 360 degrees.  The gastroscope was then slowly withdrawn and removed.    ADVERSE EVENT:     There were no immediate complications. IMPRESSIONS:     1) Successful PEG tube placement. 2) Two mild  esophageal mucosal tears dilated with passage of the internal bumper.  RECOMMENDATIONS:     1) NPO x 24 hours. 2) Nutrition consultation for tube feeding.  REPEAT EXAM:   ___________________________________ Carol Ada, MD eSigned:  Carol Ada, MD 02-May-2015 3:27 PM   cc:  CPT CODES: ICD CODES:  The ICD and CPT codes recommended by this software are interpretations from the data that the clinical staff has captured with the software.  The verification of the translation of this report to the ICD and CPT codes and modifiers is the sole responsibility of the health care institution and practicing physician where this report was generated.  [place]. will not be held responsible for the validity of the ICD and CPT codes included on this report.  AMA assumes no liability for data contained or not contained herein. CPT is a Designer, television/film set of the [employer].  PATIENT NAME:  Laura Walter, Laura Walter MR#: [id number]

## 2015-04-11 NOTE — Anesthesia Postprocedure Evaluation (Signed)
Anesthesia Post Note  Patient: Laura Walter  Procedure(s) Performed: Procedure(s) (LRB): ESOPHAGOGASTRODUODENOSCOPY (EGD) WITH PROPOFOL (N/A) PERCUTANEOUS ENDOSCOPIC GASTROSTOMY (PEG) PLACEMENT (N/A)  Anesthesia type: MAC  Patient location: PACU  Post pain: Pain level controlled  Post assessment: Patient's Cardiovascular Status Stable  Last Vitals:  Filed Vitals:   04/11/15 1523  BP:   Pulse: 77  Temp: 37.1 C  Resp: 20    Post vital signs: Reviewed and stable  Level of consciousness: sedated  Complications: No apparent anesthesia complications

## 2015-04-12 ENCOUNTER — Telehealth: Payer: Self-pay | Admitting: *Deleted

## 2015-04-12 ENCOUNTER — Encounter: Payer: Self-pay | Admitting: *Deleted

## 2015-04-12 ENCOUNTER — Encounter (HOSPITAL_COMMUNITY): Payer: Self-pay | Admitting: Gastroenterology

## 2015-04-12 DIAGNOSIS — E43 Unspecified severe protein-calorie malnutrition: Secondary | ICD-10-CM | POA: Diagnosis not present

## 2015-04-12 LAB — CBC
HCT: 29.6 % — ABNORMAL LOW (ref 36.0–46.0)
Hemoglobin: 9.7 g/dL — ABNORMAL LOW (ref 12.0–15.0)
MCH: 31.9 pg (ref 26.0–34.0)
MCHC: 32.8 g/dL (ref 30.0–36.0)
MCV: 97.4 fL (ref 78.0–100.0)
PLATELETS: 141 10*3/uL — AB (ref 150–400)
RBC: 3.04 MIL/uL — AB (ref 3.87–5.11)
RDW: 17.4 % — ABNORMAL HIGH (ref 11.5–15.5)
WBC: 7.6 10*3/uL (ref 4.0–10.5)

## 2015-04-12 LAB — BASIC METABOLIC PANEL
ANION GAP: 7 (ref 5–15)
BUN: 17 mg/dL (ref 6–20)
CO2: 22 mmol/L (ref 22–32)
Calcium: 8.4 mg/dL — ABNORMAL LOW (ref 8.9–10.3)
Chloride: 108 mmol/L (ref 101–111)
Creatinine, Ser: 0.81 mg/dL (ref 0.44–1.00)
GFR calc Af Amer: >60 mL/min (ref >60)
Glucose, Bld: 96 mg/dL (ref 65–99)
POTASSIUM: 3.7 mmol/L (ref 3.5–5.1)
SODIUM: 137 mmol/L (ref 135–145)

## 2015-04-12 MED ORDER — OSMOLITE 1.5 CAL PO LIQD
1000.0000 mL | ORAL | Status: DC
  Filled 2015-04-12: qty 1000

## 2015-04-12 MED ORDER — ONDANSETRON HCL 4 MG/2ML IJ SOLN
4.0000 mg | Freq: Four times a day (QID) | INTRAMUSCULAR | Status: DC | PRN
  Administered 2015-04-12 – 2015-04-14 (×7): 4 mg via INTRAVENOUS
  Filled 2015-04-12 (×7): qty 2

## 2015-04-12 MED ORDER — PRO-STAT SUGAR FREE PO LIQD
30.0000 mL | Freq: Every day | ORAL | Status: DC
  Administered 2015-04-13 – 2015-04-14 (×2): 30 mL via ORAL
  Filled 2015-04-12 (×2): qty 30

## 2015-04-12 MED ORDER — KETOROLAC TROMETHAMINE 15 MG/ML IJ SOLN
15.0000 mg | Freq: Three times a day (TID) | INTRAMUSCULAR | Status: DC
  Filled 2015-04-12: qty 1

## 2015-04-12 MED ORDER — OSMOLITE 1.5 CAL PO LIQD
1000.0000 mL | ORAL | Status: DC
  Administered 2015-04-12 – 2015-04-14 (×3): 1000 mL
  Filled 2015-04-12 (×3): qty 1000

## 2015-04-12 MED ORDER — LOPERAMIDE HCL 2 MG PO CAPS
2.0000 mg | ORAL_CAPSULE | ORAL | Status: DC | PRN
  Administered 2015-04-12 – 2015-04-13 (×2): 2 mg via ORAL
  Filled 2015-04-12 (×2): qty 1

## 2015-04-12 MED ORDER — KETOROLAC TROMETHAMINE 15 MG/ML IJ SOLN
15.0000 mg | Freq: Three times a day (TID) | INTRAMUSCULAR | Status: DC | PRN
  Administered 2015-04-12 – 2015-04-14 (×7): 15 mg via INTRAVENOUS
  Filled 2015-04-12 (×6): qty 1

## 2015-04-12 MED ORDER — ENOXAPARIN SODIUM 40 MG/0.4ML ~~LOC~~ SOLN
40.0000 mg | SUBCUTANEOUS | Status: DC
  Administered 2015-04-12 – 2015-04-14 (×3): 40 mg via SUBCUTANEOUS
  Filled 2015-04-12 (×3): qty 0.4

## 2015-04-12 NOTE — Progress Notes (Signed)
Oncology Nurse Navigator Documentation  Oncology Nurse Navigator Flowsheets 04/12/2015  Navigator Encounter Type Other/spoke with Laura Walter and partner Laura Walter today.  They had several questions about treatment and plan.  I explained and listened.  Laura Walter looks good today.  She states she feels better as well.    She asked me about dentist following up on ok to get "bone medication".  I will follow up with Dr. Julien Nordmann.   I spoke with Dr. Julien Nordmann. He was not sure where the information from the dentist office was.    I called Dr. Corene Cornea Russell's office.  They will fax clearance for xgeva or zometa tomorrow.    Patient Visit Type Inpatient  Treatment Phase Treatment  Barriers/Navigation Needs Education  Education Other  Interventions Other  Education Method Verbal  Time Spent with Patient 45

## 2015-04-12 NOTE — Progress Notes (Signed)
Subjective: Patient seem to be doing fairly well after having a PEG placed yesterday. Appreciate recommendations by the nutrition service. Patient has received Toradol for pain control.  Objective: Vital signs in last 24 hours: Temp:  [97.7 F (36.5 C)-98.4 F (36.9 C)] 97.7 F (36.5 C) (09/07 1417) Pulse Rate:  [71-88] 71 (09/07 1417) Resp:  [16-20] 16 (09/07 1417) BP: (108-135)/(64-70) 132/65 mmHg (09/07 1417) SpO2:  [96 %-98 %] 98 % (09/07 1417) Last BM Date:  (PTA)  Intake/Output from previous day: 09/06 0701 - 09/07 0700 In: 850 [I.V.:800; IV Piggyback:50] Out: 200 [Urine:200] Intake/Output this shift: Total I/O In: 2014 [I.V.:2014] Out: -   General appearance: alert, cooperative, appears older than stated age, cachectic, no distress and pale Resp: clear to auscultation bilaterally Cardio: regular rate and rhythm, S1, S2 normal, no murmur, click, rub or gallop GI: soft, non-tender; bowel sounds normal; no masses,  no organomegaly; PEG site appears healthy without erythema Extremities: extremities normal, atraumatic, no cyanosis or edema  Lab Results:  Recent Labs  04/12/15 0835  WBC 7.6  HGB 9.7*  HCT 29.6*  PLT 141*   BMET  Recent Labs  04/12/15 0835  NA 137  K 3.7  CL 108  CO2 22  GLUCOSE 96  BUN 17  CREATININE 0.81  CALCIUM 8.4*   Medications: I have reviewed the patient's current medications.  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 Will start Osmolite tonight and give her family instructions for bolus feeds prior to discharge.  LOS: 1 day   Bernard Donahoo 04/12/2015, 6:51 PM

## 2015-04-12 NOTE — Progress Notes (Signed)
Initial Nutrition Assessment  DOCUMENTATION CODES:   Severe malnutrition in context of chronic illness  INTERVENTION:   TF recommendations: Initiate Osmolite 1.5 @ 10 ml/hr via PEG and increase by 10 ml every 8 hours to goal rate of 50 ml/hr.   30 ml Prostat daily.    Tube feeding regimen at goal provides 1900 kcal (100% of needs), 90 grams of protein, and 914 ml of H2O.   RD to continue to monitor  NUTRITION DIAGNOSIS:   Malnutrition related to chronic illness, cancer and cancer related treatments as evidenced by percent weight loss, severe depletion of muscle mass, severe depletion of body fat.  GOAL:   Patient will meet greater than or equal to 90% of their needs  MONITOR:   Labs, Weight trends, TF tolerance, Skin, I & O's  REASON FOR ASSESSMENT:   Consult Calorie Count (TF recommendations)  ASSESSMENT:   64 year old female with metastatic nonsmall cell carcinoma here for a PEG tube placement. She underwent an EGD by Dr. Collene Mares 1.5 weeks ago with negative findings. She has more of a solid food dysphagia. Her nutrition has been low and she is here for the PEG tube. S/p 9/06 EGD with PEG tube placement. RD provided TF recommendations above.   Pt in room with visitor [name]) at bedside. Per visitor, pt has been following liquid diet for the past 10 days. Pt has been consuming things like custard, ginger ale, sherbet, and milkshakes. Pt was drinking 2 Ensure supplements daily. Pt interested in receiving supplements if diet is advanced.   Per pt, UBW is around 155 lb. Pt has had 29 lb of weight loss over 3 months (18% weight loss x 3 months, significant for time frame).  Nutrition-Focused physical exam completed. Findings are severe fat depletion, severe muscle depletion, and no edema.   Labs reviewed.  Diet Order:  Diet NPO time specified  Skin:  Reviewed, no issues  Last BM:  PTA  Height:   Ht Readings from Last 1 Encounters:  04/11/15 '5\' 7"'$  (1.702 m)     Weight:   Wt Readings from Last 1 Encounters:  04/11/15 126 lb (57.153 kg)    Ideal Body Weight:  61.36 kg  BMI:  Body mass index is 19.73 kg/(m^2).  Estimated Nutritional Needs:   Kcal:  1700-2000  Protein:  85-95g  Fluid:  </= 1.7L/day  EDUCATION NEEDS:   Education needs addressed  Clayton Bibles, MS, RD, LDN Pager: [phone] After Hours Pager: [phone]

## 2015-04-12 NOTE — Telephone Encounter (Signed)
Fax received from Dr. Tawana Scale office : pt has dental clearance from Dr. Joneen Caraway to proceed with Zometa treatments. Most recent dental exam 12/08/14 no areas of concern. Fax given to MD for review

## 2015-04-13 DIAGNOSIS — E43 Unspecified severe protein-calorie malnutrition: Secondary | ICD-10-CM | POA: Diagnosis not present

## 2015-04-13 LAB — GLUCOSE, CAPILLARY: Glucose-Capillary: 112 mg/dL — ABNORMAL HIGH (ref 65–99)

## 2015-04-13 MED ORDER — PANTOPRAZOLE SODIUM 40 MG IV SOLR
40.0000 mg | Freq: Once | INTRAVENOUS | Status: AC
Start: 2015-04-13 — End: 2015-04-13
  Administered 2015-04-13: 40 mg via INTRAVENOUS
  Filled 2015-04-13: qty 40

## 2015-04-13 NOTE — Progress Notes (Addendum)
Nutrition Follow-up  DOCUMENTATION CODES:   Severe malnutrition in context of chronic illness  INTERVENTION:  Recommend patient advance TF to goal prior to switching to bolus feeds to ensure tolerance.  Continue to advance Osmolite 1.5 @ 20 ml/hr via PEG and increase by 10 ml every 8 hours to goal rate of 50 ml/hr.   30 ml Prostat daily.   Tube feeding regimen at goal provides 1900 kcal (100% of needs), 90 grams of protein, and 914 ml of H2O.   RD to continue to monitor for discharge needs  Addendum TF bolus recommendations:  5 cans Osmolite 1.5 daily (8am, 11am, 2pm, 5pm, 8pm) as tolerated. Prostat or other Protein modular (30 ml daily) Recommend free water flushes of 30 ml before and after each bolus. Additional free water needed, 240 ml twice daily. Tube feeding regimen provides 1875 kcal, 90g protein and 1685 ml of H20.  NUTRITION DIAGNOSIS:   Malnutrition related to chronic illness, cancer and cancer related treatments as evidenced by percent weight loss, severe depletion of muscle mass, severe depletion of body fat.  Ongoing.  GOAL:   Patient will meet greater than or equal to 90% of their needs  Not meeting needs yet.  MONITOR:   Labs, Weight trends, TF tolerance, Skin, I & O's  ASSESSMENT:   64 year old female with metastatic nonsmall cell carcinoma here for a PEG tube placement. She underwent an EGD by Dr. Collene Mares 1.5 weeks ago with negative findings. She has more of a solid food dysphagia. Her nutrition has been low and she is here for the PEG tube.  Pt in room with RN providing bolus education. Plan is for pt to discharge on bolus feeds. Recommend patient advance TF to goal prior to switching to bolus feeds to ensure tolerance. Pt tolerating TF with no issues at 20 ml/hr. TF to advance another 10 ml today. RD will monitor for plan.  Pt with no questions at this time.  Labs reviewed.  Diet Order:  Diet NPO time specified  Skin:  Reviewed, no  issues  Last BM:  PTA  Height:   Ht Readings from Last 1 Encounters:  04/11/15 '5\' 7"'$  (1.702 m)    Weight:   Wt Readings from Last 1 Encounters:  04/11/15 126 lb (57.153 kg)    Ideal Body Weight:  61.36 kg  BMI:  Body mass index is 19.73 kg/(m^2).  Estimated Nutritional Needs:   Kcal:  1700-2000  Protein:  85-95g  Fluid:  </= 1.7L/day  EDUCATION NEEDS:   Education needs addressed  Clayton Bibles, MS, RD, LDN Pager: [phone] After Hours Pager: [phone]

## 2015-04-13 NOTE — Progress Notes (Signed)
Subjective: No complaints.  Feeling well.  Objective: Vital signs in last 24 hours: Temp:  [97.5 F (36.4 C)-97.9 F (36.6 C)] 97.5 F (36.4 C) (09/08 1250) Pulse Rate:  [68-72] 69 (09/08 1250) Resp:  [16] 16 (09/08 1250) BP: (130-153)/(66-77) 138/77 mmHg (09/08 1250) SpO2:  [98 %-100 %] 100 % (09/08 1250) Last BM Date:  (PTA)  Intake/Output from previous day: 09/07 0701 - 09/08 0700 In: 2014 [I.V.:2014] Out: -  Intake/Output this shift:    General appearance: alert and no distress GI: soft, non-tender; bowel sounds normal; no masses,  no organomegaly  Lab Results:  Recent Labs  04/12/15 0835  WBC 7.6  HGB 9.7*  HCT 29.6*  PLT 141*   BMET  Recent Labs  04/12/15 0835  NA 137  K 3.7  CL 108  CO2 22  GLUCOSE 96  BUN 17  CREATININE 0.81  CALCIUM 8.4*   LFT No results for input(s): PROT, ALBUMIN, AST, ALT, ALKPHOS, BILITOT, BILIDIR, IBILI in the last 72 hours. PT/INR No results for input(s): LABPROT, INR in the last 72 hours. Hepatitis Panel No results for input(s): HEPBSAG, HCVAB, HEPAIGM, HEPBIGM in the last 72 hours. C-Diff No results for input(s): CDIFFTOX in the last 72 hours. Fecal Lactopherrin No results for input(s): FECLLACTOFRN in the last 72 hours.  Studies/Results: No results found.  Medications:  Scheduled: . afatinib dimaleate  40 mg Oral Daily  . enoxaparin (LOVENOX) injection  40 mg Subcutaneous Q24H  . feeding supplement (PRO-STAT SUGAR FREE 64)  30 mL Oral Daily  . potassium chloride  20 mEq Oral Daily  . PRUTECT   Topical Daily   Continuous: . sodium chloride 100 mL/hr at 04/13/15 0006  . feeding supplement (OSMOLITE 1.5 CAL) 1,000 mL (04/13/15 1108)    Assessment/Plan: 1) Malnutrition.  2) S/p PEG tube. 3) Metastatic lung cancer.   Dietary recommends goal feeding rate of 50 ml per hour before discharging and administering bolus tube feedings.  The PEG site is clean, dry, and intact.    Plan: 1) Advance tube feedings as  tolerated. 2) Anticipate D/C home tomorrow. 3) Consult request sent for [place] to assist in management of the PEG and tube feedings.  LOS: 2 days   Alyan Hartline D 04/13/2015, 2:35 PM

## 2015-04-13 NOTE — Care Management Note (Signed)
Case Management Note  Patient Details  Name: Laura Walter MRN: [id number] Date of Birth: 1950-11-04  Subjective/Objective:         64 yo admitted with feeding difficulties. Hx of metastatic nonsmall cell carcinoma            Action/Plan: From home with significant other  Expected Discharge Date:   (unknown)               Expected Discharge Plan:  [place]  In-House Referral:     Discharge planning Services  CM Consult  Post Acute Care Choice:  Home Health Choice offered to:  Patient, Spouse  DME Arranged:  Tube feeding DME Agency:  [place]:  RN [facility] Agency:  [place]  Status of Service:  In process, will continue to follow  Medicare Important Message Given:    Date Medicare IM Given:    Medicare IM give by:    Date Additional Medicare IM Given:    Additional Medicare Important Message give by:     If discussed at [place] of Stay Meetings, dates discussed:    Additional Comments: Pt and partner expressed interest in having a HHRN to check up on her at DC due to the new PEG and tube feedings. Plan is for Pt to dc home on bolus feeds and is only on continuous feeds at this time, not at her goal rate. Pt and partner offered choice for [facility] services and chose [facility]. Referral given to [facility] rep for need for [facility] and tube feeds. Will need MD order for [facility] and tube feeds prior to DC. CM will continue to follow. Lynnell Catalan, RN 04/13/2015, 1:51 PM

## 2015-04-13 NOTE — Telephone Encounter (Signed)
04-07-2015 order sent in by collaborative nurse with dose change.

## 2015-04-14 DIAGNOSIS — E43 Unspecified severe protein-calorie malnutrition: Secondary | ICD-10-CM | POA: Diagnosis not present

## 2015-04-14 LAB — GLUCOSE, CAPILLARY
GLUCOSE-CAPILLARY: 104 mg/dL — AB (ref 65–99)
GLUCOSE-CAPILLARY: 109 mg/dL — AB (ref 65–99)
Glucose-Capillary: 110 mg/dL — ABNORMAL HIGH (ref 65–99)
Glucose-Capillary: 111 mg/dL — ABNORMAL HIGH (ref 65–99)

## 2015-04-14 MED ORDER — OSMOLITE 1.5 CAL PO LIQD: 237.0000 mL | Freq: Four times a day (QID) | ORAL | Status: AC

## 2015-04-14 MED ORDER — PRO-STAT SUGAR FREE PO LIQD: 30.0000 mL | Freq: Every day | ORAL | Status: DC

## 2015-04-14 NOTE — Discharge Summary (Signed)
Physician Discharge Summary  Patient ID: Laura Walter MRN: [id number] DOB/AGE: 1951-07-29 64 y.o.  Admit date: 04/11/2015 Discharge date: 04/14/2015  Admission Diagnoses: Feeding difficulties.                                         Dysphagia.                                         Metastatic lung cancer.  Discharge Diagnoses: 1) Severe protein calorie malnutrition.                                         2) S/p PEG tube placement.                                         3) Esophageal stenoses s/p dilation. Active Problems:   Feeding difficulties   Discharged Condition: good  Hospital Course: The patient was admitted after a successful routine PEG tube placement.  She was having difficulty with her nutrition with PO intake and she also complained of dysphagia.  A PEG tube was successfully placed on the day of admission and the esophagus was dilated with passage of the internal PEG tube bumper.  No complications were encountered and she tolerated the procedure well.  Pain control was achieved with the use of ketorolac.  The following day Dietary was consulted and they recommended tube feedings with a goal rate of 50 ml/hour.  She tolerated the continuous feedings well, however, she did have some GERD complaints with the increase to 50 ml/hour.  Protonix resolved her GERD complaints.  Osmolite 1.5 and Prostat 30 ml/day were ordered for the patient.  The goal is for her to obtain 1900 kcal with the tube feedings.  At home she will be on bolus feedings and she is being discharged with 237 ml of Osmolite 1.5 QID boluses and Prostat 30 ml/day.  [facility] was consulted to assist her the initial feedings and PEG care at home.  She will follow up with Dr. Collene Mares in 2 weeks.  Consults: Dietary  Significant Diagnostic Studies: labs: routine blood work.  Treatments: PEG tube placement  Discharge Exam: Blood pressure 136/70, pulse 68, temperature 98.3 F (36.8 C), temperature source Oral,  resp. rate 16, height '5\' 7"'$  (1.702 m), weight 57.153 kg (126 lb), SpO2 100 %. General appearance: alert and no distress Resp: clear to auscultation bilaterally GI: PEG tube site clean/dry/intact Extremities: extremities normal, atraumatic, no cyanosis or edema  Disposition: 01-Home or Self Care  Discharge Instructions    Care order/instruction    Complete by:  As directed   Please provide patient with syringes for bolus PEG tube feeding.            Medication List    TAKE these medications        afatinib dimaleate 40 MG tablet  Commonly known as:  GILOTRIF  Take 1 tablet (40 mg total) by mouth daily. Take on an empty stomach 1hr before or 2 hrs after meals.     Cane Misc  by Does not apply route.  feeding supplement (ENSURE ENLIVE) Liqd  Take 237 mLs by mouth 2 (two) times daily between meals.     feeding supplement (OSMOLITE 1.5 CAL) Liqd  Place 237 mLs into feeding tube 4 (four) times daily.     feeding supplement (PRO-STAT SUGAR FREE 64) Liqd  Take 30 mLs by mouth daily.     HYDROcodone-homatropine 5-1.5 MG/5ML syrup  Commonly known as:  HYCODAN  Take 5 mLs by mouth every 6 (six) hours as needed for cough.     ibuprofen 200 MG tablet  Commonly known as:  ADVIL,MOTRIN  Take 200 mg by mouth every 6 (six) hours as needed for moderate pain.     loperamide 2 MG capsule  Commonly known as:  IMODIUM  Take 2 mg by mouth as needed for diarrhea or loose stools (Up to 8 times daily, if needed.).     magic mouthwash Soln  Take 5 mLs by mouth 4 (four) times daily as needed for mouth pain.     methylPREDNISolone 4 MG Tbpk tablet  Commonly known as:  MEDROL DOSEPAK  Take as directed with food     mirtazapine 30 MG tablet  Commonly known as:  REMERON  Take 1 tablet (30 mg total) by mouth at bedtime.     ondansetron 8 MG tablet  Commonly known as:  ZOFRAN  Take 1 tablet (8 mg total) by mouth every 8 (eight) hours as needed for nausea or vomiting.     potassium  chloride SA 20 MEQ tablet  Commonly known as:  KLOR-CON M20  Take 1 tablet (20 mEq total) by mouth daily.     prochlorperazine 10 MG tablet  Commonly known as:  COMPAZINE  Take 1 tablet (10 mg total) by mouth every 6 (six) hours as needed for nausea or vomiting.         SignedCarol Ada D 04/14/2015, 8:13 AM

## 2015-04-14 NOTE — Progress Notes (Signed)
NUTRITION NOTE  Pt currently receiving Osmolite 1.5 @ 50 mL/hr with 30 mL Prostat once/day. This regimen is providing 1900 kcal and 90 grams protein which is meeting needs. Pt reports she is tolerating TF well with no discomfort. She is having diarrhea but states she is feeling stronger. PEG was placed 9/6. Visitor reports that pt has been advised that she can take up to 8 Imodium/day to assist with diarrhea.  Pt d/c today and Home Health to assist pt once she goes home. Pt has been informed that she can eat PO and that this is highly encouraged. The plan is for temporary TF so pt can transition back to fully meeting nutritional needs PO. Pt and visitors state that MD had not provided any information regarding diet restrictions. Encouraged pt to consume soft foods such as FLD, soups with meat and vegetables, and well-cooked meats.   She has been informed to administer 4 cans Osmolite 1.5/day, to drink 2 Ensure/day and to eat and drink as tolerated throughout the day. Encouraged pt to record foods and drinks she is consuming as well as amounts of each of these items.  Will alert Demorest RD to pt as pt is followed at [facility] and receiving treatment there. Informed pt and visitors that [place] RD will further assist pt on an outpatient basis in order to transition her from TF to oral intakes moving forward.  Pt and visitors have no other questions or concerns at this time.     Jarome Matin, RD, LDN Inpatient Clinical Dietitian Pager # [phone] After hours/weekend pager # [phone]

## 2015-04-20 ENCOUNTER — Telehealth: Payer: Self-pay | Admitting: Family Medicine

## 2015-04-20 NOTE — Telephone Encounter (Signed)
Verbal ok given.

## 2015-04-20 NOTE — Telephone Encounter (Signed)
Caller name: Margaretha Sheffield  Relation to pt: RN from [place]  Call back number: [phone]   Reason for call:  Requesting verbal orders for skilled nursing 1week for 6weeks do to infection around peg tube, med teaching and assesment.

## 2015-04-21 ENCOUNTER — Telehealth: Payer: Self-pay | Admitting: *Deleted

## 2015-04-21 ENCOUNTER — Ambulatory Visit (HOSPITAL_COMMUNITY)
Admission: RE | Admit: 2015-04-21 | Discharge: 2015-04-21 | Disposition: A | Discharge status: Home / Self Care | Payer: BC Managed Care – PPO | Source: Ambulatory Visit | Attending: Physician Assistant | Admitting: Physician Assistant

## 2015-04-21 ENCOUNTER — Encounter (HOSPITAL_COMMUNITY): Payer: Self-pay

## 2015-04-21 ENCOUNTER — Other Ambulatory Visit (HOSPITAL_BASED_OUTPATIENT_CLINIC_OR_DEPARTMENT_OTHER): Payer: BC Managed Care – PPO

## 2015-04-21 DIAGNOSIS — C771 Secondary and unspecified malignant neoplasm of intrathoracic lymph nodes: Secondary | ICD-10-CM | POA: Diagnosis not present

## 2015-04-21 DIAGNOSIS — C3491 Malignant neoplasm of unspecified part of right bronchus or lung: Secondary | ICD-10-CM

## 2015-04-21 DIAGNOSIS — Z931 Gastrostomy status: Secondary | ICD-10-CM | POA: Insufficient documentation

## 2015-04-21 DIAGNOSIS — M84454A Pathological fracture, pelvis, initial encounter for fracture: Secondary | ICD-10-CM | POA: Diagnosis not present

## 2015-04-21 DIAGNOSIS — K7689 Other specified diseases of liver: Secondary | ICD-10-CM | POA: Diagnosis not present

## 2015-04-21 DIAGNOSIS — R05 Cough: Secondary | ICD-10-CM | POA: Insufficient documentation

## 2015-04-21 DIAGNOSIS — K868 Other specified diseases of pancreas: Secondary | ICD-10-CM | POA: Diagnosis not present

## 2015-04-21 DIAGNOSIS — M8580 Other specified disorders of bone density and structure, unspecified site: Secondary | ICD-10-CM | POA: Insufficient documentation

## 2015-04-21 DIAGNOSIS — Z923 Personal history of irradiation: Secondary | ICD-10-CM | POA: Insufficient documentation

## 2015-04-21 DIAGNOSIS — C7951 Secondary malignant neoplasm of bone: Secondary | ICD-10-CM | POA: Diagnosis not present

## 2015-04-21 DIAGNOSIS — C7931 Secondary malignant neoplasm of brain: Secondary | ICD-10-CM | POA: Insufficient documentation

## 2015-04-21 DIAGNOSIS — J90 Pleural effusion, not elsewhere classified: Secondary | ICD-10-CM | POA: Diagnosis not present

## 2015-04-21 LAB — COMPREHENSIVE METABOLIC PANEL (CC13)
ALBUMIN: 3 g/dL — AB (ref 3.5–5.0)
ALK PHOS: 61 U/L (ref 40–150)
ALT: 15 U/L (ref 0–55)
AST: 14 U/L (ref 5–34)
Anion Gap: 8 mEq/L (ref 3–11)
BUN: 18.6 mg/dL (ref 7.0–26.0)
CALCIUM: 9 mg/dL (ref 8.4–10.4)
CO2: 26 mEq/L (ref 22–29)
CREATININE: 0.8 mg/dL (ref 0.6–1.1)
Chloride: 103 mEq/L (ref 98–109)
EGFR: 84 mL/min/{1.73_m2} — ABNORMAL LOW (ref >90)
GLUCOSE: 100 mg/dL (ref 70–140)
Potassium: 3.9 mEq/L (ref 3.5–5.1)
SODIUM: 137 meq/L (ref 136–145)
Total Bilirubin: 0.66 mg/dL (ref 0.20–1.20)
Total Protein: 6.3 g/dL — ABNORMAL LOW (ref 6.4–8.3)

## 2015-04-21 LAB — CBC WITH DIFFERENTIAL/PLATELET
BASO%: 0.2 % (ref 0.0–2.0)
BASOS ABS: 0 10*3/uL (ref 0.0–0.1)
EOS%: 4.9 % (ref 0.0–7.0)
Eosinophils Absolute: 0.4 10*3/uL (ref 0.0–0.5)
HEMATOCRIT: 32.1 % — AB (ref 34.8–46.6)
HEMOGLOBIN: 10.8 g/dL — AB (ref 11.6–15.9)
LYMPH#: 0.4 10*3/uL — AB (ref 0.9–3.3)
LYMPH%: 4.1 % — ABNORMAL LOW (ref 14.0–49.7)
MCH: 31.9 pg (ref 25.1–34.0)
MCHC: 33.6 g/dL (ref 31.5–36.0)
MCV: 95.1 fL (ref 79.5–101.0)
MONO#: 0.7 10*3/uL (ref 0.1–0.9)
MONO%: 7.8 % (ref 0.0–14.0)
NEUT%: 83 % — ABNORMAL HIGH (ref 38.4–76.8)
NEUTROS ABS: 7.4 10*3/uL — AB (ref 1.5–6.5)
Platelets: 169 10*3/uL (ref 145–400)
RBC: 3.37 10*6/uL — ABNORMAL LOW (ref 3.70–5.45)
RDW: 18 % — ABNORMAL HIGH (ref 11.2–14.5)
WBC: 8.9 10*3/uL (ref 3.9–10.3)

## 2015-04-21 MED ORDER — IOHEXOL 300 MG/ML  SOLN
100.0000 mL | Freq: Once | INTRAMUSCULAR | Status: AC | PRN
  Administered 2015-04-21: 100 mL via INTRAVENOUS

## 2015-04-21 NOTE — Telephone Encounter (Signed)
SHOULD CT WAIT FOR LAB RESULTS BEFORE DOING THE CT SCAN? PT. SEES DR.MOHAMED ON Monday AT 11:30AM. MAY WAIT FOR LAB RESULTS BEFORE DOING PT.'S CT SCAN.

## 2015-04-24 ENCOUNTER — Encounter: Payer: Self-pay | Admitting: Internal Medicine

## 2015-04-24 ENCOUNTER — Encounter: Payer: Self-pay | Admitting: *Deleted

## 2015-04-24 ENCOUNTER — Telehealth: Payer: Self-pay | Admitting: Internal Medicine

## 2015-04-24 ENCOUNTER — Ambulatory Visit (HOSPITAL_BASED_OUTPATIENT_CLINIC_OR_DEPARTMENT_OTHER): Payer: BC Managed Care – PPO | Admitting: Internal Medicine

## 2015-04-24 VITALS — BP 124/70 | HR 70 | Temp 97.5°F | Resp 18 | Ht 67.0 in | Wt 131.1 lb

## 2015-04-24 DIAGNOSIS — R110 Nausea: Secondary | ICD-10-CM | POA: Insufficient documentation

## 2015-04-24 DIAGNOSIS — C7951 Secondary malignant neoplasm of bone: Secondary | ICD-10-CM | POA: Diagnosis not present

## 2015-04-24 DIAGNOSIS — R197 Diarrhea, unspecified: Secondary | ICD-10-CM

## 2015-04-24 DIAGNOSIS — E46 Unspecified protein-calorie malnutrition: Secondary | ICD-10-CM

## 2015-04-24 DIAGNOSIS — C3491 Malignant neoplasm of unspecified part of right bronchus or lung: Secondary | ICD-10-CM | POA: Diagnosis not present

## 2015-04-24 MED ORDER — OLANZAPINE 10 MG PO TABS: 10.0000 mg | ORAL_TABLET | Freq: Every day | ORAL | Status: DC

## 2015-04-24 NOTE — Progress Notes (Signed)
Oncology Nurse Navigator Documentation  Oncology Nurse Navigator Flowsheets 04/24/2015  Navigator Encounter Type Clinic/MDC  Patient Visit Type Follow-up;Medonc  Treatment Phase Treatment  Barriers/Navigation Needs Education/spoke with Laura Walter and her friends today with Dr. Julien Nordmann.  He had scan results given to her today.  She is have great results in her lungs.  She does have some bone disease.  Patient will be started on xgeva.  Dr. Julien Nordmann instructed her to take Vit D and calcium.  Patient verbalized understanding for treatment plan  Education Other  Interventions -  Education Method -  Support Groups/Services -  Time Spent with Patient 30

## 2015-04-24 NOTE — Telephone Encounter (Signed)
Gave and printed appt sched and avs for pt for Sept and OCT °

## 2015-04-24 NOTE — Progress Notes (Signed)
[facility] [phone]   [phone]  OFFICE PROGRESS NOTE  [name], MD [address] [place] [address]  DIAGNOSIS: Stage IV (T2a, N3, M1b) Non-small lung cancer, adenocarcinoma with positive EGFR mutation with deletion in exon 19, presented with large right upper lobe lung mass in addition to bilateral mediastinal and left supraclavicular lymphadenopathy as well as multiple bone and brain lesions diagnosed in June 2016  PRIOR THERAPY: Whole brain irradiation under the care of Dr. Lisbeth Renshaw  CURRENT THERAPY:  1) Gilotrif 40 mg by mouth daily. Status post 2 months of treatment. 2) Xgeva 120 mg subcutaneously every 4 weeks. First dose 05/01/2015.  INTERVAL HISTORY: Laura Walter 64 y.o. female returns to the clinic today for follow-up visit accompanied by her partner and a friend. The patient is feeling much better except for fatigue and intermittent nausea. She is currently on Zofran and she takes it at least twice a day. She had EGD as well as upper endoscopy recently that were unremarkable for any abnormalities to explain her persistent nausea. The patient also had a PEG tube placed for supplemental nutrition and she gained 2 pounds since her last visit.. She denied having any significant chest pain, shortness of breath, cough or hemoptysis. We received clearance from her dentist regarding the start of Xgeva. She has been tolerating her treatment with Gilotrif 40 mg by mouth daily fairly well with fairly minimal skin rash and well controlled diarrhea. She had repeat CT scan of the chest, abdomen and pelvis as well as MRI of the brain performed recently and she is here for evaluation and discussion of her scan results.  MEDICAL HISTORY: Past Medical History  Diagnosis Date  . Pneumonia 2009 & 2011  . Allergy     seasonal  . H/O reactive hypoglycemia   . Osteopenia   . IBS (irritable bowel syndrome)   . Bronchitis   . S/P  radiation therapy completed 02/08/15    whole brain ,spine  . Brain cancer 01/13/15    MRI multiple small brain mets 10 individual lesions  . Bone cancer 01/10/15 PET    lytic lesion lt 1st rib,T9,L3 Lt acetabulum    ALLERGIES:  is allergic to sulfonamide derivatives.  MEDICATIONS:  Current Outpatient Prescriptions  Medication Sig Dispense Refill  . afatinib dimaleate (GILOTRIF) 40 MG tablet Take 1 tablet (40 mg total) by mouth daily. Take on an empty stomach 1hr before or 2 hrs after meals. 30 tablet 2  . Alum & Mag Hydroxide-Simeth (MAGIC MOUTHWASH) SOLN Take 5 mLs by mouth 4 (four) times daily as needed for mouth pain. 400 mL 1  . Amino Acids-Protein Hydrolys (FEEDING SUPPLEMENT, PRO-STAT SUGAR FREE 64,) LIQD Take 30 mLs by mouth daily. 900 mL 0  . amoxicillin-clavulanate (AUGMENTIN) 875-125 MG per tablet Take 1 tablet by mouth 2 (two) times daily.    . feeding supplement, ENSURE ENLIVE, (ENSURE ENLIVE) LIQD Take 237 mLs by mouth 2 (two) times daily between meals. 237 mL 12  . HYDROcodone-homatropine (HYCODAN) 5-1.5 MG/5ML syrup Take 5 mLs by mouth every 6 (six) hours as needed for cough. (Patient not taking: Reported on 04/11/2015) 120 mL 0  . ibuprofen (ADVIL,MOTRIN) 200 MG tablet Take 200 mg by mouth every 6 (six) hours as needed for moderate pain.    Marland Kitchen loperamide (IMODIUM) 2 MG capsule Take 2 mg by mouth as needed for diarrhea or loose stools (Up to 8 times daily, if  needed.).    Marland Kitchen methylPREDNISolone (MEDROL DOSEPAK) 4 MG TBPK tablet Take as directed with food (Patient not taking: Reported on 04/11/2015) 21 tablet 0  . mirtazapine (REMERON) 30 MG tablet Take 1 tablet (30 mg total) by mouth at bedtime. (Patient not taking: Reported on 04/11/2015) 30 tablet 1  . Misc. Devices (CANE) MISC by Does not apply route.    . Nutritional Supplements (FEEDING SUPPLEMENT, OSMOLITE 1.5 CAL,) LIQD Place 237 mLs into feeding tube 4 (four) times daily. 958 mL 12  . ondansetron (ZOFRAN) 8 MG tablet Take 1 tablet  (8 mg total) by mouth every 8 (eight) hours as needed for nausea or vomiting. 20 tablet 3  . pantoprazole (PROTONIX) 40 MG tablet Take 1 tablet by mouth daily.    . potassium chloride SA (KLOR-CON M20) 20 MEQ tablet Take 1 tablet (20 mEq total) by mouth daily. 7 tablet 0  . prochlorperazine (COMPAZINE) 10 MG tablet Take 1 tablet (10 mg total) by mouth every 6 (six) hours as needed for nausea or vomiting. 30 tablet 1   No current facility-administered medications for this visit.    SURGICAL HISTORY:  Past Surgical History  Procedure Laterality Date  . Colonoscopy  2009    Negative; [facility]  . Tonsillectomy and adenoidectomy    . Wisdom tooth extraction    . Esophagogastroduodenoscopy (egd) with propofol N/A 04/11/2015    Procedure: ESOPHAGOGASTRODUODENOSCOPY (EGD) WITH PROPOFOL;  Surgeon: Carol Ada, MD;  Location: [facility];  Service: Endoscopy;  Laterality: N/A;  . Peg placement N/A 04/11/2015    Procedure: PERCUTANEOUS ENDOSCOPIC GASTROSTOMY (PEG) PLACEMENT;  Surgeon: Carol Ada, MD;  Location: [facility];  Service: Endoscopy;  Laterality: N/A;    REVIEW OF SYSTEMS:  Constitutional: positive for fatigue Eyes: negative Ears, nose, mouth, throat, and face: negative Respiratory: negative Cardiovascular: negative Gastrointestinal: positive for nausea Genitourinary:negative Integument/breast: negative Hematologic/lymphatic: negative Musculoskeletal:positive for muscle weakness Neurological: negative Behavioral/Psych: negative Endocrine: negative Allergic/Immunologic: negative   PHYSICAL EXAMINATION: General appearance: alert, cooperative, flushed and no distress Head: Normocephalic, without obvious abnormality, atraumatic Neck: no adenopathy, no JVD, supple, symmetrical, trachea midline and thyroid not enlarged, symmetric, no tenderness/mass/nodules Lymph nodes: Cervical, supraclavicular, and axillary nodes normal. Resp: clear to auscultation bilaterally Back: Tenderness  to palpation at the lower thoracic and lumbar vertebrae. Cardio: regular rate and rhythm, S1, S2 normal, no murmur, click, rub or gallop GI: soft, non-tender; bowel sounds normal; no masses,  no organomegaly Extremities: extremities normal, atraumatic, no cyanosis or edema Neurologic: Alert and oriented X 3, normal strength and tone. Normal symmetric reflexes. Normal coordination and gait  ECOG PERFORMANCE STATUS: 1 - Symptomatic but completely ambulatory  Blood pressure 124/70, pulse 70, temperature 97.5 F (36.4 C), temperature source Oral, resp. rate 18, height _0  (1.702 m), weight 131 lb 1.6 oz (59.467 kg), SpO2 100 %.  LABORATORY DATA: Lab Results  Component Value Date   WBC 8.9 04/21/2015   HGB 10.8* 04/21/2015   HCT 32.1* 04/21/2015   MCV 95.1 04/21/2015   PLT 169 04/21/2015      Chemistry      Component Value Date/Time   NA 137 04/21/2015 1057   NA 137 04/12/2015 0835   K 3.9 04/21/2015 1057   K 3.7 04/12/2015 0835   CL 108 04/12/2015 0835   CO2 26 04/21/2015 1057   CO2 22 04/12/2015 0835   BUN 18.6 04/21/2015 1057   BUN 17 04/12/2015 0835   CREATININE 0.8 04/21/2015 1057   CREATININE 0.81 04/12/2015 0835  CREATININE 0.83 11/02/2013 0832      Component Value Date/Time   CALCIUM 9.0 04/21/2015 1057   CALCIUM 8.4* 04/12/2015 0835   ALKPHOS 61 04/21/2015 1057   ALKPHOS 56 02/11/2015 0308   AST 14 04/21/2015 1057   AST 22 02/11/2015 0308   ALT 15 04/21/2015 1057   ALT 21 02/11/2015 0308   BILITOT 0.66 04/21/2015 1057   BILITOT 1.3* 02/11/2015 0308       RADIOGRAPHIC STUDIES: Ct Chest W Contrast  04/21/2015   CLINICAL DATA:  Right lung cancer diagnosed 6/16. 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 Brain metastasis. Chemotherapy in progress. Radiation therapy completed in July. Chronic cough. Tenderness of feeding tube site. Restaging of non-small-cell type  EXAM: CT CHEST, ABDOMEN, AND PELVIS WITH CONTRAST  TECHNIQUE: Multidetector CT imaging of the chest, abdomen and  pelvis was performed following the standard protocol during bolus administration of intravenous contrast.  CONTRAST:  158m OMNIPAQUE IOHEXOL 300 MG/ML  SOLN  COMPARISON:  Diagnostic CTs of 01/05/2015.  PET of 01/10/2015.  FINDINGS: CT CHEST FINDINGS  Mediastinum/Nodes: No supraclavicular adenopathy. Tortuous descending thoracic aorta. Mild cardiomegaly with resolution of pericardial effusion. Trace residual fluid is likely physiologic. LAD coronary artery atherosclerosis on image 32. No central pulmonary embolism, on this non-dedicated study.  Resolution of right paratracheal and subcarinal adenopathy. Right hilar adenopathy is also improved to resolved. Upper normal size 1.3 cm node remains image 26.  Lungs/Pleura: Decrease in right pleural effusion. Trace bilateral pleural fluid remains.  Decreased in posterior right upper lobe lung lesion. 2.0 x 2.6 cm on image 18 versus 4.1 x 4.8 cm on the 01/05/2015 exam.  Improved right middle lobe aeration. Minimal opacity remains on image 35, favoring subsegmental atelectasis.  Musculoskeletal: New sclerosis involving the sternal body.  CT ABDOMEN AND PELVIS FINDINGS  Hepatobiliary: Hepatic cysts. Contracted gallbladder, without surrounding inflammation or biliary duct dilatation.  Pancreas: Mild to moderate pancreatic atrophy, without dominant mass or duct dilatation.  Spleen: Normal in size, without focal abnormality.  Adrenals/Urinary Tract: Normal adrenal glands. Normal kidneys, without hydronephrosis. Normal urinary bladder.  Stomach/Bowel: Gastrostomy tube is appropriately positioned, placed since the prior CT. Fluid-filled colon, suggesting a diarrheal state. Normal terminal ileum. Normal small bowel.  Vascular/Lymphatic: Normal caliber of the aorta and branch vessels. No abdominopelvic adenopathy.  Reproductive: Normal uterus and adnexa.  Other: Trace cul-de-sac fluid is similar compared to 01/10/15. No abdominal ascites. No evidence of omental or peritoneal disease.   Musculoskeletal: Lucent lesion within the left ischium measures 2.4 cm today and is similar in size to on the prior exam. A new or increased lesion involving the left inferior pubic ramus has pathologic fracture on image 121 of series 2, new. Osteopenia. Right iliac 6 mm sclerotic lesion is new.  A right-sided L3 lesion has undergone interval healing, and measures 2.9 cm on image 75. Compare 2.1 cm on the prior.  IMPRESSION: 1. Response to therapy of right upper lobe primary and thoracic nodal metastasis. 2. Osseous metastasis ; overall progressive. Some lesions are new and/or have undergone interval healing. There are enlarging lesions, including pathologic fracture which is new in the left inferior pubic ramus. 3. No progressive extraosseous disease identified. 4. Similar small volume cul-de-sac fluid. 5. Fluid-filled colon, suggesting a diarrheal state.   Electronically Signed   By: KAbigail MiyamotoM.D.   On: 04/21/2015 15:15   Mr BJeri CosWJEContrast  03/29/2015   CLINICAL DATA:  64year old female Metastatic lung cancer with brain and bony metastasis.  Radiation  treatment dates:   01/25/2015 through 02/08/2015. Whole brain radiation.  Restaging.  Subsequent encounter.  EXAM: MRI HEAD WITHOUT AND WITH CONTRAST  TECHNIQUE: Multiplanar, multiecho pulse sequences of the brain and surrounding structures were obtained without and with intravenous contrast.  CONTRAST:  27m MULTIHANCE GADOBENATE DIMEGLUMINE 529 MG/ML IV SOLN  COMPARISON:  01/13/2015  FINDINGS: All of the previously seen enhancing brain metastases have regressed. Some lesions are no longer enhancing or visible following contrast (including the left superior cerebellar metastasis). Seven small enhancing metastases remain visible, several of these are punctate/ subtle. The largest measure 4-5 mm diameter (right insula and left parietal lobe). No new enhancement identified (small developmental venous anomaly in the dorsal left brainstem series 11, image  13). Regressed cerebral edema.  There are 2 punctate areas of restricted diffusion in the left superior frontal gyrus affecting subcortical white matter. Series 4, images 36 and 38. Associated T2 and FLAIR hyperintensity but no associated enhancement or mass effect. No other restricted diffusion. Major intracranial vascular flow voids are stable.  Elsewhere stable nonspecific cerebral white matter T2 and FLAIR hyperintensity. No ventriculomegaly. No intracranial hemorrhage identified. Negative pituitary and cervicomedullary junction. No dural thickening. Visualized bone marrow signal remains normal. Negative visualized cervical spine. Mildly increased maxillary sinus mucosal thickening. New left mastoid effusion. Negative nasopharynx.  IMPRESSION: 1. Regressed metastatic disease to the brain following whole brain radiation. No new or increased metastases. 2. 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 No enhancement, mass effect, or hemorrhage. 3. New left mastoid effusion.   Electronically Signed   By: HGenevie AnnM.D.   On: 03/29/2015 10:00   Ct Abdomen Pelvis W Contrast  04/21/2015   CLINICAL DATA:  Right lung cancer diagnosed 6/16. 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 Brain metastasis. Chemotherapy in progress. Radiation therapy completed in July. Chronic cough. Tenderness of feeding tube site. Restaging of non-small-cell type  EXAM: CT CHEST, ABDOMEN, AND PELVIS WITH CONTRAST  TECHNIQUE: Multidetector CT imaging of the chest, abdomen and pelvis was performed following the standard protocol during bolus administration of intravenous contrast.  CONTRAST:  1039mOMNIPAQUE IOHEXOL 300 MG/ML  SOLN  COMPARISON:  Diagnostic CTs of 01/05/2015.  PET of 01/10/2015.  FINDINGS: CT CHEST FINDINGS  Mediastinum/Nodes: No supraclavicular adenopathy. Tortuous descending thoracic aorta. Mild cardiomegaly with resolution of pericardial effusion. Trace residual fluid is likely physiologic. LAD coronary artery  atherosclerosis on image 32. No central pulmonary embolism, on this non-dedicated study.  Resolution of right paratracheal and subcarinal adenopathy. Right hilar adenopathy is also improved to resolved. Upper normal size 1.3 cm node remains image 26.  Lungs/Pleura: Decrease in right pleural effusion. Trace bilateral pleural fluid remains.  Decreased in posterior right upper lobe lung lesion. 2.0 x 2.6 cm on image 18 versus 4.1 x 4.8 cm on the 01/05/2015 exam.  Improved right middle lobe aeration. Minimal opacity remains on image 35, favoring subsegmental atelectasis.  Musculoskeletal: New sclerosis involving the sternal body.  CT ABDOMEN AND PELVIS FINDINGS  Hepatobiliary: Hepatic cysts. Contracted gallbladder, without surrounding inflammation or biliary duct dilatation.  Pancreas: Mild to moderate pancreatic atrophy, without dominant mass or duct dilatation.  Spleen: Normal in size, without focal abnormality.  Adrenals/Urinary Tract: Normal adrenal glands. Normal kidneys, without hydronephrosis. Normal urinary bladder.  Stomach/Bowel: Gastrostomy tube is appropriately positioned, placed since the prior CT. Fluid-filled colon, suggesting a diarrheal state. Normal terminal ileum. Normal small bowel.  Vascular/Lymphatic: Normal caliber of the aorta and branch vessels. No abdominopelvic adenopathy.  Reproductive: Normal uterus  and adnexa.  Other: Trace cul-de-sac fluid is similar compared to 01/10/15. No abdominal ascites. No evidence of omental or peritoneal disease.  Musculoskeletal: Lucent lesion within the left ischium measures 2.4 cm today and is similar in size to on the prior exam. A new or increased lesion involving the left inferior pubic ramus has pathologic fracture on image 121 of series 2, new. Osteopenia. Right iliac 6 mm sclerotic lesion is new.  A right-sided L3 lesion has undergone interval healing, and measures 2.9 cm on image 75. Compare 2.1 cm on the prior.  IMPRESSION: 1. Response to therapy of right  upper lobe primary and thoracic nodal metastasis. 2. Osseous metastasis ; overall progressive. Some lesions are new and/or have undergone interval healing. There are enlarging lesions, including pathologic fracture which is new in the left inferior pubic ramus. 3. No progressive extraosseous disease identified. 4. Similar small volume cul-de-sac fluid. 5. Fluid-filled colon, suggesting a diarrheal state.   Electronically Signed   By: Abigail Miyamoto M.D.   On: 04/21/2015 15:15    ASSESSMENT AND PLAN: This is a very pleasant 64 years old never smoker white female recently diagnosed with stage IV non-small cell lung cancer, adenocarcinoma with positive EGFR mutation with deletion in exon 19 presented with large right upper lobe lung mass in addition to bilateral mediastinal, supraclavicular lymphadenopathy as well as bone and multiple brain metastasis diagnosed in June 2016.  The patient completed whole brain irradiation under the care of Dr. Lisbeth Renshaw. She is currently undergoing treatment with Gilotrif 40 mg by mouth daily and she is tolerating her treatment well. The recent CT scan of the chest, abdomen and pelvis showed response to therapy of the right upper lobe primary as well as thoracic nodal metastasis. She had some evidence of progressive bone disease but some of these could be healing process. I discussed the scan results and showed the images to the patient and her family members. I recommended for her to continue her current treatment with Gilotrif with the same dose. For the metastatic bone disease, I will start the patient on Xgeva 129 mg subcutaneously on monthly basis. The patient was also advised to increased her intake of calcium supplement and vitamin D.. For the nausea I started the patient on Zyprexa 10 mg by mouth daily at bedtime and she will continue on Zofran and Compazine on as-needed basis during the daytime. The patient would come back for follow-up visit in one month's for  reevaluation and management of any adverse effect of her treatment. She was advised to call immediately if she has any concerning symptoms in the interval. The patient voices understanding of current disease status and treatment options and is in agreement with the current care plan.  All questions were answered. The patient knows to call the clinic with any problems, questions or concerns. We can certainly see the patient much sooner if necessary.  I spent 15 minutes counseling the patient face to face. The total time spent in the appointment was 25 minutes.  Disclaimer: This note was dictated with voice recognition software. Similar sounding words can inadvertently be transcribed and may not be corrected upon review.

## 2015-05-01 ENCOUNTER — Ambulatory Visit (HOSPITAL_BASED_OUTPATIENT_CLINIC_OR_DEPARTMENT_OTHER): Payer: BC Managed Care – PPO

## 2015-05-01 VITALS — BP 118/75 | HR 82 | Temp 97.5°F

## 2015-05-01 DIAGNOSIS — C7951 Secondary malignant neoplasm of bone: Secondary | ICD-10-CM

## 2015-05-01 MED ORDER — DENOSUMAB 120 MG/1.7ML ~~LOC~~ SOLN
120.0000 mg | Freq: Once | SUBCUTANEOUS | Status: AC
  Administered 2015-05-01: 120 mg via SUBCUTANEOUS
  Filled 2015-05-01: qty 1.7

## 2015-05-01 NOTE — Patient Instructions (Signed)
Denosumab injection What is this medicine? DENOSUMAB (den oh sue mab) slows bone breakdown. Prolia is used to treat osteoporosis in women after menopause and in men. Xgeva is used to prevent bone fractures and other bone problems caused by cancer bone metastases. Xgeva is also used to treat giant cell tumor of the bone. This medicine may be used for other purposes; ask your health care provider or pharmacist if you have questions. COMMON BRAND NAME(S): Prolia, XGEVA What should I tell my health care provider before I take this medicine? They need to know if you have any of these conditions: -dental disease -eczema -infection or history of infections -kidney disease or on dialysis -low blood calcium or vitamin D -malabsorption syndrome -scheduled to have surgery or tooth extraction -taking medicine that contains denosumab -thyroid or parathyroid disease -an unusual reaction to denosumab, other medicines, foods, dyes, or preservatives -pregnant or trying to get pregnant -breast-feeding How should I use this medicine? This medicine is for injection under the skin. It is given by a health care professional in a hospital or clinic setting. If you are getting Prolia, a special MedGuide will be given to you by the pharmacist with each prescription and refill. Be sure to read this information carefully each time. For Prolia, talk to your pediatrician regarding the use of this medicine in children. Special care may be needed. For Xgeva, talk to your pediatrician regarding the use of this medicine in children. While this drug may be prescribed for children as young as 13 years for selected conditions, precautions do apply. Overdosage: If you think you've taken too much of this medicine contact a poison control center or emergency room at once. Overdosage: If you think you have taken too much of this medicine contact a poison control center or emergency room at once. NOTE: This medicine is only for  you. Do not share this medicine with others. What if I miss a dose? It is important not to miss your dose. Call your doctor or health care professional if you are unable to keep an appointment. What may interact with this medicine? Do not take this medicine with any of the following medications: -other medicines containing denosumab This medicine may also interact with the following medications: -medicines that suppress the immune system -medicines that treat cancer -steroid medicines like prednisone or cortisone This list may not describe all possible interactions. Give your health care provider a list of all the medicines, herbs, non-prescription drugs, or dietary supplements you use. Also tell them if you smoke, drink alcohol, or use illegal drugs. Some items may interact with your medicine. What should I watch for while using this medicine? Visit your doctor or health care professional for regular checks on your progress. Your doctor or health care professional may order blood tests and other tests to see how you are doing. Call your doctor or health care professional if you get a cold or other infection while receiving this medicine. Do not treat yourself. This medicine may decrease your body's ability to fight infection. You should make sure you get enough calcium and vitamin D while you are taking this medicine, unless your doctor tells you not to. Discuss the foods you eat and the vitamins you take with your health care professional. See your dentist regularly. Brush and floss your teeth as directed. Before you have any dental work done, tell your dentist you are receiving this medicine. Do not become pregnant while taking this medicine or for 5 months after stopping   it. Women should inform their doctor if they wish to become pregnant or think they might be pregnant. There is a potential for serious side effects to an unborn child. Talk to your health care professional or pharmacist for more  information. What side effects may I notice from receiving this medicine? Side effects that you should report to your doctor or health care professional as soon as possible: -allergic reactions like skin rash, itching or hives, swelling of the face, lips, or tongue -breathing problems -chest pain -fast, irregular heartbeat -feeling faint or lightheaded, falls -fever, chills, or any other sign of infection -muscle spasms, tightening, or twitches -numbness or tingling -skin blisters or bumps, or is dry, peels, or red -slow healing or unexplained pain in the mouth or jaw -unusual bleeding or bruising Side effects that usually do not require medical attention (Report these to your doctor or health care professional if they continue or are bothersome.): -muscle pain -stomach upset, gas This list may not describe all possible side effects. Call your doctor for medical advice about side effects. You may report side effects to FDA at 1-800-FDA-1088. Where should I keep my medicine? This medicine is only given in a clinic, doctor's office, or other health care setting and will not be stored at home. NOTE: This sheet is a summary. It may not cover all possible information. If you have questions about this medicine, talk to your doctor, pharmacist, or health care provider.  2015, Elsevier/Gold Standard. (2012-01-20 12:37:47)  

## 2015-05-08 ENCOUNTER — Telehealth: Payer: Self-pay | Admitting: *Deleted

## 2015-05-08 NOTE — Telephone Encounter (Signed)
FYI [id number] voicemail retrieved at 1717, forwarded at [place]: "Teodoro Spray calling on behalf of Laura Walter gets sick when she rides in a car.  We want to take her to the beach this weekend.  Navigator suggested we call for phenergan for nausea and to help her sleep.  Return number [phone]."

## 2015-05-09 ENCOUNTER — Telehealth: Payer: Self-pay | Admitting: Medical Oncology

## 2015-05-09 NOTE — Telephone Encounter (Addendum)
She can use OTC motion sickness medication and call us if it does not work. I instructed [name] to get OTC She said she will try Dramamine.Marland Kitchen

## 2015-05-10 ENCOUNTER — Telehealth: Payer: Self-pay | Admitting: Family Medicine

## 2015-05-10 ENCOUNTER — Other Ambulatory Visit: Payer: Self-pay | Admitting: Internal Medicine

## 2015-05-10 DIAGNOSIS — C3490 Malignant neoplasm of unspecified part of unspecified bronchus or lung: Secondary | ICD-10-CM

## 2015-05-10 NOTE — Telephone Encounter (Signed)
Called and LMOVM to inform Laura Walter that we have not seen patient in over 6 months. If pt is having nausea and vomiting she would need an appt with Dr. Birdie Riddle or oncology should be informed.

## 2015-05-10 NOTE — Telephone Encounter (Signed)
Caller name: Margaretha Sheffield Relationship to patient: Nurse for [facility]  Can be reached: [phone] Pharmacy:  Reason for call: She says that pt couldn't weigh in at home because she doesn't have a scale in home. She had 3 cans a day Ofomlyce.  She report she could only take 2 cans on Tuesday, Wednesday and Thursday of last week due to nausea, vomiting.

## 2015-05-10 NOTE — Telephone Encounter (Signed)
Agree.  I am not up to speed on what pt is currently dealing with as I have not seen her recently

## 2015-05-29 ENCOUNTER — Telehealth: Payer: Self-pay | Admitting: Internal Medicine

## 2015-05-29 ENCOUNTER — Ambulatory Visit (HOSPITAL_BASED_OUTPATIENT_CLINIC_OR_DEPARTMENT_OTHER): Payer: BC Managed Care – PPO

## 2015-05-29 ENCOUNTER — Other Ambulatory Visit (HOSPITAL_BASED_OUTPATIENT_CLINIC_OR_DEPARTMENT_OTHER): Payer: BC Managed Care – PPO

## 2015-05-29 ENCOUNTER — Encounter: Payer: Self-pay | Admitting: *Deleted

## 2015-05-29 ENCOUNTER — Encounter: Payer: Self-pay | Admitting: Internal Medicine

## 2015-05-29 ENCOUNTER — Ambulatory Visit (HOSPITAL_BASED_OUTPATIENT_CLINIC_OR_DEPARTMENT_OTHER): Payer: BC Managed Care – PPO | Admitting: Internal Medicine

## 2015-05-29 VITALS — BP 117/63 | HR 83 | Temp 97.5°F | Resp 17 | Ht 67.0 in | Wt 126.3 lb

## 2015-05-29 DIAGNOSIS — C7951 Secondary malignant neoplasm of bone: Secondary | ICD-10-CM

## 2015-05-29 DIAGNOSIS — C3491 Malignant neoplasm of unspecified part of right bronchus or lung: Secondary | ICD-10-CM

## 2015-05-29 DIAGNOSIS — C7931 Secondary malignant neoplasm of brain: Secondary | ICD-10-CM | POA: Diagnosis not present

## 2015-05-29 DIAGNOSIS — R110 Nausea: Secondary | ICD-10-CM

## 2015-05-29 DIAGNOSIS — C3411 Malignant neoplasm of upper lobe, right bronchus or lung: Secondary | ICD-10-CM

## 2015-05-29 DIAGNOSIS — Z5111 Encounter for antineoplastic chemotherapy: Secondary | ICD-10-CM

## 2015-05-29 DIAGNOSIS — C778 Secondary and unspecified malignant neoplasm of lymph nodes of multiple regions: Secondary | ICD-10-CM

## 2015-05-29 DIAGNOSIS — E46 Unspecified protein-calorie malnutrition: Secondary | ICD-10-CM

## 2015-05-29 DIAGNOSIS — R197 Diarrhea, unspecified: Secondary | ICD-10-CM

## 2015-05-29 LAB — COMPREHENSIVE METABOLIC PANEL (CC13)
ALBUMIN: 3.7 g/dL (ref 3.5–5.0)
ALT: 17 U/L (ref 0–55)
ANION GAP: 7 meq/L (ref 3–11)
AST: 17 U/L (ref 5–34)
Alkaline Phosphatase: 62 U/L (ref 40–150)
BILIRUBIN TOTAL: 0.33 mg/dL (ref 0.20–1.20)
BUN: 29 mg/dL — ABNORMAL HIGH (ref 7.0–26.0)
CHLORIDE: 104 meq/L (ref 98–109)
CO2: 28 meq/L (ref 22–29)
Calcium: 9.1 mg/dL (ref 8.4–10.4)
Creatinine: 0.8 mg/dL (ref 0.6–1.1)
EGFR: 80 mL/min/{1.73_m2} — AB (ref >90)
Glucose: 118 mg/dl (ref 70–140)
Potassium: 3.9 mEq/L (ref 3.5–5.1)
SODIUM: 140 meq/L (ref 136–145)
TOTAL PROTEIN: 6.9 g/dL (ref 6.4–8.3)

## 2015-05-29 LAB — CBC WITH DIFFERENTIAL/PLATELET
BASO%: 0.4 % (ref 0.0–2.0)
BASOS ABS: 0 10*3/uL (ref 0.0–0.1)
EOS%: 2.2 % (ref 0.0–7.0)
Eosinophils Absolute: 0.1 10*3/uL (ref 0.0–0.5)
HEMATOCRIT: 33.7 % — AB (ref 34.8–46.6)
HEMOGLOBIN: 11.5 g/dL — AB (ref 11.6–15.9)
LYMPH#: 0.7 10*3/uL — AB (ref 0.9–3.3)
LYMPH%: 11.8 % — ABNORMAL LOW (ref 14.0–49.7)
MCH: 32.8 pg (ref 25.1–34.0)
MCHC: 34.1 g/dL (ref 31.5–36.0)
MCV: 96.3 fL (ref 79.5–101.0)
MONO#: 0.6 10*3/uL (ref 0.1–0.9)
MONO%: 10.5 % (ref 0.0–14.0)
NEUT%: 75.1 % (ref 38.4–76.8)
NEUTROS ABS: 4.2 10*3/uL (ref 1.5–6.5)
Platelets: 202 10*3/uL (ref 145–400)
RBC: 3.5 10*6/uL — ABNORMAL LOW (ref 3.70–5.45)
RDW: 14.9 % — AB (ref 11.2–14.5)
WBC: 5.6 10*3/uL (ref 3.9–10.3)

## 2015-05-29 MED ORDER — DENOSUMAB 120 MG/1.7ML ~~LOC~~ SOLN
120.0000 mg | Freq: Once | SUBCUTANEOUS | Status: AC
  Administered 2015-05-29: 120 mg via SUBCUTANEOUS
  Filled 2015-05-29: qty 1.7

## 2015-05-29 NOTE — Progress Notes (Signed)
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 Lisbeth Renshaw  CURRENT THERAPY:  1) Gilotrif 40 mg by mouth daily. Status post 3 months of treatment. 2) Xgeva 120 mg subcutaneously every 4 weeks. First dose 05/01/2015.  INTERVAL HISTORY: Laura Walter 64 y.o. female returns to the clinic today for follow-up visit accompanied by her partner. The patient is feeling much better except for fatigue and intermittent nausea. She is currently on Zofran and she takes it 3 times a day. She was given prescription for Zyprexa but the patient did not fill it. She lost around 4 pounds since her last visit. She denied having any significant chest pain, shortness of breath, cough or hemoptysis. She has been tolerating her treatment with Gilotrif 40 mg by mouth daily fairly well with fairly minimal skin rash and well controlled diarrhea. She has repeat CBC and comprehensive metabolic panel performed earlier today and she is here for evaluation and discussion of her lab results.  MEDICAL HISTORY: Past Medical History  Diagnosis Date  . Pneumonia 2009 & 2011  . Allergy     seasonal  . H/O reactive hypoglycemia   . Osteopenia   . IBS (irritable bowel syndrome)   . Bronchitis   . S/P radiation therapy completed 02/08/15    whole brain ,spine  . Brain cancer ([facility]) 01/13/15    MRI multiple small brain mets 10 individual lesions  . Bone cancer ([facility]) 01/10/15 PET    lytic lesion lt 1st rib,T9,L3 Lt acetabulum     ALLERGIES:  is allergic to sulfonamide derivatives.  MEDICATIONS:  Current Outpatient Prescriptions  Medication Sig Dispense Refill  . Amino Acids-Protein Hydrolys (FEEDING SUPPLEMENT, PRO-STAT SUGAR FREE 64,) LIQD Take 30 mLs by mouth daily. 900 mL 0  . feeding supplement, ENSURE ENLIVE, (ENSURE ENLIVE) LIQD Take 237 mLs by mouth 2 (two) times daily between meals. 237 mL 12  . GILOTRIF 40 MG tablet TAKE 1 TABLET BY MOUTH ONCE DAILY. TAKE ON AN EMPTY STOMACH 1 HOUR BEFORE OR 2 HOURS AFTER MEALS 30 tablet 2  . HYDROcodone-homatropine (HYCODAN) 5-1.5 MG/5ML syrup Take 5 mLs by mouth every 6 (six) hours as needed for cough. 120 mL 0  . ibuprofen (ADVIL,MOTRIN) 200 MG tablet Take 200 mg by mouth every 6 (six) hours as needed for moderate pain.    Marland Kitchen loperamide (IMODIUM) 2 MG capsule Take 2 mg by mouth as needed for diarrhea or loose stools (Up to 8 times daily, if needed.).    Marland Kitchen Nutritional Supplements (FEEDING SUPPLEMENT, OSMOLITE 1.5 CAL,) LIQD Place 237 mLs into feeding tube 4 (four) times daily. 958 mL 12  . ondansetron (ZOFRAN) 8 MG tablet Take 1 tablet (8 mg total) by mouth every 8 (eight) hours as needed for nausea or vomiting. 20 tablet 3  . pantoprazole (PROTONIX) 40 MG tablet Take 1 tablet by mouth daily.    . prochlorperazine (COMPAZINE) 10 MG tablet Take 1 tablet (10 mg total) by mouth every 6 (six) hours as needed for nausea or vomiting.  30 tablet 1  . OLANZapine (ZYPREXA) 10 MG tablet Take 1 tablet (10 mg total) by mouth at bedtime. (Patient not taking: Reported on 05/29/2015) 30 tablet 1   No current facility-administered medications for this visit.    SURGICAL HISTORY:  Past Surgical History  Procedure Laterality Date  . Colonoscopy  2009    Negative; [facility]  . Tonsillectomy and adenoidectomy    . Wisdom tooth extraction    . Esophagogastroduodenoscopy (egd) with propofol N/A 04/11/2015    Procedure: ESOPHAGOGASTRODUODENOSCOPY (EGD) WITH PROPOFOL;  Surgeon: Carol Ada, MD;  Location: [facility];  Service: Endoscopy;  Laterality: N/A;  . Peg placement N/A 04/11/2015    Procedure: PERCUTANEOUS ENDOSCOPIC GASTROSTOMY (PEG) PLACEMENT;  Surgeon: Carol Ada, MD;  Location: [facility];  Service: Endoscopy;  Laterality: N/A;    REVIEW OF SYSTEMS:  Constitutional: positive for fatigue and weight loss Eyes: negative Ears, nose, mouth, throat, and face: negative Respiratory: negative Cardiovascular: negative Gastrointestinal: positive for nausea Genitourinary:negative Integument/breast: negative Hematologic/lymphatic: negative Musculoskeletal:positive for muscle weakness Neurological: negative Behavioral/Psych: negative Endocrine: negative Allergic/Immunologic: negative   PHYSICAL EXAMINATION: General appearance: alert, cooperative, flushed and no distress Head: Normocephalic, without obvious abnormality, atraumatic Neck: no adenopathy, no JVD, supple, symmetrical, trachea midline and thyroid not enlarged, symmetric, no tenderness/mass/nodules Lymph nodes: Cervical, supraclavicular, and axillary nodes normal. Resp: clear to auscultation bilaterally Back: Tenderness to palpation at the lower thoracic and lumbar vertebrae. Cardio: regular rate and rhythm, S1, S2 normal, no murmur, click, rub or gallop GI: soft, non-tender; bowel sounds normal; no masses,  no organomegaly Extremities: extremities normal, atraumatic, no cyanosis or edema Neurologic: Alert and oriented X 3, normal strength and tone. Normal symmetric reflexes. Normal coordination and gait  ECOG PERFORMANCE STATUS: 1 - Symptomatic but completely ambulatory  Blood pressure 117/63, pulse 83, temperature 97.5 F (36.4 C), temperature source Oral, resp. rate 17, height _0  (1.702 m), weight 126 lb 4.8 oz (57.289 kg), SpO2 99 %.  LABORATORY DATA: Lab Results  Component Value Date   WBC 5.6 05/29/2015   HGB 11.5* 05/29/2015   HCT 33.7* 05/29/2015   MCV 96.3 05/29/2015   PLT 202  05/29/2015      Chemistry      Component Value Date/Time   NA 137 04/21/2015 1057   NA 137 04/12/2015 0835   K 3.9 04/21/2015 1057   K 3.7 04/12/2015 0835   CL 108 04/12/2015 0835   CO2 26 04/21/2015 1057   CO2 22 04/12/2015 0835   BUN 18.6 04/21/2015 1057   BUN 17 04/12/2015 0835   CREATININE 0.8 04/21/2015 1057   CREATININE 0.81 04/12/2015 0835   CREATININE 0.83 11/02/2013 0832      Component Value Date/Time   CALCIUM 9.0 04/21/2015 1057   CALCIUM 8.4* 04/12/2015 0835   ALKPHOS 61 04/21/2015 1057   ALKPHOS 56 02/11/2015 0308   AST 14 04/21/2015 1057   AST 22 02/11/2015 0308   ALT 15 04/21/2015 1057   ALT 21 02/11/2015 0308   BILITOT 0.66 04/21/2015 1057   BILITOT 1.3* 02/11/2015 0308       RADIOGRAPHIC STUDIES: No results found.  ASSESSMENT AND PLAN: This is a very pleasant 64 years old never smoker white female recently diagnosed with stage IV non-small cell lung cancer, adenocarcinoma with positive EGFR mutation with deletion in exon 19 presented with large right upper lobe lung mass in addition to bilateral mediastinal, supraclavicular lymphadenopathy as well as bone and multiple brain metastasis diagnosed in June 2016.  The  patient completed whole brain irradiation under the care of Dr. Lisbeth Renshaw. She is currently undergoing treatment with Gilotrif 40 mg by mouth daily and she is tolerating her treatment well except for persistent nausea and she is currently on Zofran. Her nausea is most likely secondary to her brain metastasis and whole brain irradiation versus her treatment with Gilotrif. I gave the patient the option of reducing her dose of Gilotrif to 30 mg by mouth daily but the patient and her partner refused that. She will continue with the current dose for now. For the metastatic bone disease, I continue the patient on Xgeva 129 mg subcutaneously on monthly basis. The patient was also advised to increased her intake of calcium supplement and vitamin D.. For the  nausea, I strongly recommended for the patient to fill her prescription with Zyprexa 10 mg by mouth daily at bedtime and she will continue on Zofran and Compazine on as-needed basis during the daytime. The patient would come back for follow-up visit in one month for reevaluation and management of any adverse effect of her treatment. She was advised to call immediately if she has any concerning symptoms in the interval. The patient voices understanding of current disease status and treatment options and is in agreement with the current care plan.  All questions were answered. The patient knows to call the clinic with any problems, questions or concerns. We can certainly see the patient much sooner if necessary.  Disclaimer: This note was dictated with voice recognition software. Similar sounding words can inadvertently be transcribed and may not be corrected upon review.

## 2015-05-29 NOTE — Progress Notes (Signed)
Oncology Nurse Navigator Documentation  Oncology Nurse Navigator Flowsheets 05/29/2015  Navigator Encounter Type Clinic/MDCspoke with patient and partner today.  She is still having some nausea.  Dr. Julien Nordmann spoke to her about nausea medication to take.  I reinforced and stated to call if this medication did not work.  Dory Peru, dietitian was contacted to follow up with patient   Patient Visit Type Follow-up  Treatment Phase Treatment  Barriers/Navigation Needs Education  Education Pain/ Symptom Management  Interventions Education Method  Education Method Verbal  Support Groups/Services -  Time Spent with Patient 30

## 2015-05-29 NOTE — Telephone Encounter (Signed)
per pof to sch pt appt-gave pt copy of avs °

## 2015-05-30 ENCOUNTER — Ambulatory Visit: Payer: BC Managed Care – PPO | Admitting: Nutrition

## 2015-05-30 NOTE — Progress Notes (Signed)
64 year old female with lung cancer and brain and bone metastases.  She is a patient of Dr. Julien Nordmann.  Past medical history includes osteopenia, IBS, reactive hypoglycemia.  Medications include Imodium, Zyprexa, Zofran, Protonix, Compazine, and Phenergan.  Labs include albumin 3.7 and glucose 118.  Height: 67 inches. Weight: 126.3 pounds October 24. Usual body weight: 155 pounds in June 2016. BMI: 19.78.  Patient reports decreased oral intake secondary to nausea.  States she has had nausea for several months. Patient noted to have increased fatigue. Patient has had a 19% weight loss in 4 months. Status post feeding tube placement.  Patient is tolerating Osmolite 1.5 - 4 cans a day split into 3 feedings. Protostat created nausea in the past.  The patient has discontinued this. Patient tolerating ginger ale, sherbet, custard, and Jell-O by mouth.  Nutrition diagnosis: Inadequate oral intake related to nausea secondary to non-small cell lung cancer as evidenced by 29 pound weight loss over 4 months.  Intervention: Patient educated on strategies for improving oral intake secondary to nausea. Reviewed nausea, vomiting, fact sheet in detail with patient and provided her with a copy. Encouraged patient to take nausea medication as prescribed by physician and communicate with M.D. if nausea is not improved. Once nausea has improved, recommended patient try Protostat once daily, via feeding tube to provide an additional 100 cal and 15 g protein. Questions were answered.  Teach back method was used.  Contact information was provided.  Monitoring, evaluation, goals: Patient will tolerate tube feedings plus oral intake to minimize further weight loss and promote repletion of muscle mass.  Next visit: Patient to contact me with questions or concerns.  **Disclaimer: This note was dictated with voice recognition software. Similar sounding words can inadvertently be transcribed and this note may  contain transcription errors which may not have been corrected upon publication of note.**

## 2015-06-05 ENCOUNTER — Other Ambulatory Visit: Payer: Self-pay | Admitting: Medical Oncology

## 2015-06-05 DIAGNOSIS — C7951 Secondary malignant neoplasm of bone: Secondary | ICD-10-CM

## 2015-06-05 NOTE — Progress Notes (Signed)
rx for gel overlay faxed to [facility]

## 2015-06-20 ENCOUNTER — Telehealth: Payer: Self-pay | Admitting: *Deleted

## 2015-06-20 NOTE — Telephone Encounter (Signed)
Call from [facility] with [facility].  Ms. Caughlin needs continuous skilled nursing weekly.  She also wants to work with PT and needs orders for OT/PT evaluation.  Needs to work on exercises to work on strength.  Please call Margaretha Sheffield at [phone] with any orders.

## 2015-06-21 NOTE — Telephone Encounter (Signed)
After review with MD, placed call to [place] at [facility] care, gave VO for continuous skilled nursing weekly, OT/PT evaluation.Laura Walter

## 2015-06-23 ENCOUNTER — Other Ambulatory Visit: Payer: Self-pay | Admitting: Medical Oncology

## 2015-06-23 DIAGNOSIS — C7951 Secondary malignant neoplasm of bone: Secondary | ICD-10-CM

## 2015-06-26 ENCOUNTER — Other Ambulatory Visit (HOSPITAL_BASED_OUTPATIENT_CLINIC_OR_DEPARTMENT_OTHER): Payer: BC Managed Care – PPO

## 2015-06-26 ENCOUNTER — Encounter: Payer: Self-pay | Admitting: Internal Medicine

## 2015-06-26 ENCOUNTER — Ambulatory Visit (HOSPITAL_BASED_OUTPATIENT_CLINIC_OR_DEPARTMENT_OTHER): Payer: BC Managed Care – PPO | Admitting: Internal Medicine

## 2015-06-26 ENCOUNTER — Telehealth: Payer: Self-pay | Admitting: Internal Medicine

## 2015-06-26 ENCOUNTER — Ambulatory Visit (HOSPITAL_BASED_OUTPATIENT_CLINIC_OR_DEPARTMENT_OTHER): Payer: BC Managed Care – PPO

## 2015-06-26 ENCOUNTER — Encounter: Payer: Self-pay | Admitting: *Deleted

## 2015-06-26 VITALS — BP 103/68 | HR 84 | Temp 97.0°F | Resp 18 | Ht 67.0 in | Wt 126.4 lb

## 2015-06-26 DIAGNOSIS — C3491 Malignant neoplasm of unspecified part of right bronchus or lung: Secondary | ICD-10-CM

## 2015-06-26 DIAGNOSIS — C7951 Secondary malignant neoplasm of bone: Secondary | ICD-10-CM | POA: Diagnosis not present

## 2015-06-26 LAB — COMPREHENSIVE METABOLIC PANEL (CC13)
ALBUMIN: 3.5 g/dL (ref 3.5–5.0)
ALT: 17 U/L (ref 0–55)
AST: 18 U/L (ref 5–34)
Alkaline Phosphatase: 56 U/L (ref 40–150)
Anion Gap: 7 mEq/L (ref 3–11)
BUN: 42.7 mg/dL — AB (ref 7.0–26.0)
CALCIUM: 9.4 mg/dL (ref 8.4–10.4)
CO2: 27 mEq/L (ref 22–29)
CREATININE: 0.8 mg/dL (ref 0.6–1.1)
Chloride: 105 mEq/L (ref 98–109)
EGFR: 76 mL/min/{1.73_m2} — ABNORMAL LOW (ref >90)
Glucose: 91 mg/dl (ref 70–140)
Potassium: 4 mEq/L (ref 3.5–5.1)
Sodium: 139 mEq/L (ref 136–145)
TOTAL PROTEIN: 6.5 g/dL (ref 6.4–8.3)
Total Bilirubin: 0.36 mg/dL (ref 0.20–1.20)

## 2015-06-26 LAB — CBC WITH DIFFERENTIAL/PLATELET
BASO%: 0.3 % (ref 0.0–2.0)
Basophils Absolute: 0 10*3/uL (ref 0.0–0.1)
EOS ABS: 0.1 10*3/uL (ref 0.0–0.5)
EOS%: 1.7 % (ref 0.0–7.0)
HEMATOCRIT: 31.8 % — AB (ref 34.8–46.6)
HGB: 10.9 g/dL — ABNORMAL LOW (ref 11.6–15.9)
LYMPH%: 11.2 % — AB (ref 14.0–49.7)
MCH: 33 pg (ref 25.1–34.0)
MCHC: 34.3 g/dL (ref 31.5–36.0)
MCV: 96.3 fL (ref 79.5–101.0)
MONO#: 0.6 10*3/uL (ref 0.1–0.9)
MONO%: 12.5 % (ref 0.0–14.0)
NEUT%: 74.3 % (ref 38.4–76.8)
NEUTROS ABS: 3.8 10*3/uL (ref 1.5–6.5)
Platelets: 211 10*3/uL (ref 145–400)
RBC: 3.3 10*6/uL — ABNORMAL LOW (ref 3.70–5.45)
RDW: 13.9 % (ref 11.2–14.5)
WBC: 5.1 10*3/uL (ref 3.9–10.3)
lymph#: 0.6 10*3/uL — ABNORMAL LOW (ref 0.9–3.3)

## 2015-06-26 MED ORDER — DENOSUMAB 120 MG/1.7ML ~~LOC~~ SOLN
120.0000 mg | Freq: Once | SUBCUTANEOUS | Status: AC
  Administered 2015-06-26: 120 mg via SUBCUTANEOUS
  Filled 2015-06-26: qty 1.7

## 2015-06-26 NOTE — Telephone Encounter (Signed)
Gave patient avs report and appointments for December. [facility] will call re ct - patient aware.

## 2015-06-26 NOTE — Progress Notes (Signed)
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 Lisbeth Renshaw  CURRENT THERAPY:  1) Gilotrif 40 mg by mouth daily. Status post 4 months of treatment. 2) Xgeva 120 mg subcutaneously every 4 weeks. First dose 05/01/2015.  INTERVAL HISTORY: Laura Walter 64 y.o. female returns to the clinic today for follow-up visit accompanied by her partner. The patient is feeling much better except for mild fatigue. Her nausea has significantly improved after the patient was started on treatment with Zyprexa. She did not require any other nausea medications.  She denied having any significant chest pain, shortness of breath, cough or hemoptysis. She has been tolerating her treatment with Gilotrif 40 mg by mouth daily fairly well with fairly minimal skin rash and diarrhea. She has repeat CBC and comprehensive metabolic panel performed earlier today and she is here for evaluation and discussion of her lab results.  MEDICAL HISTORY: Past Medical History  Diagnosis Date  . Pneumonia 2009 & 2011  . Allergy     seasonal  . H/O reactive hypoglycemia   . Osteopenia   . IBS (irritable bowel syndrome)   . Bronchitis   . S/P radiation therapy completed 02/08/15    whole brain ,spine  . Brain cancer ([facility]) 01/13/15    MRI multiple small brain mets 10 individual lesions  . Bone cancer ([facility]) 01/10/15 PET    lytic lesion lt 1st rib,T9,L3 Lt acetabulum    ALLERGIES:  is allergic to sulfonamide derivatives.  MEDICATIONS:    Current Outpatient Prescriptions  Medication Sig Dispense Refill  . Amino Acids-Protein Hydrolys (FEEDING SUPPLEMENT, PRO-STAT SUGAR FREE 64,) LIQD Take 30 mLs by mouth daily. 900 mL 0  . feeding supplement, ENSURE ENLIVE, (ENSURE ENLIVE) LIQD Take 237 mLs by mouth 2 (two) times daily between meals. 237 mL 12  . GILOTRIF 40 MG tablet TAKE 1 TABLET BY MOUTH ONCE DAILY. TAKE ON AN EMPTY STOMACH 1 HOUR BEFORE OR 2 HOURS AFTER MEALS 30 tablet 2  . HYDROcodone-homatropine (HYCODAN) 5-1.5 MG/5ML syrup Take 5 mLs by mouth every 6 (six) hours as needed for cough. 120 mL 0  . ibuprofen (ADVIL,MOTRIN) 200 MG tablet Take 200 mg by mouth every 6 (six) hours as needed for moderate pain.    Marland Kitchen loperamide (IMODIUM) 2 MG capsule Take 2 mg by mouth as needed for diarrhea or loose stools (Up to 8 times daily, if needed.).    Marland Kitchen OLANZapine (ZYPREXA) 10 MG tablet Take 1 tablet (10 mg total) by mouth at bedtime. 30 tablet 1  . ondansetron (ZOFRAN) 8 MG tablet Take 1 tablet (8 mg total) by mouth every 8 (eight) hours as needed for nausea or vomiting. 20 tablet 3  . pantoprazole (PROTONIX) 40 MG tablet Take 1 tablet by mouth daily.    . prochlorperazine (COMPAZINE) 10 MG tablet Take 1 tablet (10 mg total) by mouth every 6 (six) hours as needed for nausea or vomiting. 30 tablet 1  . Nutritional Supplements (FEEDING SUPPLEMENT, OSMOLITE 1.5 CAL,) LIQD Place 237 mLs into feeding  tube 4 (four) times daily. 958 mL 12   No current facility-administered medications for this visit.   Facility-Administered Medications Ordered in Other Visits  Medication Dose Route Frequency Provider Last Rate Last Dose  . denosumab (XGEVA) injection 120 mg  120 mg Subcutaneous Once Curt Bears, MD        SURGICAL HISTORY:  Past Surgical History  Procedure Laterality Date  . Colonoscopy  2009    Negative; [facility]  . Tonsillectomy and adenoidectomy    . Wisdom tooth extraction    . Esophagogastroduodenoscopy (egd) with propofol N/A  04/11/2015    Procedure: ESOPHAGOGASTRODUODENOSCOPY (EGD) WITH PROPOFOL;  Surgeon: Carol Ada, MD;  Location: [facility];  Service: Endoscopy;  Laterality: N/A;  . Peg placement N/A 04/11/2015    Procedure: PERCUTANEOUS ENDOSCOPIC GASTROSTOMY (PEG) PLACEMENT;  Surgeon: Carol Ada, MD;  Location: [facility];  Service: Endoscopy;  Laterality: N/A;    REVIEW OF SYSTEMS:  A comprehensive review of systems was negative except for: Constitutional: positive for fatigue   PHYSICAL EXAMINATION: General appearance: alert, cooperative, flushed and no distress Head: Normocephalic, without obvious abnormality, atraumatic Neck: no adenopathy, no JVD, supple, symmetrical, trachea midline and thyroid not enlarged, symmetric, no tenderness/mass/nodules Lymph nodes: Cervical, supraclavicular, and axillary nodes normal. Resp: clear to auscultation bilaterally Back: Tenderness to palpation at the lower thoracic and lumbar vertebrae. Cardio: regular rate and rhythm, S1, S2 normal, no murmur, click, rub or gallop GI: soft, non-tender; bowel sounds normal; no masses,  no organomegaly Extremities: extremities normal, atraumatic, no cyanosis or edema Neurologic: Alert and oriented X 3, normal strength and tone. Normal symmetric reflexes. Normal coordination and gait  ECOG PERFORMANCE STATUS: 1 - Symptomatic but completely ambulatory  Blood pressure 103/68, pulse 84, temperature 97 F (36.1 C), temperature source Oral, resp. rate 18, height 5' 7"  (1.702 m), weight 126 lb 6.4 oz (57.335 kg), SpO2 99 %.  LABORATORY DATA: Lab Results  Component Value Date   WBC 5.1 06/26/2015   HGB 10.9* 06/26/2015   HCT 31.8* 06/26/2015   MCV 96.3 06/26/2015   PLT 211 06/26/2015      Chemistry      Component Value Date/Time   NA 139 06/26/2015 1132   NA 137 04/12/2015 0835   K 4.0 06/26/2015 1132   K 3.7 04/12/2015 0835   CL 108 04/12/2015 0835   CO2 27 06/26/2015 1132   CO2 22 04/12/2015 0835   BUN 42.7*  06/26/2015 1132   BUN 17 04/12/2015 0835   CREATININE 0.8 06/26/2015 1132   CREATININE 0.81 04/12/2015 0835   CREATININE 0.83 11/02/2013 0832      Component Value Date/Time   CALCIUM 9.4 06/26/2015 1132   CALCIUM 8.4* 04/12/2015 0835   ALKPHOS 56 06/26/2015 1132   ALKPHOS 56 02/11/2015 0308   AST 18 06/26/2015 1132   AST 22 02/11/2015 0308   ALT 17 06/26/2015 1132   ALT 21 02/11/2015 0308   BILITOT 0.36 06/26/2015 1132   BILITOT 1.3* 02/11/2015 0308       RADIOGRAPHIC STUDIES: No results found.  ASSESSMENT AND PLAN: This is a very pleasant 64 years old never smoker white female recently diagnosed with stage IV non-small cell lung cancer, adenocarcinoma with positive EGFR mutation with deletion in exon 19 presented with large right upper lobe lung mass in addition to bilateral mediastinal, supraclavicular lymphadenopathy as well as bone and multiple brain metastasis diagnosed in June 2016.  The patient completed whole brain irradiation under the care of Dr. Lisbeth Renshaw. She  is currently undergoing treatment with Gilotrif 40 mg by mouth daily and she is tolerating her treatment well.  I recommended for the patient to continue on her current treatment with the same dose for now. For the metastatic bone disease, I continue the patient on Xgeva 120 mg subcutaneously on monthly basis. The patient was also advised to increased her intake of calcium supplement and vitamin D.. For the nausea, she will continue on Zyprexa 10 mg by mouth daily at bedtime.  The patient would come back for follow-up visit in one month for reevaluation after repeating CT scan of the chest, abdomen and pelvis for restaging of her disease.  She was advised to call immediately if she has any concerning symptoms in the interval. The patient voices understanding of current disease status and treatment options and is in agreement with the current care plan.  All questions were answered. The patient knows to call the clinic  with any problems, questions or concerns. We can certainly see the patient much sooner if necessary.  Disclaimer: This note was dictated with voice recognition software. Similar sounding words can inadvertently be transcribed and may not be corrected upon review.

## 2015-06-26 NOTE — Progress Notes (Signed)
Oncology Nurse Navigator Documentation  Oncology Nurse Navigator Flowsheets 06/26/2015  Referral date to RadOnc/MedOnc -  Navigator Encounter Type Treatment/spoke with patient and friend today at [facility]. She is doing well.  Her nausea is controled with new medication.  Laura Walter looks good today and states she feels good. No barriers identified.   Patient Visit Type Medonc;Follow-up  Treatment Phase Treatment  Barriers/Navigation Needs No barriers at this time  Education -  Interventions -  Education Method -  Support Groups/Services -  Time Spent with Patient 15

## 2015-07-04 ENCOUNTER — Encounter (HOSPITAL_COMMUNITY): Payer: Self-pay

## 2015-07-04 ENCOUNTER — Ambulatory Visit (HOSPITAL_COMMUNITY)
Admission: RE | Admit: 2015-07-04 | Discharge: 2015-07-04 | Disposition: A | Discharge status: Home / Self Care | Payer: BC Managed Care – PPO | Source: Ambulatory Visit | Attending: Internal Medicine | Admitting: Internal Medicine

## 2015-07-04 DIAGNOSIS — C7951 Secondary malignant neoplasm of bone: Secondary | ICD-10-CM | POA: Diagnosis present

## 2015-07-04 DIAGNOSIS — I728 Aneurysm of other specified arteries: Secondary | ICD-10-CM | POA: Insufficient documentation

## 2015-07-04 DIAGNOSIS — C3491 Malignant neoplasm of unspecified part of right bronchus or lung: Secondary | ICD-10-CM | POA: Diagnosis present

## 2015-07-04 DIAGNOSIS — R918 Other nonspecific abnormal finding of lung field: Secondary | ICD-10-CM | POA: Insufficient documentation

## 2015-07-04 MED ORDER — IOHEXOL 300 MG/ML  SOLN
100.0000 mL | Freq: Once | INTRAMUSCULAR | Status: AC | PRN
  Administered 2015-07-04: 100 mL via INTRAVENOUS

## 2015-07-10 ENCOUNTER — Other Ambulatory Visit: Payer: Self-pay | Admitting: Medical Oncology

## 2015-07-10 DIAGNOSIS — L03031 Cellulitis of right toe: Secondary | ICD-10-CM

## 2015-07-10 MED ORDER — DOXYCYCLINE HYCLATE 100 MG PO TABS: 100.0000 mg | ORAL_TABLET | Freq: Two times a day (BID) | ORAL | Status: DC

## 2015-07-10 NOTE — Progress Notes (Signed)
[facility] called about persistent redness, peeling and pain to touch on right great toe. Per Laura Walter I instructed nruse to tell pt to apply antibiotic ointment and her prescribed antibiotic to local pharmacy.

## 2015-07-21 ENCOUNTER — Other Ambulatory Visit: Payer: Self-pay | Admitting: Internal Medicine

## 2015-07-21 ENCOUNTER — Other Ambulatory Visit (HOSPITAL_BASED_OUTPATIENT_CLINIC_OR_DEPARTMENT_OTHER): Payer: BC Managed Care – PPO

## 2015-07-21 DIAGNOSIS — C7951 Secondary malignant neoplasm of bone: Secondary | ICD-10-CM

## 2015-07-21 DIAGNOSIS — C3491 Malignant neoplasm of unspecified part of right bronchus or lung: Secondary | ICD-10-CM

## 2015-07-21 LAB — CBC WITH DIFFERENTIAL/PLATELET
BASO%: 0.6 % (ref 0.0–2.0)
Basophils Absolute: 0 10*3/uL (ref 0.0–0.1)
EOS ABS: 0.1 10*3/uL (ref 0.0–0.5)
EOS%: 3.7 % (ref 0.0–7.0)
HCT: 35 % (ref 34.8–46.6)
HEMOGLOBIN: 11.5 g/dL — AB (ref 11.6–15.9)
LYMPH%: 15.3 % (ref 14.0–49.7)
MCH: 32.8 pg (ref 25.1–34.0)
MCHC: 33 g/dL (ref 31.5–36.0)
MCV: 99.6 fL (ref 79.5–101.0)
MONO#: 0.4 10*3/uL (ref 0.1–0.9)
MONO%: 10.5 % (ref 0.0–14.0)
NEUT%: 69.9 % (ref 38.4–76.8)
NEUTROS ABS: 2.5 10*3/uL (ref 1.5–6.5)
PLATELETS: 197 10*3/uL (ref 145–400)
RBC: 3.52 10*6/uL — ABNORMAL LOW (ref 3.70–5.45)
RDW: 13.4 % (ref 11.2–14.5)
WBC: 3.5 10*3/uL — AB (ref 3.9–10.3)
lymph#: 0.5 10*3/uL — ABNORMAL LOW (ref 0.9–3.3)

## 2015-07-21 LAB — COMPREHENSIVE METABOLIC PANEL
ALBUMIN: 3.5 g/dL (ref 3.5–5.0)
ALK PHOS: 57 U/L (ref 40–150)
ALT: 12 U/L (ref 0–55)
ANION GAP: 8 meq/L (ref 3–11)
AST: 16 U/L (ref 5–34)
BILIRUBIN TOTAL: 0.42 mg/dL (ref 0.20–1.20)
BUN: 35.7 mg/dL — ABNORMAL HIGH (ref 7.0–26.0)
CO2: 25 mEq/L (ref 22–29)
Calcium: 9 mg/dL (ref 8.4–10.4)
Chloride: 110 mEq/L — ABNORMAL HIGH (ref 98–109)
Creatinine: 0.9 mg/dL (ref 0.6–1.1)
EGFR: 73 mL/min/{1.73_m2} — AB (ref >90)
Glucose: 99 mg/dl (ref 70–140)
Potassium: 3.8 mEq/L (ref 3.5–5.1)
Sodium: 142 mEq/L (ref 136–145)
TOTAL PROTEIN: 6.8 g/dL (ref 6.4–8.3)

## 2015-07-26 ENCOUNTER — Ambulatory Visit (HOSPITAL_BASED_OUTPATIENT_CLINIC_OR_DEPARTMENT_OTHER): Payer: BC Managed Care – PPO | Admitting: Internal Medicine

## 2015-07-26 ENCOUNTER — Encounter: Payer: Self-pay | Admitting: Internal Medicine

## 2015-07-26 ENCOUNTER — Telehealth: Payer: Self-pay | Admitting: Internal Medicine

## 2015-07-26 ENCOUNTER — Ambulatory Visit (HOSPITAL_BASED_OUTPATIENT_CLINIC_OR_DEPARTMENT_OTHER): Payer: BC Managed Care – PPO

## 2015-07-26 VITALS — BP 115/64 | HR 97 | Temp 97.0°F | Resp 18 | Ht 67.0 in | Wt 123.7 lb

## 2015-07-26 DIAGNOSIS — C778 Secondary and unspecified malignant neoplasm of lymph nodes of multiple regions: Secondary | ICD-10-CM

## 2015-07-26 DIAGNOSIS — Z5111 Encounter for antineoplastic chemotherapy: Secondary | ICD-10-CM

## 2015-07-26 DIAGNOSIS — R110 Nausea: Secondary | ICD-10-CM

## 2015-07-26 DIAGNOSIS — C3411 Malignant neoplasm of upper lobe, right bronchus or lung: Secondary | ICD-10-CM | POA: Diagnosis not present

## 2015-07-26 DIAGNOSIS — E46 Unspecified protein-calorie malnutrition: Secondary | ICD-10-CM

## 2015-07-26 DIAGNOSIS — C7931 Secondary malignant neoplasm of brain: Secondary | ICD-10-CM

## 2015-07-26 DIAGNOSIS — C3491 Malignant neoplasm of unspecified part of right bronchus or lung: Secondary | ICD-10-CM

## 2015-07-26 DIAGNOSIS — C7951 Secondary malignant neoplasm of bone: Secondary | ICD-10-CM

## 2015-07-26 MED ORDER — DENOSUMAB 120 MG/1.7ML ~~LOC~~ SOLN
120.0000 mg | Freq: Once | SUBCUTANEOUS | Status: AC
  Administered 2015-07-26: 120 mg via SUBCUTANEOUS
  Filled 2015-07-26: qty 1.7

## 2015-07-26 NOTE — Patient Instructions (Signed)
Denosumab injection What is this medicine? DENOSUMAB (den oh sue mab) slows bone breakdown. Prolia is used to treat osteoporosis in women after menopause and in men. Xgeva is used to prevent bone fractures and other bone problems caused by cancer bone metastases. Xgeva is also used to treat giant cell tumor of the bone. This medicine may be used for other purposes; ask your health care provider or pharmacist if you have questions. COMMON BRAND NAME(S): Prolia, XGEVA What should I tell my health care provider before I take this medicine? They need to know if you have any of these conditions: -dental disease -eczema -infection or history of infections -kidney disease or on dialysis -low blood calcium or vitamin D -malabsorption syndrome -scheduled to have surgery or tooth extraction -taking medicine that contains denosumab -thyroid or parathyroid disease -an unusual reaction to denosumab, other medicines, foods, dyes, or preservatives -pregnant or trying to get pregnant -breast-feeding How should I use this medicine? This medicine is for injection under the skin. It is given by a health care professional in a hospital or clinic setting. If you are getting Prolia, a special MedGuide will be given to you by the pharmacist with each prescription and refill. Be sure to read this information carefully each time. For Prolia, talk to your pediatrician regarding the use of this medicine in children. Special care may be needed. For Xgeva, talk to your pediatrician regarding the use of this medicine in children. While this drug may be prescribed for children as young as 13 years for selected conditions, precautions do apply. Overdosage: If you think you've taken too much of this medicine contact a poison control center or emergency room at once. Overdosage: If you think you have taken too much of this medicine contact a poison control center or emergency room at once. NOTE: This medicine is only for  you. Do not share this medicine with others. What if I miss a dose? It is important not to miss your dose. Call your doctor or health care professional if you are unable to keep an appointment. What may interact with this medicine? Do not take this medicine with any of the following medications: -other medicines containing denosumab This medicine may also interact with the following medications: -medicines that suppress the immune system -medicines that treat cancer -steroid medicines like prednisone or cortisone This list may not describe all possible interactions. Give your health care provider a list of all the medicines, herbs, non-prescription drugs, or dietary supplements you use. Also tell them if you smoke, drink alcohol, or use illegal drugs. Some items may interact with your medicine. What should I watch for while using this medicine? Visit your doctor or health care professional for regular checks on your progress. Your doctor or health care professional may order blood tests and other tests to see how you are doing. Call your doctor or health care professional if you get a cold or other infection while receiving this medicine. Do not treat yourself. This medicine may decrease your body's ability to fight infection. You should make sure you get enough calcium and vitamin D while you are taking this medicine, unless your doctor tells you not to. Discuss the foods you eat and the vitamins you take with your health care professional. See your dentist regularly. Brush and floss your teeth as directed. Before you have any dental work done, tell your dentist you are receiving this medicine. Do not become pregnant while taking this medicine or for 5 months after stopping   it. Women should inform their doctor if they wish to become pregnant or think they might be pregnant. There is a potential for serious side effects to an unborn child. Talk to your health care professional or pharmacist for more  information. What side effects may I notice from receiving this medicine? Side effects that you should report to your doctor or health care professional as soon as possible: -allergic reactions like skin rash, itching or hives, swelling of the face, lips, or tongue -breathing problems -chest pain -fast, irregular heartbeat -feeling faint or lightheaded, falls -fever, chills, or any other sign of infection -muscle spasms, tightening, or twitches -numbness or tingling -skin blisters or bumps, or is dry, peels, or red -slow healing or unexplained pain in the mouth or jaw -unusual bleeding or bruising Side effects that usually do not require medical attention (Report these to your doctor or health care professional if they continue or are bothersome.): -muscle pain -stomach upset, gas This list may not describe all possible side effects. Call your doctor for medical advice about side effects. You may report side effects to FDA at 1-800-FDA-1088. Where should I keep my medicine? This medicine is only given in a clinic, doctor's office, or other health care setting and will not be stored at home. NOTE: This sheet is a summary. It may not cover all possible information. If you have questions about this medicine, talk to your doctor, pharmacist, or health care provider.  2015, Elsevier/Gold Standard. (2012-01-20 12:37:47)  

## 2015-07-26 NOTE — Progress Notes (Signed)
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 Lisbeth Renshaw  CURRENT THERAPY:  1) Gilotrif 40 mg by mouth daily. Status post 4 months of treatment. 2) Xgeva 120 mg subcutaneously every 4 weeks. First dose 05/01/2015.  INTERVAL HISTORY: Laura Walter 64 y.o. female returns to the clinic today for follow-up visit accompanied by her partner. The patient is feeling much better except for mild fatigue and few pounds of weight loss since her last visit. She has no significant nausea since she started treatment with Zyprexa. She denied having any significant chest pain, shortness of breath, cough or hemoptysis. She has been tolerating her treatment with Gilotrif 40 mg by mouth daily fairly well with fairly minimal skin rash and diarrhea. She has repeat CBC and comprehensive metabolic panel performed earlier today as well as CT scan of the chest, abdomen and pelvis recently and she is here for evaluation and discussion of her lab results.  MEDICAL HISTORY: Past Medical History  Diagnosis Date  . Pneumonia 2009 & 2011  . Allergy     seasonal  . H/O reactive hypoglycemia   . Osteopenia   . IBS (irritable bowel syndrome)   . Bronchitis   . S/P radiation therapy completed 02/08/15    whole brain ,spine  . Brain cancer ([facility]) 01/13/15    MRI multiple small brain mets 10 individual lesions  . Bone cancer ([facility]) 01/10/15 PET    lytic lesion lt 1st rib,T9,L3 Lt acetabulum    ALLERGIES:  is allergic to sulfonamide  derivatives.  MEDICATIONS:  Current Outpatient Prescriptions  Medication Sig Dispense Refill  . Amino Acids-Protein Hydrolys (FEEDING SUPPLEMENT, PRO-STAT SUGAR FREE 64,) LIQD Take 30 mLs by mouth daily. 900 mL 0  . doxycycline (VIBRA-TABS) 100 MG tablet Take 1 tablet (100 mg total) by mouth 2 (two) times daily. 14 tablet 0  . feeding supplement, ENSURE ENLIVE, (ENSURE ENLIVE) LIQD Take 237 mLs by mouth 2 (two) times daily between meals. 237 mL 12  . GILOTRIF 40 MG tablet TAKE 1 TABLET BY MOUTH ONCE DAILY. TAKE ON AN EMPTY STOMACH 1 HOUR BEFORE OR 2 HOURS AFTER MEALS 30 tablet 2  . HYDROcodone-homatropine (HYCODAN) 5-1.5 MG/5ML syrup Take 5 mLs by mouth every 6 (six) hours as needed for cough. 120 mL 0  . ibuprofen (ADVIL,MOTRIN) 200 MG tablet Take 200 mg by mouth every 6 (six) hours as needed for moderate pain.    Marland Kitchen loperamide (IMODIUM) 2 MG capsule Take 2 mg by mouth as needed for diarrhea or loose stools (Up to 8 times daily, if needed.).    Marland Kitchen Nutritional Supplements (FEEDING SUPPLEMENT, OSMOLITE 1.5 CAL,) LIQD Place 237 mLs into feeding tube 4 (four) times daily. 958 mL 12  . OLANZapine (ZYPREXA) 10 MG tablet TAKE 1 TABLET (10 MG TOTAL) BY MOUTH AT BEDTIME. 30 tablet 1  . ondansetron (ZOFRAN) 8 MG tablet Take 1 tablet (8 mg total) by mouth every 8 (eight) hours as needed for nausea or vomiting. 20 tablet 3  . pantoprazole (PROTONIX)  40 MG tablet Take 1 tablet by mouth daily.    . prochlorperazine (COMPAZINE) 10 MG tablet Take 1 tablet (10 mg total) by mouth every 6 (six) hours as needed for nausea or vomiting. 30 tablet 1   No current facility-administered medications for this visit.    SURGICAL HISTORY:  Past Surgical History  Procedure Laterality Date  . Colonoscopy  2009    Negative; [facility]  . Tonsillectomy and adenoidectomy    . Wisdom tooth extraction    . Esophagogastroduodenoscopy (egd) with propofol N/A 04/11/2015    Procedure: ESOPHAGOGASTRODUODENOSCOPY (EGD) WITH  PROPOFOL;  Surgeon: Carol Ada, MD;  [facility];  Service: Endoscopy;  Laterality: N/A;  . Peg placement N/A 04/11/2015    Procedure: PERCUTANEOUS ENDOSCOPIC GASTROSTOMY (PEG) PLACEMENT;  Surgeon: Carol Ada, MD;  [facility];  Service: Endoscopy;  Laterality: N/A;    REVIEW OF SYSTEMS:  Constitutional: positive for anorexia and weight loss Eyes: negative Ears, nose, mouth, throat, and face: negative Respiratory: negative Cardiovascular: negative Gastrointestinal: negative Genitourinary:negative Integument/breast: negative Hematologic/lymphatic: negative Musculoskeletal:negative Neurological: negative Behavioral/Psych: negative Endocrine: negative Allergic/Immunologic: negative   PHYSICAL EXAMINATION: General appearance: alert, cooperative, flushed and no distress Head: Normocephalic, without obvious abnormality, atraumatic Neck: no adenopathy, no JVD, supple, symmetrical, trachea midline and thyroid not enlarged, symmetric, no tenderness/mass/nodules Lymph nodes: Cervical, supraclavicular, and axillary nodes normal. Resp: clear to auscultation bilaterally Back: Tenderness to palpation at the lower thoracic and lumbar vertebrae. Cardio: regular rate and rhythm, S1, S2 normal, no murmur, click, rub or gallop GI: soft, non-tender; bowel sounds normal; no masses,  no organomegaly Extremities: extremities normal, atraumatic, no cyanosis or edema Neurologic: Alert and oriented X 3, normal strength and tone. Normal symmetric reflexes. Normal coordination and gait  ECOG PERFORMANCE STATUS: 1 - Symptomatic but completely ambulatory  Blood pressure 115/64, pulse 97, temperature 97 F (36.1 C), resp. rate 18, height 5' 7"  (1.702 m), weight 123 lb 11.2 oz (56.11 kg), SpO2 100 %.  LABORATORY DATA: Lab Results  Component Value Date   WBC 3.5* 07/21/2015   HGB 11.5* 07/21/2015   HCT 35.0 07/21/2015   MCV 99.6 07/21/2015   PLT 197 07/21/2015      Chemistry       Component Value Date/Time   NA 142 07/21/2015 1111   NA 137 04/12/2015 0835   K 3.8 07/21/2015 1111   K 3.7 04/12/2015 0835   CL 108 04/12/2015 0835   CO2 25 07/21/2015 1111   CO2 22 04/12/2015 0835   BUN 35.7* 07/21/2015 1111   BUN 17 04/12/2015 0835   CREATININE 0.9 07/21/2015 1111   CREATININE 0.81 04/12/2015 0835   CREATININE 0.83 11/02/2013 0832      Component Value Date/Time   CALCIUM 9.0 07/21/2015 1111   CALCIUM 8.4* 04/12/2015 0835   ALKPHOS 57 07/21/2015 1111   ALKPHOS 56 02/11/2015 0308   AST 16 07/21/2015 1111   AST 22 02/11/2015 0308   ALT 12 07/21/2015 1111   ALT 21 02/11/2015 0308   BILITOT 0.42 07/21/2015 1111   BILITOT 1.3* 02/11/2015 0308       RADIOGRAPHIC STUDIES: Ct Chest W Contrast  07/04/2015  CLINICAL DATA:  Right-sided lung cancer diagnosed in 2016. Chemotherapy ongoing. Fatigue. Bone metastasis. Restaging. EXAM: CT CHEST, ABDOMEN, AND PELVIS WITH CONTRAST TECHNIQUE: Multidetector CT imaging of the chest, abdomen and pelvis was performed following the standard protocol during bolus administration of intravenous contrast. CONTRAST:  167m OMNIPAQUE IOHEXOL 300 MG/ML  SOLN COMPARISON:  04/21/2015 FINDINGS:  CT CHEST FINDINGS Mediastinum/Nodes: No supraclavicular adenopathy. Tortuous descending thoracic aorta. Normal heart size with trace pericardial fluid or thickening, likely physiologic. No central pulmonary embolism, on this non-dedicated study. No mediastinal or hilar adenopathy. The right hilar node measures 8 mm on image 26/series 2 versus 13 mm on the prior. Lungs/Pleura: No pleural fluid. Right upper lobe pulmonary nodule measures 2.5 x 1.9 cm on image 18/ series 4. Similar to minimally decreased from 2.6 x 2.0 cm on the prior exam. New dependent right lower lobe airspace and ground-glass opacity is mild on image 43/series 4. Musculoskeletal: Redemonstration of extensive osseous metastasis. Example sclerotic lesion within the sternal manubrium, similar.  Mild pathologic fracture of the T9 vertebral body is similar. CT ABDOMEN PELVIS FINDINGS Hepatobiliary: Hepatic cysts. Mild hepatic steatosis suspected. Normal gallbladder, without biliary ductal dilatation. Pancreas: Mild pancreatic atrophy, especially involving the head and uncinate process. No duct dilatation or acute inflammation. Spleen: Normal in size, without focal abnormality. Adrenals/Urinary Tract: Normal adrenal glands. Normal kidneys, without hydronephrosis. Normal urinary bladder. Stomach/Bowel: The gastrostomy tube is unchanged and appropriately positioned. Stomach otherwise normal. Fluid-filled colon again identified. Normal terminal ileum and appendix. Normal small bowel. Vascular/Lymphatic: Aortic atherosclerosis. 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 This is unchanged. No abdominopelvic adenopathy. Reproductive: Normal uterus and adnexa. Other: No significant free fluid. Mild to moderate pelvic floor laxity. No evidence of omental or peritoneal disease. Musculoskeletal: Osseous metastasis again identified with pathologic fracture of the left inferior pubic ramus. Mild osteopenia. Index sclerotic lesion in the right iliac measures 6 mm and is unchanged. Similar osseous involvement of L2 and L3 with superior endplate pathologic compression fracture at L2. IMPRESSION: 1. Similar to minimal decrease in size of a right upper lobe pulmonary nodule. Resolved right hilar adenopathy. 2. Similar osseous metastasis. 3. No progressive extraosseous metastasis. 4. Fluid-filled colon, again suggesting a diarrheal state. 5. Similar splenic artery aneurysm. 6. New ground-glass and airspace disease in dependent right lower lobe. Correlate with infectious symptoms. Atelectasis possible but felt less likely. Electronically Signed   By: Abigail Miyamoto M.D.   On: 07/04/2015 09:40   Ct Abdomen Pelvis W Contrast  07/04/2015  CLINICAL DATA:  Right-sided lung cancer diagnosed in 2016.  Chemotherapy ongoing. Fatigue. Bone metastasis. Restaging. EXAM: CT CHEST, ABDOMEN, AND PELVIS WITH CONTRAST TECHNIQUE: Multidetector CT imaging of the chest, abdomen and pelvis was performed following the standard protocol during bolus administration of intravenous contrast. CONTRAST:  117m OMNIPAQUE IOHEXOL 300 MG/ML  SOLN COMPARISON:  04/21/2015 FINDINGS: CT CHEST FINDINGS Mediastinum/Nodes: No supraclavicular adenopathy. Tortuous descending thoracic aorta. Normal heart size with trace pericardial fluid or thickening, likely physiologic. No central pulmonary embolism, on this non-dedicated study. No mediastinal or hilar adenopathy. The right hilar node measures 8 mm on image 26/series 2 versus 13 mm on the prior. Lungs/Pleura: No pleural fluid. Right upper lobe pulmonary nodule measures 2.5 x 1.9 cm on image 18/ series 4. Similar to minimally decreased from 2.6 x 2.0 cm on the prior exam. New dependent right lower lobe airspace and ground-glass opacity is mild on image 43/series 4. Musculoskeletal: Redemonstration of extensive osseous metastasis. Example sclerotic lesion within the sternal manubrium, similar. Mild pathologic fracture of the T9 vertebral body is similar. CT ABDOMEN PELVIS FINDINGS Hepatobiliary: Hepatic cysts. Mild hepatic steatosis suspected. Normal gallbladder, without biliary ductal dilatation. Pancreas: Mild pancreatic atrophy, especially involving the head and uncinate process. No duct dilatation or acute inflammation. Spleen: Normal in size, without focal abnormality. Adrenals/Urinary  Tract: Normal adrenal glands. Normal kidneys, without hydronephrosis. Normal urinary bladder. Stomach/Bowel: The gastrostomy tube is unchanged and appropriately positioned. Stomach otherwise normal. Fluid-filled colon again identified. Normal terminal ileum and appendix. Normal small bowel. Vascular/Lymphatic: Aortic atherosclerosis. 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 This  is unchanged. No abdominopelvic adenopathy. Reproductive: Normal uterus and adnexa. Other: No significant free fluid. Mild to moderate pelvic floor laxity. No evidence of omental or peritoneal disease. Musculoskeletal: Osseous metastasis again identified with pathologic fracture of the left inferior pubic ramus. Mild osteopenia. Index sclerotic lesion in the right iliac measures 6 mm and is unchanged. Similar osseous involvement of L2 and L3 with superior endplate pathologic compression fracture at L2. IMPRESSION: 1. Similar to minimal decrease in size of a right upper lobe pulmonary nodule. Resolved right hilar adenopathy. 2. Similar osseous metastasis. 3. No progressive extraosseous metastasis. 4. Fluid-filled colon, again suggesting a diarrheal state. 5. Similar splenic artery aneurysm. 6. New ground-glass and airspace disease in dependent right lower lobe. Correlate with infectious symptoms. Atelectasis possible but felt less likely. Electronically Signed   By: Abigail Miyamoto M.D.   On: 07/04/2015 09:40    ASSESSMENT AND PLAN: This is a very pleasant 64 years old never smoker white female recently diagnosed with stage IV non-small cell lung cancer, adenocarcinoma with positive EGFR mutation with deletion in exon 19 presented with large right upper lobe lung mass in addition to bilateral mediastinal, supraclavicular lymphadenopathy as well as bone and multiple brain metastasis diagnosed in June 2016.  The patient completed whole brain irradiation under the care of Dr. Lisbeth Renshaw. She is currently undergoing treatment with Gilotrif 40 mg by mouth daily and she is tolerating her treatment well.  The recent CT scan of the chest, abdomen and pelvis showed no evidence for disease progression. I discussed the scan results with the patient and her partner. I recommended for the patient to continue on her current treatment with the same dose for now. For the metastatic bone disease, I continue the patient on Xgeva 120  mg subcutaneously on monthly basis. The patient was also advised to increased her intake of calcium supplement and vitamin D. For the nausea, she will continue on Zyprexa 10 mg by mouth daily at bedtime.  The patient would come back for follow-up visit in one month with repeat blood work. She was advised to call immediately if she has any concerning symptoms in the interval. The patient voices understanding of current disease status and treatment options and is in agreement with the current care plan.  All questions were answered. The patient knows to call the clinic with any problems, questions or concerns. We can certainly see the patient much sooner if necessary.  Disclaimer: This note was dictated with voice recognition software. Similar sounding words can inadvertently be transcribed and may not be corrected upon review.

## 2015-07-26 NOTE — Telephone Encounter (Signed)
Gave pt appt for 08/21/15.

## 2015-08-21 ENCOUNTER — Ambulatory Visit (HOSPITAL_BASED_OUTPATIENT_CLINIC_OR_DEPARTMENT_OTHER): Payer: BC Managed Care – PPO | Admitting: Internal Medicine

## 2015-08-21 ENCOUNTER — Telehealth: Payer: Self-pay | Admitting: Internal Medicine

## 2015-08-21 ENCOUNTER — Encounter: Payer: Self-pay | Admitting: Internal Medicine

## 2015-08-21 ENCOUNTER — Other Ambulatory Visit (HOSPITAL_BASED_OUTPATIENT_CLINIC_OR_DEPARTMENT_OTHER): Payer: BC Managed Care – PPO

## 2015-08-21 ENCOUNTER — Ambulatory Visit (HOSPITAL_BASED_OUTPATIENT_CLINIC_OR_DEPARTMENT_OTHER): Payer: BC Managed Care – PPO

## 2015-08-21 VITALS — BP 121/78 | HR 89 | Resp 17 | Ht 67.0 in | Wt 126.8 lb

## 2015-08-21 DIAGNOSIS — R21 Rash and other nonspecific skin eruption: Secondary | ICD-10-CM

## 2015-08-21 DIAGNOSIS — L03031 Cellulitis of right toe: Secondary | ICD-10-CM

## 2015-08-21 DIAGNOSIS — C3411 Malignant neoplasm of upper lobe, right bronchus or lung: Secondary | ICD-10-CM | POA: Diagnosis not present

## 2015-08-21 DIAGNOSIS — C3491 Malignant neoplasm of unspecified part of right bronchus or lung: Secondary | ICD-10-CM

## 2015-08-21 DIAGNOSIS — Z5111 Encounter for antineoplastic chemotherapy: Secondary | ICD-10-CM

## 2015-08-21 DIAGNOSIS — C7951 Secondary malignant neoplasm of bone: Secondary | ICD-10-CM | POA: Diagnosis not present

## 2015-08-21 DIAGNOSIS — C778 Secondary and unspecified malignant neoplasm of lymph nodes of multiple regions: Secondary | ICD-10-CM

## 2015-08-21 DIAGNOSIS — L03032 Cellulitis of left toe: Secondary | ICD-10-CM

## 2015-08-21 DIAGNOSIS — C3490 Malignant neoplasm of unspecified part of unspecified bronchus or lung: Secondary | ICD-10-CM

## 2015-08-21 DIAGNOSIS — C7931 Secondary malignant neoplasm of brain: Secondary | ICD-10-CM

## 2015-08-21 DIAGNOSIS — E46 Unspecified protein-calorie malnutrition: Secondary | ICD-10-CM

## 2015-08-21 DIAGNOSIS — R110 Nausea: Secondary | ICD-10-CM

## 2015-08-21 LAB — CBC WITH DIFFERENTIAL/PLATELET
BASO%: 0.6 % (ref 0.0–2.0)
Basophils Absolute: 0 10*3/uL (ref 0.0–0.1)
EOS ABS: 0.3 10*3/uL (ref 0.0–0.5)
EOS%: 5.6 % (ref 0.0–7.0)
HCT: 35.4 % (ref 34.8–46.6)
HGB: 11.8 g/dL (ref 11.6–15.9)
LYMPH%: 11.8 % — AB (ref 14.0–49.7)
MCH: 33.1 pg (ref 25.1–34.0)
MCHC: 33.3 g/dL (ref 31.5–36.0)
MCV: 99.4 fL (ref 79.5–101.0)
MONO#: 0.5 10*3/uL (ref 0.1–0.9)
MONO%: 9.9 % (ref 0.0–14.0)
NEUT#: 3.5 10*3/uL (ref 1.5–6.5)
NEUT%: 72.1 % (ref 38.4–76.8)
Platelets: 215 10*3/uL (ref 145–400)
RBC: 3.56 10*6/uL — AB (ref 3.70–5.45)
RDW: 12.6 % (ref 11.2–14.5)
WBC: 4.8 10*3/uL (ref 3.9–10.3)
lymph#: 0.6 10*3/uL — ABNORMAL LOW (ref 0.9–3.3)

## 2015-08-21 LAB — COMPREHENSIVE METABOLIC PANEL
ALK PHOS: 56 U/L (ref 40–150)
ALT: 10 U/L (ref 0–55)
ANION GAP: 8 meq/L (ref 3–11)
AST: 15 U/L (ref 5–34)
Albumin: 3.4 g/dL — ABNORMAL LOW (ref 3.5–5.0)
BUN: 28 mg/dL — ABNORMAL HIGH (ref 7.0–26.0)
CALCIUM: 9 mg/dL (ref 8.4–10.4)
CHLORIDE: 108 meq/L (ref 98–109)
CO2: 26 mEq/L (ref 22–29)
CREATININE: 0.8 mg/dL (ref 0.6–1.1)
EGFR: 77 mL/min/{1.73_m2} — AB (ref >90)
Glucose: 83 mg/dl (ref 70–140)
Potassium: 4.3 mEq/L (ref 3.5–5.1)
Sodium: 142 mEq/L (ref 136–145)
TOTAL PROTEIN: 6.8 g/dL (ref 6.4–8.3)

## 2015-08-21 MED ORDER — DOXYCYCLINE HYCLATE 100 MG PO TABS: 100.0000 mg | ORAL_TABLET | Freq: Two times a day (BID) | ORAL | Status: DC

## 2015-08-21 MED ORDER — AFATINIB DIMALEATE 30 MG PO TABS: 30.0000 mg | ORAL_TABLET | Freq: Every day | ORAL | Status: DC

## 2015-08-21 MED ORDER — DENOSUMAB 120 MG/1.7ML ~~LOC~~ SOLN
120.0000 mg | Freq: Once | SUBCUTANEOUS | Status: AC
  Administered 2015-08-21: 120 mg via SUBCUTANEOUS
  Filled 2015-08-21: qty 1.7

## 2015-08-21 MED FILL — DOXYCYCLINE HYCLATE 100 MG: 100 | 10 days supply | Qty: 20 | Fill #0

## 2015-08-21 MED FILL — *GILOTRIF 30 MG TABLET: 30 | 30 days supply | Qty: 30 | Fill #0

## 2015-08-21 NOTE — Telephone Encounter (Signed)
Gave relative avs report and appointments for February.

## 2015-08-21 NOTE — Progress Notes (Signed)
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 Lisbeth Renshaw  CURRENT THERAPY:  1) Gilotrif 40 mg by mouth daily. Status post 5 months of treatment. 2) Xgeva 120 mg subcutaneously every 4 weeks. First dose 05/01/2015.  INTERVAL HISTORY: Laura Walter 65 y.o. female returns to the clinic today for follow-up visit accompanied by her partner. The patient is feeling much better except for mild fatigue, paronychia of the big toes as well as skin rash in the sacral area. She gained 3 pounds since her last visit. She denied having any significant nausea or vomiting. She denied having any significant chest pain, shortness of breath, cough or hemoptysis. She has been tolerating her treatment with Gilotrif 40 mg by mouth daily fairly well with fairly except for the skin rash, paronychia and 2 episodes of diarrhea daily improved with Imodium. She has repeat CBC and comprehensive metabolic panel performed earlier today and she is here for evaluation and discussion of her lab results.  MEDICAL HISTORY: Past Medical History  Diagnosis Date  . Pneumonia 2009 & 2011  . Allergy     seasonal  . H/O reactive hypoglycemia   . Osteopenia   . IBS (irritable bowel syndrome)   . Bronchitis   . S/P radiation therapy completed 02/08/15    whole brain ,spine  . Brain cancer ([facility]) 01/13/15    MRI multiple small brain mets 10 individual lesions  . Bone cancer ([facility]) 01/10/15 PET    lytic lesion lt 1st rib,T9,L3 Lt acetabulum     ALLERGIES:  is allergic to sulfonamide derivatives.  MEDICATIONS:  Current Outpatient Prescriptions  Medication Sig Dispense Refill  . Amino Acids-Protein Hydrolys (FEEDING SUPPLEMENT, PRO-STAT SUGAR FREE 64,) LIQD Take 30 mLs by mouth daily. (Patient not taking: Reported on 07/26/2015) 900 mL 0  . feeding supplement, ENSURE ENLIVE, (ENSURE ENLIVE) LIQD Take 237 mLs by mouth 2 (two) times daily between meals. 237 mL 12  . GILOTRIF 40 MG tablet TAKE 1 TABLET BY MOUTH ONCE DAILY. TAKE ON AN EMPTY STOMACH 1 HOUR BEFORE OR 2 HOURS AFTER MEALS 30 tablet 2  . HYDROcodone-homatropine (HYCODAN) 5-1.5 MG/5ML syrup Take 5 mLs by mouth every 6 (six) hours as needed for cough. (Patient not taking: Reported on 07/26/2015) 120 mL 0  . ibuprofen (ADVIL,MOTRIN) 200 MG tablet Take 200 mg by mouth every 6 (six) hours as needed for moderate pain.    Marland Kitchen loperamide (IMODIUM) 2 MG capsule Take 2 mg by mouth as needed for diarrhea or loose stools (Up to 8 times daily, if needed.).    Marland Kitchen Nutritional Supplements (FEEDING SUPPLEMENT, OSMOLITE 1.5 CAL,) LIQD Place 237 mLs into feeding tube 4 (four) times daily. (Patient not taking: Reported on 07/26/2015) 958 mL 12  . OLANZapine (ZYPREXA) 10 MG tablet TAKE 1 TABLET (10 MG TOTAL) BY MOUTH AT BEDTIME. 30 tablet 1  . ondansetron (ZOFRAN) 8 MG tablet Take 1 tablet (8 mg total) by mouth every 8 (eight) hours as needed for nausea or vomiting. (Patient not taking: Reported  on 07/26/2015) 20 tablet 3  . pantoprazole (PROTONIX) 40 MG tablet Take 1 tablet by mouth daily.     No current facility-administered medications for this visit.    SURGICAL HISTORY:  Past Surgical History  Procedure Laterality Date  . Colonoscopy  2009    Negative; [facility]  . Tonsillectomy and adenoidectomy    . Wisdom tooth extraction    . Esophagogastroduodenoscopy (egd) with propofol N/A 04/11/2015    Procedure: ESOPHAGOGASTRODUODENOSCOPY (EGD) WITH PROPOFOL;  Surgeon: Carol Ada, MD;   Location: [facility];  Service: Endoscopy;  Laterality: N/A;  . Peg placement N/A 04/11/2015    Procedure: PERCUTANEOUS ENDOSCOPIC GASTROSTOMY (PEG) PLACEMENT;  Surgeon: Carol Ada, MD;  Location: [facility];  Service: Endoscopy;  Laterality: N/A;    REVIEW OF SYSTEMS:  Constitutional: positive for fatigue Eyes: negative Ears, nose, mouth, throat, and face: negative Respiratory: negative Cardiovascular: negative Gastrointestinal: negative Genitourinary:negative Integument/breast: positive for dryness, rash and Paronychia of the big toes Hematologic/lymphatic: negative Musculoskeletal:negative Neurological: negative Behavioral/Psych: negative Endocrine: negative Allergic/Immunologic: negative   PHYSICAL EXAMINATION: General appearance: alert, cooperative, flushed and no distress Head: Normocephalic, without obvious abnormality, atraumatic Neck: no adenopathy, no JVD, supple, symmetrical, trachea midline and thyroid not enlarged, symmetric, no tenderness/mass/nodules Lymph nodes: Cervical, supraclavicular, and axillary nodes normal. Resp: clear to auscultation bilaterally Back: Tenderness to palpation at the lower thoracic and lumbar vertebrae. Cardio: regular rate and rhythm, S1, S2 normal, no murmur, click, rub or gallop GI: soft, non-tender; bowel sounds normal; no masses,  no organomegaly Extremities: extremities normal, atraumatic, no cyanosis or edema Neurologic: Alert and oriented X 3, normal strength and tone. Normal symmetric reflexes. Normal coordination and gait  Skin exam: Significant rash in the sacral area in addition to paronychia the big toes bilaterally.      ECOG PERFORMANCE STATUS: 1 - Symptomatic but completely ambulatory  Blood pressure 121/78, pulse 89, resp. rate 17, height 5' 7"  (1.702 m), weight 126 lb 12.8 oz (57.516 kg), SpO2 98 %.  LABORATORY DATA: Lab Results  Component Value Date   WBC 4.8 08/21/2015   HGB 11.8 08/21/2015   HCT 35.4  08/21/2015   MCV 99.4 08/21/2015   PLT 215 08/21/2015      Chemistry      Component Value Date/Time   NA 142 07/21/2015 1111   NA 137 04/12/2015 0835   K 3.8 07/21/2015 1111   K 3.7 04/12/2015 0835   CL 108 04/12/2015 0835   CO2 25 07/21/2015 1111   CO2 22 04/12/2015 0835   BUN 35.7* 07/21/2015 1111   BUN 17 04/12/2015 0835   CREATININE 0.9 07/21/2015 1111   CREATININE 0.81 04/12/2015 0835   CREATININE 0.83 11/02/2013 0832      Component Value Date/Time   CALCIUM 9.0 07/21/2015 1111   CALCIUM 8.4* 04/12/2015 0835   ALKPHOS 57 07/21/2015 1111   ALKPHOS 56 02/11/2015 0308   AST 16 07/21/2015 1111   AST 22 02/11/2015 0308   ALT 12 07/21/2015 1111   ALT 21 02/11/2015 0308   BILITOT 0.42 07/21/2015 1111   BILITOT 1.3* 02/11/2015 0308       RADIOGRAPHIC STUDIES: No results found.  ASSESSMENT AND PLAN: This is a very pleasant 65 years old never smoker white female recently diagnosed with stage IV non-small cell lung cancer, adenocarcinoma with positive EGFR mutation with deletion in exon 19 presented with large right upper lobe lung mass in addition to bilateral mediastinal, supraclavicular lymphadenopathy as well as bone and multiple brain metastasis diagnosed  in June 2016.  The patient completed whole brain irradiation under the care of Dr. Lisbeth Renshaw. She is currently undergoing treatment with Gilotrif 40 mg by mouth daily status post 5 months but she started having more adverse effects including paronychia of the big toes in addition to sacral skin rash as well as few episodes of diarrhea. I recommended for the patient to hold her treatment with Gilotrif for at least one week for improvement of her skin rash. I will reduce the dose of Gilotrif to 30 mg by mouth daily when she starts her treatment. For the sacral skin rash, the patient will continue with the clindamycin lotion and antifungal powder. For the paronychia, I started the patient on doxycycline 100 mg by mouth twice a  day and I advised her to see her podiatrist for evaluation and management of this condition. For the metastatic bone disease, I continue the patient on Xgeva 120 mg subcutaneously on monthly basis. The patient was also advised to increased her intake of calcium supplement and vitamin D. For the nausea, she will continue on Zyprexa 10 mg by mouth daily at bedtime.  The patient would come back for follow-up visit in one month with repeat blood work. She was advised to call immediately if she has any concerning symptoms in the interval. The patient voices understanding of current disease status and treatment options and is in agreement with the current care plan.  All questions were answered. The patient knows to call the clinic with any problems, questions or concerns. We can certainly see the patient much sooner if necessary.  Disclaimer: This note was dictated with voice recognition software. Similar sounding words can inadvertently be transcribed and may not be corrected upon review.  Paronychia

## 2015-09-20 ENCOUNTER — Other Ambulatory Visit (HOSPITAL_BASED_OUTPATIENT_CLINIC_OR_DEPARTMENT_OTHER): Payer: BC Managed Care – PPO

## 2015-09-20 ENCOUNTER — Telehealth: Payer: Self-pay | Admitting: Internal Medicine

## 2015-09-20 ENCOUNTER — Ambulatory Visit (HOSPITAL_BASED_OUTPATIENT_CLINIC_OR_DEPARTMENT_OTHER): Payer: BC Managed Care – PPO | Admitting: Internal Medicine

## 2015-09-20 ENCOUNTER — Encounter: Payer: Self-pay | Admitting: Internal Medicine

## 2015-09-20 ENCOUNTER — Ambulatory Visit (HOSPITAL_BASED_OUTPATIENT_CLINIC_OR_DEPARTMENT_OTHER): Payer: BC Managed Care – PPO

## 2015-09-20 VITALS — BP 113/67 | HR 78 | Temp 97.5°F | Resp 18 | Ht 67.0 in | Wt 128.2 lb

## 2015-09-20 DIAGNOSIS — L03031 Cellulitis of right toe: Secondary | ICD-10-CM

## 2015-09-20 DIAGNOSIS — C3411 Malignant neoplasm of upper lobe, right bronchus or lung: Secondary | ICD-10-CM

## 2015-09-20 DIAGNOSIS — C7951 Secondary malignant neoplasm of bone: Secondary | ICD-10-CM

## 2015-09-20 DIAGNOSIS — C7931 Secondary malignant neoplasm of brain: Secondary | ICD-10-CM

## 2015-09-20 DIAGNOSIS — C3491 Malignant neoplasm of unspecified part of right bronchus or lung: Secondary | ICD-10-CM

## 2015-09-20 DIAGNOSIS — R110 Nausea: Secondary | ICD-10-CM

## 2015-09-20 DIAGNOSIS — R21 Rash and other nonspecific skin eruption: Secondary | ICD-10-CM | POA: Diagnosis not present

## 2015-09-20 DIAGNOSIS — E46 Unspecified protein-calorie malnutrition: Secondary | ICD-10-CM

## 2015-09-20 DIAGNOSIS — L03032 Cellulitis of left toe: Secondary | ICD-10-CM

## 2015-09-20 DIAGNOSIS — Z5111 Encounter for antineoplastic chemotherapy: Secondary | ICD-10-CM

## 2015-09-20 LAB — COMPREHENSIVE METABOLIC PANEL
ALT: 11 U/L (ref 0–55)
AST: 16 U/L (ref 5–34)
Albumin: 3.3 g/dL — ABNORMAL LOW (ref 3.5–5.0)
Alkaline Phosphatase: 59 U/L (ref 40–150)
Anion Gap: 7 mEq/L (ref 3–11)
BUN: 26.4 mg/dL — ABNORMAL HIGH (ref 7.0–26.0)
CO2: 26 meq/L (ref 22–29)
Calcium: 9 mg/dL (ref 8.4–10.4)
Chloride: 108 mEq/L (ref 98–109)
Creatinine: 0.9 mg/dL (ref 0.6–1.1)
EGFR: 73 mL/min/{1.73_m2} — AB (ref >90)
GLUCOSE: 79 mg/dL (ref 70–140)
POTASSIUM: 4.5 meq/L (ref 3.5–5.1)
SODIUM: 142 meq/L (ref 136–145)
Total Bilirubin: 0.32 mg/dL (ref 0.20–1.20)
Total Protein: 6.6 g/dL (ref 6.4–8.3)

## 2015-09-20 LAB — CBC WITH DIFFERENTIAL/PLATELET
BASO%: 0.6 % (ref 0.0–2.0)
BASOS ABS: 0 10*3/uL (ref 0.0–0.1)
EOS ABS: 0.2 10*3/uL (ref 0.0–0.5)
EOS%: 4 % (ref 0.0–7.0)
HCT: 35.8 % (ref 34.8–46.6)
HGB: 11.8 g/dL (ref 11.6–15.9)
LYMPH%: 14.9 % (ref 14.0–49.7)
MCH: 33.1 pg (ref 25.1–34.0)
MCHC: 33 g/dL (ref 31.5–36.0)
MCV: 100.3 fL (ref 79.5–101.0)
MONO#: 0.5 10*3/uL (ref 0.1–0.9)
MONO%: 10.5 % (ref 0.0–14.0)
NEUT%: 70 % (ref 38.4–76.8)
NEUTROS ABS: 3.5 10*3/uL (ref 1.5–6.5)
Platelets: 177 10*3/uL (ref 145–400)
RBC: 3.57 10*6/uL — AB (ref 3.70–5.45)
RDW: 12.4 % (ref 11.2–14.5)
WBC: 5.1 10*3/uL (ref 3.9–10.3)
lymph#: 0.8 10*3/uL — ABNORMAL LOW (ref 0.9–3.3)

## 2015-09-20 MED ORDER — DENOSUMAB 120 MG/1.7ML ~~LOC~~ SOLN
120.0000 mg | Freq: Once | SUBCUTANEOUS | Status: AC
  Administered 2015-09-20: 120 mg via SUBCUTANEOUS
  Filled 2015-09-20: qty 1.7

## 2015-09-20 NOTE — Telephone Encounter (Signed)
per pof to sch pt appt-gave pt copy of avs °

## 2015-09-20 NOTE — Progress Notes (Signed)
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 Lisbeth Renshaw. 2) Gilotrif 40 mg by mouth daily. Status post 5 months of treatment.  CURRENT THERAPY:  1) Gilotrif 30 mg by mouth daily. First dose started 08/31/2015. 2) Xgeva 120 mg subcutaneously every 4 weeks. First dose 05/01/2015.  INTERVAL HISTORY: Laura Walter 65 y.o. female returns to the clinic today for follow-up visit accompanied by her partner. The patient is feeling much better after she had her Gilotrif for10 days and started with the lower dose 30 mg by mouth daily on 08/31/2015. She gained 2 more pounds since her last visit. Her paronychia, sacral skin rash and fatigue had improved. She denied having any significant nausea or vomiting. She denied having any significant chest pain, shortness of breath, cough or hemoptysis. She has repeat CBC and comprehensive metabolic panel performed earlier today and she is here for evaluation and discussion of her lab results.  MEDICAL HISTORY: Past Medical History  Diagnosis Date  . Pneumonia 2009 & 2011  . Allergy     seasonal  . H/O reactive hypoglycemia   . Osteopenia   . IBS (irritable bowel syndrome)   . Bronchitis   . S/P radiation therapy completed 02/08/15    whole brain ,spine  . Brain cancer ([facility]) 01/13/15    MRI multiple small brain mets 10 individual lesions  . Bone cancer ([facility]) 01/10/15 PET    lytic lesion lt 1st rib,T9,L3 Lt acetabulum    ALLERGIES:  is allergic to sulfonamide  derivatives.  MEDICATIONS:  Current Outpatient Prescriptions  Medication Sig Dispense Refill  . afatinib dimaleate (GILOTRIF) 30 MG tablet Take 1 tablet (30 mg total) by mouth daily. Take on an empty stomach 1hr before or 2 hrs after meals. 30 tablet 2  . Amino Acids-Protein Hydrolys (FEEDING SUPPLEMENT, PRO-STAT SUGAR FREE 64,) LIQD Take 30 mLs by mouth daily. 900 mL 0  . doxycycline (VIBRA-TABS) 100 MG tablet Take 1 tablet (100 mg total) by mouth 2 (two) times daily. 20 tablet 0  . feeding supplement, ENSURE ENLIVE, (ENSURE ENLIVE) LIQD Take 237 mLs by mouth 2 (two) times daily between meals. 237 mL 12  . HYDROcodone-homatropine (HYCODAN) 5-1.5 MG/5ML syrup Take 5 mLs by mouth every 6 (six) hours as needed for cough. (Patient not taking: Reported on 07/26/2015) 120 mL 0  . ibuprofen (ADVIL,MOTRIN) 200 MG tablet Take 200 mg by mouth every 6 (six) hours as needed for moderate pain.    Marland Kitchen loperamide (IMODIUM) 2 MG capsule Take 2 mg by mouth as needed for diarrhea or loose stools (Up to 8 times daily, if needed.).    Marland Kitchen Nutritional Supplements (FEEDING SUPPLEMENT, OSMOLITE 1.5 CAL,) LIQD Place 237 mLs into feeding tube 4 (four) times daily. (Patient not taking: Reported on 07/26/2015) 958 mL 12  . OLANZapine (ZYPREXA) 10 MG tablet TAKE 1 TABLET (10 MG TOTAL) BY MOUTH AT BEDTIME. 30 tablet 1  . ondansetron (ZOFRAN) 8 MG tablet Take 1 tablet (8 mg total) by mouth every 8 (  eight) hours as needed for nausea or vomiting. (Patient not taking: Reported on 07/26/2015) 20 tablet 3  . OVER THE COUNTER MEDICATION     . pantoprazole (PROTONIX) 40 MG tablet Take 1 tablet by mouth daily.     No current facility-administered medications for this visit.    SURGICAL HISTORY:  Past Surgical History  Procedure Laterality Date  . Colonoscopy  2009    Negative; [facility]  . Tonsillectomy and adenoidectomy    . Wisdom tooth extraction    . Esophagogastroduodenoscopy (egd) with propofol N/A 04/11/2015    Procedure:  ESOPHAGOGASTRODUODENOSCOPY (EGD) WITH PROPOFOL;  Surgeon: Carol Ada, MD;  Location: [facility];  Service: Endoscopy;  Laterality: N/A;  . Peg placement N/A 04/11/2015    Procedure: PERCUTANEOUS ENDOSCOPIC GASTROSTOMY (PEG) PLACEMENT;  Surgeon: Carol Ada, MD;  Location: [facility];  Service: Endoscopy;  Laterality: N/A;    REVIEW OF SYSTEMS:  A comprehensive review of systems was negative except for: Constitutional: positive for fatigue   PHYSICAL EXAMINATION: General appearance: alert, cooperative, flushed and no distress Head: Normocephalic, without obvious abnormality, atraumatic Neck: no adenopathy, no JVD, supple, symmetrical, trachea midline and thyroid not enlarged, symmetric, no tenderness/mass/nodules Lymph nodes: Cervical, supraclavicular, and axillary nodes normal. Resp: clear to auscultation bilaterally Back: Tenderness to palpation at the lower thoracic and lumbar vertebrae. Cardio: regular rate and rhythm, S1, S2 normal, no murmur, click, rub or gallop GI: soft, non-tender; bowel sounds normal; no masses,  no organomegaly Extremities: extremities normal, atraumatic, no cyanosis or edema Neurologic: Alert and oriented X 3, normal strength and tone. Normal symmetric reflexes. Normal coordination and gait    ECOG PERFORMANCE STATUS: 1 - Symptomatic but completely ambulatory  Blood pressure 113/67, pulse 78, temperature 97.5 F (36.4 C), temperature source Oral, resp. rate 18, height _0  (1.702 m), weight 128 lb 3.2 oz (58.151 kg), SpO2 100 %.  LABORATORY DATA: Lab Results  Component Value Date   WBC 5.1 09/20/2015   HGB 11.8 09/20/2015   HCT 35.8 09/20/2015   MCV 100.3 09/20/2015   PLT 177 09/20/2015      Chemistry      Component Value Date/Time   NA 142 09/20/2015 1323   NA 137 04/12/2015 0835   K 4.5 09/20/2015 1323   K 3.7 04/12/2015 0835   CL 108 04/12/2015 0835   CO2 26 09/20/2015 1323   CO2 22 04/12/2015 0835   BUN 26.4* 09/20/2015 1323   BUN 17  04/12/2015 0835   CREATININE 0.9 09/20/2015 1323   CREATININE 0.81 04/12/2015 0835   CREATININE 0.83 11/02/2013 0832      Component Value Date/Time   CALCIUM 9.0 09/20/2015 1323   CALCIUM 8.4* 04/12/2015 0835   ALKPHOS 59 09/20/2015 1323   ALKPHOS 56 02/11/2015 0308   AST 16 09/20/2015 1323   AST 22 02/11/2015 0308   ALT 11 09/20/2015 1323   ALT 21 02/11/2015 0308   BILITOT 0.32 09/20/2015 1323   BILITOT 1.3* 02/11/2015 0308       RADIOGRAPHIC STUDIES: No results found.  ASSESSMENT AND PLAN: This is a very pleasant 65 years old never smoker white female recently diagnosed with stage IV non-small cell lung cancer, adenocarcinoma with positive EGFR mutation with deletion in exon 19 presented with large right upper lobe lung mass in addition to bilateral mediastinal, supraclavicular lymphadenopathy as well as bone and multiple brain metastasis diagnosed in June 2016.  The patient completed whole brain irradiation under the care of Dr. Lisbeth Renshaw. She was started  on treatment with Gilotrif 40 mg by mouth daily status post 5 months but this was discontinued secondary to intolerance with significant bony contact, skin rash in the sacral area as well as few episodes of diarrhea  I reduced her dose of Gilotrif 30 mg by mouth daily started on 08/31/2015. She is tolerating it much better with the reduced dose was less adverse effects. I recommended for the patient to continue Gilotrif with the same dose for now. She will come back for follow-up visit in one month's for reevaluation after repeating CT scan of the chest, abdomen and pelvis for restaging of her disease. For the metastatic bone disease, I continue the patient on Xgeva 120 mg subcutaneously on monthly basis. The patient was also advised to increased her intake of calcium supplement and vitamin D. For the nausea, she will continue on Zyprexa 10 mg by mouth daily at bedtime.  The patient would come back for follow-up visit in one month with  repeat blood work. She was advised to call immediately if she has any concerning symptoms in the interval. The patient voices understanding of current disease status and treatment options and is in agreement with the current care plan.  All questions were answered. The patient knows to call the clinic with any problems, questions or concerns. We can certainly see the patient much sooner if necessary.  Disclaimer: This note was dictated with voice recognition software. Similar sounding words can inadvertently be transcribed and may not be corrected upon review.  Paronychia

## 2015-09-21 ENCOUNTER — Ambulatory Visit: Payer: BC Managed Care – PPO

## 2015-09-22 ENCOUNTER — Other Ambulatory Visit: Payer: Self-pay | Admitting: Internal Medicine

## 2015-09-28 MED FILL — *GILOTRIF 30 MG TABLET: 30 | 30 days supply | Qty: 30 | Fill #1

## 2015-10-16 ENCOUNTER — Other Ambulatory Visit (HOSPITAL_BASED_OUTPATIENT_CLINIC_OR_DEPARTMENT_OTHER): Payer: BC Managed Care – PPO

## 2015-10-16 ENCOUNTER — Ambulatory Visit (HOSPITAL_COMMUNITY)
Admission: RE | Admit: 2015-10-16 | Discharge: 2015-10-16 | Disposition: A | Discharge status: Home / Self Care | Payer: BC Managed Care – PPO | Source: Ambulatory Visit | Attending: Internal Medicine | Admitting: Internal Medicine

## 2015-10-16 ENCOUNTER — Encounter (HOSPITAL_COMMUNITY): Payer: Self-pay

## 2015-10-16 DIAGNOSIS — C7951 Secondary malignant neoplasm of bone: Secondary | ICD-10-CM

## 2015-10-16 DIAGNOSIS — R918 Other nonspecific abnormal finding of lung field: Secondary | ICD-10-CM | POA: Insufficient documentation

## 2015-10-16 DIAGNOSIS — C3491 Malignant neoplasm of unspecified part of right bronchus or lung: Secondary | ICD-10-CM | POA: Diagnosis not present

## 2015-10-16 LAB — CBC WITH DIFFERENTIAL/PLATELET
BASO%: 0.7 % (ref 0.0–2.0)
Basophils Absolute: 0 10*3/uL (ref 0.0–0.1)
EOS ABS: 0.2 10*3/uL (ref 0.0–0.5)
EOS%: 4.8 % (ref 0.0–7.0)
HEMATOCRIT: 34.5 % — AB (ref 34.8–46.6)
HEMOGLOBIN: 11.5 g/dL — AB (ref 11.6–15.9)
LYMPH#: 0.5 10*3/uL — AB (ref 0.9–3.3)
LYMPH%: 9.6 % — ABNORMAL LOW (ref 14.0–49.7)
MCH: 32.5 pg (ref 25.1–34.0)
MCHC: 33.4 g/dL (ref 31.5–36.0)
MCV: 97.3 fL (ref 79.5–101.0)
MONO#: 0.5 10*3/uL (ref 0.1–0.9)
MONO%: 9.7 % (ref 0.0–14.0)
NEUT%: 75.2 % (ref 38.4–76.8)
NEUTROS ABS: 3.7 10*3/uL (ref 1.5–6.5)
Platelets: 203 10*3/uL (ref 145–400)
RBC: 3.55 10*6/uL — ABNORMAL LOW (ref 3.70–5.45)
RDW: 12.2 % (ref 11.2–14.5)
WBC: 5 10*3/uL (ref 3.9–10.3)

## 2015-10-16 LAB — COMPREHENSIVE METABOLIC PANEL
ALBUMIN: 3.2 g/dL — AB (ref 3.5–5.0)
ALK PHOS: 53 U/L (ref 40–150)
ALT: 12 U/L (ref 0–55)
AST: 15 U/L (ref 5–34)
Anion Gap: 7 mEq/L (ref 3–11)
BILIRUBIN TOTAL: 0.32 mg/dL (ref 0.20–1.20)
BUN: 19.5 mg/dL (ref 7.0–26.0)
CALCIUM: 8.7 mg/dL (ref 8.4–10.4)
CO2: 25 mEq/L (ref 22–29)
Chloride: 107 mEq/L (ref 98–109)
Creatinine: 0.8 mg/dL (ref 0.6–1.1)
EGFR: 74 mL/min/{1.73_m2} — AB (ref >90)
GLUCOSE: 83 mg/dL (ref 70–140)
Potassium: 4.1 mEq/L (ref 3.5–5.1)
SODIUM: 139 meq/L (ref 136–145)
TOTAL PROTEIN: 6.7 g/dL (ref 6.4–8.3)

## 2015-10-16 MED ORDER — IOHEXOL 300 MG/ML  SOLN
100.0000 mL | Freq: Once | INTRAMUSCULAR | Status: AC | PRN
  Administered 2015-10-16: 80 mL via INTRAVENOUS

## 2015-10-17 ENCOUNTER — Encounter: Payer: BC Managed Care – PPO | Admitting: Family Medicine

## 2015-10-19 ENCOUNTER — Ambulatory Visit (HOSPITAL_BASED_OUTPATIENT_CLINIC_OR_DEPARTMENT_OTHER): Payer: BC Managed Care – PPO

## 2015-10-19 ENCOUNTER — Encounter: Payer: Self-pay | Admitting: Internal Medicine

## 2015-10-19 ENCOUNTER — Ambulatory Visit (HOSPITAL_BASED_OUTPATIENT_CLINIC_OR_DEPARTMENT_OTHER): Payer: BC Managed Care – PPO | Admitting: Internal Medicine

## 2015-10-19 ENCOUNTER — Telehealth: Payer: Self-pay | Admitting: Internal Medicine

## 2015-10-19 VITALS — BP 120/65 | HR 92 | Temp 96.7°F | Resp 18 | Ht 67.0 in | Wt 129.6 lb

## 2015-10-19 DIAGNOSIS — C778 Secondary and unspecified malignant neoplasm of lymph nodes of multiple regions: Secondary | ICD-10-CM | POA: Diagnosis not present

## 2015-10-19 DIAGNOSIS — C7951 Secondary malignant neoplasm of bone: Secondary | ICD-10-CM

## 2015-10-19 DIAGNOSIS — C3411 Malignant neoplasm of upper lobe, right bronchus or lung: Secondary | ICD-10-CM

## 2015-10-19 DIAGNOSIS — C3491 Malignant neoplasm of unspecified part of right bronchus or lung: Secondary | ICD-10-CM

## 2015-10-19 DIAGNOSIS — E46 Unspecified protein-calorie malnutrition: Secondary | ICD-10-CM

## 2015-10-19 DIAGNOSIS — Z5111 Encounter for antineoplastic chemotherapy: Secondary | ICD-10-CM

## 2015-10-19 DIAGNOSIS — R110 Nausea: Secondary | ICD-10-CM

## 2015-10-19 DIAGNOSIS — C7931 Secondary malignant neoplasm of brain: Secondary | ICD-10-CM

## 2015-10-19 MED ORDER — DENOSUMAB 120 MG/1.7ML ~~LOC~~ SOLN
120.0000 mg | Freq: Once | SUBCUTANEOUS | Status: AC
  Administered 2015-10-19: 120 mg via SUBCUTANEOUS
  Filled 2015-10-19: qty 1.7

## 2015-10-19 NOTE — Progress Notes (Signed)
[facility] [phone]   [phone]  OFFICE PROGRESS NOTE  [name], MD [address] [place] [address]  DIAGNOSIS: Stage IV (T2a, N3, M1b) Non-small lung cancer, adenocarcinoma with positive EGFR mutation with deletion in exon 19, presented with large right upper lobe lung mass in addition to bilateral mediastinal and left supraclavicular lymphadenopathy as well as multiple bone and brain lesions diagnosed in June 2016  PRIOR THERAPY:  1) Whole brain irradiation under the care of Dr. Lisbeth Renshaw. 2) Gilotrif 40 mg by mouth daily. Status post 5 months of treatment.  CURRENT THERAPY:  1) Gilotrif 30 mg by mouth daily. First dose started 08/31/2015. Status post 6 weeks of treatment. 2) Xgeva 120 mg subcutaneously every 4 weeks. First dose 05/01/2015.  INTERVAL HISTORY: Laura Walter 65 y.o. female returns to the clinic today for follow-up visit accompanied by her partner. The patient is feeling much better after she is currently on Gilotrif 30 mg by mouth daily for the last 6 weeks. She is tolerating her treatment well except for persistent paronychia which improved when she was treated with doxycycline. She denied having any significant weight loss or night sweats. She denied having any significant nausea or vomiting. She denied having any significant chest pain, shortness of breath, cough or hemoptysis. She has repeat CBC and comprehensive metabolic panel as well as CT scan of the chest, abdomen and pelvis performed recently and she is here for evaluation and discussion of her lab results.  MEDICAL HISTORY: Past Medical History  Diagnosis Date  . Pneumonia 2009 & 2011  . Allergy     seasonal  . H/O reactive hypoglycemia   . Osteopenia   . IBS (irritable bowel syndrome)   . Bronchitis   . S/P radiation therapy completed 02/08/15    whole brain ,spine  . Brain cancer ([facility]) 01/13/15    MRI multiple small brain mets 10 individual  lesions  . Bone cancer ([facility]) 01/10/15 PET    lytic lesion lt 1st rib,T9,L3 Lt acetabulum    ALLERGIES:  is allergic to sulfonamide derivatives.  MEDICATIONS:  Current Outpatient Prescriptions  Medication Sig Dispense Refill  . afatinib dimaleate (GILOTRIF) 30 MG tablet Take 1 tablet (30 mg total) by mouth daily. Take on an empty stomach 1hr before or 2 hrs after meals. 30 tablet 2  . Amino Acids-Protein Hydrolys (FEEDING SUPPLEMENT, PRO-STAT SUGAR FREE 64,) LIQD Take 30 mLs by mouth daily. 900 mL 0  . feeding supplement, ENSURE ENLIVE, (ENSURE ENLIVE) LIQD Take 237 mLs by mouth 2 (two) times daily between meals. 237 mL 12  . Nutritional Supplements (FEEDING SUPPLEMENT, OSMOLITE 1.5 CAL,) LIQD Place 237 mLs into feeding tube 4 (four) times daily. 958 mL 12  . OLANZapine (ZYPREXA) 10 MG tablet TAKE 1 TABLET (10 MG TOTAL) BY MOUTH AT BEDTIME. 30 tablet 1  . HYDROcodone-homatropine (HYCODAN) 5-1.5 MG/5ML syrup Take 5 mLs by mouth every 6 (six) hours as needed for cough. (Patient not taking: Reported on 07/26/2015) 120 mL 0  . ibuprofen (ADVIL,MOTRIN) 200 MG tablet Take 200 mg by mouth every 6 (six) hours as needed for moderate pain. Reported on 10/19/2015    . loperamide (IMODIUM) 2 MG capsule Take 2 mg by mouth as needed for diarrhea or loose stools (Up to 8 times daily, if needed.). Reported on 10/19/2015    . ondansetron (ZOFRAN) 8 MG tablet Take 1 tablet (8 mg total) by mouth every 8 (  eight) hours as needed for nausea or vomiting. (Patient not taking: Reported on 07/26/2015) 20 tablet 3  . OVER THE COUNTER MEDICATION Reported on 10/19/2015    . pantoprazole (PROTONIX) 40 MG tablet Take 1 tablet by mouth daily.     No current facility-administered medications for this visit.    SURGICAL HISTORY:  Past Surgical History  Procedure Laterality Date  . Colonoscopy  2009    Negative;  GI  . Tonsillectomy and adenoidectomy    . Wisdom tooth extraction    . Esophagogastroduodenoscopy (egd)  with propofol N/A 04/11/2015    Procedure: ESOPHAGOGASTRODUODENOSCOPY (EGD) WITH PROPOFOL;  Surgeon: Carol Ada, MD;  Location: [facility];  Service: Endoscopy;  Laterality: N/A;  . Peg placement N/A 04/11/2015    Procedure: PERCUTANEOUS ENDOSCOPIC GASTROSTOMY (PEG) PLACEMENT;  Surgeon: Carol Ada, MD;  Location: [facility];  Service: Endoscopy;  Laterality: N/A;    REVIEW OF SYSTEMS:  Constitutional: positive for fatigue Eyes: negative Ears, nose, mouth, throat, and face: negative Respiratory: negative Cardiovascular: negative Gastrointestinal: negative Genitourinary:negative Integument/breast: negative Hematologic/lymphatic: negative Musculoskeletal:negative Neurological: negative Behavioral/Psych: negative Endocrine: negative Allergic/Immunologic: negative   PHYSICAL EXAMINATION: General appearance: alert, cooperative, flushed and no distress Head: Normocephalic, without obvious abnormality, atraumatic Neck: no adenopathy, no JVD, supple, symmetrical, trachea midline and thyroid not enlarged, symmetric, no tenderness/mass/nodules Lymph nodes: Cervical, supraclavicular, and axillary nodes normal. Resp: clear to auscultation bilaterally Back: Tenderness to palpation at the lower thoracic and lumbar vertebrae. Cardio: regular rate and rhythm, S1, S2 normal, no murmur, click, rub or gallop GI: soft, non-tender; bowel sounds normal; no masses,  no organomegaly Extremities: extremities normal, atraumatic, no cyanosis or edema Neurologic: Alert and oriented X 3, normal strength and tone. Normal symmetric reflexes. Normal coordination and gait    ECOG PERFORMANCE STATUS: 1 - Symptomatic but completely ambulatory  Blood pressure 120/65, pulse 92, temperature 96.7 F (35.9 C), temperature source Axillary, resp. rate 18, height _0  (1.702 m), weight 129 lb 9.6 oz (58.786 kg), SpO2 100 %.  LABORATORY DATA: Lab Results  Component Value Date   WBC 5.0 10/16/2015   HGB 11.5*  10/16/2015   HCT 34.5* 10/16/2015   MCV 97.3 10/16/2015   PLT 203 10/16/2015      Chemistry      Component Value Date/Time   NA 139 10/16/2015 1118   NA 137 04/12/2015 0835   K 4.1 10/16/2015 1118   K 3.7 04/12/2015 0835   CL 108 04/12/2015 0835   CO2 25 10/16/2015 1118   CO2 22 04/12/2015 0835   BUN 19.5 10/16/2015 1118   BUN 17 04/12/2015 0835   CREATININE 0.8 10/16/2015 1118   CREATININE 0.81 04/12/2015 0835   CREATININE 0.83 11/02/2013 0832      Component Value Date/Time   CALCIUM 8.7 10/16/2015 1118   CALCIUM 8.4* 04/12/2015 0835   ALKPHOS 53 10/16/2015 1118   ALKPHOS 56 02/11/2015 0308   AST 15 10/16/2015 1118   AST 22 02/11/2015 0308   ALT 12 10/16/2015 1118   ALT 21 02/11/2015 0308   BILITOT 0.32 10/16/2015 1118   BILITOT 1.3* 02/11/2015 0308       RADIOGRAPHIC STUDIES: Ct Chest W Contrast  10/16/2015  CLINICAL DATA:  Patient history of metastatic right lung cancer. On chemotherapy. Radiation therapy. EXAM: CT CHEST, ABDOMEN, AND PELVIS WITH CONTRAST TECHNIQUE: Multidetector CT imaging of the chest, abdomen and pelvis was performed following the standard protocol during bolus administration of intravenous contrast. CONTRAST:  29m OMNIPAQUE IOHEXOL 300 MG/ML  SOLN COMPARISON:  CT CAP 07/04/2015 FINDINGS: CT CHEST FINDINGS Mediastinum/Lymph Nodes: Visualized thyroid is unremarkable. No enlarged axillary, mediastinal or hilar lymphadenopathy. Normal heart size. Trace pericardial fluid. Lungs/Pleura: Central airways are patent. Interval increase in size of 4.4 x 4.3 cm right upper lobe mass (image 18; series 4), previously measuring 2.5 x 1.9 cm. Interval worsening of ground-glass opacities involving the right lower lobe with more focal consolidation within the subpleural medial right lower lobe (image 41; series 4). No pleural effusion or pneumothorax. Musculoskeletal: Re- demonstrated pathologic fracture involving the T9 vertebral body. Otherwise grossly unchanged  extensive osseous metastasis. CT ABDOMEN PELVIS FINDINGS Hepatobiliary: Re- demonstrated hepatic cysts. No new or enlarging hepatic lesions. Gallbladder is unremarkable. No intrahepatic or extrahepatic biliary ductal dilatation. Pancreas: Unremarkable Spleen: Unremarkable Adrenals/Urinary Tract: The adrenal glands are normal. Kidneys enhance symmetrically with contrast. No hydronephrosis. Urinary bladder is unremarkable. Stomach/Bowel: Stool is present throughout the colon. PEG tube is in place and appears in appropriate position. Vascular/Lymphatic: Tortuous yet normal caliber abdominal aorta. No retroperitoneal lymphadenopathy. Suggestion of apparent wall thickening within the cecum (image 106; series 2) is favored to be secondary to stool. 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 Other: Uterus is unremarkable. Musculoskeletal: Re- demonstrated pathologic fracture of the inferior left pubic ramus. Unchanged 6 mm sclerotic lesion were right ilium. Similar appearance of the L2 and L3 vertebral bodies with associated compression deformity. IMPRESSION: Interval increase in size of right upper lobe mass, most compatible with progressive disease. Interval worsening of right lower lobe ground-glass opacities with more focal consolidation within the medial right lower lobe. Findings may represent post treatment/radiation changes. Infectious or inflammatory process or malignancy are not excluded. Electronically Signed   By: Lovey Newcomer M.D.   On: 10/16/2015 15:55   Ct Abdomen Pelvis W Contrast  10/16/2015  CLINICAL DATA:  Patient history of metastatic right lung cancer. On chemotherapy. Radiation therapy. EXAM: CT CHEST, ABDOMEN, AND PELVIS WITH CONTRAST TECHNIQUE: Multidetector CT imaging of the chest, abdomen and pelvis was performed following the standard protocol during bolus administration of intravenous contrast. CONTRAST:  19m OMNIPAQUE IOHEXOL 300 MG/ML  SOLN COMPARISON:  CT CAP 07/04/2015  FINDINGS: CT CHEST FINDINGS Mediastinum/Lymph Nodes: Visualized thyroid is unremarkable. No enlarged axillary, mediastinal or hilar lymphadenopathy. Normal heart size. Trace pericardial fluid. Lungs/Pleura: Central airways are patent. Interval increase in size of 4.4 x 4.3 cm right upper lobe mass (image 18; series 4), previously measuring 2.5 x 1.9 cm. Interval worsening of ground-glass opacities involving the right lower lobe with more focal consolidation within the subpleural medial right lower lobe (image 41; series 4). No pleural effusion or pneumothorax. Musculoskeletal: Re- demonstrated pathologic fracture involving the T9 vertebral body. Otherwise grossly unchanged extensive osseous metastasis. CT ABDOMEN PELVIS FINDINGS Hepatobiliary: Re- demonstrated hepatic cysts. No new or enlarging hepatic lesions. Gallbladder is unremarkable. No intrahepatic or extrahepatic biliary ductal dilatation. Pancreas: Unremarkable Spleen: Unremarkable Adrenals/Urinary Tract: The adrenal glands are normal. Kidneys enhance symmetrically with contrast. No hydronephrosis. Urinary bladder is unremarkable. Stomach/Bowel: Stool is present throughout the colon. PEG tube is in place and appears in appropriate position. Vascular/Lymphatic: Tortuous yet normal caliber abdominal aorta. No retroperitoneal lymphadenopathy. Suggestion of apparent wall thickening within the cecum (image 106; series 2) is favored to be secondary to stool. 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 Other: Uterus is unremarkable. Musculoskeletal: Re- demonstrated pathologic fracture of the inferior left pubic ramus. Unchanged 6 mm sclerotic lesion were right ilium. Similar appearance of  the L2 and L3 vertebral bodies with associated compression deformity. IMPRESSION: Interval increase in size of right upper lobe mass, most compatible with progressive disease. Interval worsening of right lower lobe ground-glass opacities with more focal  consolidation within the medial right lower lobe. Findings may represent post treatment/radiation changes. Infectious or inflammatory process or malignancy are not excluded. Electronically Signed   By: Lovey Newcomer M.D.   On: 10/16/2015 15:55    ASSESSMENT AND PLAN: This is a very pleasant 65 years old never smoker white female recently diagnosed with stage IV non-small cell lung cancer, adenocarcinoma with positive EGFR mutation with deletion in exon 19 presented with large right upper lobe lung mass in addition to bilateral mediastinal, supraclavicular lymphadenopathy as well as bone and multiple brain metastasis diagnosed in June 2016.  The patient completed whole brain irradiation under the care of Dr. Lisbeth Renshaw. She was started on treatment with Gilotrif 40 mg by mouth daily status post 5 months but this was discontinued secondary to intolerance with significant bony contact, skin rash in the sacral area as well as few episodes of diarrhea  The patient was started on Gilotrif 30 mg by mouth daily since 08/31/2015. She is tolerating it much better with the reduced dose was less adverse effects. Unfortunately the recent CT scan of the chest, abdomen and pelvis showed interval increase in the size of the right upper lobe lung mass compatible with disease progression. I discussed the scan results and showed the images to the patient and her partner today. I recommended for the patient to see Dr. Lisbeth Renshaw for consideration of stereotactic radiotherapy to the enlarging right upper lobe lung mass. I also ordered a blood test to check for EGFR ([id number]) resistant mutation. I recommended for the patient to continue Gilotrif with the same dose for now. Her last MRI of the brain was in August 2016. I will order repeat MRI of the brain to rule out any new brain metastasis. For the metastatic bone disease, I continue the patient on Xgeva 120 mg subcutaneously on monthly basis. The patient was also advised to increased  her intake of calcium supplement and vitamin D. For the nausea, she will continue on Zyprexa 10 mg by mouth daily at bedtime.  The patient would come back for follow-up visit in one month with repeat blood work. She was advised to call immediately if she has any concerning symptoms in the interval. The patient voices understanding of current disease status and treatment options and is in agreement with the current care plan.  All questions were answered. The patient knows to call the clinic with any problems, questions or concerns. We can certainly see the patient much sooner if necessary.  Disclaimer: This note was dictated with voice recognition software. Similar sounding words can inadvertently be transcribed and may not be corrected upon review.  Paronychia

## 2015-10-19 NOTE — Telephone Encounter (Signed)
Gave and printed appt sched and avs for pt for April °

## 2015-10-25 ENCOUNTER — Telehealth: Payer: Self-pay | Admitting: *Deleted

## 2015-10-25 ENCOUNTER — Encounter: Payer: Self-pay | Admitting: *Deleted

## 2015-10-25 ENCOUNTER — Other Ambulatory Visit: Payer: Self-pay | Admitting: Medical Oncology

## 2015-10-25 LAB — EPIDERMAL GROWTH FACTOR RECEPTOR (EGFR) MUTATION ANALYSIS

## 2015-10-25 NOTE — Progress Notes (Unsigned)
[facility]ed [facility] to reduce tube feedings from qid to bid. Order also faxed.

## 2015-10-25 NOTE — Progress Notes (Signed)
Oncology Nurse Navigator Documentation  Oncology Nurse Navigator Flowsheets 10/25/2015  Navigator Encounter Type Telephone  Telephone Incoming Call  Acuity Level 2  Acuity Level 2 Assistance expediting appointments  Time Spent with Patient 15   Patient's partner called, [name].  She is on patient's hippa information.  She is asking why radiation appt has not been scheduled.  I called [facility].  Lysle Pearl will be working on appt and will get back with me with appt.  I updated [name] that I will call with appt time

## 2015-10-25 NOTE — Telephone Encounter (Signed)
Called Laura Walter back.  I updated her and Deandria on appt to see Dr. Lisbeth Renshaw on 11/01/15 at 8:30

## 2015-10-27 ENCOUNTER — Ambulatory Visit (HOSPITAL_COMMUNITY)
Admission: RE | Admit: 2015-10-27 | Discharge: 2015-10-27 | Disposition: A | Discharge status: Home / Self Care | Payer: BC Managed Care – PPO | Source: Ambulatory Visit | Attending: Internal Medicine | Admitting: Internal Medicine

## 2015-10-27 DIAGNOSIS — C7951 Secondary malignant neoplasm of bone: Secondary | ICD-10-CM | POA: Diagnosis not present

## 2015-10-27 DIAGNOSIS — Z5111 Encounter for antineoplastic chemotherapy: Secondary | ICD-10-CM

## 2015-10-27 DIAGNOSIS — C3491 Malignant neoplasm of unspecified part of right bronchus or lung: Secondary | ICD-10-CM | POA: Diagnosis not present

## 2015-10-27 DIAGNOSIS — E46 Unspecified protein-calorie malnutrition: Secondary | ICD-10-CM

## 2015-10-27 MED ORDER — GADOBENATE DIMEGLUMINE 529 MG/ML IV SOLN
15.0000 mL | Freq: Once | INTRAVENOUS | Status: AC | PRN
  Administered 2015-10-27: 11 mL via INTRAVENOUS

## 2015-10-30 ENCOUNTER — Telehealth: Payer: Self-pay | Admitting: Internal Medicine

## 2015-10-30 ENCOUNTER — Other Ambulatory Visit: Payer: Self-pay | Admitting: Internal Medicine

## 2015-10-30 ENCOUNTER — Telehealth: Payer: Self-pay | Admitting: *Deleted

## 2015-10-30 DIAGNOSIS — C3491 Malignant neoplasm of unspecified part of right bronchus or lung: Secondary | ICD-10-CM

## 2015-10-30 MED FILL — *GILOTRIF 30 MG TABLET: 30 | 30 days supply | Qty: 30 | Fill #2

## 2015-10-30 NOTE — Telephone Encounter (Signed)
Oncology Nurse Navigator Documentation  Oncology Nurse Navigator Flowsheets 10/30/2015  Navigator Encounter Type Telephone  Telephone Outgoing Call  Treatment Phase Treatment  Barriers/Navigation Needs Coordination of Care  Interventions Coordination of Care  Coordination of Care Other  Acuity Level 2  Acuity Level 2 Assistance expediting appointments  Time Spent with Patient 110   Per Dr. Worthy Flank request, I called patient to see if she would be willing to have tissue biopsy to check about mutation status.  She stated she would like to proceed with biopsy.  I will notify Dr. Julien Nordmann and make needed appt's

## 2015-10-30 NOTE — Telephone Encounter (Signed)
[facility] scheduling will contact re bx per 3/27 pof - no other orders per 3/27 pof.

## 2015-10-31 ENCOUNTER — Encounter: Payer: Self-pay | Admitting: Radiation Oncology

## 2015-10-31 NOTE — Progress Notes (Addendum)
Recon  Stage IV Non small cell carcinoma with positive EGFR mutation  With deletion in exon 19, ,  Right upper lobe lung mass  In addition to B/L mediastinal,supraclavicular lymphadenopathy =Progression  HX whole brain radiation 01/25/15- 02/08/15, T9,L3,lt acetabulum 01/10/15  started on Gliotrif 17m daily  S/p 5 months decreased to 363mdaily  08/31/15 Osmolite 1.5 cal can BID via peg  With free water 30cc  flushes before and after feedings, [place]  And also eats 2 meals daily  Along with the osmolite feediongs, no c/o pain or nausea at present,    Dr. MoJulien Nordmanneen 10/19/15, follow up in April, 2017  MRI 10/27/15 results in 8:47 AM BP 113/67 mmHg  Pulse 88  Temp(Src) 97.8 F (36.6 C) (Oral)  Resp 16  Ht 5' 7"  (1.702 m)  Wt 133 lb 8 oz (60.555 kg)  BMI 20.90 kg/m2  SpO2 99%  Wt Readings from Last 3 Encounters:  11/01/15 133 lb 8 oz (60.555 kg)  10/19/15 129 lb 9.6 oz (58.786 kg)  09/20/15 128 lb 3.2 oz (58.151 kg)

## 2015-11-01 ENCOUNTER — Telehealth: Payer: Self-pay | Admitting: *Deleted

## 2015-11-01 ENCOUNTER — Encounter: Payer: Self-pay | Admitting: Radiation Oncology

## 2015-11-01 ENCOUNTER — Ambulatory Visit
Admission: RE | Admit: 2015-11-01 | Discharge: 2015-11-01 | Disposition: A | Discharge status: Home / Self Care | Payer: BC Managed Care – PPO | Source: Ambulatory Visit | Attending: Radiation Oncology | Admitting: Radiation Oncology

## 2015-11-01 VITALS — BP 113/67 | HR 88 | Temp 97.8°F | Resp 16 | Ht 67.0 in | Wt 133.5 lb

## 2015-11-01 DIAGNOSIS — C7931 Secondary malignant neoplasm of brain: Secondary | ICD-10-CM | POA: Diagnosis present

## 2015-11-01 DIAGNOSIS — E049 Nontoxic goiter, unspecified: Secondary | ICD-10-CM | POA: Insufficient documentation

## 2015-11-01 DIAGNOSIS — C3490 Malignant neoplasm of unspecified part of unspecified bronchus or lung: Secondary | ICD-10-CM | POA: Diagnosis present

## 2015-11-01 DIAGNOSIS — Z510 Encounter for antineoplastic radiation therapy: Secondary | ICD-10-CM | POA: Diagnosis not present

## 2015-11-01 DIAGNOSIS — C3491 Malignant neoplasm of unspecified part of right bronchus or lung: Secondary | ICD-10-CM

## 2015-11-01 DIAGNOSIS — Z9689 Presence of other specified functional implants: Secondary | ICD-10-CM | POA: Diagnosis not present

## 2015-11-01 DIAGNOSIS — R197 Diarrhea, unspecified: Secondary | ICD-10-CM | POA: Insufficient documentation

## 2015-11-01 DIAGNOSIS — C7951 Secondary malignant neoplasm of bone: Secondary | ICD-10-CM | POA: Insufficient documentation

## 2015-11-01 HISTORY — DX: Malignant neoplasm of unspecified part of unspecified bronchus or lung: C34.90

## 2015-11-01 NOTE — Progress Notes (Signed)
Radiation Oncology         [phone] ________________________________  Name: [name] MRN: [id number]  Date: 11/01/2015  DOB: [birth year]  Follow-Up Visit Note  CC: [name], MD  Curt Bears, MD  Diagnosis: Laura Walter is a 65 y.o. 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 The patient also has multiple brain metastases. To accomplish treating multiple significant areas of disease within the spine, the patient was treated with a 3-D conformal technique on our tomotherapy unit. She was also treated with a course of whole brain radiation treatment, also delivered on our tomotherapy unit in the 3-D conformal technique mode. These treatments were delivered concurrently.  Narrative:  The patient returns today for a re-consultation. Recent CT scan of the chest/abd/pelvis, on 10/16/15, showed interval increase in the size of the right upper lobe lung mass compatible with disease progression from 2.5 x 1.9 cm in November 2016 to 4.4 x 4.3 cm in March 2017. Dr. Julien Nordmann has recommended the patient undergo a biopsy of this to be submitted for morphologic assessment. He has asked the patient to meet with Dr. Lisbeth Renshaw for consideration of SBRT.  She continues systemic treatment with Gilotrif 30 mg by mouth daily, and Xgeva 120 mg subcutaneously monthly. MRI of the brain on 10/27/15 showed that only 1 lesion remained faintly visible, no new brain metastases, and radiation changes.  ROS: On review of systems, she still has a PEG tube in place and uses 2 cans of feeding treatments and is down from 4. She has started to eat again and is hopeful that her G tube will be removed soon. She denies any abdominal pain, nausea, vomiting. She  does have diarrhea daily due to her Gilotrif and reports normal urinary habits. She denies any chest pain, shortness of breath, wheezing, but has an occasional non-productive cough. She states upper bilateral weakness particularly over the anterior aspect of her shoulders and discomfort when she pushes herself out from a seated position. She denies any Pain, hip pain, blurred vision, double vision, headaches, or increasing dizziness. A complete review of systems is obtained and is otherwise negative.  ALLERGIES:  is allergic to sulfonamide derivatives.  Meds: Current Outpatient Prescriptions  Medication Sig Dispense Refill  . afatinib dimaleate (GILOTRIF) 30 MG tablet Take 1 tablet (30 mg total) by mouth daily. Take on an empty stomach 1hr before or 2 hrs after meals. 30 tablet 2  . ibuprofen (ADVIL,MOTRIN) 200 MG tablet Take 200 mg by mouth every 6 (six) hours as needed for moderate pain. Reported on 10/19/2015    . loperamide (IMODIUM) 2 MG capsule Take 2 mg by mouth as needed for diarrhea or loose stools (Up to 8 times daily, if needed.). Reported on 10/19/2015    . Nutritional Supplements (FEEDING SUPPLEMENT, OSMOLITE 1.5 CAL,) LIQD Place 237 mLs into feeding tube 4 (four) times daily. 958 mL 12  . OLANZapine (ZYPREXA) 10 MG tablet TAKE 1 TABLET (10 MG TOTAL) BY MOUTH AT BEDTIME. 30 tablet 1  . OVER THE COUNTER MEDICATION Reported on 10/19/2015    . Amino Acids-Protein Hydrolys (FEEDING SUPPLEMENT, PRO-STAT SUGAR FREE 64,) LIQD Take 30 mLs by mouth daily. (Patient not taking: Reported on 11/01/2015) 900 mL 0  .  feeding supplement, ENSURE ENLIVE, (ENSURE ENLIVE) LIQD Take 237 mLs by mouth 2 (two) times daily between meals. (Patient not taking: Reported on 11/01/2015) 237 mL 12  . HYDROcodone-homatropine (HYCODAN) 5-1.5 MG/5ML syrup Take 5 mLs by mouth every 6 (six) hours as needed for cough. (Patient not taking: Reported on 07/26/2015) 120 mL 0  . ondansetron (ZOFRAN) 8 MG tablet Take 1 tablet (8 mg  total) by mouth every 8 (eight) hours as needed for nausea or vomiting. (Patient not taking: Reported on 07/26/2015) 20 tablet 3  . pantoprazole (PROTONIX) 40 MG tablet Take 1 tablet by mouth daily. Reported on 11/01/2015     No current facility-administered medications for this encounter.    Physical Findings:  height is '5\' 7"'$  (1.702 m) and weight is 133 lb 8 oz (60.555 kg). Her oral temperature is 97.8 F (36.6 C). Her blood pressure is 113/67 and her pulse is 88. Her respiration is 16 and oxygen saturation is 99%.   Pain scale 0/10 In general this is a well appearing caucasian woman in no acute distress. She is alert and oriented x4 and appropriate throughout the examination. HEENT reveals that the patient is normocephalic, atraumatic. EOMs are intact. PERRLA. Skin is intact without any evidence of gross lesions. She does have point tenderness on the shoulders bilaterally anterior and inferior to the before meals joint. No palpable masses present. The spine is intact without any palpable abnormalities along the skin. Cardiovascular exam reveals a regular rate and rhythm, no clicks rubs or murmurs are auscultated. Chest is clear to auscultation bilaterally. Lymphatic assessment is performed and does not reveal any adenopathy in the cervical, supraclavicular, axillary, or inguinal chains. Abdomen has active bowel sounds in all quadrants and is intact. The abdomen is soft, non tender, non distended. Lower extremities are negative for pretibial pitting edema, deep calf tenderness, cyanosis or clubbing.  Lab Findings: Lab Results  Component Value Date   WBC 5.0 10/16/2015   WBC 7.6 04/12/2015   HGB 11.5* 10/16/2015   HGB 9.7* 04/12/2015   HCT 34.5* 10/16/2015   HCT 29.6* 04/12/2015   PLT 203 10/16/2015   PLT 141* 04/12/2015    Lab Results  Component Value Date   NA 139 10/16/2015   NA 137 04/12/2015   K 4.1 10/16/2015   K 3.7 04/12/2015   CHLORIDE 107 10/16/2015   CO2 25 10/16/2015    CO2 22 04/12/2015   GLUCOSE 83 10/16/2015   GLUCOSE 96 04/12/2015   BUN 19.5 10/16/2015   BUN 17 04/12/2015   CREATININE 0.8 10/16/2015   CREATININE 0.81 04/12/2015   CREATININE 0.83 11/02/2013   BILITOT 0.32 10/16/2015   BILITOT 1.3* 02/11/2015   ALKPHOS 53 10/16/2015   ALKPHOS 56 02/11/2015   AST 15 10/16/2015   AST 22 02/11/2015   ALT 12 10/16/2015   ALT 21 02/11/2015   PROT 6.7 10/16/2015   PROT 5.9* 02/11/2015   ALBUMIN 3.2* 10/16/2015   ALBUMIN 3.0* 02/11/2015   CALCIUM 8.7 10/16/2015   CALCIUM 8.4* 04/12/2015   ANIONGAP 7 10/16/2015   ANIONGAP 7 04/12/2015    Radiographic Findings: Ct Chest W Contrast  10/16/2015  CLINICAL DATA:  Patient history of metastatic right lung cancer. On chemotherapy. Radiation therapy. EXAM: CT CHEST, ABDOMEN, AND PELVIS WITH CONTRAST TECHNIQUE: Multidetector CT imaging of the chest, abdomen and pelvis was performed following the standard protocol during bolus administration of intravenous contrast. CONTRAST:  82m OMNIPAQUE IOHEXOL 300 MG/ML  SOLN COMPARISON:  CT CAP 07/04/2015 FINDINGS:  CT CHEST FINDINGS Mediastinum/Lymph Nodes: Visualized thyroid is unremarkable. No enlarged axillary, mediastinal or hilar lymphadenopathy. Normal heart size. Trace pericardial fluid. Lungs/Pleura: Central airways are patent. Interval increase in size of 4.4 x 4.3 cm right upper lobe mass (image 18; series 4), previously measuring 2.5 x 1.9 cm. Interval worsening of ground-glass opacities involving the right lower lobe with more focal consolidation within the subpleural medial right lower lobe (image 41; series 4). No pleural effusion or pneumothorax. Musculoskeletal: Re- demonstrated pathologic fracture involving the T9 vertebral body. Otherwise grossly unchanged extensive osseous metastasis. CT ABDOMEN PELVIS FINDINGS Hepatobiliary: Re- demonstrated hepatic cysts. No new or enlarging hepatic lesions. Gallbladder is unremarkable. No intrahepatic or extrahepatic biliary  ductal dilatation. Pancreas: Unremarkable Spleen: Unremarkable Adrenals/Urinary Tract: The adrenal glands are normal. Kidneys enhance symmetrically with contrast. No hydronephrosis. Urinary bladder is unremarkable. Stomach/Bowel: Stool is present throughout the colon. PEG tube is in place and appears in appropriate position. Vascular/Lymphatic: Tortuous yet normal caliber abdominal aorta. No retroperitoneal lymphadenopathy. Suggestion of apparent wall thickening within the cecum (image 106; series 2) is favored to be secondary to stool. 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 Other: Uterus is unremarkable. Musculoskeletal: Re- demonstrated pathologic fracture of the inferior left pubic ramus. Unchanged 6 mm sclerotic lesion were right ilium. Similar appearance of the L2 and L3 vertebral bodies with associated compression deformity. IMPRESSION: Interval increase in size of right upper lobe mass, most compatible with progressive disease. Interval worsening of right lower lobe ground-glass opacities with more focal consolidation within the medial right lower lobe. Findings may represent post treatment/radiation changes. Infectious or inflammatory process or malignancy are not excluded. Electronically Signed   By: Lovey Newcomer M.D.   On: 10/16/2015 15:55   Mr Jeri Cos HL Contrast  10/27/2015  CLINICAL DATA:  65 year old female with metastatic lung cancer status post whole brain radiation finished in July 2016. Restaging. Subsequent encounter. EXAM: MRI HEAD WITHOUT AND WITH CONTRAST TECHNIQUE: Multiplanar, multiecho pulse sequences of the brain and surrounding structures were obtained without and with intravenous contrast. CONTRAST:  50m MULTIHANCE GADOBENATE DIMEGLUMINE 529 MG/ML IV SOLN COMPARISON:  03/29/2015 and earlier. FINDINGS: Of the mostly punctate residual enhancing metastases demonstrated in August only 1 lesion remains faintly visible today (superior right insula series 11, image 32)  and is punctate. The largest 5 mm left parietal lobe and right insula metastases seen in August have resolved. No new brain metastasis. No dural thickening. Progressed generalized Patchy and confluent cerebral white matter T2 and FLAIR hyperintensity is the sequelae of whole brain radiation. No midline shift, or mass effect. No restricted diffusion to suggest acute infarction. No ventriculomegaly, extra-axial collection or acute intracranial hemorrhage. Cervicomedullary junction and pituitary are within normal limits. Major intracranial vascular flow voids are stable. Visible bone marrow signal is stable and within normal limits. Negative visualized cervical spine and spinal cord. Left mastoid effusion has regressed. Negative nasopharynx. Mild paranasal sinus mucosal thickening today. Negative orbit and scalp soft tissues. IMPRESSION: 1. Essentially resolved brain metastases following whole brain radiation in 2016. Only 1 of the previously-seen metastases is faintly visible following contrast today on series 11, image 32. 2. Cerebral white matter sequelae of whole brain radiation. Electronically Signed   By: HGenevie AnnM.D.   On: 10/27/2015 13:19   Ct Abdomen Pelvis W Contrast  10/16/2015  CLINICAL DATA:  Patient history of metastatic right lung cancer. On chemotherapy. Radiation therapy. EXAM: CT CHEST, ABDOMEN, AND PELVIS WITH CONTRAST TECHNIQUE: Multidetector CT imaging  of the chest, abdomen and pelvis was performed following the standard protocol during bolus administration of intravenous contrast. CONTRAST:  62m OMNIPAQUE IOHEXOL 300 MG/ML  SOLN COMPARISON:  CT CAP 07/04/2015 FINDINGS: CT CHEST FINDINGS Mediastinum/Lymph Nodes: Visualized thyroid is unremarkable. No enlarged axillary, mediastinal or hilar lymphadenopathy. Normal heart size. Trace pericardial fluid. Lungs/Pleura: Central airways are patent. Interval increase in size of 4.4 x 4.3 cm right upper lobe mass (image 18; series 4), previously  measuring 2.5 x 1.9 cm. Interval worsening of ground-glass opacities involving the right lower lobe with more focal consolidation within the subpleural medial right lower lobe (image 41; series 4). No pleural effusion or pneumothorax. Musculoskeletal: Re- demonstrated pathologic fracture involving the T9 vertebral body. Otherwise grossly unchanged extensive osseous metastasis. CT ABDOMEN PELVIS FINDINGS Hepatobiliary: Re- demonstrated hepatic cysts. No new or enlarging hepatic lesions. Gallbladder is unremarkable. No intrahepatic or extrahepatic biliary ductal dilatation. Pancreas: Unremarkable Spleen: Unremarkable Adrenals/Urinary Tract: The adrenal glands are normal. Kidneys enhance symmetrically with contrast. No hydronephrosis. Urinary bladder is unremarkable. Stomach/Bowel: Stool is present throughout the colon. PEG tube is in place and appears in appropriate position. Vascular/Lymphatic: Tortuous yet normal caliber abdominal aorta. No retroperitoneal lymphadenopathy. Suggestion of apparent wall thickening within the cecum (image 106; series 2) is favored to be secondary to stool. 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 Other: Uterus is unremarkable. Musculoskeletal: Re- demonstrated pathologic fracture of the inferior left pubic ramus. Unchanged 6 mm sclerotic lesion were right ilium. Similar appearance of the L2 and L3 vertebral bodies with associated compression deformity. IMPRESSION: Interval increase in size of right upper lobe mass, most compatible with progressive disease. Interval worsening of right lower lobe ground-glass opacities with more focal consolidation within the medial right lower lobe. Findings may represent post treatment/radiation changes. Infectious or inflammatory process or malignancy are not excluded. Electronically Signed   By: DLovey NewcomerM.D.   On: 10/16/2015 15:55    Impression: DKarrin Eisenmengeris a 65y.o. female presenting to clinic in regards to her  metastatic non-small lung cancer. The patient is recovering from the effects of radiation. The patient is a good candidate for SBRT to the right upper lobe mass that is increasing in size. In regards to the patient's upper bilateral extremity weakness, she might benefit from physical therapy.  Plan: Dr. MLisbeth Renshawdiscusses with the patient and the rationale for moving forward with SP right upper lobe lesion. He discusses the rationale for approximately 5 fractions administered in the outpatient setting. He also discussed the risks, benefits, short, and long-term side effects of this type of radiotherapy, and also discussed that SBRT was unlikely to make her breathing any worse. It is also unlikely to make her breathing symptoms any better. Written consent is obtained and the patient would like to move forward with treatment. CT simulation is scheduled on 11/08/15 at 10AM. If a time is available this Friday, she is able to come in that day instead.  The patient has also been referred to physical therapy for further assessment of her shoulder pain as this seems to be musculoskeletal in nature. We will continue to follow this expectantly.  ------------------------------------------------ The above documentation reflects my direct findings during this shared patient visit. Please see the separate note by Dr. MLisbeth Renshawon this date for the remainder of the patient's plan of care.    ACarola Rhine PAC  This document serves as a record of services personally performed by AShona Simpson PA-C and JKyung Rudd MD.  It was created on their behalf by Darcus Austin, a trained medical scribe. The creation of this record is based on the scribe's personal observations and the provider's statements to them. This document has been checked and approved by the attending provider.

## 2015-11-01 NOTE — Telephone Encounter (Signed)
PATIENT TO HAVE THIS PT APPT.  ON 11-09-15 @ 1 PM, [facility] SPOKE WITH THIS PATIENT AND SHE IS AWARE OF THIS APPT.

## 2015-11-01 NOTE — Progress Notes (Signed)
Please see the Nurse Progress Note in the MD Initial Consult Encounter for this patient. 

## 2015-11-02 ENCOUNTER — Other Ambulatory Visit: Payer: Self-pay | Admitting: Radiology

## 2015-11-02 NOTE — Patient Instructions (Addendum)
NPO after MN , needs driver, am meds with sips

## 2015-11-03 ENCOUNTER — Ambulatory Visit
Admission: RE | Admit: 2015-11-03 | Discharge: 2015-11-03 | Disposition: A | Discharge status: Home / Self Care | Payer: BC Managed Care – PPO | Source: Ambulatory Visit | Attending: Radiation Oncology | Admitting: Radiation Oncology

## 2015-11-03 ENCOUNTER — Other Ambulatory Visit: Payer: Self-pay | Admitting: Radiology

## 2015-11-03 DIAGNOSIS — C3411 Malignant neoplasm of upper lobe, right bronchus or lung: Secondary | ICD-10-CM

## 2015-11-03 DIAGNOSIS — Z510 Encounter for antineoplastic radiation therapy: Secondary | ICD-10-CM | POA: Diagnosis not present

## 2015-11-06 ENCOUNTER — Encounter (HOSPITAL_COMMUNITY): Payer: Self-pay

## 2015-11-06 ENCOUNTER — Ambulatory Visit (HOSPITAL_COMMUNITY)
Admission: RE | Admit: 2015-11-06 | Discharge: 2015-11-06 | Disposition: A | Discharge status: Home / Self Care | Payer: BC Managed Care – PPO | Source: Ambulatory Visit | Attending: Internal Medicine | Admitting: Internal Medicine

## 2015-11-06 DIAGNOSIS — C3491 Malignant neoplasm of unspecified part of right bronchus or lung: Secondary | ICD-10-CM

## 2015-11-06 DIAGNOSIS — C3411 Malignant neoplasm of upper lobe, right bronchus or lung: Secondary | ICD-10-CM | POA: Insufficient documentation

## 2015-11-06 DIAGNOSIS — R918 Other nonspecific abnormal finding of lung field: Secondary | ICD-10-CM | POA: Diagnosis present

## 2015-11-06 DIAGNOSIS — Z79899 Other long term (current) drug therapy: Secondary | ICD-10-CM | POA: Diagnosis not present

## 2015-11-06 LAB — CBC
HCT: 30.4 % — ABNORMAL LOW (ref 36.0–46.0)
Hemoglobin: 9.9 g/dL — ABNORMAL LOW (ref 12.0–15.0)
MCH: 31.8 pg (ref 26.0–34.0)
MCHC: 32.6 g/dL (ref 30.0–36.0)
MCV: 97.7 fL (ref 78.0–100.0)
PLATELETS: 208 10*3/uL (ref 150–400)
RBC: 3.11 MIL/uL — AB (ref 3.87–5.11)
RDW: 12.8 % (ref 11.5–15.5)
WBC: 5 10*3/uL (ref 4.0–10.5)

## 2015-11-06 LAB — PROTIME-INR
INR: 1.12 (ref 0.00–1.49)
PROTHROMBIN TIME: 14.6 s (ref 11.6–15.2)

## 2015-11-06 LAB — APTT: aPTT: 28 seconds (ref 24–37)

## 2015-11-06 MED ORDER — MIDAZOLAM HCL 2 MG/2ML IJ SOLN
INTRAMUSCULAR | Status: AC
  Filled 2015-11-06: qty 2

## 2015-11-06 MED ORDER — MIDAZOLAM HCL 2 MG/2ML IJ SOLN
INTRAMUSCULAR | Status: AC | PRN
  Administered 2015-11-06: 1 mg via INTRAVENOUS

## 2015-11-06 MED ORDER — SODIUM CHLORIDE 0.9 % IV SOLN: Freq: Once | INTRAVENOUS | Status: DC

## 2015-11-06 MED ORDER — LIDOCAINE HCL 1 % IJ SOLN
INTRAMUSCULAR | Status: AC
  Filled 2015-11-06: qty 20

## 2015-11-06 MED ORDER — FENTANYL CITRATE (PF) 100 MCG/2ML IJ SOLN
INTRAMUSCULAR | Status: AC | PRN
  Administered 2015-11-06: 50 ug via INTRAVENOUS

## 2015-11-06 MED ORDER — FENTANYL CITRATE (PF) 100 MCG/2ML IJ SOLN
INTRAMUSCULAR | Status: AC
  Filled 2015-11-06: qty 2

## 2015-11-06 NOTE — Sedation Documentation (Signed)
Patient is resting comfortably. No complaints of pain at this time

## 2015-11-06 NOTE — Procedures (Signed)
CT core RUL lung mass 18g x5 to surg path No complication, no ptx No blood loss. See complete dictation in [facility].

## 2015-11-06 NOTE — Discharge Instructions (Signed)

## 2015-11-06 NOTE — H&P (Signed)
Chief Complaint: Patient was seen in consultation today for lung mass biopsy at the request of [facility]  Referring Physician(s): Mohamed,Mohamed  Supervising Physician: Arne Cleveland  History of Present Illness: Laura Walter is a 64 y.o. female with hx of metastatic lung cancer. She has had prior biopsy of RUL mass but recent CT shows interval increase in size concerning for disease progression. She is planning stereotactic radiation, but repeat biopsy is requested to evaluate for EGFR mutation. She recalls the previous biopsy and she did well. PMHx, meds, labs, imaging, allergies reviewed. Has been NPO this am.  Past Medical History  Diagnosis Date  . Pneumonia 2009 & 2011  . Allergy     seasonal  . H/O reactive hypoglycemia   . Osteopenia   . IBS (irritable bowel syndrome)   . Bronchitis   . S/P radiation therapy completed 02/08/15    whole brain ,spine  . Brain cancer ([place]) 01/13/15    MRI multiple small brain mets 10 individual lesions  . Bone cancer ([place]) 01/10/15 PET    lytic lesion lt 1st rib,T9,L3 Lt acetabulum  . Lung cancer ([place])     stageIV nscca rul    Past Surgical History  Procedure Laterality Date  . Colonoscopy  2009    Negative; [facility]  . Tonsillectomy and adenoidectomy    . Wisdom tooth extraction    . Esophagogastroduodenoscopy (egd) with propofol N/A 04/11/2015    Procedure: ESOPHAGOGASTRODUODENOSCOPY (EGD) WITH PROPOFOL;  Surgeon: Carol Ada, MD;  Location: [facility];  Service: Endoscopy;  Laterality: N/A;  . Peg placement N/A 04/11/2015    Procedure: PERCUTANEOUS ENDOSCOPIC GASTROSTOMY (PEG) PLACEMENT;  Surgeon: Carol Ada, MD;  Location: [facility];  Service: Endoscopy;  Laterality: N/A;    Allergies: Sulfonamide derivatives  Medications: Prior to Admission medications   Medication Sig Start Date End Date Taking? Authorizing Provider  afatinib dimaleate (GILOTRIF) 30 MG tablet Take 1 tablet (30 mg total) by mouth daily.  Take on an empty stomach 1hr before or 2 hrs after meals. 08/21/15  Yes Curt Bears, MD  ibuprofen (ADVIL,MOTRIN) 200 MG tablet Take 200 mg by mouth every 6 (six) hours as needed for moderate pain. Reported on 10/19/2015   Yes Historical Provider, MD  loperamide (IMODIUM) 2 MG capsule Take 2 mg by mouth as needed for diarrhea or loose stools (Up to 8 times daily, if needed.). Reported on 10/19/2015   Yes Historical Provider, MD  Nutritional Supplements (FEEDING SUPPLEMENT, OSMOLITE 1.5 CAL,) LIQD Place 237 mLs into feeding tube 4 (four) times daily. 04/14/15  Yes Carol Ada, MD  OLANZapine (ZYPREXA) 10 MG tablet Take 10 mg by mouth at bedtime. 09/22/15  Yes Historical Provider, MD  promethazine (PHENERGAN) 12.5 MG tablet Take 12.5-25 mg by mouth every 8 (eight) hours as needed for nausea or vomiting. Reported on 11/02/2015    Historical Provider, MD     Family History  Problem Relation Age of Onset  . Transient ischemic attack Mother   . Mental illness Mother     Dementia  . Diabetes Father   . Heart attack Father 59  . Stomach cancer Maternal Aunt   . Heart attack Maternal Aunt     in 52s  . Diabetes Paternal Uncle     X 3  . Heart attack Maternal Grandfather     in 25s    Social History   Social History  . Marital Status: Single    Spouse Name: N/A  . Number of  Children: N/A  . Years of Education: N/A   Social History Main Topics  . Smoking status: Never Smoker   . Smokeless tobacco: Never Used  . Alcohol Use: Yes     Comment: occasionally rare wine  . Drug Use: No  . Sexual Activity: No   Other Topics Concern  . None   Social History Narrative    ECOG Status: 1 - Symptomatic but completely ambulatory  Review of Systems: A 12 point ROS discussed and pertinent positives are indicated in the HPI above.  All other systems are negative.  Review of Systems  Vital Signs: BP 112/79 mmHg  Pulse 89  Temp(Src) 97.2 F (36.2 C)  Resp 18  Ht 5' 7"  (1.702 m)  Wt 133 lb  (60.328 kg)  BMI 20.83 kg/m2  SpO2 100%  Physical Exam  Constitutional: She is oriented to person, place, and time. She appears well-developed and well-nourished. No distress.  HENT:  Head: Normocephalic.  Mouth/Throat: Oropharynx is clear and moist.  Neck: Normal range of motion. No tracheal deviation present.  Cardiovascular: Normal rate, regular rhythm and normal heart sounds.   Pulmonary/Chest: Effort normal and breath sounds normal. No respiratory distress.  Neurological: She is alert and oriented to person, place, and time.  Skin: Skin is warm and dry.  Psychiatric: She has a normal mood and affect. Judgment normal.    Mallampati Score:  MD Evaluation Airway: WNL Heart: WNL Abdomen: WNL Chest/ Lungs: WNL ASA  Classification: 2 Mallampati/Airway Score: Two  Imaging: Ct Chest W Contrast  10/16/2015  CLINICAL DATA:  Patient history of metastatic right lung cancer. On chemotherapy. Radiation therapy. EXAM: CT CHEST, ABDOMEN, AND PELVIS WITH CONTRAST TECHNIQUE: Multidetector CT imaging of the chest, abdomen and pelvis was performed following the standard protocol during bolus administration of intravenous contrast. CONTRAST:  77m OMNIPAQUE IOHEXOL 300 MG/ML  SOLN COMPARISON:  CT CAP 07/04/2015 FINDINGS: CT CHEST FINDINGS Mediastinum/Lymph Nodes: Visualized thyroid is unremarkable. No enlarged axillary, mediastinal or hilar lymphadenopathy. Normal heart size. Trace pericardial fluid. Lungs/Pleura: Central airways are patent. Interval increase in size of 4.4 x 4.3 cm right upper lobe mass (image 18; series 4), previously measuring 2.5 x 1.9 cm. Interval worsening of ground-glass opacities involving the right lower lobe with more focal consolidation within the subpleural medial right lower lobe (image 41; series 4). No pleural effusion or pneumothorax. Musculoskeletal: Re- demonstrated pathologic fracture involving the T9 vertebral body. Otherwise grossly unchanged extensive osseous  metastasis. CT ABDOMEN PELVIS FINDINGS Hepatobiliary: Re- demonstrated hepatic cysts. No new or enlarging hepatic lesions. Gallbladder is unremarkable. No intrahepatic or extrahepatic biliary ductal dilatation. Pancreas: Unremarkable Spleen: Unremarkable Adrenals/Urinary Tract: The adrenal glands are normal. Kidneys enhance symmetrically with contrast. No hydronephrosis. Urinary bladder is unremarkable. Stomach/Bowel: Stool is present throughout the colon. PEG tube is in place and appears in appropriate position. Vascular/Lymphatic: Tortuous yet normal caliber abdominal aorta. No retroperitoneal lymphadenopathy. Suggestion of apparent wall thickening within the cecum (image 106; series 2) is favored to be secondary to stool. 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 Other: Uterus is unremarkable. Musculoskeletal: Re- demonstrated pathologic fracture of the inferior left pubic ramus. Unchanged 6 mm sclerotic lesion were right ilium. Similar appearance of the L2 and L3 vertebral bodies with associated compression deformity. IMPRESSION: Interval increase in size of right upper lobe mass, most compatible with progressive disease. Interval worsening of right lower lobe ground-glass opacities with more focal consolidation within the medial right lower lobe. Findings may represent post treatment/radiation  changes. Infectious or inflammatory process or malignancy are not excluded. Electronically Signed   By: Lovey Newcomer M.D.   On: 10/16/2015 15:55   Mr Jeri Cos ZO Contrast  10/27/2015  CLINICAL DATA:  65 year old female with metastatic lung cancer status post whole brain radiation finished in July 2016. Restaging. Subsequent encounter. EXAM: MRI HEAD WITHOUT AND WITH CONTRAST TECHNIQUE: Multiplanar, multiecho pulse sequences of the brain and surrounding structures were obtained without and with intravenous contrast. CONTRAST:  61m MULTIHANCE GADOBENATE DIMEGLUMINE 529 MG/ML IV SOLN COMPARISON:   03/29/2015 and earlier. FINDINGS: Of the mostly punctate residual enhancing metastases demonstrated in August only 1 lesion remains faintly visible today (superior right insula series 11, image 32) and is punctate. The largest 5 mm left parietal lobe and right insula metastases seen in August have resolved. No new brain metastasis. No dural thickening. Progressed generalized Patchy and confluent cerebral white matter T2 and FLAIR hyperintensity is the sequelae of whole brain radiation. No midline shift, or mass effect. No restricted diffusion to suggest acute infarction. No ventriculomegaly, extra-axial collection or acute intracranial hemorrhage. Cervicomedullary junction and pituitary are within normal limits. Major intracranial vascular flow voids are stable. Visible bone marrow signal is stable and within normal limits. Negative visualized cervical spine and spinal cord. Left mastoid effusion has regressed. Negative nasopharynx. Mild paranasal sinus mucosal thickening today. Negative orbit and scalp soft tissues. IMPRESSION: 1. Essentially resolved brain metastases following whole brain radiation in 2016. Only 1 of the previously-seen metastases is faintly visible following contrast today on series 11, image 32. 2. Cerebral white matter sequelae of whole brain radiation. Electronically Signed   By: HGenevie AnnM.D.   On: 10/27/2015 13:19   Ct Abdomen Pelvis W Contrast  10/16/2015  CLINICAL DATA:  Patient history of metastatic right lung cancer. On chemotherapy. Radiation therapy. EXAM: CT CHEST, ABDOMEN, AND PELVIS WITH CONTRAST TECHNIQUE: Multidetector CT imaging of the chest, abdomen and pelvis was performed following the standard protocol during bolus administration of intravenous contrast. CONTRAST:  852mOMNIPAQUE IOHEXOL 300 MG/ML  SOLN COMPARISON:  CT CAP 07/04/2015 FINDINGS: CT CHEST FINDINGS Mediastinum/Lymph Nodes: Visualized thyroid is unremarkable. No enlarged axillary, mediastinal or hilar  lymphadenopathy. Normal heart size. Trace pericardial fluid. Lungs/Pleura: Central airways are patent. Interval increase in size of 4.4 x 4.3 cm right upper lobe mass (image 18; series 4), previously measuring 2.5 x 1.9 cm. Interval worsening of ground-glass opacities involving the right lower lobe with more focal consolidation within the subpleural medial right lower lobe (image 41; series 4). No pleural effusion or pneumothorax. Musculoskeletal: Re- demonstrated pathologic fracture involving the T9 vertebral body. Otherwise grossly unchanged extensive osseous metastasis. CT ABDOMEN PELVIS FINDINGS Hepatobiliary: Re- demonstrated hepatic cysts. No new or enlarging hepatic lesions. Gallbladder is unremarkable. No intrahepatic or extrahepatic biliary ductal dilatation. Pancreas: Unremarkable Spleen: Unremarkable Adrenals/Urinary Tract: The adrenal glands are normal. Kidneys enhance symmetrically with contrast. No hydronephrosis. Urinary bladder is unremarkable. Stomach/Bowel: Stool is present throughout the colon. PEG tube is in place and appears in appropriate position. Vascular/Lymphatic: Tortuous yet normal caliber abdominal aorta. No retroperitoneal lymphadenopathy. Suggestion of apparent wall thickening within the cecum (image 106; series 2) is favored to be secondary to stool. 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 Other: Uterus is unremarkable. Musculoskeletal: Re- demonstrated pathologic fracture of the inferior left pubic ramus. Unchanged 6 mm sclerotic lesion were right ilium. Similar appearance of the L2 and L3 vertebral bodies with associated compression deformity. IMPRESSION: Interval increase in size  of right upper lobe mass, most compatible with progressive disease. Interval worsening of right lower lobe ground-glass opacities with more focal consolidation within the medial right lower lobe. Findings may represent post treatment/radiation changes. Infectious or inflammatory  process or malignancy are not excluded. Electronically Signed   By: Lovey Newcomer M.D.   On: 10/16/2015 15:55    Labs:  CBC:  Recent Labs  07/21/15 1111 08/21/15 1502 09/20/15 1323 10/16/15 1118  WBC 3.5* 4.8 5.1 5.0  HGB 11.5* 11.8 11.8 11.5*  HCT 35.0 35.4 35.8 34.5*  PLT 197 215 177 203    COAGS:  Recent Labs  01/12/15 1030 01/24/15 0805  INR 1.14 1.13  APTT 27 23*    BMP:  Recent Labs  02/13/15 0513 02/14/15 0520 02/15/15 0520  04/12/15 0835  07/21/15 1111 08/21/15 1503 09/20/15 1323 10/16/15 1118  NA 133* 137 140  < > 137  < > 142 142 142 139  K 3.0* 2.6* 3.0*  < > 3.7  < > 3.8 4.3 4.5 4.1  CL 107 106 108  --  108  --   --   --   --   --   CO2 20* 24 24  < > 22  < > 25 26 26 25   GLUCOSE 101* 93 84  < > 96  < > 99 83 79 83  BUN 18 21* 18  < > 17  < > 35.7* 28.0* 26.4* 19.5  CALCIUM 7.7* 7.3* 7.6*  < > 8.4*  < > 9.0 9.0 9.0 8.7  CREATININE 0.63 0.56 0.63  < > 0.81  < > 0.9 0.8 0.9 0.8  GFRNONAA >60 >60 >60  --  >60  --   --   --   --   --   GFRAA >60 >60 >60  --  >60  --   --   --   --   --   < > = values in this interval not displayed.  LIVER FUNCTION TESTS:  Recent Labs  07/21/15 1111 08/21/15 1503 09/20/15 1323 10/16/15 1118  BILITOT 0.42 <0.30 0.32 0.32  AST 16 15 16 15   ALT 12 10 11 12   ALKPHOS 57 56 59 53  PROT 6.8 6.8 6.6 6.7  ALBUMIN 3.5 3.4* 3.3* 3.2*    TUMOR MARKERS: No results for input(s): AFPTM, CEA, CA199, CHROMGRNA in the last 8760 hours.  Assessment and Plan: RUL lung mass, known cancer. For repeat CT guided biopsy today to eval for EGFR mutation Labs pending Risks and Benefits discussed with the patient including, but not limited to bleeding, hemoptysis, respiratory failure requiring intubation, infection, pneumothorax requiring chest tube placement, stroke from air embolism or even death. All of the patient's questions were answered, patient is agreeable to proceed. Consent signed and in chart.    Thank you for this  interesting consult.  I greatly enjoyed meeting Laura Walter and look forward to participating in their care.  A copy of this report was sent to the requesting provider on this date.  Electronically Signed: Ascencion Dike 11/06/2015, 7:49 AM   I spent a total of 20 minutes in face to face in clinical consultation, greater than 50% of which was counseling/coordinating care for lung mass biopsy

## 2015-11-06 NOTE — Sedation Documentation (Signed)
Patient is resting comfortably. No complaints of pain at this time.

## 2015-11-08 ENCOUNTER — Ambulatory Visit: Payer: BC Managed Care – PPO | Admitting: Radiation Oncology

## 2015-11-09 ENCOUNTER — Encounter: Payer: Self-pay | Admitting: Physical Therapy

## 2015-11-09 ENCOUNTER — Ambulatory Visit: Payer: BC Managed Care – PPO | Attending: Radiation Oncology | Admitting: Physical Therapy

## 2015-11-09 DIAGNOSIS — R293 Abnormal posture: Secondary | ICD-10-CM | POA: Diagnosis present

## 2015-11-09 DIAGNOSIS — M25512 Pain in left shoulder: Secondary | ICD-10-CM | POA: Insufficient documentation

## 2015-11-09 DIAGNOSIS — M25511 Pain in right shoulder: Secondary | ICD-10-CM | POA: Diagnosis present

## 2015-11-09 DIAGNOSIS — M6281 Muscle weakness (generalized): Secondary | ICD-10-CM | POA: Insufficient documentation

## 2015-11-09 DIAGNOSIS — M25612 Stiffness of left shoulder, not elsewhere classified: Secondary | ICD-10-CM | POA: Diagnosis present

## 2015-11-09 DIAGNOSIS — M25611 Stiffness of right shoulder, not elsewhere classified: Secondary | ICD-10-CM | POA: Diagnosis present

## 2015-11-09 DIAGNOSIS — Z510 Encounter for antineoplastic radiation therapy: Secondary | ICD-10-CM | POA: Diagnosis not present

## 2015-11-09 NOTE — Therapy (Signed)
[place], [place], [address] Phone: [phone]   Fax:  [phone]  Physical Therapy Evaluation  Patient Details  Name: Laura Walter MRN: [id number] Date of Birth: 1951/01/12 Referring Provider: Shona Simpson  Encounter Date: 11/09/2015      PT End of Session - 11/09/15 1428    Visit Number 1   Number of Visits 17   Date for PT Re-Evaluation 01/04/16   PT Start Time 1301   PT Stop Time 1027   PT Time Calculation (min) 46 min   Activity Tolerance Patient tolerated treatment well   Behavior During Therapy [facility] for tasks assessed/performed      Past Medical History  Diagnosis Date  . Pneumonia 2009 & 2011  . Allergy     seasonal  . H/O reactive hypoglycemia   . Osteopenia   . IBS (irritable bowel syndrome)   . Bronchitis   . S/P radiation therapy completed 02/08/15    whole brain ,spine  . Brain cancer ([place]) 01/13/15    MRI multiple small brain mets 10 individual lesions  . Bone cancer ([place]) 01/10/15 PET    lytic lesion lt 1st rib,T9,L3 Lt acetabulum  . Lung cancer ([place])     stageIV nscca rul    Past Surgical History  Procedure Laterality Date  . Colonoscopy  2009    Negative; [facility]  . Tonsillectomy and adenoidectomy    . Wisdom tooth extraction    . Esophagogastroduodenoscopy (egd) with propofol N/A 04/11/2015    Procedure: ESOPHAGOGASTRODUODENOSCOPY (EGD) WITH PROPOFOL;  Surgeon: Carol Ada, MD;  Location: [facility];  Service: Endoscopy;  Laterality: N/A;  . Peg placement N/A 04/11/2015    Procedure: PERCUTANEOUS ENDOSCOPIC GASTROSTOMY (PEG) PLACEMENT;  Surgeon: Carol Ada, MD;  Location: [facility];  Service: Endoscopy;  Laterality: N/A;    There were no vitals filed for this visit.  Visit Diagnosis:  Muscle weakness (generalized) - Plan: PT plan of care cert/re-cert  Right shoulder pain - Plan: PT plan of care cert/re-cert  Left shoulder pain - Plan: PT plan of care  cert/re-cert  Limitation of joint motion of right shoulder - Plan: PT plan of care cert/re-cert  Limitation of joint motion of left shoulder - Plan: PT plan of care cert/re-cert  Abnormal posture - Plan: PT plan of care cert/re-cert      Subjective Assessment - 11/09/15 1308    Subjective I am weak and I want to be able to build my legs up a little bit. My shoulders hurt when I push up from the chair to stand.    Patient is accompained by: --  friend   Limitations Lifting;Standing;Walking;House hold activities   How long can you stand comfortably? 5 min   How long can you walk comfortably? 8 min   Patient Stated Goals increase strength of bilateral LEs and UEs, stand from a chair easier   Currently in Pain? 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  No   Is the patient reluctant to leave their home because of a fear of falling?  No   Home Social worker Private residence   Living Arrangements Non-relatives/Friends   Available Help at Discharge Friend(s)   Type of [place] to enter   Entrance Stairs-Number of Steps 1   Entrance Stairs-Rails Can reach both   [place] Two level;Able to live on main level with bedroom/bathroom   Alternate Level Stairs-Number of Steps 12   Alternate Level Stairs-Rails Right   Home Equipment Shower seat;Wheelchair - [employer] - 4 wheels   Prior Function   Level  of Independence Independent   Vocation Retired   Leisure pt does exercises from [facility] PT every other day   Cognition   Overall Cognitive Status Within Functional Limits for tasks assessed   AROM   Right Shoulder Extension 59 Degrees   Right Shoulder Flexion 76 Degrees   Right Shoulder ABduction 70 Degrees   Right Shoulder Internal Rotation --  unable to assess secondary to pain   Right Shoulder External Rotation --  unable to assess secondary to pain   Left Shoulder Extension 59 Degrees   Left Shoulder Flexion 85 Degrees   Left Shoulder ABduction 60 Degrees   Left Shoulder Internal Rotation --  unable to assess secondary to pain   Left Shoulder External Rotation --  unable to assess secondary to pain   Strength   Right Elbow Flexion 4/5   Right Elbow Extension 3/5   Left Elbow Flexion 4/5   Left Elbow Extension 3/5   Right Hip Flexion 3+/5   Right Hip ABduction 3+/5   Right Hip ADduction 5/5   Left Hip Flexion 3+/5   Left Hip ABduction 3+/5   Left Hip ADduction 5/5   Right Knee Flexion 3+/5   Right Knee Extension 3+/5   Left Knee Flexion 3+/5   Left Knee Extension 3+/5   Right Ankle Dorsiflexion 3+/5   Left Ankle Dorsiflexion 3/5  pt reports swelling in left ankle           LYMPHEDEMA/ONCOLOGY QUESTIONNAIRE - 11/09/15 1325    Type   Cancer Type lung cancer- right lobe   Treatment   Active Chemotherapy Treatment Yes   Date 03/06/15   Past Chemotherapy Treatment No   Active Radiation Treatment No  pt to begin radiation to lung next week   Date --   Body Site lung   Past Radiation Treatment Yes   Date 01/23/15   Body Site pelvis and brain           Katina Dung - 11/09/15 0001    Open a tight or new jar Severe difficulty   Do heavy household chores (wash walls, wash floors) Severe difficulty   Carry a shopping bag or briefcase Moderate difficulty   Wash your back Severe difficulty   Use a knife to cut food Moderate difficulty   Recreational activities in  which you take some force or impact through your arm, shoulder, or hand (golf, hammering, tennis) Severe difficulty   During the past week, to what extent has your arm, shoulder or hand problem interfered with your normal social activities with family, friends, neighbors, or groups? Slightly   During the past week, to what extent has  your arm, shoulder or hand problem limited your work or other regular daily activities Quite a bit   Arm, shoulder, or hand pain. Moderate   Tingling (pins and needles) in your arm, shoulder, or hand None   Difficulty Sleeping No difficulty   DASH Score 50 %                     PT Education - 11/09/15 1427    Education provided Yes   Education Details practice sit to stands from chair without using arms to increase LE strength   Person(s) Educated Patient;Caregiver(s)   Methods Explanation   Comprehension Verbalized understanding;Returned demonstration           Short Term Clinic Goals - 11/09/15 1615    CC Short Term Goal  #1   Title Pt to demonstrate 105 degrees of shoulder flexion bilaterally to allow pt to reach items on shelves.    Baseline R 76, L 85   Time 4   Period Weeks   Status New   CC Short Term Goal  #2   Title Pt to demonstrate 90 degrees of shoulder abduction bilaterally to allow pt to reach out to sides   Baseline R 70, L 60   Time 4   Period Weeks   Status New   CC Short Term Goal  #3   Title Pt to demonstrate 3+/5 tricep strength to allow pt to push to stand without pain   Baseline 3/5   Time 4   Period Weeks   Status New   CC Short Term Goal  #4   Title Pt to demonstate 4/5 hip flexor strength to decrease fatigue during ambulation   Baseline 3+/5   Time 4   Period Weeks   Status New             Long Term Clinic Goals - 11/09/15 1618    CC Long Term Goal  #1   Title Pt to demonstate 160 degrees of bilateral shoulder flexion to allow pt to reach overhead   Baseline R 76, L 85   Time 8   Period  Weeks   Status New   CC Long Term Goal  #2   Title Pt to demonstrate 160 degrees of bilateral shoulder abduction to allow pt to reach items out to sides   Baseline R 70, L 60   Time 8   Period Weeks   Status New   CC Long Term Goal  #3   Title Pt to demonstrate gross shoulder strength of 4/5 to allow pt to return to prior level of function   Baseline 2+/5   Time 8   Period Weeks   Status New   CC Long Term Goal  #4   Title Pt to be independent in a home exercise program for strengthening, ROM and posture for long term management   Baseline NA   Time 8   Period Weeks   Status New   CC Long Term Goal  #5   Title Pt to improve quick DASH score to less than or equal to 30% impairment   Baseline 50%   Time 8   Period Weeks   Status New            Plan - 11/09/15 1322    Clinical Impression Statement Pt was very active. She states she played a lot of golf, walked the dog, always working on projects around the house. Pt rates her fatigue level  is 7/10 and her fatigue and weakness infere with her ability to return to her prior level of function. Pt has significantly decreased bilateral shoulder ROM and pain when pushing up to stand from chair. Her poor posture also makes it difficult for her to achieve full shoulder ROM. 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 Pt would benefit from ROM of bilateral UEs, strengthening of BUEs and BLEs to increase functional mobility and decrease fatigue.    Pt will benefit from skilled therapeutic intervention in order to improve on the following deficits Postural dysfunction;Decreased range of motion;Impaired UE functional use;Pain;Decreased strength;Decreased activity tolerance;Decreased endurance   Rehab Potential Good   Clinical Impairments Affecting Rehab Potential pt to begin radiation to lung    PT Frequency 2x / week   PT Duration 8 weeks    PT Treatment/Interventions Therapeutic exercise;Therapeutic activities;Taping;Electrical Stimulation;Passive range of motion;Patient/family education   PT Next Visit Plan PROM to bilateral shoulders, begin posture exercises, give ROM exercises for bilateral shoulders   PT Home Exercise Plan sit to stands without use of hands   Consulted and Agree with Plan of Care Patient;Family member/caregiver         Problem List Patient Active Problem List   Diagnosis Date Noted  . Paronychia of great toe, right 08/21/2015  . Paronychia of great toe, left 08/21/2015  . Nausea without vomiting 04/24/2015  . Feeding difficulties 04/11/2015  . Encounter for antineoplastic chemotherapy 03/06/2015  . Hypokalemia 02/15/2015  . Protein-calorie malnutrition ([place]) 02/15/2015  . Neutropenic fever ([employer]) 02/11/2015  . Neutropenia with fever ([place]) 02/11/2015  . Bicytopenia 02/11/2015  . Diarrhea 02/11/2015  . Primary cancer of right lung ([place]) 01/05/2015  . 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 Bone metastases ([place]) 01/03/2015  . Physical exam 10/13/2014  . Osteopenia 10/07/2013  . Benign positional vertigo 05/08/2012  . TINNITUS, LEFT 01/09/2009  . CARDIAC MURMUR, AORTIC 01/05/2007  . MOTION SICKNESS 01/05/2007    Alexia Freestone 11/09/2015, 4:22 PM  [place], [place], [address] Phone: [phone]   Fax:  [phone]  Name: Laura Walter MRN: [id number] Date of Birth: [birth year]   Allyson Sabal, PT 11/09/2015 4:22 PM

## 2015-11-11 ENCOUNTER — Encounter (HOSPITAL_COMMUNITY): Payer: Self-pay | Admitting: *Deleted

## 2015-11-11 ENCOUNTER — Emergency Department (HOSPITAL_COMMUNITY)
Admission: EM | Admit: 2015-11-11 | Discharge: 2015-11-11 | Disposition: A | Discharge status: Home / Self Care | Payer: BC Managed Care – PPO | Attending: Emergency Medicine | Admitting: Emergency Medicine

## 2015-11-11 ENCOUNTER — Emergency Department (HOSPITAL_COMMUNITY): Payer: BC Managed Care – PPO

## 2015-11-11 DIAGNOSIS — S01111A Laceration without foreign body of right eyelid and periocular area, initial encounter: Secondary | ICD-10-CM | POA: Insufficient documentation

## 2015-11-11 DIAGNOSIS — W101XXA Fall (on)(from) sidewalk curb, initial encounter: Secondary | ICD-10-CM | POA: Diagnosis not present

## 2015-11-11 DIAGNOSIS — Y9389 Activity, other specified: Secondary | ICD-10-CM | POA: Diagnosis not present

## 2015-11-11 DIAGNOSIS — Z85830 Personal history of malignant neoplasm of bone: Secondary | ICD-10-CM | POA: Diagnosis not present

## 2015-11-11 DIAGNOSIS — R52 Pain, unspecified: Secondary | ICD-10-CM

## 2015-11-11 DIAGNOSIS — Z8719 Personal history of other diseases of the digestive system: Secondary | ICD-10-CM | POA: Diagnosis not present

## 2015-11-11 DIAGNOSIS — Z85841 Personal history of malignant neoplasm of brain: Secondary | ICD-10-CM | POA: Diagnosis not present

## 2015-11-11 DIAGNOSIS — S80211A Abrasion, right knee, initial encounter: Secondary | ICD-10-CM | POA: Insufficient documentation

## 2015-11-11 DIAGNOSIS — Z8701 Personal history of pneumonia (recurrent): Secondary | ICD-10-CM | POA: Diagnosis not present

## 2015-11-11 DIAGNOSIS — Z923 Personal history of irradiation: Secondary | ICD-10-CM | POA: Diagnosis not present

## 2015-11-11 DIAGNOSIS — Z79899 Other long term (current) drug therapy: Secondary | ICD-10-CM | POA: Diagnosis not present

## 2015-11-11 DIAGNOSIS — Z85118 Personal history of other malignant neoplasm of bronchus and lung: Secondary | ICD-10-CM | POA: Insufficient documentation

## 2015-11-11 DIAGNOSIS — S0993XA Unspecified injury of face, initial encounter: Secondary | ICD-10-CM | POA: Diagnosis present

## 2015-11-11 DIAGNOSIS — W19XXXA Unspecified fall, initial encounter: Secondary | ICD-10-CM

## 2015-11-11 DIAGNOSIS — Y92480 Sidewalk as the place of occurrence of the external cause: Secondary | ICD-10-CM | POA: Insufficient documentation

## 2015-11-11 DIAGNOSIS — T07XXXA Unspecified multiple injuries, initial encounter: Secondary | ICD-10-CM

## 2015-11-11 DIAGNOSIS — Z8739 Personal history of other diseases of the musculoskeletal system and connective tissue: Secondary | ICD-10-CM | POA: Insufficient documentation

## 2015-11-11 DIAGNOSIS — S0181XA Laceration without foreign body of other part of head, initial encounter: Secondary | ICD-10-CM

## 2015-11-11 DIAGNOSIS — Y998 Other external cause status: Secondary | ICD-10-CM | POA: Diagnosis not present

## 2015-11-11 DIAGNOSIS — S80212A Abrasion, left knee, initial encounter: Secondary | ICD-10-CM | POA: Diagnosis not present

## 2015-11-11 MED ORDER — LIDOCAINE-EPINEPHRINE (PF) 2 %-1:200000 IJ SOLN
10.0000 mL | Freq: Once | INTRAMUSCULAR | Status: AC
  Administered 2015-11-11: 10 mL

## 2015-11-11 MED ORDER — LIDOCAINE-EPINEPHRINE (PF) 2 %-1:200000 IJ SOLN
INTRAMUSCULAR | Status: AC
  Administered 2015-11-11: 10 mL
  Filled 2015-11-11: qty 20

## 2015-11-11 MED ORDER — BACITRACIN ZINC 500 UNIT/GM EX OINT
TOPICAL_OINTMENT | CUTANEOUS | Status: AC
  Filled 2015-11-11: qty 1.8

## 2015-11-11 MED ORDER — BACITRACIN ZINC 500 UNIT/GM EX OINT: 1.0000 "application " | TOPICAL_OINTMENT | Freq: Two times a day (BID) | CUTANEOUS | Status: DC

## 2015-11-11 NOTE — ED Notes (Signed)
Patient is alert and oriented x4.  She is complaining of a fall.  Patient states that she is currently taking  Daily chemo for lung cancer and began to feel weak and fell.  Patient has both knee abrasions and facial  Lacerations.  Currently she rates her pain 4 of 10.

## 2015-11-11 NOTE — Discharge Instructions (Signed)
Head Injury, Adult You have received a head injury. It does not appear serious at this time. Headaches and vomiting are common following head injury. It should be easy to awaken from sleeping. Sometimes it is necessary for you to stay in the emergency department for a while for observation. Sometimes admission to the hospital may be needed. After injuries such as yours, most problems occur within the first 24 hours, but side effects may occur up to 7-10 days after the injury. It is important for you to carefully monitor your condition and contact your health care provider or seek immediate medical care if there is a change in your condition. WHAT ARE THE TYPES OF HEAD INJURIES? Head injuries can be as minor as a bump. Some head injuries can be more severe. More severe head injuries include:  A jarring injury to the brain (concussion).  A bruise of the brain (contusion). This mean there is bleeding in the brain that can cause swelling.  A cracked skull (skull fracture).  Bleeding in the brain that collects, clots, and forms a bump (hematoma). WHAT CAUSES A HEAD INJURY? A serious head injury is most likely to happen to someone who is in a car wreck and is not wearing a seat belt. Other causes of major head injuries include bicycle or motorcycle accidents, sports injuries, and falls. HOW ARE HEAD INJURIES DIAGNOSED? A complete history of the event leading to the injury and your current symptoms will be helpful in diagnosing head injuries. Many times, pictures of the brain, such as CT or MRI are needed to see the extent of the injury. Often, an overnight hospital stay is necessary for observation.  WHEN SHOULD I SEEK IMMEDIATE MEDICAL CARE?  You should get help right away if:  You have confusion or drowsiness.  You feel sick to your stomach (nauseous) or have continued, forceful vomiting.  You have dizziness or unsteadiness that is getting worse.  You have severe, continued headaches not  relieved by medicine. Only take over-the-counter or prescription medicines for pain, fever, or discomfort as directed by your health care provider.  You do not have normal function of the arms or legs or are unable to walk.  You notice changes in the black spots in the center of the colored part of your eye (pupil).  You have a clear or bloody fluid coming from your nose or ears.  You have a loss of vision. During the next 24 hours after the injury, you must stay with someone who can watch you for the warning signs. This person should contact local emergency services (911 in the [place] if you have seizures, you become unconscious, or you are unable to wake up. HOW CAN I PREVENT A HEAD INJURY IN THE FUTURE? The most important factor for preventing major head injuries is avoiding motor vehicle accidents. To minimize the potential for damage to your head, it is crucial to wear seat belts while riding in motor vehicles. Wearing helmets while bike riding and playing collision sports (like football) is also helpful. Also, avoiding dangerous activities around the house will further help reduce your risk of head injury.  WHEN CAN I RETURN TO NORMAL ACTIVITIES AND ATHLETICS? You should be reevaluated by your health care provider before returning to these activities. If you have any of the following symptoms, you should not return to activities or contact sports until 1 week after the symptoms have stopped:  Persistent headache.  Dizziness or vertigo.  Poor attention and concentration.  Confusion.  Memory problems.  Nausea or vomiting.  Fatigue or tire easily.  Irritability.  Intolerant of bright lights or loud noises.  Anxiety or depression.  Disturbed sleep. MAKE SURE YOU:   Understand these instructions.  Will watch your condition.  Will get help right away if you are not doing well or get worse.   This information is not intended to replace advice given to you by your health  care provider. Make sure you discuss any questions you have with your health care provider.   Document Released: 07/22/2005 Document Revised: 08/12/2014 Document Reviewed: 03/29/2013 Elsevier Interactive Patient Education 2016 Elsevier Inc[employer] Facial Laceration  A facial laceration is a cut on the face. These injuries can be painful and cause bleeding. Lacerations usually heal quickly, but they need special care to reduce scarring. DIAGNOSIS  Your health care provider will take a medical history, ask for details about how the injury occurred, and examine the wound to determine how deep the cut is. TREATMENT  Some facial lacerations may not require closure. Others may not be able to be closed because of an increased risk of infection. The risk of infection and the chance for successful closure will depend on various factors, including the amount of time since the injury occurred. The wound may be cleaned to help prevent infection. If closure is appropriate, pain medicines may be given if needed. Your health care provider will use stitches (sutures), wound glue (adhesive), or skin adhesive strips to repair the laceration. These tools bring the skin edges together to allow for faster healing and a better cosmetic outcome. If needed, you may also be given a tetanus shot. HOME CARE INSTRUCTIONS  Only take over-the-counter or prescription medicines as directed by your health care provider.  Follow your health care provider's instructions for wound care. These instructions will vary depending on the technique used for closing the wound. For Sutures:  Keep the wound clean and dry.   If you were given a bandage (dressing), you should change it at least once a day. Also change the dressing if it becomes wet or dirty, or as directed by your health care provider.   Wash the wound with soap and water 2 times a day. Rinse the wound off with water to remove all soap. Pat the wound dry with a clean towel.    After cleaning, apply a thin layer of the antibiotic ointment recommended by your health care provider. This will help prevent infection and keep the dressing from sticking.   You may shower as usual after the first 24 hours. Do not soak the wound in water until the sutures are removed.   Get your sutures removed as directed by your health care provider. With facial lacerations, sutures should usually be taken out after 4-5 days to avoid stitch marks.   Wait a few days after your sutures are removed before applying any makeup. For Skin Adhesive Strips:  Keep the wound clean and dry.   Do not get the skin adhesive strips wet. You may bathe carefully, using caution to keep the wound dry.   If the wound gets wet, pat it dry with a clean towel.   Skin adhesive strips will fall off on their own. You may trim the strips as the wound heals. Do not remove skin adhesive strips that are still stuck to the wound. They will fall off in time.  For Wound Adhesive:  You may briefly wet your wound in the shower or bath. Do not soak  or scrub the wound. Do not swim. Avoid periods of heavy sweating until the skin adhesive has fallen off on its own. After showering or bathing, gently pat the wound dry with a clean towel.   Do not apply liquid medicine, cream medicine, ointment medicine, or makeup to your wound while the skin adhesive is in place. This may loosen the film before your wound is healed.   If a dressing is placed over the wound, be careful not to apply tape directly over the skin adhesive. This may cause the adhesive to be pulled off before the wound is healed.   Avoid prolonged exposure to sunlight or tanning lamps while the skin adhesive is in place.  The skin adhesive will usually remain in place for 5-10 days, then naturally fall off the skin. Do not pick at the adhesive film.  After Healing: Once the wound has healed, cover the wound with sunscreen during the day for 1 full  year. This can help minimize scarring. Exposure to ultraviolet light in the first year will darken the scar. It can take 1-2 years for the scar to lose its redness and to heal completely.  SEEK MEDICAL CARE IF:  You have a fever. SEEK IMMEDIATE MEDICAL CARE IF:  You have redness, pain, or swelling around the wound.   You see ayellowish-white fluid (pus) coming from the wound.    This information is not intended to replace advice given to you by your health care provider. Make sure you discuss any questions you have with your health care provider.   Document Released: 08/29/2004 Document Revised: 08/12/2014 Document Reviewed: 03/04/2013 Elsevier Interactive Patient Education 2016 Elsevier Inc. Abrasion An abrasion is a cut or scrape on the outer surface of your skin. An abrasion does not extend through all of the layers of your skin. It is important to care for your abrasion properly to prevent infection. CAUSES Most abrasions are caused by falling on or gliding across the ground or another surface. When your skin rubs on something, the outer and inner layer of skin rubs off.  SYMPTOMS A cut or scrape is the main symptom of this condition. The scrape may be bleeding, or it may appear red or pink. If there was an associated fall, there may be an underlying bruise. DIAGNOSIS An abrasion is diagnosed with a physical exam. TREATMENT Treatment for this condition depends on how large and deep the abrasion is. Usually, your abrasion will be cleaned with water and mild soap. This removes any dirt or debris that may be stuck. An antibiotic ointment may be applied to the abrasion to help prevent infection. A bandage (dressing) may be placed on the abrasion to keep it clean. You may also need a tetanus shot. HOME CARE INSTRUCTIONS Medicines  Take or apply medicines only as directed by your health care provider.  If you were prescribed an antibiotic ointment, finish all of it even if you start  to feel better. Wound Care  Clean the wound with mild soap and water 2-3 times per day or as directed by your health care provider. Pat your wound dry with a clean towel. Do not rub it.  There are many different ways to close and cover a wound. Follow instructions from your health care provider about:  Wound care.  Dressing changes and removal.  Check your wound every day for signs of infection. Watch for:  Redness, swelling, or pain.  Fluid, blood, or pus. General Instructions  Keep the dressing dry as  directed by your health care provider. Do not take baths, swim, use a hot tub, or do anything that would put your wound underwater until your health care provider approves.  If there is swelling, raise (elevate) the injured area above the level of your heart while you are sitting or lying down.  Keep all follow-up visits as directed by your health care provider. This is important. SEEK MEDICAL CARE IF:  You received a tetanus shot and you have swelling, severe pain, redness, or bleeding at the injection site.  Your pain is not controlled with medicine.  You have increased redness, swelling, or pain at the site of your wound. SEEK IMMEDIATE MEDICAL CARE IF:  You have a red streak going away from your wound.  You have a fever.  You have fluid, blood, or pus coming from your wound.  You notice a bad smell coming from your wound or your dressing.   This information is not intended to replace advice given to you by your health care provider. Make sure you discuss any questions you have with your health care provider.   Document Released: 05/01/2005 Document Revised: 04/12/2015 Document Reviewed: 07/20/2014 Elsevier Interactive Patient Education [employer].

## 2015-11-11 NOTE — ED Provider Notes (Signed)
CSN: [id number]     Arrival date & time 11/11/15  2011 History   First MD Initiated Contact with Patient 11/11/15 2013     Chief Complaint  Patient presents with  . Fall     (Consider location/radiation/quality/duration/timing/severity/associated sxs/prior Treatment) HPI Patient is chronically weak due to being on chemotherapy. She reports she got out of her vehicle and lost her balance falling on the cement sidewalk. She did not get knocked out. She reports she did fall onto her face. She reports she had a lot of bleeding from a laceration above her eyebrow. No confusion, nausea or vomiting. She denies neck pain. No focal weakness numbness or tinging to extremities. She reports her knees do feel bruised but she was able to walk on them. Past Medical History  Diagnosis Date  . Pneumonia 2009 & 2011  . Allergy     seasonal  . H/O reactive hypoglycemia   . Osteopenia   . IBS (irritable bowel syndrome)   . Bronchitis   . S/P radiation therapy completed 02/08/15    whole brain ,spine  . Brain cancer ([place]) 01/13/15    MRI multiple small brain mets 10 individual lesions  . Bone cancer ([place]) 01/10/15 PET    lytic lesion lt 1st rib,T9,L3 Lt acetabulum  . Lung cancer ([place])     stageIV nscca rul   Past Surgical History  Procedure Laterality Date  . Colonoscopy  2009    Negative; [facility]  . Tonsillectomy and adenoidectomy    . Wisdom tooth extraction    . Esophagogastroduodenoscopy (egd) with propofol N/A 04/11/2015    Procedure: ESOPHAGOGASTRODUODENOSCOPY (EGD) WITH PROPOFOL;  Surgeon: Carol Ada, MD;  Location: [facility];  Service: Endoscopy;  Laterality: N/A;  . Peg placement N/A 04/11/2015    Procedure: PERCUTANEOUS ENDOSCOPIC GASTROSTOMY (PEG) PLACEMENT;  Surgeon: Carol Ada, MD;  Location: [facility];  Service: Endoscopy;  Laterality: N/A;   Family History  Problem Relation Age of Onset  . Transient ischemic attack Mother   . Mental illness Mother     Dementia  . Diabetes  Father   . Heart attack Father 53  . Stomach cancer Maternal Aunt   . Heart attack Maternal Aunt     in 81s  . Diabetes Paternal Uncle     X 3  . Heart attack Maternal Grandfather     in 63s   Social History  Substance Use Topics  . Smoking status: Never Smoker   . Smokeless tobacco: Never Used  . Alcohol Use: Yes     Comment: occasionally rare wine   OB History    No data available     Review of Systems 10 Systems reviewed and are negative for acute change except as noted in the HPI.    Allergies  Sulfonamide derivatives  Home Medications   Prior to Admission medications   Medication Sig Start Date End Date Taking? Authorizing Provider  afatinib dimaleate (GILOTRIF) 30 MG tablet Take 1 tablet (30 mg total) by mouth daily. Take on an empty stomach 1hr before or 2 hrs after meals. 08/21/15  Yes Curt Bears, MD  ibuprofen (ADVIL,MOTRIN) 200 MG tablet Take 200 mg by mouth every 6 (six) hours as needed for moderate pain. Reported on 10/19/2015   Yes Historical Provider, MD  loperamide (IMODIUM) 2 MG capsule Take 2 mg by mouth as needed for diarrhea or loose stools (Up to 8 times daily, if needed.). Reported on 10/19/2015   Yes Historical Provider, MD  Nutritional  Supplements (FEEDING SUPPLEMENT, OSMOLITE 1.5 CAL,) LIQD Place 237 mLs into feeding tube 4 (four) times daily. Patient taking differently: Place 237 mLs into feeding tube 2 (two) times daily.  04/14/15  Yes Carol Ada, MD  OLANZapine (ZYPREXA) 10 MG tablet Take 10 mg by mouth at bedtime. 09/22/15  Yes Historical Provider, MD  bacitracin ointment Apply 1 application topically 2 (two) times daily. 11/11/15   Charlesetta Shanks, MD   BP 100/68 mmHg  Temp(Src) 98 F (36.7 C) (Oral)  Resp 20  Ht '5\' 7"'$  (1.702 m)  Wt 132 lb (59.875 kg)  BMI 20.67 kg/m2  SpO2 98% Physical Exam  Constitutional: She is oriented to person, place, and time.  Patient is alert and nontoxic. No confusion. No respiratory distress. She has obvious  facial abrasions.  HENT:  Laceration, complex to the right brow. 3 cm with angled flap. Through to the subcutaneous tissues. Mild hematoma. Abrasions over the bridge and tip of the nose and the chin  Eyes: EOM are normal. Pupils are equal, round, and reactive to light.  Neck: Neck supple.  No C-spine tenderness.  Cardiovascular: Normal rate, regular rhythm, normal heart sounds and intact distal pulses.   Pulmonary/Chest: Effort normal and breath sounds normal. She exhibits no tenderness.  Abdominal: Soft. She exhibits no distension. There is no tenderness.  Musculoskeletal: Normal range of motion.  Abrasions to both knees. No effusion. Mild ecchymosis to the left kneecap. Pain with range of motion of the left knee and left hip. Range of motion however is intact. Normal range of motion bilateral upper extremities  Neurological: She is alert and oriented to person, place, and time. No cranial nerve deficit. She exhibits normal muscle tone. Coordination normal.  Skin: Skin is warm and dry.  Psychiatric: She has a normal mood and affect.    ED Course  .Marland KitchenLaceration Repair Date/Time: 11/11/2015 10:42 PM Performed by: Charlesetta Shanks Authorized by: Charlesetta Shanks Consent: Verbal consent obtained. Body area: head/neck Location details: right eyebrow Laceration length: 3 cm Foreign bodies: no foreign bodies Tendon involvement: none Nerve involvement: none Vascular damage: no Anesthesia: local infiltration Local anesthetic: lidocaine 2% with epinephrine Anesthetic total: 3 ml Irrigation solution: saline Irrigation method: syringe Amount of cleaning: standard Debridement: minimal Degree of undermining: none Skin closure: 5-0 nylon Number of sutures: 6 Technique: complex Approximation: close Approximation difficulty: complex Patient tolerance: Patient tolerated the procedure well with no immediate complications Comments: A complex brow laceration with angled flap and variable depth.  Approximated well.   (including critical care time)  Labs Review Labs Reviewed - No data to display  Imaging Review Dg Knee 4 Views W/patella Left  11/11/2015  CLINICAL DATA:  [place] tonight. EXAM: LEFT KNEE - COMPLETE 4+ VIEW COMPARISON:  None. FINDINGS: There is no evidence of fracture, dislocation, or joint effusion. There is no evidence of arthropathy or other focal bone abnormality. Soft tissues are unremarkable. IMPRESSION: Negative. Electronically Signed   By: Andreas Newport M.D.   On: 11/11/2015 21:36   Dg Hip Unilat With Pelvis 2-3 Views Left  11/11/2015  CLINICAL DATA:  Fall.  History of lung cancer.  Knee GI abrasions. EXAM: DG HIP (WITH OR WITHOUT PELVIS) 2-3V LEFT COMPARISON:  None. FINDINGS: Chronic left superior and inferior pubic rami fractures identified peer the left hip appears intact. No acute fracture or subluxation noted. IMPRESSION: 1. No evidence for left hip fracture or dislocation. If there is high clinical suspicion for occult fracture or the patient refuses to weightbear, consider further  evaluation with MRI. Although CT is expeditious, evidence is lacking regarding accuracy of CT over plain film radiography. 2. Chronic left superior and inferior pubic rami fractures. Electronically Signed   By: Kerby Moors M.D.   On: 11/11/2015 21:38   I have personally reviewed and evaluated these images and lab results as part of my medical decision-making.   EKG Interpretation None      MDM   Final diagnoses:  Facial laceration, initial encounter  Abrasion, multiple sites  Fall, initial encounter   Patient had a mechanical fall due to generalized weakness. She did not have loss of consciousness. Complex brow laceration repaired. He should had multiple abrasions. No evidence of fracture or dislocation. A has no neurologic dysfunction. She denies any paresthesia or signs of cervical injury. No associated chest pain or abdominal pain. Follow-up instructions are provided.  Wound care instructions are provided.    Charlesetta Shanks, MD 11/11/15 [phone]

## 2015-11-11 NOTE — ED Notes (Signed)
MD at bedside. 

## 2015-11-14 ENCOUNTER — Telehealth: Payer: Self-pay | Admitting: *Deleted

## 2015-11-14 ENCOUNTER — Ambulatory Visit: Payer: BC Managed Care – PPO

## 2015-11-14 NOTE — Telephone Encounter (Signed)
Oncology Nurse Navigator Documentation  Oncology Nurse Navigator Flowsheets 11/14/2015  Navigator Encounter Type Telephone  Telephone Outgoing Call  Treatment Phase Treatment  Barriers/Navigation Needs Coordination of Care  Interventions Coordination of Care  Coordination of Care Appts  Acuity Level 2  Acuity Level 2 Assistance expediting appointments;Educational needs  Time Spent with Patient 30   I followed up on patient's molecular testing and did not see results.  I called pathology at [facility] and was told results should be back by Friday.  I updated Dr. Julien Nordmann.  He asked that I change her appt to Friday.  I called patient and spoke with her.  Appt is changed to Friday arrive at 8:45 with labs at 9:15 and appt at 9:30.

## 2015-11-15 ENCOUNTER — Ambulatory Visit
Admission: RE | Admit: 2015-11-15 | Discharge: 2015-11-15 | Disposition: A | Discharge status: Home / Self Care | Payer: BC Managed Care – PPO | Source: Ambulatory Visit | Attending: Radiation Oncology | Admitting: Radiation Oncology

## 2015-11-15 ENCOUNTER — Other Ambulatory Visit: Payer: BC Managed Care – PPO

## 2015-11-15 ENCOUNTER — Ambulatory Visit: Payer: BC Managed Care – PPO | Admitting: Internal Medicine

## 2015-11-15 DIAGNOSIS — Z510 Encounter for antineoplastic radiation therapy: Secondary | ICD-10-CM | POA: Diagnosis not present

## 2015-11-16 ENCOUNTER — Ambulatory Visit
Admission: RE | Admit: 2015-11-16 | Discharge: 2015-11-16 | Disposition: A | Discharge status: Home / Self Care | Payer: BC Managed Care – PPO | Source: Ambulatory Visit | Attending: Radiation Oncology | Admitting: Radiation Oncology

## 2015-11-16 DIAGNOSIS — Z510 Encounter for antineoplastic radiation therapy: Secondary | ICD-10-CM | POA: Diagnosis not present

## 2015-11-17 ENCOUNTER — Telehealth: Payer: Self-pay | Admitting: Internal Medicine

## 2015-11-17 ENCOUNTER — Encounter (HOSPITAL_COMMUNITY): Payer: Self-pay

## 2015-11-17 ENCOUNTER — Ambulatory Visit
Admission: RE | Admit: 2015-11-17 | Discharge: 2015-11-17 | Disposition: A | Discharge status: Home / Self Care | Payer: BC Managed Care – PPO | Source: Ambulatory Visit | Attending: Radiation Oncology | Admitting: Radiation Oncology

## 2015-11-17 ENCOUNTER — Ambulatory Visit (HOSPITAL_BASED_OUTPATIENT_CLINIC_OR_DEPARTMENT_OTHER): Payer: BC Managed Care – PPO | Admitting: Internal Medicine

## 2015-11-17 ENCOUNTER — Other Ambulatory Visit (HOSPITAL_BASED_OUTPATIENT_CLINIC_OR_DEPARTMENT_OTHER): Payer: BC Managed Care – PPO

## 2015-11-17 ENCOUNTER — Encounter: Payer: Self-pay | Admitting: Radiation Oncology

## 2015-11-17 ENCOUNTER — Encounter: Payer: Self-pay | Admitting: Internal Medicine

## 2015-11-17 VITALS — BP 115/70 | HR 90 | Temp 97.8°F | Resp 16 | Wt 133.3 lb

## 2015-11-17 VITALS — BP 110/64 | HR 91 | Temp 97.0°F | Resp 18 | Ht 67.0 in | Wt 132.4 lb

## 2015-11-17 DIAGNOSIS — R110 Nausea: Secondary | ICD-10-CM

## 2015-11-17 DIAGNOSIS — C7951 Secondary malignant neoplasm of bone: Secondary | ICD-10-CM

## 2015-11-17 DIAGNOSIS — E46 Unspecified protein-calorie malnutrition: Secondary | ICD-10-CM

## 2015-11-17 DIAGNOSIS — C778 Secondary and unspecified malignant neoplasm of lymph nodes of multiple regions: Secondary | ICD-10-CM | POA: Diagnosis not present

## 2015-11-17 DIAGNOSIS — C3491 Malignant neoplasm of unspecified part of right bronchus or lung: Secondary | ICD-10-CM

## 2015-11-17 DIAGNOSIS — C3411 Malignant neoplasm of upper lobe, right bronchus or lung: Secondary | ICD-10-CM

## 2015-11-17 DIAGNOSIS — Z510 Encounter for antineoplastic radiation therapy: Secondary | ICD-10-CM | POA: Insufficient documentation

## 2015-11-17 DIAGNOSIS — IMO0002 Reserved for concepts with insufficient information to code with codable children: Secondary | ICD-10-CM

## 2015-11-17 DIAGNOSIS — L03031 Cellulitis of right toe: Secondary | ICD-10-CM

## 2015-11-17 DIAGNOSIS — Z79899 Other long term (current) drug therapy: Secondary | ICD-10-CM | POA: Diagnosis not present

## 2015-11-17 DIAGNOSIS — L03032 Cellulitis of left toe: Secondary | ICD-10-CM

## 2015-11-17 DIAGNOSIS — C7931 Secondary malignant neoplasm of brain: Secondary | ICD-10-CM | POA: Diagnosis not present

## 2015-11-17 DIAGNOSIS — Z5111 Encounter for antineoplastic chemotherapy: Secondary | ICD-10-CM

## 2015-11-17 DIAGNOSIS — L03039 Cellulitis of unspecified toe: Secondary | ICD-10-CM

## 2015-11-17 HISTORY — DX: Reserved for concepts with insufficient information to code with codable children: IMO0002

## 2015-11-17 LAB — COMPREHENSIVE METABOLIC PANEL
ALBUMIN: 2.8 g/dL — AB (ref 3.5–5.0)
ALK PHOS: 61 U/L (ref 40–150)
ALT: 22 U/L (ref 0–55)
AST: 20 U/L (ref 5–34)
Anion Gap: 9 mEq/L (ref 3–11)
BUN: 30.3 mg/dL — ABNORMAL HIGH (ref 7.0–26.0)
CO2: 22 meq/L (ref 22–29)
CREATININE: 0.9 mg/dL (ref 0.6–1.1)
Calcium: 9 mg/dL (ref 8.4–10.4)
Chloride: 111 mEq/L — ABNORMAL HIGH (ref 98–109)
EGFR: 72 mL/min/{1.73_m2} — AB (ref >90)
GLUCOSE: 115 mg/dL (ref 70–140)
Potassium: 4 mEq/L (ref 3.5–5.1)
SODIUM: 142 meq/L (ref 136–145)
TOTAL PROTEIN: 6.8 g/dL (ref 6.4–8.3)

## 2015-11-17 LAB — CBC WITH DIFFERENTIAL/PLATELET
BASO%: 0.3 % (ref 0.0–2.0)
Basophils Absolute: 0 10*3/uL (ref 0.0–0.1)
EOS ABS: 0.1 10*3/uL (ref 0.0–0.5)
EOS%: 2.1 % (ref 0.0–7.0)
HCT: 31.1 % — ABNORMAL LOW (ref 34.8–46.6)
HEMOGLOBIN: 10.2 g/dL — AB (ref 11.6–15.9)
LYMPH%: 5.1 % — ABNORMAL LOW (ref 14.0–49.7)
MCH: 31 pg (ref 25.1–34.0)
MCHC: 32.8 g/dL (ref 31.5–36.0)
MCV: 94.6 fL (ref 79.5–101.0)
MONO#: 0.6 10*3/uL (ref 0.1–0.9)
MONO%: 8.7 % (ref 0.0–14.0)
NEUT%: 83.8 % — ABNORMAL HIGH (ref 38.4–76.8)
NEUTROS ABS: 5.3 10*3/uL (ref 1.5–6.5)
PLATELETS: 255 10*3/uL (ref 145–400)
RBC: 3.29 10*6/uL — AB (ref 3.70–5.45)
RDW: 12.8 % (ref 11.2–14.5)
WBC: 6.4 10*3/uL (ref 3.9–10.3)
lymph#: 0.3 10*3/uL — ABNORMAL LOW (ref 0.9–3.3)

## 2015-11-17 MED ORDER — RADIAPLEXRX EX GEL
Freq: Once | CUTANEOUS | Status: AC
  Administered 2015-11-17: 14:00:00 via TOPICAL

## 2015-11-17 NOTE — Progress Notes (Signed)
[facility] stated they need rx for formula name and method of feeding.  The last rx is Osmolyte 1.5 bolus 4 cans a day. Pt called a few months ago and said she wanted to decrease bolus feedings to twice a day. So I called in that order ,but  [facility] today stated they need orders .    Pt is 132 lbs and is eating 2 meals a day and bolus  2 cans a day.  She seems satisfied with this . Order faxed to [facility] nutrition dept

## 2015-11-17 NOTE — Progress Notes (Signed)
Department of Radiation Oncology  Phone:  [phone] Fax:        [phone]  Weekly Treatment Note    Name: Laura Walter Date: 11/17/2015 MRN: [id number] DOB: 1950/08/15   Diagnosis:     ICD-9-CM ICD-10-CM   1. Primary cancer of right lung (HCC) 162.9 C34.91 hyaluronate sodium (RADIAPLEXRX) gel  2. Malignant neoplasm of upper lobe of right lung (HCC) 162.3 C34.11      Current dose: 18.75 Gy  Current fraction: 3   MEDICATIONS: Current Outpatient Prescriptions  Medication Sig Dispense Refill  . afatinib dimaleate (GILOTRIF) 30 MG tablet Take 1 tablet (30 mg total) by mouth daily. Take on an empty stomach 1hr before or 2 hrs after meals. 30 tablet 2  . bacitracin ointment Apply 1 application topically 2 (two) times daily. 120 g 0  . hyaluronate sodium (RADIAPLEXRX) GEL Apply 1 application topically 2 (two) times daily.    Marland Kitchen ibuprofen (ADVIL,MOTRIN) 200 MG tablet Take 200 mg by mouth every 6 (six) hours as needed for moderate pain. Reported on 10/19/2015    . loperamide (IMODIUM) 2 MG capsule Take 2 mg by mouth as needed for diarrhea or loose stools (Up to 8 times daily, if needed.). Reported on 10/19/2015    . Nutritional Supplements (FEEDING SUPPLEMENT, OSMOLITE 1.5 CAL,) LIQD Place 237 mLs into feeding tube 4 (four) times daily. (Patient taking differently: Place 237 mLs into feeding tube 2 (two) times daily. ) 958 mL 12  . OLANZapine (ZYPREXA) 10 MG tablet Take 10 mg by mouth at bedtime.     No current facility-administered medications for this encounter.     ALLERGIES: Sulfonamide derivatives   LABORATORY DATA:  Lab Results  Component Value Date   WBC 6.4 11/17/2015   HGB 10.2* 11/17/2015   HCT 31.1* 11/17/2015   MCV 94.6 11/17/2015   PLT 255 11/17/2015   Lab Results  Component Value Date   NA 142 11/17/2015   K 4.0 11/17/2015   CL 108 04/12/2015   CO2 22 11/17/2015   Lab Results  Component Value Date   ALT 22 11/17/2015   AST 20 11/17/2015   ALKPHOS  61 11/17/2015   BILITOT <0.30 11/17/2015     NARRATIVE: Laura Walter was seen today for weekly treatment management. The chart was checked and the patient's films were reviewed.  Weekly rad txs lung, no c/o pain in chest, no difficulty swallowing ,no nuaea, soreness only on her face wher she had fallen, discussed reviewed side effects,  Throat changes, fatigue, skin irritation,  Appetite okay, takes osmolite  1 can, via peg 2x Free water before and after 20cc, fatigued, weakness.BP 115/70 mmHg  Pulse 90  Temp(Src) 97.8 F (36.6 C) (Oral)  Resp 16  Wt 133 lb 4.8 oz (60.464 kg)  Gave radiaplex gel  Wt Readings from Last 3 Encounters:  11/17/15 133 lb 4.8 oz (60.464 kg)  11/17/15 132 lb 6.4 oz (60.056 kg)  11/11/15 132 lb (59.875 kg)   2:54 PM y   PHYSICAL EXAMINATION: weight is 133 lb 4.8 oz (60.464 kg). Her oral temperature is 97.8 F (36.6 C). Her blood pressure is 115/70 and her pulse is 90. Her respiration is 16.      Decreased swelling in the facial region. Continued ecchymosis.  ASSESSMENT: The patient is doing satisfactorily with treatment. The patient's mother unfortunately died yesterday. She will miss one treatment next week which I believe is fine. We will make this up at the end.  PLAN: We will continue with the patient's radiation treatment as planned.

## 2015-11-17 NOTE — Progress Notes (Addendum)
Weekly rad txs lung, no c/o pain in chest, no difficulty swallowing ,no nuaea, soreness only on her face wher she had fallen, discussed reviewed side effects,  Throat changes, fatigue, skin irritation,  Appetite okay, takes osmolite  1 can, via peg 2x Free water before and after 20cc, fatigued, weakness.BP 115/70 mmHg  Pulse 90  Temp(Src) 97.8 F (36.6 C) (Oral)  Resp 16  Wt 133 lb 4.8 oz (60.464 kg)  Gave radiaplex gel  Wt Readings from Last 3 Encounters:  11/17/15 133 lb 4.8 oz (60.464 kg)  11/17/15 132 lb 6.4 oz (60.056 kg)  11/11/15 132 lb (59.875 kg)   1:38 PM y

## 2015-11-17 NOTE — Progress Notes (Signed)
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 Lisbeth Renshaw. 2) Gilotrif 40 mg by mouth daily. Status post 5 months of treatment.  CURRENT THERAPY:  1) Gilotrif 30 mg by mouth daily. First dose started 08/31/2015. Status post 6 weeks of treatment. 2) stereotactic radiotherapy to the enlarging right upper lobe lung mass under the care of Dr. Lisbeth Renshaw 3) Xgeva 120 mg subcutaneously every 4 weeks. First dose 05/01/2015.  INTERVAL HISTORY: Laura Walter 65 y.o. female returns to the clinic today for follow-up visit accompanied by her partner. The patient has been under a lot of stress recently. Her mother died last night. She also had a fall on her driveway and had several areas of bruises and ecchymosis on her right side of the face. She is currently undergoing stereotactic radiotherapy to the enlarging right upper lobe lung mass. She received 2 out of the scheduled 8 fractions. She is tolerating her treatment with Gilotrif 30 mg by mouth daily fairly well except for poor kneecap of the toes. She denied having any significant weight loss or night sweats. She denied having any significant nausea or vomiting. She denied having any significant chest pain, shortness of breath, cough or hemoptysis. Molecular studies from blood test as well as CT-guided core biopsy of the enlarging right upper lobe lung mass showed the initial EGFR deletion 19 mutation but no evidence of T790M resistant mutation. The patient is here today  for evaluation and discussion of her biopsy results and recommendation regarding her condition.  MEDICAL HISTORY: Past Medical History  Diagnosis Date  . Pneumonia 2009 & 2011  . Allergy     seasonal  . H/O reactive hypoglycemia   . Osteopenia   . IBS (irritable bowel syndrome)   . Bronchitis   . S/P radiation therapy completed 02/08/15    whole brain ,spine  . Brain cancer ([place]) 01/13/15    MRI multiple small brain mets 10 individual lesions  . Bone cancer ([place]) 01/10/15 PET    lytic lesion lt 1st rib,T9,L3 Lt acetabulum  . Lung cancer ([place])     stageIV nscca rul    ALLERGIES:  is allergic to sulfonamide derivatives.  MEDICATIONS:  Current Outpatient Prescriptions  Medication Sig Dispense Refill  . afatinib dimaleate (GILOTRIF) 30 MG tablet Take 1 tablet (30 mg total) by mouth daily. Take on an empty stomach 1hr before or 2 hrs after meals. 30 tablet 2  . bacitracin ointment Apply 1 application topically 2 (two) times daily. 120 g 0  . ibuprofen (ADVIL,MOTRIN) 200 MG tablet Take 200 mg by mouth every 6 (six) hours as needed for moderate pain. Reported on 10/19/2015    . loperamide (IMODIUM) 2 MG capsule Take 2 mg by mouth as needed for diarrhea or loose stools (Up to 8 times daily, if needed.). Reported on 10/19/2015    . Nutritional Supplements (FEEDING SUPPLEMENT, OSMOLITE 1.5 CAL,) LIQD Place 237 mLs into feeding tube 4 (four) times daily. (Patient taking differently: Place 237 mLs into feeding tube 2 (  two) times daily. ) 958 mL 12  . OLANZapine (ZYPREXA) 10 MG tablet Take 10 mg by mouth at bedtime.     No current facility-administered medications for this visit.    SURGICAL HISTORY:  Past Surgical History  Procedure Laterality Date  . Colonoscopy  2009    Negative; [facility]  . Tonsillectomy and adenoidectomy    . Wisdom tooth extraction    . Esophagogastroduodenoscopy (egd) with propofol N/A 04/11/2015    Procedure: ESOPHAGOGASTRODUODENOSCOPY (EGD) WITH PROPOFOL;  Surgeon:  Carol Ada, MD;  Location: [facility];  Service: Endoscopy;  Laterality: N/A;  . Peg placement N/A 04/11/2015    Procedure: PERCUTANEOUS ENDOSCOPIC GASTROSTOMY (PEG) PLACEMENT;  Surgeon: Carol Ada, MD;  Location: [facility];  Service: Endoscopy;  Laterality: N/A;    REVIEW OF SYSTEMS:  Constitutional: positive for fatigue Eyes: negative Ears, nose, mouth, throat, and face: negative Respiratory: negative Cardiovascular: negative Gastrointestinal: negative Genitourinary:negative Integument/breast: negative Hematologic/lymphatic: negative Musculoskeletal:negative Neurological: negative Behavioral/Psych: negative Endocrine: negative Allergic/Immunologic: negative   PHYSICAL EXAMINATION: General appearance: alert, cooperative, fatigued and no distress Head: Normocephalic, without obvious abnormality, Several areas of bruises and ecchymosis on the right side of the face Neck: no adenopathy, no JVD, supple, symmetrical, trachea midline and thyroid not enlarged, symmetric, no tenderness/mass/nodules Lymph nodes: Cervical, supraclavicular, and axillary nodes normal. Resp: clear to auscultation bilaterally Back: Tenderness to palpation at the lower thoracic and lumbar vertebrae. Cardio: regular rate and rhythm, S1, S2 normal, no murmur, click, rub or gallop GI: soft, non-tender; bowel sounds normal; no masses,  no organomegaly Extremities: extremities normal, atraumatic, no cyanosis or edema Neurologic: Alert and oriented X 3, normal strength and tone. Normal symmetric reflexes. Normal coordination and gait    ECOG PERFORMANCE STATUS: 1 - Symptomatic but completely ambulatory  Blood pressure 110/64, pulse 91, temperature 97 F (36.1 C), temperature source Axillary, resp. rate 18, height 5' 7"  (1.702 m), weight 132 lb 6.4 oz (60.056 kg), SpO2 99 %.  LABORATORY DATA: Lab Results  Component Value Date   WBC 6.4 11/17/2015   HGB 10.2* 11/17/2015   HCT 31.1* 11/17/2015   MCV 94.6  11/17/2015   PLT 255 11/17/2015      Chemistry      Component Value Date/Time   NA 139 10/16/2015 1118   NA 137 04/12/2015 0835   K 4.1 10/16/2015 1118   K 3.7 04/12/2015 0835   CL 108 04/12/2015 0835   CO2 25 10/16/2015 1118   CO2 22 04/12/2015 0835   BUN 19.5 10/16/2015 1118   BUN 17 04/12/2015 0835   CREATININE 0.8 10/16/2015 1118   CREATININE 0.81 04/12/2015 0835   CREATININE 0.83 11/02/2013 0832      Component Value Date/Time   CALCIUM 8.7 10/16/2015 1118   CALCIUM 8.4* 04/12/2015 0835   ALKPHOS 53 10/16/2015 1118   ALKPHOS 56 02/11/2015 0308   AST 15 10/16/2015 1118   AST 22 02/11/2015 0308   ALT 12 10/16/2015 1118   ALT 21 02/11/2015 0308   BILITOT 0.32 10/16/2015 1118   BILITOT 1.3* 02/11/2015 0308       RADIOGRAPHIC STUDIES: Mr Jeri Cos Wo Contrast  11/23/2015  CLINICAL DATA:  65 year old female with metastatic lung cancer status post whole brain radiation finished in July 2016. Restaging. Subsequent encounter. EXAM: MRI HEAD WITHOUT AND WITH CONTRAST TECHNIQUE: Multiplanar, multiecho pulse sequences of the brain and surrounding structures were obtained without and with intravenous contrast. CONTRAST:  31m MULTIHANCE GADOBENATE DIMEGLUMINE 529 MG/ML IV SOLN COMPARISON:  03/29/2015  and earlier. FINDINGS: Of the mostly punctate residual enhancing metastases demonstrated in August only 1 lesion remains faintly visible today (superior right insula series 11, image 32) and is punctate. The largest 5 mm left parietal lobe and right insula metastases seen in August have resolved. No new brain metastasis. No dural thickening. Progressed generalized Patchy and confluent cerebral white matter T2 and FLAIR hyperintensity is the sequelae of whole brain radiation. No midline shift, or mass effect. No restricted diffusion to suggest acute infarction. No ventriculomegaly, extra-axial collection or acute intracranial hemorrhage. Cervicomedullary junction and pituitary are within normal  limits. Major intracranial vascular flow voids are stable. Visible bone marrow signal is stable and within normal limits. Negative visualized cervical spine and spinal cord. Left mastoid effusion has regressed. Negative nasopharynx. Mild paranasal sinus mucosal thickening today. Negative orbit and scalp soft tissues. IMPRESSION: 1. Essentially resolved brain metastases following whole brain radiation in 2016. Only 1 of the previously-seen metastases is faintly visible following contrast today on series 11, image 32. 2. Cerebral white matter sequelae of whole brain radiation. Electronically Signed   By: Genevie Ann M.D.   On: 10/27/2015 13:19   Ct Biopsy  11/06/2015  CLINICAL DATA:  Enlarging right upper lobe lesion. Previous biopsy demonstrated carcinoma. Molecular testing requested. EXAM: CT GUIDED CORE BIOPSY OF RIGHT UPPER LOBE LUNG MASS ANESTHESIA/SEDATION: Intravenous Fentanyl and Versed were administered as conscious sedation during continuous monitoring of the patient's level of consciousness and physiological / cardiorespiratory status by the radiology RN, with a total moderate sedation time of 10 minutes. PROCEDURE: The procedure risks, benefits, and alternatives were explained to the patient. Questions regarding the procedure were encouraged and answered. The patient understands and consents to the procedure. Limited axial scans are obtained through the thorax were obtained to localize the lesion. A variety of positions were scanned until a safe percutaneous access was determined. Skin site was marked. The operative field was prepped with chlorhexidinein a sterile fashion, and a sterile drape was applied covering the operative field. A sterile gown and sterile gloves were used for the procedure. Local anesthesia was provided with 1% Lidocaine. Under CT fluoroscopic guidance, a 17 gauge trocar needle was advanced to the margin of the lesion. Once needle tip position was confirmed, coaxial 18-gauge core biopsy  samples were obtained, submitted in formalin to surgical pathology. The guide needle was removed. Postprocedure scans show no hemorrhage, pneumothorax, or other apparent complication. The patient tolerated the procedure well. COMPLICATIONS: None immediate FINDINGS: Posterior right upper lobe lung mass again identified. Percutaneous core biopsy samples obtained under CT fluoroscopic guidance. IMPRESSION: 1. Technically successful CT-guided core biopsy, right upper lobe lung mass. Electronically Signed   By: Lucrezia Europe M.D.   On: 11/06/2015 10:39   Dg Knee 4 Views W/patella Left  11/11/2015  CLINICAL DATA:  [place] tonight. EXAM: LEFT KNEE - COMPLETE 4+ VIEW COMPARISON:  None. FINDINGS: There is no evidence of fracture, dislocation, or joint effusion. There is no evidence of arthropathy or other focal bone abnormality. Soft tissues are unremarkable. IMPRESSION: Negative. Electronically Signed   By: Andreas Newport M.D.   On: 11/11/2015 21:36   Dg Hip Unilat With Pelvis 2-3 Views Left  11/11/2015  CLINICAL DATA:  Fall.  History of lung cancer.  Knee GI abrasions. EXAM: DG HIP (WITH OR WITHOUT PELVIS) 2-3V LEFT COMPARISON:  None. FINDINGS: Chronic left superior and inferior pubic rami fractures identified peer the left hip appears intact. No acute fracture or subluxation noted. IMPRESSION: 1. No  evidence for left hip fracture or dislocation. If there is high clinical suspicion for occult fracture or the patient refuses to weightbear, consider further evaluation with MRI. Although CT is expeditious, evidence is lacking regarding accuracy of CT over plain film radiography. 2. Chronic left superior and inferior pubic rami fractures. Electronically Signed   By: Kerby Moors M.D.   On: 11/11/2015 21:38    ASSESSMENT AND PLAN: This is a very pleasant 65 years old never smoker white female recently diagnosed with stage IV non-small cell lung cancer, adenocarcinoma with positive EGFR mutation with deletion in exon 19  presented with large right upper lobe lung mass in addition to bilateral mediastinal, supraclavicular lymphadenopathy as well as bone and multiple brain metastasis diagnosed in June 2016.  The patient completed whole brain irradiation under the care of Dr. Lisbeth Renshaw. She was started on treatment with Gilotrif 40 mg by mouth daily status post 5 months but this was discontinued secondary to intolerance with significant bony contact, skin rash in the sacral area as well as few episodes of diarrhea  The patient was started on Gilotrif 30 mg by mouth daily since 08/31/2015. She is tolerating it much better with the reduced dose was less adverse effects. Unfortunately the recent CT scan of the chest, abdomen and pelvis showed interval increase in the size of the right upper lobe lung mass compatible with disease progression. She is currently undergoing stereotactic radiotherapy to the enlarging right upper lobe lung mass. Molecular studies from blood test as well as CT-guided core biopsy of the enlarging right upper lobe lung mass showed no evidence for T790M resistant mutation. I recommended for the patient to continue Gilotrif with the same dose for now. For the metastatic bone disease, I continue the patient on Xgeva 120 mg subcutaneously on monthly basis. The patient was also advised to increased her intake of calcium supplement and vitamin D. For the nausea, she will continue on Zyprexa 10 mg by mouth daily at bedtime.  For the paronychia, I recommended for the patient to see a podiatrist for evaluation and management of her condition. The patient would come back for follow-up visit in one month with repeat blood work. She was advised to call immediately if she has any concerning symptoms in the interval. The patient voices understanding of current disease status and treatment options and is in agreement with the current care plan.  All questions were answered. The patient knows to call the clinic with any  problems, questions or concerns. We can certainly see the patient much sooner if necessary.  Disclaimer: This note was dictated with voice recognition software. Similar sounding words can inadvertently be transcribed and may not be corrected upon review.  Paronychia

## 2015-11-17 NOTE — Telephone Encounter (Signed)
per pof to sch pt appt-gave pt copy of avs °

## 2015-11-20 ENCOUNTER — Other Ambulatory Visit: Payer: Self-pay | Admitting: Internal Medicine

## 2015-11-20 ENCOUNTER — Ambulatory Visit
Admission: RE | Admit: 2015-11-20 | Discharge: 2015-11-20 | Disposition: A | Discharge status: Home / Self Care | Payer: BC Managed Care – PPO | Source: Ambulatory Visit | Attending: Radiation Oncology | Admitting: Radiation Oncology

## 2015-11-20 DIAGNOSIS — Z510 Encounter for antineoplastic radiation therapy: Secondary | ICD-10-CM | POA: Diagnosis not present

## 2015-11-21 ENCOUNTER — Ambulatory Visit: Payer: BC Managed Care – PPO | Admitting: Radiation Oncology

## 2015-11-22 ENCOUNTER — Ambulatory Visit
Admission: RE | Admit: 2015-11-22 | Discharge: 2015-11-22 | Disposition: A | Discharge status: Home / Self Care | Payer: BC Managed Care – PPO | Source: Ambulatory Visit | Attending: Radiation Oncology | Admitting: Radiation Oncology

## 2015-11-22 DIAGNOSIS — Z510 Encounter for antineoplastic radiation therapy: Secondary | ICD-10-CM | POA: Diagnosis not present

## 2015-11-23 ENCOUNTER — Ambulatory Visit: Payer: BC Managed Care – PPO

## 2015-11-23 ENCOUNTER — Ambulatory Visit
Admission: RE | Admit: 2015-11-23 | Discharge: 2015-11-23 | Disposition: A | Discharge status: Home / Self Care | Payer: BC Managed Care – PPO | Source: Ambulatory Visit | Attending: Radiation Oncology | Admitting: Radiation Oncology

## 2015-11-23 DIAGNOSIS — M25611 Stiffness of right shoulder, not elsewhere classified: Secondary | ICD-10-CM

## 2015-11-23 DIAGNOSIS — Z510 Encounter for antineoplastic radiation therapy: Secondary | ICD-10-CM | POA: Diagnosis not present

## 2015-11-23 DIAGNOSIS — M6281 Muscle weakness (generalized): Secondary | ICD-10-CM | POA: Diagnosis not present

## 2015-11-23 DIAGNOSIS — R293 Abnormal posture: Secondary | ICD-10-CM

## 2015-11-23 DIAGNOSIS — M25511 Pain in right shoulder: Secondary | ICD-10-CM

## 2015-11-23 DIAGNOSIS — M25512 Pain in left shoulder: Secondary | ICD-10-CM

## 2015-11-23 DIAGNOSIS — M25612 Stiffness of left shoulder, not elsewhere classified: Secondary | ICD-10-CM

## 2015-11-23 NOTE — Therapy (Signed)
[place], [place], [address] Phone: [phone]   Fax:  [phone]  Physical Therapy Treatment  Patient Details  Name: LENNOX LEIKAM MRN: [id number] Date of Birth: Apr 10, 1951 Referring Provider: Shona Simpson  Encounter Date: 11/23/2015      PT End of Session - 11/23/15 0936    Visit Number 2   Number of Visits 17   Date for PT Re-Evaluation 01/04/16   PT Start Time 0851   PT Stop Time 0933   PT Time Calculation (min) 42 min   Activity Tolerance Patient tolerated treatment well   Behavior During Therapy [facility] for tasks assessed/performed      Past Medical History  Diagnosis Date  . Pneumonia 2009 & 2011  . Allergy     seasonal  . H/O reactive hypoglycemia   . Osteopenia   . IBS (irritable bowel syndrome)   . Bronchitis   . S/P radiation therapy completed 02/08/15    whole brain ,spine  . Brain cancer ([place]) 01/13/15    MRI multiple small brain mets 10 individual lesions  . Bone cancer ([place]) 01/10/15 PET    lytic lesion lt 1st rib,T9,L3 Lt acetabulum  . Lung cancer ([place])     stageIV nscca rul  . Paronychia 11/17/2015    Past Surgical History  Procedure Laterality Date  . Colonoscopy  2009    Negative; [facility]  . Tonsillectomy and adenoidectomy    . Wisdom tooth extraction    . Esophagogastroduodenoscopy (egd) with propofol N/A 04/11/2015    Procedure: ESOPHAGOGASTRODUODENOSCOPY (EGD) WITH PROPOFOL;  Surgeon: Carol Ada, MD;  Location[facility];  Service: Endoscopy;  Laterality: N/A;  . Peg placement N/A 04/11/2015    Procedure: PERCUTANEOUS ENDOSCOPIC GASTROSTOMY (PEG) PLACEMENT;  Surgeon: Carol Ada, MD;  Location: [facility];  Service: Endoscopy;  Laterality: N/A;    There were no vitals filed for this visit.      Subjective Assessment - 11/23/15 0855    Subjective I fell since the last time I was here and had to get 6 stitches in my Rt eyebrow. I was walking on the driveway  which is a steep incline and my legs were weak and I just went down. This was 11/12/15. I feel fine from that today, just really weak overall, nothing new.    Patient is accompained by: --  friend, [name]   Pertinent History This is a very pleasant 65 years old never smoker white female recently diagnosed with stage IV non-small cell lung cancer, adenocarcinoma with positive EGFR mutation with deletion in exon 19 presented with large right upper lobe lung mass in addition to bilateral mediastinal, supraclavicular lymphadenopathy as well as bone and multiple brain metastasis diagnosed in June 2016. Pt now to undergo radiation to R lung.    Patient Stated Goals increase strength of bilateral LEs and UEs, stand from a chair easier   Currently in Pain? No/denies                         [facility] Adult PT Treatment/Exercise - 11/23/15 0001    Shoulder Exercises: Supine   Horizontal ABduction Strengthening;5 reps;Left  5 second holds   Horizontal ABduction Limitations Unable to stretch to Rt due to limited space in room   External Rotation AAROM;Both;5 reps  5 second holds   External Rotation Limitations Some pain with this   Flexion AAROM;Both;5 reps  5 second holds   Manual Therapy  Passive ROM In Supine to bil into flexion, abduction, external and internal rotation all gently and to pts tolerance.                 PT Education - 11/23/15 [phone]    Education provided Yes   Education Details Supine cane exercises and instructed pts friend [name] how to perform gentle PROM to bil shoulders   Person(s) Educated Patient   Methods Explanation;Demonstration;Handout   Comprehension Verbalized understanding;Returned demonstration           Short Term Clinic Goals - 11/23/15 0943    CC Short Term Goal  #1   Title Pt to demonstrate 105 degrees of shoulder flexion bilaterally to allow pt to reach items on shelves.    Status On-going   CC Short Term Goal  #2   Title Pt to  demonstrate 90 degrees of shoulder abduction bilaterally to allow pt to reach out to sides   Status On-going   CC Short Term Goal  #3   Title Pt to demonstrate 3+/5 tricep strength to allow pt to push to stand without pain   Status On-going   CC Short Term Goal  #4   Title Pt to demonstate 4/5 hip flexor strength to decrease fatigue during ambulation   Status On-going             [facility] Goals - 11/09/15 1618    CC Long Term Goal  #1   Title Pt to demonstate 160 degrees of bilateral shoulder flexion to allow pt to reach overhead   Baseline R 76, L 85   Time 8   Period Weeks   Status New   CC Long Term Goal  #2   Title Pt to demonstrate 160 degrees of bilateral shoulder abduction to allow pt to reach items out to sides   Baseline R 70, L 60   Time 8   Period Weeks   Status New   CC Long Term Goal  #3   Title Pt to demonstrate gross shoulder strength of 4/5 to allow pt to return to prior level of function   Baseline 2+/5   Time 8   Period Weeks   Status New   CC Long Term Goal  #4   Title Pt to be independent in a home exercise program for strengthening, ROM and posture for long term management   Baseline NA   Time 8   Period Weeks   Status New   CC Long Term Goal  #5   Title Pt to improve quick DASH score to less than or equal to 30% impairment   Baseline 50%   Time 8   Period Weeks   Status New            Plan - 11/23/15 9242    Clinical Impression Statement Pt tolerated first session today very well. She had a fall in her driveway 63/8/34 and had to go to hospital for stitches to her Rt eyebrow but had no other injuries. She has no redness or swelling otday and pt reports 3 stitches have already come out, she is healing well from this. Contiued today per PT initial poc with beginning PROM and issuing AAROM HEP which pt tolerated well and followed instructions to not push into pain. her freind [name] was also instructed in how to safely perform PROM to  bil shoulders and her and pt did this well together.     Rehab Potential Good   Clinical  Impairments Affecting Rehab Potential pt to begin radiation to lung , pt had fall in driveway 63/3/79   PT Frequency 2x / week   PT Duration 8 weeks   PT Treatment/Interventions Therapeutic exercise;Therapeutic activities;Taping;Electrical Stimulation;Passive range of motion;Patient/family education   PT Next Visit Plan Measure ROM next visit. Cont PROM to bilateral shoulders, begin posture exercises, review ROM exercises for bilateral shoulders prn.    PT Home Exercise Plan see education section   Consulted and Agree with Plan of Care Patient      Patient will benefit from skilled therapeutic intervention in order to improve the following deficits and impairments:  Postural dysfunction, Decreased range of motion, Impaired UE functional use, Pain, Decreased strength, Decreased activity tolerance, Decreased endurance  Visit Diagnosis: Muscle weakness (generalized)  Right shoulder pain  Left shoulder pain  Limitation of joint motion of right shoulder  Abnormal posture  Limitation of joint motion of left shoulder     Problem List Patient Active Problem List   Diagnosis Date Noted  . Paronychia 11/17/2015  . Paronychia of great toe, right 08/21/2015  . Paronychia of great toe, left 08/21/2015  . Nausea without vomiting 04/24/2015  . Feeding difficulties 04/11/2015  . Encounter for antineoplastic chemotherapy 03/06/2015  . Hypokalemia 02/15/2015  . Protein-calorie malnutrition ([place]) 02/15/2015  . Neutropenic fever ([place]) 02/11/2015  . Neutropenia with fever ([place]) 02/11/2015  . Bicytopenia 02/11/2015  . Diarrhea 02/11/2015  . Malignant neoplasm of upper lobe of right lung ([place]) 01/05/2015  . 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 Bone metastases ([place]) 01/03/2015  . Physical exam 10/13/2014  . Osteopenia 10/07/2013  . Benign positional vertigo 05/08/2012  . TINNITUS, LEFT 01/09/2009  .  CARDIAC MURMUR, AORTIC 01/05/2007  . MOTION SICKNESS 01/05/2007    Otelia Limes, PTA 11/23/2015, 9:44 AM  [place], [place], [address] Phone: [phone]   Fax:  [phone]  Name: QUINCIE HAROON MRN: [id number] Date of Birth: 1950-09-26

## 2015-11-23 NOTE — Patient Instructions (Signed)
Cancer Rehab [phone]  SHOULDER: External Rotation - Supine (Cane)   Hold cane with both hands. Rotate arm away from body. Keep elbow on floor and next to body. Hold 5 seconds, __5-10_ reps, _2__ sets per day. Add towel to keep elbow at side.  Copyright  VHI. All rights reserved.  Cane Horizontal - Supine   With straight arms holding cane above shoulders, bring cane out to right, center, out to left, and back to above head. Repeat _5-10__ times. Do _2__ times per day.  Copyright  VHI. All rights reserved.  Cane Exercise: Flexion   Lie on back, holding cane above chest. Keeping arms as straight as possible, lower cane toward floor beyond head. Hold _5___ seconds. Repeat _5-10___ times. Do _2___ sessions per day.  http://gt2.exer.us/91   Copyright  VHI. All rights reserved.

## 2015-11-24 ENCOUNTER — Ambulatory Visit
Admission: RE | Admit: 2015-11-24 | Discharge: 2015-11-24 | Disposition: A | Discharge status: Home / Self Care | Payer: BC Managed Care – PPO | Source: Ambulatory Visit | Attending: Radiation Oncology | Admitting: Radiation Oncology

## 2015-11-24 ENCOUNTER — Encounter: Payer: Self-pay | Admitting: Radiation Oncology

## 2015-11-24 VITALS — BP 119/76 | HR 89 | Temp 97.8°F | Ht 67.0 in | Wt 132.4 lb

## 2015-11-24 DIAGNOSIS — Z510 Encounter for antineoplastic radiation therapy: Secondary | ICD-10-CM | POA: Diagnosis not present

## 2015-11-24 DIAGNOSIS — C3411 Malignant neoplasm of upper lobe, right bronchus or lung: Secondary | ICD-10-CM

## 2015-11-24 NOTE — Progress Notes (Signed)
Laura Walter has completed 7 fractions to her right upper lung.  She denies having pain and shortness of breath.  She reports having an occasional dry cough.  She reports feeling fatigued.  She is taking Gilotrif.  She has been given a one month follow up appointment.  BP 119/76 mmHg  Pulse 89  Temp(Src) 97.8 F (36.6 C) (Axillary)  Ht '5\' 7"'$  (1.702 m)  Wt 132 lb 6.4 oz (60.056 kg)  BMI 20.73 kg/m2  SpO2 100%   Wt Readings from Last 3 Encounters:  11/24/15 132 lb 6.4 oz (60.056 kg)  11/17/15 133 lb 4.8 oz (60.464 kg)  11/17/15 132 lb 6.4 oz (60.056 kg)

## 2015-11-24 NOTE — Progress Notes (Signed)
Department of Radiation Oncology  Phone:  [phone] Fax:        [phone]  Weekly Treatment Note    Name: Laura Walter Date: 11/24/2015 MRN: [id number] DOB: Apr 03, 1951   Diagnosis:     ICD-9-CM ICD-10-CM   1. Malignant neoplasm of upper lobe of right lung (HCC) 162.3 C34.11      Current dose: 43.75 Gy  Current fraction: 7   MEDICATIONS: Current Outpatient Prescriptions  Medication Sig Dispense Refill  . afatinib dimaleate (GILOTRIF) 30 MG tablet Take 1 tablet (30 mg total) by mouth daily. Take on an empty stomach 1hr before or 2 hrs after meals. 30 tablet 2  . ibuprofen (ADVIL,MOTRIN) 200 MG tablet Take 200 mg by mouth every 6 (six) hours as needed for moderate pain. Reported on 10/19/2015    . loperamide (IMODIUM) 2 MG capsule Take 2 mg by mouth as needed for diarrhea or loose stools (Up to 8 times daily, if needed.). Reported on 10/19/2015    . Nutritional Supplements (FEEDING SUPPLEMENT, OSMOLITE 1.5 CAL,) LIQD Place 237 mLs into feeding tube 4 (four) times daily. (Patient taking differently: Place 237 mLs into feeding tube 2 (two) times daily. ) 958 mL 12  . OLANZapine (ZYPREXA) 10 MG tablet TAKE 1 TABLET (10 MG TOTAL) BY MOUTH AT BEDTIME. 30 tablet 0  . bacitracin ointment Apply 1 application topically 2 (two) times daily. (Patient not taking: Reported on 11/24/2015) 120 g 0  . hyaluronate sodium (RADIAPLEXRX) GEL Apply 1 application topically 2 (two) times daily. Reported on 11/24/2015    . OLANZapine (ZYPREXA) 10 MG tablet Take 10 mg by mouth at bedtime. Reported on 11/24/2015     No current facility-administered medications for this encounter.     ALLERGIES: Sulfonamide derivatives   LABORATORY DATA:  Lab Results  Component Value Date   WBC 6.4 11/17/2015   HGB 10.2* 11/17/2015   HCT 31.1* 11/17/2015   MCV 94.6 11/17/2015   PLT 255 11/17/2015   Lab Results  Component Value Date   NA 142 11/17/2015   K 4.0 11/17/2015   CL 108 04/12/2015   CO2 22  11/17/2015   Lab Results  Component Value Date   ALT 22 11/17/2015   AST 20 11/17/2015   ALKPHOS 61 11/17/2015   BILITOT <0.30 11/17/2015     NARRATIVE: Laura Walter was seen today for weekly treatment management. The chart was checked and the patient's films were reviewed.  Laura Walter has completed 7 fractions to her right upper lung.  She denies having pain and shortness of breath.  She reports having an occasional dry cough.  She reports feeling fatigued.  She is taking Gilotrif.  She has been given a one month follow up appointment.  BP 119/76 mmHg  Pulse 89  Temp(Src) 97.8 F (36.6 C) (Axillary)  Ht '5\' 7"'$  (1.702 m)  Wt 132 lb 6.4 oz (60.056 kg)  BMI 20.73 kg/m2  SpO2 100%   Wt Readings from Last 3 Encounters:  11/24/15 132 lb 6.4 oz (60.056 kg)  11/17/15 133 lb 4.8 oz (60.464 kg)  11/17/15 132 lb 6.4 oz (60.056 kg)    PHYSICAL EXAMINATION: height is '5\' 7"'$  (1.702 m) and weight is 132 lb 6.4 oz (60.056 kg). Her axillary temperature is 97.8 F (36.6 C). Her blood pressure is 119/76 and her pulse is 89. Her oxygen saturation is 100%.        ASSESSMENT: The patient is doing satisfactorily with treatment.  PLAN: We will  continue with the patient's radiation treatment as planned. The patient continues to do very well. She will finish her final fraction on Monday.

## 2015-11-27 ENCOUNTER — Ambulatory Visit
Admission: RE | Admit: 2015-11-27 | Discharge: 2015-11-27 | Disposition: A | Discharge status: Home / Self Care | Payer: BC Managed Care – PPO | Source: Ambulatory Visit | Attending: Radiation Oncology | Admitting: Radiation Oncology

## 2015-11-27 DIAGNOSIS — Z510 Encounter for antineoplastic radiation therapy: Secondary | ICD-10-CM | POA: Diagnosis not present

## 2015-11-28 ENCOUNTER — Ambulatory Visit: Payer: BC Managed Care – PPO

## 2015-11-28 DIAGNOSIS — M25511 Pain in right shoulder: Secondary | ICD-10-CM

## 2015-11-28 DIAGNOSIS — M25612 Stiffness of left shoulder, not elsewhere classified: Secondary | ICD-10-CM

## 2015-11-28 DIAGNOSIS — M6281 Muscle weakness (generalized): Secondary | ICD-10-CM

## 2015-11-28 DIAGNOSIS — M25512 Pain in left shoulder: Secondary | ICD-10-CM

## 2015-11-28 DIAGNOSIS — M25611 Stiffness of right shoulder, not elsewhere classified: Secondary | ICD-10-CM

## 2015-11-28 DIAGNOSIS — R293 Abnormal posture: Secondary | ICD-10-CM

## 2015-11-28 NOTE — Therapy (Signed)
[place], [place], [address] Phone: [phone]   Fax:  [phone]  Physical Therapy Treatment  Patient Details  Name: Laura Walter MRN: [id number] Date of Birth: [birth year] Referring Provider: Shona Simpson  Encounter Date: 11/28/2015      PT End of Session - 11/28/15 1110    Visit Number 3   Number of Visits 17   Date for PT Re-Evaluation 01/04/16   PT Start Time 8756   PT Stop Time 1104   PT Time Calculation (min) 41 min   Activity Tolerance Patient tolerated treatment well   Behavior During Therapy [facility] for tasks assessed/performed      Past Medical History  Diagnosis Date  . Pneumonia 2009 & 2011  . Allergy     seasonal  . H/O reactive hypoglycemia   . Osteopenia   . IBS (irritable bowel syndrome)   . Bronchitis   . S/P radiation therapy completed 02/08/15    whole brain ,spine  . Brain cancer ([place]) 01/13/15    MRI multiple small brain mets 10 individual lesions  . Bone cancer ([place]) 01/10/15 PET    lytic lesion lt 1st rib,T9,L3 Lt acetabulum  . Lung cancer ([place])     stageIV nscca rul  . Paronychia 11/17/2015    Past Surgical History  Procedure Laterality Date  . Colonoscopy  2009    Negative; [facility]  . Tonsillectomy and adenoidectomy    . Wisdom tooth extraction    . Esophagogastroduodenoscopy (egd) with propofol N/A 04/11/2015    Procedure: ESOPHAGOGASTRODUODENOSCOPY (EGD) WITH PROPOFOL;  Surgeon: Carol Ada, MD;  Location: [facility];  Service: Endoscopy;  Laterality: N/A;  . Peg placement N/A 04/11/2015    Procedure: PERCUTANEOUS ENDOSCOPIC GASTROSTOMY (PEG) PLACEMENT;  Surgeon: Carol Ada, MD;  Location: [facility];  Service: Endoscopy;  Laterality: N/A;    There were no vitals filed for this visit.      Subjective Assessment - 11/28/15 1025    Subjective I've been doing okay since last visit. Didn't have any increased pain and I lost the sheet of exercises you  gave me but I've been trying to do them from memory.    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 Pt now to undergo radiation to R lung.    Patient Stated Goals increase strength of bilateral LEs and UEs, stand from a chair easier   Currently in Pain? No/denies                         [facility] Adult PT Treatment/Exercise - 11/28/15 0001    Shoulder Exercises: Supine   Horizontal ABduction Strengthening;5 reps;Left  5 second holds and 2 sets   External Rotation AAROM;Both;5 reps  5 second holds and 2 sets    External Rotation Limitations Some pain at end ROM   Flexion AAROM;Both;5 reps  5 second holds and 2 sets   Other Supine Exercises Meeks Decompression Exercises all 5 reps for 5 seconds holds.    Manual Therapy   Passive ROM In Supine to bil into flexion, abduction, external and internal rotation all gently and to pts tolerance.  PT Education - 11/28/15 1031    Education provided Yes   Education Details Reissued cane exercises from last visit and reviewed with pt. Also instructed and issued Meeks Decompression exercises   Person(s) Educated Patient   Methods Explanation;Demonstration;Handout   Comprehension Verbalized understanding;Returned demonstration           [facility] Term Clinic Goals - 11/23/15 0943    CC Short Term Goal  #1   Title Pt to demonstrate 105 degrees of shoulder flexion bilaterally to allow pt to reach items on shelves.    Status On-going   CC Short Term Goal  #2   Title Pt to demonstrate 90 degrees of shoulder abduction bilaterally to allow pt to reach out to sides    Status On-going   CC Short Term Goal  #3   Title Pt to demonstrate 3+/5 tricep strength to allow pt to push to stand without pain   Status On-going   CC Short Term Goal  #4   Title Pt to demonstate 4/5 hip flexor strength to decrease fatigue during ambulation   Status On-going             [facility] Goals - 11/09/15 1618    CC Long Term Goal  #1   Title Pt to demonstate 160 degrees of bilateral shoulder flexion to allow pt to reach overhead   Baseline R 76, L 85   Time 8   Period Weeks   Status New   CC Long Term Goal  #2   Title Pt to demonstrate 160 degrees of bilateral shoulder abduction to allow pt to reach items out to sides   Baseline R 70, L 60   Time 8   Period Weeks   Status New   CC Long Term Goal  #3   Title Pt to demonstrate gross shoulder strength of 4/5 to allow pt to return to prior level of function   Baseline 2+/5   Time 8   Period Weeks   Status New   CC Long Term Goal  #4   Title Pt to be independent in a home exercise program for strengthening, ROM and posture for long term management   Baseline NA   Time 8   Period Weeks   Status New   CC Long Term Goal  #5   Title Pt to improve quick DASH score to less than or equal to 30% impairment   Baseline 50%   Time 8   Period Weeks   Status New            Plan - 11/28/15 1111    Clinical Impression Statement Pt did well today and tolerated stretching better than last session, relaxing better allowing for slightly increased PROM and reported less pain, though does still c/o some with bil end range ER and abduction. She did well with review of AAROM cane exercises needing only minor verbal reminders for correct technique, and did well with new Meeks Decompression exercises issued today. No increased pain after session.    Rehab Potential Good   Clinical Impairments Affecting Rehab Potential pt to begin radiation to lung , pt had fall in driveway 02/11/88   PT Frequency 2x / week   PT Duration 8  weeks   PT Treatment/Interventions Therapeutic exercise;Therapeutic activities;Taping;Electrical Stimulation;Passive range of motion;Patient/family education   PT Next Visit Plan MEASURE ROM NEXT VISIT and assess. Cont PROM to bilateral shoulders, begin posture exercises, review HEP prn.    Consulted and Agree  with Plan of Care Patient      Patient will benefit from skilled therapeutic intervention in order to improve the following deficits and impairments:  Postural dysfunction, Decreased range of motion, Impaired UE functional use, Pain, Decreased strength, Decreased activity tolerance, Decreased endurance  Visit Diagnosis: Muscle weakness (generalized)  Right shoulder pain  Left shoulder pain  Limitation of joint motion of right shoulder  Abnormal posture  Limitation of joint motion of left shoulder     Problem List Patient Active Problem List   Diagnosis Date Noted  . Paronychia 11/17/2015  . Paronychia of great toe, right 08/21/2015  . Paronychia of great toe, left 08/21/2015  . Nausea without vomiting 04/24/2015  . Feeding difficulties 04/11/2015  . Encounter for antineoplastic chemotherapy 03/06/2015  . Hypokalemia 02/15/2015  . Protein-calorie malnutrition ([place]) 02/15/2015  . Neutropenic fever ([place]) 02/11/2015  . Neutropenia with fever ([place]) 02/11/2015  . Bicytopenia 02/11/2015  . Diarrhea 02/11/2015  . Malignant neoplasm of upper lobe of right lung ([place]) 01/05/2015  . 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 Bone metastases ([place]) 01/03/2015  . Physical exam 10/13/2014  . Osteopenia 10/07/2013  . Benign positional vertigo 05/08/2012  . TINNITUS, LEFT 01/09/2009  . CARDIAC MURMUR, AORTIC 01/05/2007  . MOTION SICKNESS 01/05/2007    Otelia Limes, PTA 11/28/2015, 11:14 AM  [place], [place], [address] Phone: [phone]   Fax:  [phone]  Name: VIDA NICOL MRN:  [id number] Date of Birth: [birth year]

## 2015-11-29 ENCOUNTER — Ambulatory Visit: Payer: BC Managed Care – PPO | Admitting: Radiation Oncology

## 2015-11-30 ENCOUNTER — Ambulatory Visit: Payer: BC Managed Care – PPO

## 2015-11-30 ENCOUNTER — Other Ambulatory Visit: Payer: Self-pay | Admitting: Internal Medicine

## 2015-11-30 DIAGNOSIS — M6281 Muscle weakness (generalized): Secondary | ICD-10-CM

## 2015-11-30 DIAGNOSIS — M25612 Stiffness of left shoulder, not elsewhere classified: Secondary | ICD-10-CM

## 2015-11-30 DIAGNOSIS — M25512 Pain in left shoulder: Secondary | ICD-10-CM

## 2015-11-30 DIAGNOSIS — M25511 Pain in right shoulder: Secondary | ICD-10-CM

## 2015-11-30 DIAGNOSIS — M25611 Stiffness of right shoulder, not elsewhere classified: Secondary | ICD-10-CM

## 2015-11-30 DIAGNOSIS — R293 Abnormal posture: Secondary | ICD-10-CM

## 2015-11-30 MED FILL — *GILOTRIF 30 MG TABLET: 30 | 30 days supply | Qty: 30 | Fill #0

## 2015-11-30 NOTE — Therapy (Signed)
[place], [place], [address] Phone: [phone]   Fax:  [phone]  Physical Therapy Treatment  Patient Details  Name: Laura Walter MRN: [id number] Date of Birth: [birth year] Referring Provider: Shona Simpson  Encounter Date: 11/30/2015      PT End of Session - 11/30/15 1206    Visit Number 4   Number of Visits 17   Date for PT Re-Evaluation 01/04/16   PT Start Time 1022   PT Stop Time 1104   PT Time Calculation (min) 42 min   Activity Tolerance Patient tolerated treatment well   Behavior During Therapy [facility] for tasks assessed/performed      Past Medical History  Diagnosis Date  . Pneumonia 2009 & 2011  . Allergy     seasonal  . H/O reactive hypoglycemia   . Osteopenia   . IBS (irritable bowel syndrome)   . Bronchitis   . S/P radiation therapy completed 02/08/15    whole brain ,spine  . Brain cancer ([place]) 01/13/15    MRI multiple small brain mets 10 individual lesions  . Bone cancer ([place]) 01/10/15 PET    lytic lesion lt 1st rib,T9,L3 Lt acetabulum  . Lung cancer ([place])     stageIV nscca rul  . Paronychia 11/17/2015    Past Surgical History  Procedure Laterality Date  . Colonoscopy  2009    Negative; [facility]  . Tonsillectomy and adenoidectomy    . Wisdom tooth extraction    . Esophagogastroduodenoscopy (egd) with propofol N/A 04/11/2015    Procedure: ESOPHAGOGASTRODUODENOSCOPY (EGD) WITH PROPOFOL;  Surgeon: Carol Ada, MD;  [facility];  Service: Endoscopy;  Laterality: N/A;  . Peg placement N/A 04/11/2015    Procedure: PERCUTANEOUS ENDOSCOPIC GASTROSTOMY (PEG) PLACEMENT;  Surgeon: Carol Ada, MD;  [facility];  Service: Endoscopy;  Laterality: N/A;    There were no vitals filed for this visit.      Subjective Assessment - 11/30/15 1032    Subjective Not having any pain today with arms at rest. Feel like my Rt shoulder ROM and pain are definitely getting  better, Lt shoulder also some, just not as remarkable as the Rt .     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 Pt now to undergo radiation to R lung.    Patient Stated Goals increase strength of bilateral LEs and UEs, stand from a chair easier   Currently in Pain? No/denies            [facility] PT Assessment - 11/30/15 0001    AROM   Right Shoulder Flexion 94 Degrees   Right Shoulder ABduction 84 Degrees   Left Shoulder Flexion 85 Degrees   Left Shoulder ABduction 69 Degrees                     [facility] Adult PT Treatment/Exercise - 11/30/15 0001    Elbow Exercises   Elbow Extension Strengthening;Both;5 reps  2 sets in supine with shoulder near 90 degrees    Bar Weights/Barbell (Elbow Extension) 1 lb  on each wrist   Elbow Extension Limitations Therapist held UE in postition, verbal cuing for full ROM  Shoulder Exercises: Supine   Other Supine Exercises Meeks Decompression Exercises all 5 reps for 5 seconds holds.    Shoulder Exercises: Seated   Retraction AROM;Both;5 reps  Seated EOB   Shoulder Exercises: Pulleys   Flexion 2 minutes   ABduction 2 minutes   ABduction Limitations Slight scaption   Shoulder Exercises: Therapy Ball   Flexion 5 reps   Shoulder Exercises: ROM/Strengthening   Other ROM/Strengthening Exercises Finger Ladder for bil UE abduction 5 reps each with tactile and verbal cuing to decrease scapular compensations.    Manual Therapy   Passive ROM In Supine to bil into flexion, abduction, external and internal rotation all gently and to pts tolerance.                    Short Term Clinic  Goals - 11/30/15 1041    CC Short Term Goal  #1   Title Pt to demonstrate 105 degrees of shoulder flexion bilaterally to allow pt to reach items on shelves.    Baseline R 76, L 85, Rt 94, Lt 85 11/30/15   Status On-going   CC Short Term Goal  #2   Title Pt to demonstrate 90 degrees of shoulder abduction bilaterally to allow pt to reach out to sides   Baseline R 70, L 60, Rt 84, Lt 69 11/30/15   Status On-going   CC Short Term Goal  #3   Title Pt to demonstrate 3+/5 tricep strength to allow pt to push to stand without pain   Status On-going   CC Short Term Goal  #4   Title Pt to demonstate 4/5 hip flexor strength to decrease fatigue during ambulation   Status On-going             [facility] Goals - 11/09/15 1618    CC Long Term Goal  #1   Title Pt to demonstate 160 degrees of bilateral shoulder flexion to allow pt to reach overhead   Baseline R 76, L 85   Time 8   Period Weeks   Status New   CC Long Term Goal  #2   Title Pt to demonstrate 160 degrees of bilateral shoulder abduction to allow pt to reach items out to sides   Baseline R 70, L 60   Time 8   Period Weeks   Status New   CC Long Term Goal  #3   Title Pt to demonstrate gross shoulder strength of 4/5 to allow pt to return to prior level of function   Baseline 2+/5   Time 8   Period Weeks   Status New   CC Long Term Goal  #4   Title Pt to be independent in a home exercise program for strengthening, ROM and posture for long term management   Baseline NA   Time 8   Period Weeks   Status New   CC Long Term Goal  #5   Title Pt to improve quick DASH score to less than or equal to 30% impairment   Baseline 50%   Time 8   Period Weeks   Status New            Plan - 11/30/15 1206    Clinical Impression Statement Pt did very well with new AAROM exercises today and her AROM had increased at all measurements taken except unchanged with Lt shoulder flexion. Pt is progressing well towards her goals and she  reports feeling better at home and being compliant with  HEP.    Rehab Potential Good   Clinical Impairments Affecting Rehab Potential pt to begin radiation to lung , pt had fall in driveway 01/11/62 requiring stitches at Rt eyebrow line   PT Frequency 2x / week   PT Duration 8 weeks   PT Treatment/Interventions Therapeutic exercise;Therapeutic activities;Taping;Electrical Stimulation;Passive range of motion;Patient/family education   PT Next Visit Plan Cont PROM to bilateral shoulders, cont AAROM and cont posture exercises, review HEP prn.    Consulted and Agree with Plan of Care Patient      Patient will benefit from skilled therapeutic intervention in order to improve the following deficits and impairments:  Postural dysfunction, Decreased range of motion, Impaired UE functional use, Pain, Decreased strength, Decreased activity tolerance, Decreased endurance  Visit Diagnosis: Muscle weakness (generalized)  Right shoulder pain  Left shoulder pain  Limitation of joint motion of right shoulder  Abnormal posture  Limitation of joint motion of left shoulder     Problem List Patient Active Problem List   Diagnosis Date Noted  . Paronychia 11/17/2015  . Paronychia of great toe, right 08/21/2015  . Paronychia of great toe, left 08/21/2015  . Nausea without vomiting 04/24/2015  . Feeding difficulties 04/11/2015  . Encounter for antineoplastic chemotherapy 03/06/2015  . Hypokalemia 02/15/2015  . Protein-calorie malnutrition ([place]) 02/15/2015  . Neutropenic fever ([place]) 02/11/2015  . Neutropenia with fever ([place]) 02/11/2015  . Bicytopenia 02/11/2015  . Diarrhea 02/11/2015  . Malignant neoplasm of upper lobe of right lung ([place]) 01/05/2015  . 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 Bone metastases ([place]) 01/03/2015  . Physical exam 10/13/2014  . Osteopenia 10/07/2013  . Benign positional vertigo 05/08/2012  . TINNITUS, LEFT 01/09/2009  . CARDIAC MURMUR, AORTIC 01/05/2007  . MOTION  SICKNESS 01/05/2007    Laura Walter, PTA 11/30/2015, 12:09 PM  [place], [place], [address] Phone: [phone]   Fax:  [phone]  Name: DEBBERA WOLKEN MRN: [id number] Date of Birth: 1951/02/23

## 2015-12-13 ENCOUNTER — Ambulatory Visit: Payer: BC Managed Care – PPO | Attending: Radiation Oncology

## 2015-12-13 DIAGNOSIS — M6281 Muscle weakness (generalized): Secondary | ICD-10-CM | POA: Diagnosis present

## 2015-12-13 DIAGNOSIS — M25511 Pain in right shoulder: Secondary | ICD-10-CM | POA: Diagnosis present

## 2015-12-13 DIAGNOSIS — M25611 Stiffness of right shoulder, not elsewhere classified: Secondary | ICD-10-CM | POA: Diagnosis present

## 2015-12-13 DIAGNOSIS — M25512 Pain in left shoulder: Secondary | ICD-10-CM | POA: Insufficient documentation

## 2015-12-13 DIAGNOSIS — R293 Abnormal posture: Secondary | ICD-10-CM | POA: Diagnosis present

## 2015-12-13 DIAGNOSIS — M25612 Stiffness of left shoulder, not elsewhere classified: Secondary | ICD-10-CM | POA: Insufficient documentation

## 2015-12-13 NOTE — Patient Instructions (Signed)
Over Head Pull: Narrow Grip     Cancer Rehab [phone]   On back, knees bent, feet flat, band across thighs, elbows straight but relaxed. Pull hands apart (start). Keeping elbows straight, bring arms up and over head, hands toward floor. Keep pull steady on band. Hold momentarily. Return slowly, keeping pull steady, back to start. Then do with wider grip. Repeat _5-8__ times, 2 sets. Band color _yellow_____   Side Pull: Double Arm   On back, knees bent, feet flat. Arms perpendicular to body, shoulder level, elbows straight but relaxed. Pull arms out to sides, elbows straight. Resistance band comes across collarbones, hands toward floor. Hold momentarily. Slowly return to starting position. Repeat _5-8__ times, 2 sets. Band color _yellow____   Sash   On back, knees bent, feet flat, left hand on left hip, right hand above left. Pull right arm DIAGONALLY (hip to shoulder) across chest. Bring right arm along head toward floor. Hold momentarily. Slowly return to starting position. Repeat _5-8__ times, 2 sets. Do with left arm. Band color _yellow_____   Shoulder Rotation: Double Arm   On back, knees bent, feet flat, elbows tucked at sides, bent 90, hands palms up. Pull hands apart and down toward floor, keeping elbows near sides. Hold momentarily. Slowly return to starting position. Repeat _5-8__ times, 2 sets. Band color _yellow_____

## 2015-12-13 NOTE — Therapy (Signed)
[place], [place], [address] Phone: [phone]   Fax:  [phone]  Physical Therapy Treatment  Patient Details  Name: Laura Walter MRN: [id number] Date of Birth: [birth year] Referring Provider: Shona Simpson  Encounter Date: 12/13/2015      PT End of Session - 12/13/15 1155    Visit Number 5   Number of Visits 17   Date for PT Re-Evaluation 01/04/16   PT Start Time 1109   PT Stop Time 1152   PT Time Calculation (min) 43 min   Activity Tolerance Patient tolerated treatment well;Patient limited by pain   Behavior During Therapy [facility] for tasks assessed/performed      Past Medical History  Diagnosis Date  . Pneumonia 2009 & 2011  . Allergy     seasonal  . H/O reactive hypoglycemia   . Osteopenia   . IBS (irritable bowel syndrome)   . Bronchitis   . S/P radiation therapy completed 02/08/15    whole brain ,spine  . Brain cancer ([place]) 01/13/15    MRI multiple small brain mets 10 individual lesions  . Bone cancer ([place]) 01/10/15 PET    lytic lesion lt 1st rib,T9,L3 Lt acetabulum  . Lung cancer ([place])     stageIV nscca rul  . Paronychia 11/17/2015    Past Surgical History  Procedure Laterality Date  . Colonoscopy  2009    Negative; [facility]  . Tonsillectomy and adenoidectomy    . Wisdom tooth extraction    . Esophagogastroduodenoscopy (egd) with propofol N/A 04/11/2015    Procedure: ESOPHAGOGASTRODUODENOSCOPY (EGD) WITH PROPOFOL;  Surgeon: Carol Ada, MD;  [facility];  Service: Endoscopy;  Laterality: N/A;  . Peg placement N/A 04/11/2015    Procedure: PERCUTANEOUS ENDOSCOPIC GASTROSTOMY (PEG) PLACEMENT;  Surgeon: Carol Ada, MD;  [facility];  Service: Endoscopy;  Laterality: N/A;    There were no vitals filed for this visit.      Subjective Assessment - 12/13/15 1120    Subjective Went to the beach so slacked off on my exercises some but overall I have been doing  them and my pain is better.    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 Pt now to undergo radiation to R lung.    Patient Stated Goals increase strength of bilateral LEs and UEs, stand from a chair easier   Currently in Pain? No/denies            [facility] PT Assessment - 12/13/15 0001    AROM   Right Shoulder Flexion 105 Degrees   Right Shoulder ABduction 80 Degrees   Left Shoulder Flexion 100 Degrees   Left Shoulder ABduction 72 Degrees                     [facility] Adult PT Treatment/Exercise - 12/13/15 0001    Shoulder Exercises: Supine   Horizontal ABduction Strengthening;Both;5 reps;Theraband   Theraband Level (Shoulder Horizontal ABduction) Level 1 (Yellow)   External Rotation Strengthening;Both;5 reps;Theraband   Theraband Level (Shoulder External Rotation) Level 1 (Yellow)   Flexion Strengthening;Both;5 reps;Theraband  Wide and narrow grip   Other Supine Exercises Bil D2 within pain tolerance  with yellow theraband 5 each side   Manual Therapy   Passive ROM In Supine to bil into flexion, abduction, external and internal rotation all gently and to pts tolerance.                 PT Education - 12/13/15 1134    Education provided Yes   Education Details Supine scapular series with yellow theraband   Person(s) Educated Patient   Methods Explanation;Demonstration;Handout   Comprehension Verbalized understanding;Returned demonstration;Need further instruction           Short Term Clinic Goals - 12/13/15 1208    CC Short Term Goal  #1   Title Pt to demonstrate 105 degrees of shoulder flexion bilaterally to  allow pt to reach items on shelves.    Baseline R 76, L 85, Rt 94, Lt 85 11/30/15, Rt 105, Lt 100 degree 12/13/15   Status Partially Met   CC Short Term Goal  #2   Title Pt to demonstrate 90 degrees of shoulder abduction bilaterally to allow pt to reach out to sides   Baseline R 70, L 60, Rt 84, Lt 69 11/30/15, Rt 80 Lt 72 degrees 12/13/15   Status On-going   CC Short Term Goal  #3   Title Pt to demonstrate 3+/5 tricep strength to allow pt to push to stand without pain   Status On-going   CC Short Term Goal  #4   Title Pt to demonstate 4/5 hip flexor strength to decrease fatigue during ambulation   Status On-going             Long Term Clinic Goals - 11/09/15 1618    CC Long Term Goal  #1   Title Pt to demonstate 160 degrees of bilateral shoulder flexion to allow pt to reach overhead   Baseline R 76, L 85   Time 8   Period Weeks   Status New   CC Long Term Goal  #2   Title Pt to demonstrate 160 degrees of bilateral shoulder abduction to allow pt to reach items out to sides   Baseline R 70, L 60   Time 8   Period Weeks   Status New   CC Long Term Goal  #3   Title Pt to demonstrate gross shoulder strength of 4/5 to allow pt to return to prior level of function   Baseline 2+/5   Time 8   Period Weeks   Status New   CC Long Term Goal  #4   Title Pt to be independent in a home exercise program for strengthening, ROM and posture for long term management   Baseline NA   Time 8   Period Weeks   Status New   CC Long Term Goal  #5   Title Pt to improve quick DASH score to less than or equal to 30% impairment   Baseline 50%   Time 8   Period Weeks   Status New            Plan - 12/13/15 1157    Clinical Impression Statement Progressed pts HEP today to include supine scapular series with yellow theraband. Only problem was some pain with end ROM Lt D2 which pt knows not to push into pain and even maybe do this one without the band. Her AROM flexion has improved with  measurements today and pt is pleased with her progress thus far reporting she feels like the P/ROM helps the most. Pt did become very fatigued with  gym exercises today so maybe not do the ball on wall anymore as the standing seemed to fatigue her the most.    Rehab Potential Good   Clinical Impairments Affecting Rehab Potential pt to begin radiation to lung , pt had fall in driveway 12/11/83 requiring stitches at Rt eyebrow line   PT Frequency 2x / week   PT Duration 8 weeks   PT Treatment/Interventions Therapeutic exercise;Therapeutic activities;Taping;Electrical Stimulation;Passive range of motion;Patient/family education   PT Next Visit Plan Cont PROM to bilateral shoulders, cont AAROM and cont posture exercises, review HEP prn.    Consulted and Agree with Plan of Care Patient      Patient will benefit from skilled therapeutic intervention in order to improve the following deficits and impairments:  Postural dysfunction, Decreased range of motion, Impaired UE functional use, Pain, Decreased strength, Decreased activity tolerance, Decreased endurance  Visit Diagnosis: Muscle weakness (generalized)  Right shoulder pain  Left shoulder pain  Abnormal posture     Problem List Patient Active Problem List   Diagnosis Date Noted  . Paronychia 11/17/2015  . Paronychia of great toe, right 08/21/2015  . Paronychia of great toe, left 08/21/2015  . Nausea without vomiting 04/24/2015  . Feeding difficulties 04/11/2015  . Encounter for antineoplastic chemotherapy 03/06/2015  . Hypokalemia 02/15/2015  . Protein-calorie malnutrition ([place]) 02/15/2015  . Neutropenic fever ([place]) 02/11/2015  . Neutropenia with fever ([place]) 02/11/2015  . Bicytopenia 02/11/2015  . Diarrhea 02/11/2015  . Malignant neoplasm of upper lobe of right lung ([place]) 01/05/2015  . 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 Bone metastases ([place]) 01/03/2015  . Physical exam 10/13/2014  . Osteopenia 10/07/2013  . Benign positional  vertigo 05/08/2012  . TINNITUS, LEFT 01/09/2009  . CARDIAC MURMUR, AORTIC 01/05/2007  . MOTION SICKNESS 01/05/2007    Otelia Limes, PTA 12/13/2015, 12:09 PM  [place], [place], [address] Phone: [phone]   Fax:  [phone]  Name: Laura Walter MRN: [id number] Date of Birth: 02/17/1951

## 2015-12-15 ENCOUNTER — Encounter: Payer: Self-pay | Admitting: Physical Therapy

## 2015-12-15 ENCOUNTER — Ambulatory Visit: Payer: BC Managed Care – PPO | Admitting: Physical Therapy

## 2015-12-15 DIAGNOSIS — M25511 Pain in right shoulder: Secondary | ICD-10-CM

## 2015-12-15 DIAGNOSIS — R293 Abnormal posture: Secondary | ICD-10-CM

## 2015-12-15 DIAGNOSIS — M6281 Muscle weakness (generalized): Secondary | ICD-10-CM | POA: Diagnosis not present

## 2015-12-15 DIAGNOSIS — M25611 Stiffness of right shoulder, not elsewhere classified: Secondary | ICD-10-CM

## 2015-12-15 DIAGNOSIS — M25512 Pain in left shoulder: Secondary | ICD-10-CM

## 2015-12-15 DIAGNOSIS — M25612 Stiffness of left shoulder, not elsewhere classified: Secondary | ICD-10-CM

## 2015-12-15 NOTE — Therapy (Signed)
[place], [place], [address] Phone: [phone]   Fax:  [phone]  Physical Therapy Treatment  Patient Details  Name: Laura Walter MRN: [id number] Date of Birth: 11/02/1950 Referring Provider: Shona Simpson  Encounter Date: 12/15/2015      PT End of Session - 12/15/15 0933    Visit Number 6   Number of Visits 17   Date for PT Re-Evaluation 01/04/16   PT Start Time 0903  pt arrived late to appt due to side effects of chemo   PT Stop Time 0930   PT Time Calculation (min) 27 min   Activity Tolerance Patient tolerated treatment well   Behavior During Therapy [facility] for tasks assessed/performed      Past Medical History  Diagnosis Date  . Pneumonia 2009 & 2011  . Allergy     seasonal  . H/O reactive hypoglycemia   . Osteopenia   . IBS (irritable bowel syndrome)   . Bronchitis   . S/P radiation therapy completed 02/08/15    whole brain ,spine  . Brain cancer ([place]) 01/13/15    MRI multiple small brain mets 10 individual lesions  . Bone cancer ([place]) 01/10/15 PET    lytic lesion lt 1st rib,T9,L3 Lt acetabulum  . Lung cancer ([place])     stageIV nscca rul  . Paronychia 11/17/2015    Past Surgical History  Procedure Laterality Date  . Colonoscopy  2009    Negative; [facility]  . Tonsillectomy and adenoidectomy    . Wisdom tooth extraction    . Esophagogastroduodenoscopy (egd) with propofol N/A 04/11/2015    Procedure: ESOPHAGOGASTRODUODENOSCOPY (EGD) WITH PROPOFOL;  Surgeon: Carol Ada, MD;  [facility];  Service: Endoscopy;  Laterality: N/A;  . Peg placement N/A 04/11/2015    Procedure: PERCUTANEOUS ENDOSCOPIC GASTROSTOMY (PEG) PLACEMENT;  Surgeon: Carol Ada, MD;  [facility];  Service: Endoscopy;  Laterality: N/A;    There were no vitals filed for this visit.      Subjective Assessment - 12/15/15 0904    Subjective I have been doing my exercises. I have not had any  muscle soreness.    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 Pt now to undergo radiation to R lung.    Patient Stated Goals increase strength of bilateral LEs and UEs, stand from a chair easier   Currently in Pain? No/denies   Pain Score 0-No pain                         [facility] Adult PT Treatment/Exercise - 12/15/15 0001    Shoulder Exercises: Supine   Horizontal ABduction Strengthening;Both;Theraband;10 reps   Theraband Level (Shoulder Horizontal ABduction) Level 1 (Yellow)   External Rotation Strengthening;Both;Theraband;10 reps   Theraband Level (Shoulder External Rotation) Level 1 (Yellow)   Flexion Strengthening;Both;Theraband;10 reps  Wide and narrow grip   Other Supine Exercises Bil D2 within pain tolerance with yellow theraband 10 each side   Manual Therapy   Passive ROM In Supine to bil into flexion, abduction, external and internal rotation all gently and to pts tolerance.  Short Term Clinic Goals - 12/13/15 1208    CC Short Term Goal  #1   Title Pt to demonstrate 105 degrees of shoulder flexion bilaterally to allow pt to reach items on shelves.    Baseline R 76, L 85, Rt 94, Lt 85 11/30/15, Rt 105, Lt 100 degree 12/13/15   Status Partially Met   CC Short Term Goal  #2   Title Pt to demonstrate 90 degrees of shoulder abduction bilaterally to allow pt to reach out to sides   Baseline R 70, L 60, Rt 84, Lt 69 11/30/15, Rt 80 Lt 72 degrees 12/13/15   Status On-going   CC Short Term Goal  #3   Title Pt to demonstrate 3+/5 tricep strength to allow pt to push to stand without pain   Status  On-going   CC Short Term Goal  #4   Title Pt to demonstate 4/5 hip flexor strength to decrease fatigue during ambulation   Status On-going             Long Term Clinic Goals - 11/09/15 1618    CC Long Term Goal  #1   Title Pt to demonstate 160 degrees of bilateral shoulder flexion to allow pt to reach overhead   Baseline R 76, L 85   Time 8   Period Weeks   Status New   CC Long Term Goal  #2   Title Pt to demonstrate 160 degrees of bilateral shoulder abduction to allow pt to reach items out to sides   Baseline R 70, L 60   Time 8   Period Weeks   Status New   CC Long Term Goal  #3   Title Pt to demonstrate gross shoulder strength of 4/5 to allow pt to return to prior level of function   Baseline 2+/5   Time 8   Period Weeks   Status New   CC Long Term Goal  #4   Title Pt to be independent in a home exercise program for strengthening, ROM and posture for long term management   Baseline NA   Time 8   Period Weeks   Status New   CC Long Term Goal  #5   Title Pt to improve quick DASH score to less than or equal to 30% impairment   Baseline 50%   Time 8   Period Weeks   Status New            Plan - 12/15/15 0931    Clinical Impression Statement Pt completed supine scapular series with yellow theraband today. She had pain in LUE and her ROM was very limited with all motions during this exercise series. PROM performed and ROM was slowly increased. Gentle rhythmic motions towards end range helped relax motion and allow further range.    Rehab Potential Good   Clinical Impairments Affecting Rehab Potential pt to begin radiation to lung , pt had fall in driveway 02/05/40 requiring stitches at Rt eyebrow line   PT Frequency 2x / week   PT Duration 8 weeks   PT Treatment/Interventions Therapeutic exercise;Therapeutic activities;Taping;Electrical Stimulation;Passive range of motion;Patient/family education   PT Next Visit Plan Cont PROM to bilateral shoulders, cont AAROM and  cont posture exercises, review HEP prn.    Consulted and Agree with Plan of Care Patient      Patient will benefit from skilled therapeutic intervention in order to improve the following deficits and impairments:  Postural dysfunction, Decreased range of motion, Impaired UE functional  use, Pain, Decreased strength, Decreased activity tolerance, Decreased endurance  Visit Diagnosis: Muscle weakness (generalized)  Left shoulder pain  Abnormal posture  Limitation of joint motion of right shoulder  Limitation of joint motion of left shoulder  Right shoulder pain     Problem List Patient Active Problem List   Diagnosis Date Noted  . Paronychia 11/17/2015  . Paronychia of great toe, right 08/21/2015  . Paronychia of great toe, left 08/21/2015  . Nausea without vomiting 04/24/2015  . Feeding difficulties 04/11/2015  . Encounter for antineoplastic chemotherapy 03/06/2015  . Hypokalemia 02/15/2015  . Protein-calorie malnutrition ([place]) 02/15/2015  . Neutropenic fever ([place]) 02/11/2015  . Neutropenia with fever ([place]) 02/11/2015  . Bicytopenia 02/11/2015  . Diarrhea 02/11/2015  . Malignant neoplasm of upper lobe of right lung ([place]) 01/05/2015  . 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 Bone metastases ([place]) 01/03/2015  . Physical exam 10/13/2014  . Osteopenia 10/07/2013  . Benign positional vertigo 05/08/2012  . TINNITUS, LEFT 01/09/2009  . CARDIAC MURMUR, AORTIC 01/05/2007  . MOTION SICKNESS 01/05/2007    Alexia Freestone 12/15/2015, 9:34 AM  [place], [place], [address] Phone: [phone]   Fax:  [phone]  Name: Laura Walter MRN: [id number] Date of Birth: July 13, 1951    Allyson Sabal, PT 12/15/2015 9:35 AM

## 2015-12-18 ENCOUNTER — Ambulatory Visit: Payer: BC Managed Care – PPO | Admitting: Physical Therapy

## 2015-12-18 DIAGNOSIS — M25512 Pain in left shoulder: Secondary | ICD-10-CM

## 2015-12-18 DIAGNOSIS — M6281 Muscle weakness (generalized): Secondary | ICD-10-CM

## 2015-12-18 DIAGNOSIS — M25612 Stiffness of left shoulder, not elsewhere classified: Secondary | ICD-10-CM

## 2015-12-18 DIAGNOSIS — M25611 Stiffness of right shoulder, not elsewhere classified: Secondary | ICD-10-CM

## 2015-12-18 DIAGNOSIS — M25511 Pain in right shoulder: Secondary | ICD-10-CM

## 2015-12-18 NOTE — Therapy (Signed)
[place], [place], [address] Phone: [phone]   Fax:  [phone]  Physical Therapy Treatment  Patient Details  Name: Laura Walter MRN: [id number] Date of Birth: January 12, 1951 Referring Provider: Shona Simpson  Encounter Date: 12/18/2015      PT End of Session - 12/18/15 1315    Visit Number 7   Number of Visits 17   Date for PT Re-Evaluation 01/04/16   PT Start Time 0935   PT Stop Time 1016   PT Time Calculation (min) 41 min   Activity Tolerance Patient tolerated treatment well   Behavior During Therapy [facility] for tasks assessed/performed      Past Medical History  Diagnosis Date  . Pneumonia 2009 & 2011  . Allergy     seasonal  . H/O reactive hypoglycemia   . Osteopenia   . IBS (irritable bowel syndrome)   . Bronchitis   . S/P radiation therapy completed 02/08/15    whole brain ,spine  . Brain cancer ([place]) 01/13/15    MRI multiple small brain mets 10 individual lesions  . Bone cancer ([place]) 01/10/15 PET    lytic lesion lt 1st rib,T9,L3 Lt acetabulum  . Lung cancer ([place])     stageIV nscca rul  . Paronychia 11/17/2015    Past Surgical History  Procedure Laterality Date  . Colonoscopy  2009    Negative; [facility]  . Tonsillectomy and adenoidectomy    . Wisdom tooth extraction    . Esophagogastroduodenoscopy (egd) with propofol N/A 04/11/2015    Procedure: ESOPHAGOGASTRODUODENOSCOPY (EGD) WITH PROPOFOL;  Surgeon: Carol Ada, MD;  [facility];  Service: Endoscopy;  Laterality: N/A;  . Peg placement N/A 04/11/2015    Procedure: PERCUTANEOUS ENDOSCOPIC GASTROSTOMY (PEG) PLACEMENT;  Surgeon: Carol Ada, MD;  [facility];  Service: Endoscopy;  Laterality: N/A;    There were no vitals filed for this visit.      Subjective Assessment - 12/18/15 0937    Subjective "My arms are better.  My range of motion is better, so we're helping that."  It doesn't hurt like it did when I  reach around for something.  Feels confident about the exercises she was given last time.     Pertinent History --   Currently in Pain? No/denies                         [facility] Adult PT Treatment/Exercise - 12/18/15 0001    Lumbar Exercises: Supine   Other Supine Lumbar Exercises hooklying with arms outstretched, lower trunk rotation each way, held 10 counts, x 3   Shoulder Exercises: Seated   Other Seated Exercises backward shoulder rolls x7 at end of session   Manual Therapy   Manual Therapy Myofascial release;Scapular mobilization   Myofascial Release Gentle UE myofascial pulling through partial abduction ROM, right and left in supine   Scapular Mobilization In sidelying on each side, mobilization into protraction and depression, rhythmic   Passive ROM In supine to right and left shoulders into ER, abduction, and flexion.   Prosthetics   Current prosthetic weight-bearing tolerance (hours/day)                      Short Term Clinic Goals - 12/13/15 1208    CC Short Term Goal  #1   Title Pt to demonstrate 105 degrees of shoulder flexion bilaterally to allow pt to reach items on shelves.  Baseline R 76, L 85, Rt 94, Lt 85 11/30/15, Rt 105, Lt 100 degree 12/13/15   Status Partially Met   CC Short Term Goal  #2   Title Pt to demonstrate 90 degrees of shoulder abduction bilaterally to allow pt to reach out to sides   Baseline R 70, L 60, Rt 84, Lt 69 11/30/15, Rt 80 Lt 72 degrees 12/13/15   Status On-going   CC Short Term Goal  #3   Title Pt to demonstrate 3+/5 tricep strength to allow pt to push to stand without pain   Status On-going   CC Short Term Goal  #4   Title Pt to demonstate 4/5 hip flexor strength to decrease fatigue during ambulation   Status On-going             Long Term Clinic Goals - 11/09/15 1618    CC Long Term Goal  #1   Title Pt to demonstate 160 degrees of bilateral shoulder flexion to allow pt to reach overhead   Baseline R 76,  L 85   Time 8   Period Weeks   Status New   CC Long Term Goal  #2   Title Pt to demonstrate 160 degrees of bilateral shoulder abduction to allow pt to reach items out to sides   Baseline R 70, L 60   Time 8   Period Weeks   Status New   CC Long Term Goal  #3   Title Pt to demonstrate gross shoulder strength of 4/5 to allow pt to return to prior level of function   Baseline 2+/5   Time 8   Period Weeks   Status New   CC Long Term Goal  #4   Title Pt to be independent in a home exercise program for strengthening, ROM and posture for long term management   Baseline NA   Time 8   Period Weeks   Status New   CC Long Term Goal  #5   Title Pt to improve quick DASH score to less than or equal to 30% impairment   Baseline 50%   Time 8   Period Weeks   Status New            Plan - 12/18/15 1316    Clinical Impression Statement Pt. did not have questions about HEP given last time.  She did well with PROM and other manual techniques today, though with moderate tolerance for stretching.  She felt it was a beneficial session.   Rehab Potential Good   Clinical Impairments Affecting Rehab Potential pt began radiation to lung , pt had fall in driveway 08/08/76 requiring stitches at Rt eyebrow line   PT Frequency 2x / week   PT Duration 8 weeks   PT Treatment/Interventions Therapeutic exercise;Manual techniques;Passive range of motion   PT Next Visit Plan Remeasure ROM.  Cont PROM to bilateral shoulders, cont AAROM and cont posture exercises, review HEP prn.    Consulted and Agree with Plan of Care Patient      Patient will benefit from skilled therapeutic intervention in order to improve the following deficits and impairments:  Postural dysfunction, Decreased range of motion, Impaired UE functional use, Pain, Decreased strength, Decreased activity tolerance, Decreased endurance  Visit Diagnosis: Muscle weakness (generalized)  Left shoulder pain  Limitation of joint motion of right  shoulder  Limitation of joint motion of left shoulder  Right shoulder pain     Problem List Patient Active Problem List   Diagnosis  Date Noted  . Paronychia 11/17/2015  . Paronychia of great toe, right 08/21/2015  . Paronychia of great toe, left 08/21/2015  . Nausea without vomiting 04/24/2015  . Feeding difficulties 04/11/2015  . Encounter for antineoplastic chemotherapy 03/06/2015  . Hypokalemia 02/15/2015  . Protein-calorie malnutrition ([place]) 02/15/2015  . Neutropenic fever ([place]) 02/11/2015  . Neutropenia with fever ([place]) 02/11/2015  . Bicytopenia 02/11/2015  . Diarrhea 02/11/2015  . Malignant neoplasm of upper lobe of right lung ([place]) 01/05/2015  . 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 Bone metastases ([place]) 01/03/2015  . Physical exam 10/13/2014  . Osteopenia 10/07/2013  . Benign positional vertigo 05/08/2012  . TINNITUS, LEFT 01/09/2009  . CARDIAC MURMUR, AORTIC 01/05/2007  . MOTION SICKNESS 01/05/2007    [place] 12/18/2015, 1:19 PM  [place], [place], [address] Phone: [phone]   Fax:  [phone]  Name: EMINA RIBAUDO MRN: [id number] Date of Birth: [birth year]    Valinda Fedie, PT 12/18/2015 1:19 PM

## 2015-12-19 ENCOUNTER — Other Ambulatory Visit (HOSPITAL_BASED_OUTPATIENT_CLINIC_OR_DEPARTMENT_OTHER): Payer: BC Managed Care – PPO

## 2015-12-19 ENCOUNTER — Telehealth: Payer: Self-pay | Admitting: Internal Medicine

## 2015-12-19 ENCOUNTER — Telehealth: Payer: Self-pay | Admitting: *Deleted

## 2015-12-19 ENCOUNTER — Ambulatory Visit (HOSPITAL_BASED_OUTPATIENT_CLINIC_OR_DEPARTMENT_OTHER): Payer: BC Managed Care – PPO | Admitting: Internal Medicine

## 2015-12-19 ENCOUNTER — Encounter: Payer: Self-pay | Admitting: Internal Medicine

## 2015-12-19 VITALS — BP 122/70 | HR 79 | Temp 97.5°F | Resp 17 | Ht 67.0 in | Wt 132.2 lb

## 2015-12-19 DIAGNOSIS — C7951 Secondary malignant neoplasm of bone: Secondary | ICD-10-CM

## 2015-12-19 DIAGNOSIS — R110 Nausea: Secondary | ICD-10-CM

## 2015-12-19 DIAGNOSIS — E46 Unspecified protein-calorie malnutrition: Secondary | ICD-10-CM

## 2015-12-19 DIAGNOSIS — C3411 Malignant neoplasm of upper lobe, right bronchus or lung: Secondary | ICD-10-CM

## 2015-12-19 DIAGNOSIS — C7931 Secondary malignant neoplasm of brain: Secondary | ICD-10-CM

## 2015-12-19 DIAGNOSIS — C778 Secondary and unspecified malignant neoplasm of lymph nodes of multiple regions: Secondary | ICD-10-CM | POA: Diagnosis not present

## 2015-12-19 DIAGNOSIS — Z5111 Encounter for antineoplastic chemotherapy: Secondary | ICD-10-CM

## 2015-12-19 DIAGNOSIS — C3491 Malignant neoplasm of unspecified part of right bronchus or lung: Secondary | ICD-10-CM

## 2015-12-19 DIAGNOSIS — G893 Neoplasm related pain (acute) (chronic): Secondary | ICD-10-CM

## 2015-12-19 DIAGNOSIS — L03039 Cellulitis of unspecified toe: Secondary | ICD-10-CM

## 2015-12-19 LAB — COMPREHENSIVE METABOLIC PANEL
ALT: 13 U/L (ref 0–55)
AST: 14 U/L (ref 5–34)
Albumin: 3.2 g/dL — ABNORMAL LOW (ref 3.5–5.0)
Alkaline Phosphatase: 54 U/L (ref 40–150)
Anion Gap: 5 mEq/L (ref 3–11)
BUN: 21.4 mg/dL (ref 7.0–26.0)
CHLORIDE: 111 meq/L — AB (ref 98–109)
CO2: 27 mEq/L (ref 22–29)
Calcium: 9 mg/dL (ref 8.4–10.4)
Creatinine: 0.8 mg/dL (ref 0.6–1.1)
EGFR: 75 mL/min/{1.73_m2} — ABNORMAL LOW (ref >90)
GLUCOSE: 74 mg/dL (ref 70–140)
POTASSIUM: 3.8 meq/L (ref 3.5–5.1)
SODIUM: 142 meq/L (ref 136–145)
Total Bilirubin: <0.3 mg/dL (ref 0.20–1.20)
Total Protein: 6.7 g/dL (ref 6.4–8.3)

## 2015-12-19 LAB — CBC WITH DIFFERENTIAL/PLATELET
BASO%: 0.5 % (ref 0.0–2.0)
BASOS ABS: 0 10*3/uL (ref 0.0–0.1)
EOS%: 4.6 % (ref 0.0–7.0)
Eosinophils Absolute: 0.2 10*3/uL (ref 0.0–0.5)
HCT: 31.9 % — ABNORMAL LOW (ref 34.8–46.6)
HGB: 10.3 g/dL — ABNORMAL LOW (ref 11.6–15.9)
LYMPH%: 7.8 % — ABNORMAL LOW (ref 14.0–49.7)
MCH: 30 pg (ref 25.1–34.0)
MCHC: 32.3 g/dL (ref 31.5–36.0)
MCV: 92.6 fL (ref 79.5–101.0)
MONO#: 0.4 10*3/uL (ref 0.1–0.9)
MONO%: 10.7 % (ref 0.0–14.0)
NEUT#: 2.7 10*3/uL (ref 1.5–6.5)
NEUT%: 76.4 % (ref 38.4–76.8)
Platelets: 164 10*3/uL (ref 145–400)
RBC: 3.45 10*6/uL — ABNORMAL LOW (ref 3.70–5.45)
RDW: 16 % — ABNORMAL HIGH (ref 11.2–14.5)
WBC: 3.6 10*3/uL — ABNORMAL LOW (ref 3.9–10.3)
lymph#: 0.3 10*3/uL — ABNORMAL LOW (ref 0.9–3.3)

## 2015-12-19 NOTE — Progress Notes (Signed)
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 Lisbeth Renshaw. 2) Gilotrif 40 mg by mouth daily. Status post 5 months of treatment. 3) stereotactic radiotherapy to the enlarging right upper lobe lung mass under the care of Dr. Lisbeth Renshaw  CURRENT THERAPY:  1) Gilotrif 30 mg by mouth daily. First dose started 08/31/2015. Status post 6 weeks of treatment. 2) Xgeva 120 mg subcutaneously every 4 weeks. First dose 05/01/2015.  INTERVAL HISTORY: Laura Walter 65 y.o. female returns to the clinic today for follow-up visit accompanied by her partner. The patient is feeling fine today was no specific complaints. She continues to have mild fatigue. She denied having any significant chest pain, shortness of breath, cough or hemoptysis. She has no nausea or vomiting. She denied having any significant weight loss or night sweats. She has no fever or chills. She tolerated the previous course of palliative radiotherapy to the right upper lobe lung mass fairly well.  MEDICAL HISTORY: Past Medical History  Diagnosis Date  . Pneumonia 2009 & 2011  . Allergy     seasonal  . H/O reactive hypoglycemia   . Osteopenia   . IBS (irritable bowel syndrome)   . Bronchitis   . S/P radiation therapy completed 02/08/15    whole brain ,spine  . Brain cancer ([place]) 01/13/15    MRI multiple small brain mets 10 individual lesions  . Bone cancer ([place]) 01/10/15 PET    lytic lesion lt 1st rib,T9,L3 Lt acetabulum  . Lung cancer ([place])    stageIV nscca rul  . Paronychia 11/17/2015    ALLERGIES:  is allergic to sulfonamide derivatives.  MEDICATIONS:  Current Outpatient Prescriptions  Medication Sig Dispense Refill  . GILOTRIF 30 MG tablet TAKE 1 TABLET BY MOUTH DAILY. TAKE ON AN EMPTY STOMACH 1 HOUR BEFORE OR 2 HOURS AFTER MEALS. 30 tablet 2  . loperamide (IMODIUM) 2 MG capsule Take 2 mg by mouth as needed for diarrhea or loose stools (Up to 8 times daily, if needed.). Reported on 12/19/2015    . Nutritional Supplements (FEEDING SUPPLEMENT, OSMOLITE 1.5 CAL,) LIQD Place 237 mLs into feeding tube 4 (four) times daily. (Patient taking differently: Place 237 mLs into feeding tube 2 (two) times daily. ) 958 mL 12  . OLANZapine (ZYPREXA) 10 MG tablet Take 10 mg by mouth at bedtime. Reported on 11/24/2015    . hyaluronate sodium (RADIAPLEXRX) GEL Apply 1 application topically 2 (two) times daily. Reported on 12/19/2015    . ibuprofen (ADVIL,MOTRIN) 200 MG tablet Take 200 mg by mouth every 6 (six) hours as needed for moderate pain. Reported on 12/19/2015     No current facility-administered medications for this visit.    SURGICAL HISTORY:  Past Surgical History  Procedure Laterality Date  . Colonoscopy  2009    Negative; [facility]  . Tonsillectomy and adenoidectomy    . Wisdom tooth extraction    . Esophagogastroduodenoscopy (egd) with propofol N/A 04/11/2015    Procedure: ESOPHAGOGASTRODUODENOSCOPY (EGD) WITH PROPOFOL;  Surgeon: Carol Ada, MD;  Location: [facility];  Service: Endoscopy;  Laterality: N/A;  . Peg placement N/A 04/11/2015    Procedure: PERCUTANEOUS ENDOSCOPIC GASTROSTOMY (PEG) PLACEMENT;  Surgeon: Carol Ada, MD;  Location: [facility];  Service: Endoscopy;  Laterality: N/A;    REVIEW OF SYSTEMS:  A comprehensive review of systems was negative except for: Constitutional: positive for fatigue   PHYSICAL EXAMINATION: General appearance: alert, cooperative, fatigued and no distress Head: Normocephalic, without  obvious abnormality, Several areas of bruises and ecchymosis on the right side of the face Neck: no adenopathy, no JVD, supple, symmetrical, trachea midline and thyroid not enlarged, symmetric, no tenderness/mass/nodules Lymph nodes: Cervical, supraclavicular, and axillary nodes normal. Resp: clear to auscultation bilaterally Back: Tenderness to palpation at the lower thoracic and lumbar vertebrae. Cardio: regular rate and rhythm, S1, S2 normal, no murmur, click, rub or gallop GI: soft, non-tender; bowel sounds normal; no masses,  no organomegaly Extremities: extremities normal, atraumatic, no cyanosis or edema Neurologic: Alert and oriented X 3, normal strength and tone. Normal symmetric reflexes. Normal coordination and gait    ECOG PERFORMANCE STATUS: 1 - Symptomatic but completely ambulatory  Blood pressure 122/70, pulse 79, temperature 97.5 F (36.4 C), temperature source Oral, resp. rate 17, height _0  (1.702 m), weight 132 lb 3.2 oz (59.966 kg), SpO2 97 %.  LABORATORY DATA: Lab Results  Component Value Date   WBC 3.6* 12/19/2015   HGB 10.3* 12/19/2015   HCT 31.9* 12/19/2015   MCV 92.6 12/19/2015   PLT 164 12/19/2015      Chemistry      Component Value Date/Time   NA 142 11/17/2015 0859   NA 137 04/12/2015 0835   K 4.0 11/17/2015 0859   K 3.7 04/12/2015 0835   CL 108 04/12/2015 0835   CO2 22 11/17/2015 0859   CO2 22 04/12/2015 0835   BUN 30.3* 11/17/2015 0859   BUN 17 04/12/2015 0835   CREATININE 0.9 11/17/2015 0859   CREATININE 0.81 04/12/2015 0835   CREATININE 0.83 11/02/2013 0832      Component Value Date/Time   CALCIUM 9.0 11/17/2015 0859   CALCIUM 8.4* 04/12/2015 0835   ALKPHOS 61 11/17/2015 0859   ALKPHOS 56 02/11/2015 0308   AST 20 11/17/2015 0859   AST 22 02/11/2015 0308   ALT 22 11/17/2015 0859   ALT 21 02/11/2015 0308   BILITOT <0.30 11/17/2015 0859   BILITOT 1.3* 02/11/2015 0308       RADIOGRAPHIC STUDIES: No results found.  ASSESSMENT  AND PLAN: This is a very pleasant 65 years old never smoker white female recently diagnosed with stage IV non-small cell lung cancer, adenocarcinoma with positive EGFR mutation with deletion in exon 19 presented with large right upper lobe lung mass in addition to bilateral mediastinal, supraclavicular lymphadenopathy as well as bone and multiple brain metastasis diagnosed in June 2016.  The patient completed whole brain irradiation under the care of Dr. Lisbeth Renshaw. She was started on treatment with Gilotrif 40 mg by mouth daily status post 5 months but this was discontinued secondary to intolerance with significant bony contact, skin rash in the sacral area as well as few episodes of diarrhea  The patient was started on Gilotrif 30 mg by mouth daily since 08/31/2015. She is tolerating it much better with the reduced dose was less adverse effects. Unfortunately the recent CT scan of the chest, abdomen and pelvis showed interval increase in the size of the right upper lobe lung mass compatible with disease progression. She is currently undergoing stereotactic  radiotherapy to the enlarging right upper lobe lung mass. Molecular studies from blood test as well as CT-guided core biopsy of the enlarging right upper lobe lung mass showed no evidence for T790M resistant mutation. She is tolerating her treatment with Gilotrif fairly well except for mild diarrhea. I recommended for the patient to continue Gilotrif with the same dose for now. I will see her back for follow-up visit in one month's for reevaluation with repeat CT scan of the chest, abdomen and pelvis for restaging of her disease. For the metastatic bone disease, I continue the patient on Xgeva 120 mg subcutaneously on monthly basis. The patient was also advised to increased her intake of calcium supplement and vitamin D. For the nausea, she will continue on Zyprexa 10 mg by mouth daily at bedtime.  For the paronychia, I recommended for the patient to see  a podiatrist for evaluation and management of her condition. She was advised to call immediately if she has any concerning symptoms in the interval. The patient voices understanding of current disease status and treatment options and is in agreement with the current care plan.  All questions were answered. The patient knows to call the clinic with any problems, questions or concerns. We can certainly see the patient much sooner if necessary.  Disclaimer: This note was dictated with voice recognition software. Similar sounding words can inadvertently be transcribed and may not be corrected upon review.  Paronychia

## 2015-12-19 NOTE — Telephone Encounter (Signed)
per pof to sch p appt-sent MW emailt o sch phlebotmy-pt tog et updated copy b4 leaving

## 2015-12-19 NOTE — Telephone Encounter (Signed)
Oncology Nurse Navigator Documentation  Oncology Nurse Navigator Flowsheets 12/19/2015  Navigator Location [facility]  Navigator Encounter Type Telephone  Telephone Outgoing Call  Treatment Phase Treatment  Interventions Other  Acuity Level 2  Time Spent with Patient 45   Patient and partner gave Dr. Julien Nordmann forms to be fill out for tip cancellation.  I completed forms and they were approved and signed by Dr. Julien Nordmann.  I called and left Charmelle a vm message on what she would like me to do next with forms.  I also left my name and phone number to call.

## 2015-12-20 ENCOUNTER — Encounter: Payer: Self-pay | Admitting: Physical Therapy

## 2015-12-20 ENCOUNTER — Ambulatory Visit: Payer: BC Managed Care – PPO | Admitting: Physical Therapy

## 2015-12-20 DIAGNOSIS — M25512 Pain in left shoulder: Secondary | ICD-10-CM

## 2015-12-20 DIAGNOSIS — M25611 Stiffness of right shoulder, not elsewhere classified: Secondary | ICD-10-CM

## 2015-12-20 DIAGNOSIS — M6281 Muscle weakness (generalized): Secondary | ICD-10-CM | POA: Diagnosis not present

## 2015-12-20 DIAGNOSIS — M25612 Stiffness of left shoulder, not elsewhere classified: Secondary | ICD-10-CM

## 2015-12-20 DIAGNOSIS — R293 Abnormal posture: Secondary | ICD-10-CM

## 2015-12-20 DIAGNOSIS — M25511 Pain in right shoulder: Secondary | ICD-10-CM

## 2015-12-20 NOTE — Therapy (Signed)
[place], [place], [address] Phone: [phone]   Fax:  [phone]  Physical Therapy Treatment  Patient Details  Name: Laura Walter MRN: [id number] Date of Birth: April 27, 1951 Referring Provider: Shona Simpson  Encounter Date: 12/20/2015      PT End of Session - 12/20/15 1209    Visit Number 8   Number of Visits 17   Date for PT Re-Evaluation 01/04/16   PT Start Time 1108   PT Stop Time 1147   PT Time Calculation (min) 39 min   Activity Tolerance Patient tolerated treatment well   Behavior During Therapy [facility] for tasks assessed/performed      Past Medical History  Diagnosis Date  . Pneumonia 2009 & 2011  . Allergy     seasonal  . H/O reactive hypoglycemia   . Osteopenia   . IBS (irritable bowel syndrome)   . Bronchitis   . S/P radiation therapy completed 02/08/15    whole brain ,spine  . Brain cancer ([place]) 01/13/15    MRI multiple small brain mets 10 individual lesions  . Bone cancer ([place]) 01/10/15 PET    lytic lesion lt 1st rib,T9,L3 Lt acetabulum  . Lung cancer ([place])     stageIV nscca rul  . Paronychia 11/17/2015    Past Surgical History  Procedure Laterality Date  . Colonoscopy  2009    Negative; [facility]  . Tonsillectomy and adenoidectomy    . Wisdom tooth extraction    . Esophagogastroduodenoscopy (egd) with propofol N/A 04/11/2015    Procedure: ESOPHAGOGASTRODUODENOSCOPY (EGD) WITH PROPOFOL;  Surgeon: Carol Ada, MD;  [facility];  Service: Endoscopy;  Laterality: N/A;  . Peg placement N/A 04/11/2015    Procedure: PERCUTANEOUS ENDOSCOPIC GASTROSTOMY (PEG) PLACEMENT;  Surgeon: Carol Ada, MD;  [facility];  Service: Endoscopy;  Laterality: N/A;    There were no vitals filed for this visit.      Subjective Assessment - 12/20/15 1109    Subjective My arms are getting better.   Pertinent History This is a very pleasant 65 years old never smoker white female  recently diagnosed with stage IV non-small cell lung cancer, adenocarcinoma with positive EGFR mutation with deletion in exon 19 presented with large right upper lobe lung mass in addition to bilateral mediastinal, supraclavicular lymphadenopathy as well as bone and multiple brain metastasis diagnosed in June 2016. Pt now to undergo radiation to R lung.    Patient Stated Goals increase strength of bilateral LEs and UEs, stand from a chair easier   Currently in Pain? No/denies   Pain Score 0-No pain                         [facility] Adult PT Treatment/Exercise - 12/20/15 0001    Manual Therapy   Manual Therapy Myofascial release;Scapular mobilization   Myofascial Release Gentle UE myofascial pulling through partial abduction ROM, right and left in supine   Scapular Mobilization In sidelying on each side, mobilization into protraction and depression, rhythmic   Passive ROM In supine to right and left shoulders into ER, abduction, and flexion.                PT Education - 12/20/15 1209    Education provided Yes   Education Details wall washing in direction of flexion and abduction x 3-5 repetitions dailys   Person(s) Educated Patient   Methods Explanation;Demonstration   Comprehension Returned demonstration;Verbalized understanding  Short Term Clinic Goals - 12/20/15 1140    CC Short Term Goal  #1   Title Pt to demonstrate 105 degrees of shoulder flexion bilaterally to allow pt to reach items on shelves.    Baseline R 76, L 85, Rt 94, Lt 85 11/30/15, Rt 105, Lt 100 degree 12/13/15, 12/20/15- L- 98, R - 109, pt states there is less pain while lifting L   Time 4   Period Weeks   Status Partially Met   CC Short Term Goal  #2   Title Pt to demonstrate 90 degrees of shoulder abduction bilaterally to allow pt to reach out to sides   Baseline R 70, L 60, Rt 84, Lt 69 11/30/15, Rt 80 Lt 72 degrees 12/13/15, 12/20/15- L - 82, R - 95 degrees   Time 4   Period Weeks    Status Partially Met   CC Short Term Goal  #3   Title Pt to demonstrate 3+/5 tricep strength to allow pt to push to stand without pain   Baseline 3/5, 12/20/15- R 3+/5, L 3/5   Time 4   Period Weeks   Status Partially Met   CC Short Term Goal  #4   Title Pt to demonstate 4/5 hip flexor strength to decrease fatigue during ambulation   Baseline 3+/5, 12/20/15 - 3+/5   Time 4   Period Weeks   Status On-going             Long Term Clinic Goals - 11/09/15 1618    CC Long Term Goal  #1   Title Pt to demonstate 160 degrees of bilateral shoulder flexion to allow pt to reach overhead   Baseline R 76, L 85   Time 8   Period Weeks   Status New   CC Long Term Goal  #2   Title Pt to demonstrate 160 degrees of bilateral shoulder abduction to allow pt to reach items out to sides   Baseline R 70, L 60   Time 8   Period Weeks   Status New   CC Long Term Goal  #3   Title Pt to demonstrate gross shoulder strength of 4/5 to allow pt to return to prior level of function   Baseline 2+/5   Time 8   Period Weeks   Status New   CC Long Term Goal  #4   Title Pt to be independent in a home exercise program for strengthening, ROM and posture for long term management   Baseline NA   Time 8   Period Weeks   Status New   CC Long Term Goal  #5   Title Pt to improve quick DASH score to less than or equal to 30% impairment   Baseline 50%   Time 8   Period Weeks   Status New            Plan - 12/20/15 1210    Clinical Impression Statement Assessed progress towards goals. Pt has met short term range of motion goals on R side and is progressing towards goals on the L side. She is having less pain in her left with PROM. She feels that PROM and myofascial release if really helping her. She has decreased scapular mobility on the L compared to right.    Rehab Potential Good   Clinical Impairments Affecting Rehab Potential pt began radiation to lung , pt had fall in driveway 0/1/75 requiring  stitches at Rt eyebrow line   PT Frequency 2x /  week   PT Duration 8 weeks   PT Treatment/Interventions Therapeutic exercise;Manual techniques;Passive range of motion   PT Next Visit Plan Cont PROM to bilateral shoulders, cont AAROM and cont posture exercises, review wall washing exercise   PT Home Exercise Plan see education section   Consulted and Agree with Plan of Care Patient      Patient will benefit from skilled therapeutic intervention in order to improve the following deficits and impairments:  Postural dysfunction, Decreased range of motion, Impaired UE functional use, Pain, Decreased strength, Decreased activity tolerance, Decreased endurance  Visit Diagnosis: Muscle weakness (generalized)  Left shoulder pain  Limitation of joint motion of right shoulder  Limitation of joint motion of left shoulder  Right shoulder pain  Abnormal posture     Problem List Patient Active Problem List   Diagnosis Date Noted  . Paronychia 11/17/2015  . Paronychia of great toe, right 08/21/2015  . Paronychia of great toe, left 08/21/2015  . Nausea without vomiting 04/24/2015  . Feeding difficulties 04/11/2015  . Encounter for antineoplastic chemotherapy 03/06/2015  . Hypokalemia 02/15/2015  . Protein-calorie malnutrition ([place]) 02/15/2015  . Neutropenic fever ([place]) 02/11/2015  . Neutropenia with fever ([place]) 02/11/2015  . Bicytopenia 02/11/2015  . Diarrhea 02/11/2015  . Malignant neoplasm of upper lobe of right lung ([place]) 01/05/2015  . 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 Bone metastases ([place]) 01/03/2015  . Physical exam 10/13/2014  . Osteopenia 10/07/2013  . Benign positional vertigo 05/08/2012  . TINNITUS, LEFT 01/09/2009  . CARDIAC MURMUR, AORTIC 01/05/2007  . MOTION SICKNESS 01/05/2007    Alexia Freestone 12/20/2015, 12:13 PM  [place], [place], [address] Phone: [phone]   Fax:   [phone]  Name: Laura Walter MRN: [id number] Date of Birth: 1951-01-17    Allyson Sabal, PT 12/20/2015 12:13 PM

## 2015-12-22 ENCOUNTER — Encounter: Payer: BC Managed Care – PPO | Admitting: Physical Therapy

## 2015-12-27 ENCOUNTER — Ambulatory Visit: Payer: BC Managed Care – PPO | Admitting: Physical Therapy

## 2015-12-27 ENCOUNTER — Encounter: Payer: Self-pay | Admitting: Physical Therapy

## 2015-12-27 DIAGNOSIS — M25611 Stiffness of right shoulder, not elsewhere classified: Secondary | ICD-10-CM

## 2015-12-27 DIAGNOSIS — M6281 Muscle weakness (generalized): Secondary | ICD-10-CM

## 2015-12-27 DIAGNOSIS — M25512 Pain in left shoulder: Secondary | ICD-10-CM

## 2015-12-27 DIAGNOSIS — M25612 Stiffness of left shoulder, not elsewhere classified: Secondary | ICD-10-CM

## 2015-12-27 DIAGNOSIS — M25511 Pain in right shoulder: Secondary | ICD-10-CM

## 2015-12-27 DIAGNOSIS — R293 Abnormal posture: Secondary | ICD-10-CM

## 2015-12-27 NOTE — Therapy (Signed)
[facility] [place], [place], [address] Phone: [phone]   Fax:  [phone]  Physical Therapy Treatment  Patient Details  Name: Laura Walter MRN: [id number] Date of Birth: [birth year] Referring Provider: Shona Simpson  Encounter Date: 12/27/2015      PT End of Session - 12/27/15 1202    Visit Number 9   Number of Visits 17   Date for PT Re-Evaluation 01/04/16   PT Start Time 1115   PT Stop Time 1155   PT Time Calculation (min) 40 min   Activity Tolerance Patient tolerated treatment well   Behavior During Therapy [facility] for tasks assessed/performed      Past Medical History  Diagnosis Date  . Pneumonia 2009 & 2011  . Allergy     seasonal  . H/O reactive hypoglycemia   . Osteopenia   . IBS (irritable bowel syndrome)   . Bronchitis   . S/P radiation therapy completed 02/08/15    whole brain ,spine  . Brain cancer ([place]) 01/13/15    MRI multiple small brain mets 10 individual lesions  . Bone cancer ([place]) 01/10/15 PET    lytic lesion lt 1st rib,T9,L3 Lt acetabulum  . Lung cancer ([place])     stageIV nscca rul  . Paronychia 11/17/2015    Past Surgical History  Procedure Laterality Date  . Colonoscopy  2009    Negative; [facility]  . Tonsillectomy and adenoidectomy    . Wisdom tooth extraction    . Esophagogastroduodenoscopy (egd) with propofol N/A 04/11/2015    Procedure: ESOPHAGOGASTRODUODENOSCOPY (EGD) WITH PROPOFOL;  Surgeon: Carol Ada, MD;  Location: [facility];  Service: Endoscopy;  Laterality: N/A;  . Peg placement N/A 04/11/2015    Procedure: PERCUTANEOUS ENDOSCOPIC GASTROSTOMY (PEG) PLACEMENT;  Surgeon: Carol Ada, MD;  Location: [facility];  Service: Endoscopy;  Laterality: N/A;    There were no vitals filed for this visit.      Subjective Assessment - 12/27/15 1115    Subjective I did pretty good after last time especially on my exercises.    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 Pt now to undergo radiation to R lung.    Patient Stated Goals increase strength of bilateral LEs and UEs, stand from a chair easier   Currently in Pain? No/denies   Pain Score 0-No pain                         [facility] Adult PT Treatment/Exercise - 12/27/15 0001    Shoulder Exercises: Seated   Row Strengthening;Both;10 reps;Theraband  x 2 sets   Theraband Level (Shoulder Row) Level 1 (Yellow)   Other Seated Exercises neck retraction x 10 reps with 3 sec holds   Manual Therapy   Manual Therapy Myofascial release;Scapular mobilization   Myofascial Release Gentle UE myofascial pulling through partial abduction ROM, right and left in supine   Scapular Mobilization In sidelying on each side, mobilization into protraction and depression, rhythmic   Passive ROM In supine to right and left shoulders into ER, abduction, and flexion.  PT Education - 12/27/15 1201    Education provided Yes   Education Details add shoulder rows to HEP   Person(s) Educated Patient   Methods Explanation;Demonstration   Comprehension Verbalized understanding;Returned demonstration           Short Term Clinic Goals - 12/20/15 1140    CC Short Term Goal  #1   Title Pt to demonstrate 105 degrees of shoulder flexion bilaterally to allow pt to reach items on shelves.    Baseline R 76, L 85, Rt 94, Lt 85 11/30/15, Rt 105, Lt 100 degree 12/13/15, 12/20/15- L- 98, R - 109, pt states there is less pain while lifting L   Time 4   Period Weeks   Status Partially Met   CC Short Term Goal  #2   Title Pt to demonstrate 90 degrees of shoulder  abduction bilaterally to allow pt to reach out to sides   Baseline R 70, L 60, Rt 84, Lt 69 11/30/15, Rt 80 Lt 72 degrees 12/13/15, 12/20/15- L - 82, R - 95 degrees   Time 4   Period Weeks   Status Partially Met   CC Short Term Goal  #3   Title Pt to demonstrate 3+/5 tricep strength to allow pt to push to stand without pain   Baseline 3/5, 12/20/15- R 3+/5, L 3/5   Time 4   Period Weeks   Status Partially Met   CC Short Term Goal  #4   Title Pt to demonstate 4/5 hip flexor strength to decrease fatigue during ambulation   Baseline 3+/5, 12/20/15 - 3+/5   Time 4   Period Weeks   Status On-going             Long Term Clinic Goals - 11/09/15 1618    CC Long Term Goal  #1   Title Pt to demonstate 160 degrees of bilateral shoulder flexion to allow pt to reach overhead   Baseline R 76, L 85   Time 8   Period Weeks   Status New   CC Long Term Goal  #2   Title Pt to demonstrate 160 degrees of bilateral shoulder abduction to allow pt to reach items out to sides   Baseline R 70, L 60   Time 8   Period Weeks   Status New   CC Long Term Goal  #3   Title Pt to demonstrate gross shoulder strength of 4/5 to allow pt to return to prior level of function   Baseline 2+/5   Time 8   Period Weeks   Status New   CC Long Term Goal  #4   Title Pt to be independent in a home exercise program for strengthening, ROM and posture for long term management   Baseline NA   Time 8   Period Weeks   Status New   CC Long Term Goal  #5   Title Pt to improve quick DASH score to less than or equal to 30% impairment   Baseline 50%   Time 8   Period Weeks   Status New            Plan - 12/27/15 1202    Clinical Impression Statement PT tolerated all PROM well today. Her left side is still tighter than her right and there is decreased scapular mobility on the L. Gave pt two new exercises to improve posture today and pt educated to add shulder rows to current HEP with yellow theraband.  Clinical  Impairments Affecting Rehab Potential pt began radiation to lung , pt had fall in driveway 10/04/97 requiring stitches at Rt eyebrow line   PT Frequency 2x / week   PT Duration 8 weeks   PT Treatment/Interventions Therapeutic exercise;Manual techniques;Passive range of motion   PT Next Visit Plan Cont PROM to bilateral shoulders, cont AAROM and cont posture exercises, review wall washing exercise   PT Home Exercise Plan add shoulder rows   Consulted and Agree with Plan of Care Patient      Patient will benefit from skilled therapeutic intervention in order to improve the following deficits and impairments:  Postural dysfunction, Decreased range of motion, Impaired UE functional use, Pain, Decreased strength, Decreased activity tolerance, Decreased endurance  Visit Diagnosis: Muscle weakness (generalized)  Left shoulder pain  Limitation of joint motion of right shoulder  Limitation of joint motion of left shoulder  Right shoulder pain  Abnormal posture     Problem List Patient Active Problem List   Diagnosis Date Noted  . Paronychia 11/17/2015  . Paronychia of great toe, right 08/21/2015  . Paronychia of great toe, left 08/21/2015  . Nausea without vomiting 04/24/2015  . Feeding difficulties 04/11/2015  . Encounter for antineoplastic chemotherapy 03/06/2015  . Hypokalemia 02/15/2015  . Protein-calorie malnutrition ([place]) 02/15/2015  . Neutropenic fever ([place]) 02/11/2015  . Neutropenia with fever ([place]) 02/11/2015  . Bicytopenia 02/11/2015  . Diarrhea 02/11/2015  . Malignant neoplasm of upper lobe of right lung ([place]) 01/05/2015  . 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 Bone metastases ([place]) 01/03/2015  . Physical exam 10/13/2014  . Osteopenia 10/07/2013  . Benign positional vertigo 05/08/2012  . TINNITUS, LEFT 01/09/2009  . CARDIAC MURMUR, AORTIC 01/05/2007  . MOTION SICKNESS 01/05/2007    Alexia Freestone 12/27/2015, 12:06 PM  [place], [place], [address] Phone: [phone]   Fax:  [phone]  Name: MALLORI ARAQUE MRN: [id number] Date of Birth: 1951/03/07    Allyson Sabal, PT 12/27/2015 12:07 PM

## 2015-12-28 ENCOUNTER — Ambulatory Visit: Payer: BC Managed Care – PPO

## 2015-12-28 DIAGNOSIS — M25512 Pain in left shoulder: Secondary | ICD-10-CM

## 2015-12-28 DIAGNOSIS — R293 Abnormal posture: Secondary | ICD-10-CM

## 2015-12-28 DIAGNOSIS — M25611 Stiffness of right shoulder, not elsewhere classified: Secondary | ICD-10-CM

## 2015-12-28 DIAGNOSIS — M25511 Pain in right shoulder: Secondary | ICD-10-CM

## 2015-12-28 DIAGNOSIS — M25612 Stiffness of left shoulder, not elsewhere classified: Secondary | ICD-10-CM

## 2015-12-28 DIAGNOSIS — M6281 Muscle weakness (generalized): Secondary | ICD-10-CM

## 2015-12-28 NOTE — Therapy (Signed)
[facility] [place], [place], [address] Phone: [phone]   Fax:  [phone]  Physical Therapy Treatment  Patient Details  Name: Laura Walter MRN: [id number] Date of Birth: 1951/02/10 Referring Provider: Shona Simpson  Encounter Date: 12/28/2015      PT End of Session - 12/28/15 1204    Visit Number 10   Number of Visits 17   Date for PT Re-Evaluation 01/04/16   PT Start Time 1110   PT Stop Time 1150   PT Time Calculation (min) 40 min   Activity Tolerance Patient tolerated treatment well   Behavior During Therapy [facility] for tasks assessed/performed      Past Medical History  Diagnosis Date  . Pneumonia 2009 & 2011  . Allergy     seasonal  . H/O reactive hypoglycemia   . Osteopenia   . IBS (irritable bowel syndrome)   . Bronchitis   . S/P radiation therapy completed 02/08/15    whole brain ,spine  . Brain cancer ([place]) 01/13/15    MRI multiple small brain mets 10 individual lesions  . Bone cancer ([place]) 01/10/15 PET    lytic lesion lt 1st rib,T9,L3 Lt acetabulum  . Lung cancer ([place])     stageIV nscca rul  . Paronychia 11/17/2015    Past Surgical History  Procedure Laterality Date  . Colonoscopy  2009    Negative; [facility]  . Tonsillectomy and adenoidectomy    . Wisdom tooth extraction    . Esophagogastroduodenoscopy (egd) with propofol N/A 04/11/2015    Procedure: ESOPHAGOGASTRODUODENOSCOPY (EGD) WITH PROPOFOL;  Surgeon: Carol Ada, MD;  [facility];  Service: Endoscopy;  Laterality: N/A;  . Peg placement N/A 04/11/2015    Procedure: PERCUTANEOUS ENDOSCOPIC GASTROSTOMY (PEG) PLACEMENT;  Surgeon: Carol Ada, MD;  [facility];  Service: Endoscopy;  Laterality: N/A;    There were no vitals filed for this visit.      Subjective Assessment - 12/28/15 1112    Subjective Felt fine after yesterdays session. My Rt shoulder is doing great, acting like it is supposed too.    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 Pt now to undergo radiation to R lung.    Patient Stated Goals increase strength of bilateral LEs and UEs, stand from a chair easier   Currently in Pain? No/denies            [facility] PT Assessment - 12/28/15 0001    AROM   Right Shoulder Flexion 122 Degrees   Right Shoulder ABduction 105 Degrees   Left Shoulder Flexion 97 Degrees   Left Shoulder ABduction 79 Degrees                     [facility] Adult PT Treatment/Exercise - 12/28/15 0001    Shoulder Exercises: Seated   Row Strengthening;Both;10 reps;Theraband   Theraband Level (Shoulder Row) Level 1 (Yellow)   Shoulder Exercises: Sidelying   External Rotation AROM;AAROM;Both;5 reps  2 sets of 5 reps   External Rotation Limitations First set A/ROM then second set AA/ROM for end range after manual techniques   Manual Therapy   Manual Therapy Myofascial release;Scapular mobilization  Myofascial Release Gentle UE myofascial pulling through partial abduction ROM, right and left in supine   Scapular Mobilization In sidelying on each side, mobilization into protraction and depression, rhythmic   Passive ROM In supine to right and left shoulders into ER, abduction, and flexion and in S/L for each side abduction with scapular depression by therapist. Pt reported this felt good, especially on Rt side.                 PT Education - 12/27/15 1201    Education provided Yes   Education Details add shoulder rows to HEP   Person(s) Educated Patient   Methods Explanation;Demonstration   Comprehension Verbalized understanding;Returned  demonstration           Short Term Clinic Goals - 12/28/15 1207    CC Short Term Goal  #1   Title Pt to demonstrate 105 degrees of shoulder flexion bilaterally to allow pt to reach items on shelves.    Baseline R 76, L 85, Rt 94, Lt 85 11/30/15, Rt 105, Lt 100 degree 12/13/15, 12/20/15- L- 98, R - 109, pt states there is less pain while lifting L, Rt 122 and Lt 97 degrees 12/28/15   Status Partially Met   CC Short Term Goal  #2   Title Pt to demonstrate 90 degrees of shoulder abduction bilaterally to allow pt to reach out to sides   Baseline R 70, L 60, Rt 84, Lt 69 11/30/15, Rt 80 Lt 72 degrees 12/13/15, 12/20/15- L - 82, R - 95 degrees, Rt 105 and Lt 79 degrees 12/28/15   Status Partially Met   CC Short Term Goal  #3   Title Pt to demonstrate 3+/5 tricep strength to allow pt to push to stand without pain   Status On-going   CC Short Term Goal  #4   Title Pt to demonstate 4/5 hip flexor strength to decrease fatigue during ambulation   Status On-going             Long Term Clinic Goals - 11/09/15 1618    CC Long Term Goal  #1   Title Pt to demonstate 160 degrees of bilateral shoulder flexion to allow pt to reach overhead   Baseline R 76, L 85   Time 8   Period Weeks   Status New   CC Long Term Goal  #2   Title Pt to demonstrate 160 degrees of bilateral shoulder abduction to allow pt to reach items out to sides   Baseline R 70, L 60   Time 8   Period Weeks   Status New   CC Long Term Goal  #3   Title Pt to demonstrate gross shoulder strength of 4/5 to allow pt to return to prior level of function   Baseline 2+/5   Time 8   Period Weeks   Status New   CC Long Term Goal  #4   Title Pt to be independent in a home exercise program for strengthening, ROM and posture for long term management   Baseline NA   Time 8   Period Weeks   Status New   CC Long Term Goal  #5   Title Pt to improve quick DASH score to less than or equal to 30% impairment   Baseline 50%   Time 8    Period Weeks   Status New            Plan - 12/28/15 1205    Clinical Impression Statement  Pt reports feeling like therapy has been very beneficial in regaining her ROM and reducing her pain. Most of her A/ROM mesaurements have improved from last time mesaured showing progress towards goals.    Rehab Potential Good   Clinical Impairments Affecting Rehab Potential pt began radiation to lung , pt had fall in driveway 01/11/78 requiring stitches at Rt eyebrow line   PT Frequency 2x / week   PT Duration 8 weeks   PT Treatment/Interventions Therapeutic exercise;Manual techniques;Passive range of motion   PT Next Visit Plan Cont PROM to bilateral shoulders, cont AAROM and cont posture exercises, review wall washing exercise. Add tricep extnesion with band in door.    Consulted and Agree with Plan of Care Patient      Patient will benefit from skilled therapeutic intervention in order to improve the following deficits and impairments:  Postural dysfunction, Decreased range of motion, Impaired UE functional use, Pain, Decreased strength, Decreased activity tolerance, Decreased endurance  Visit Diagnosis: Muscle weakness (generalized)  Left shoulder pain  Limitation of joint motion of right shoulder  Limitation of joint motion of left shoulder  Right shoulder pain  Abnormal posture     Problem List Patient Active Problem List   Diagnosis Date Noted  . Paronychia 11/17/2015  . Paronychia of great toe, right 08/21/2015  . Paronychia of great toe, left 08/21/2015  . Nausea without vomiting 04/24/2015  . Feeding difficulties 04/11/2015  . Encounter for antineoplastic chemotherapy 03/06/2015  . Hypokalemia 02/15/2015  . Protein-calorie malnutrition ([place]) 02/15/2015  . Neutropenic fever ([place]) 02/11/2015  . Neutropenia with fever ([place]) 02/11/2015  . Bicytopenia 02/11/2015  . Diarrhea 02/11/2015  . Malignant neoplasm of upper lobe of right lung ([place]) 01/05/2015  . Bone marrow  disease 01/03/2015  . Bone metastases ([place]) 01/03/2015  . Physical exam 10/13/2014  . Osteopenia 10/07/2013  . Benign positional vertigo 05/08/2012  . TINNITUS, LEFT 01/09/2009  . CARDIAC MURMUR, AORTIC 01/05/2007  . MOTION SICKNESS 01/05/2007    Otelia Limes, PTA 12/28/2015, 12:09 PM  [place], [place], [address] Phone: [phone]   Fax:  [phone]  Name: TIENNA BIENKOWSKI MRN: [id number] Date of Birth: [birth year]

## 2015-12-29 ENCOUNTER — Encounter: Payer: Self-pay | Admitting: Radiation Oncology

## 2015-12-29 ENCOUNTER — Encounter: Payer: BC Managed Care – PPO | Admitting: Physical Therapy

## 2016-01-02 MED FILL — *GILOTRIF 30 MG TABLET: 30 | 30 days supply | Qty: 30 | Fill #1

## 2016-01-03 ENCOUNTER — Ambulatory Visit
Admission: RE | Admit: 2016-01-03 | Discharge: 2016-01-03 | Disposition: A | Discharge status: Home / Self Care | Payer: BC Managed Care – PPO | Source: Ambulatory Visit | Attending: Radiation Oncology | Admitting: Radiation Oncology

## 2016-01-03 ENCOUNTER — Ambulatory Visit: Payer: BC Managed Care – PPO

## 2016-01-03 ENCOUNTER — Encounter: Payer: Self-pay | Admitting: Radiation Oncology

## 2016-01-03 VITALS — BP 118/76 | HR 82 | Temp 97.8°F | Resp 16 | Ht 67.0 in | Wt 135.8 lb

## 2016-01-03 DIAGNOSIS — M25612 Stiffness of left shoulder, not elsewhere classified: Secondary | ICD-10-CM

## 2016-01-03 DIAGNOSIS — M6281 Muscle weakness (generalized): Secondary | ICD-10-CM | POA: Diagnosis not present

## 2016-01-03 DIAGNOSIS — C3411 Malignant neoplasm of upper lobe, right bronchus or lung: Secondary | ICD-10-CM

## 2016-01-03 DIAGNOSIS — M25611 Stiffness of right shoulder, not elsewhere classified: Secondary | ICD-10-CM

## 2016-01-03 DIAGNOSIS — R293 Abnormal posture: Secondary | ICD-10-CM

## 2016-01-03 DIAGNOSIS — M25512 Pain in left shoulder: Secondary | ICD-10-CM

## 2016-01-03 DIAGNOSIS — M25511 Pain in right shoulder: Secondary | ICD-10-CM

## 2016-01-03 NOTE — Progress Notes (Signed)
All  Radiation Oncology         [phone] ________________________________  Name: Laura Walter MRN: [id number]  Date: 01/03/2016  DOB: [birth year]  Follow-Up Visit Note  CC: Laura Asa, MD  Laura Bears, MD  Diagnosis:   Stage IV (T2a, N3, M1b) Non-small lung cancer  Interval Since Last Radiation:  1 month   Narrative: Follow up s/p radiation Right upper lung completed 11/24/15, no pain,nausea, or coughing, still fatigued, takes glilotrif daily, next f/u Dr. Julien Walter 01/18/16 after ct chest on 6/12/1, eating regular foods, no difficulty swallowing , gets 1 can osmolite via peg daily, flushes after with 2 oz water. Patient states she is doing well, appetite good. No changes in vision.                                ALLERGIES:  is allergic to sulfonamide derivatives.  Meds: Current Outpatient Prescriptions  Medication Sig Dispense Refill  . GILOTRIF 30 MG tablet TAKE 1 TABLET BY MOUTH DAILY. TAKE ON AN EMPTY STOMACH 1 HOUR BEFORE OR 2 HOURS AFTER MEALS. 30 tablet 2  . ibuprofen (ADVIL,MOTRIN) 200 MG tablet Take 200 mg by mouth every 6 (six) hours as needed for moderate pain. Reported on 12/19/2015    . loperamide (IMODIUM) 2 MG capsule Take 2 mg by mouth as needed for diarrhea or loose stools (Up to 8 times daily, if needed.). Reported on 12/19/2015    . Nutritional Supplements (FEEDING SUPPLEMENT, OSMOLITE 1.5 CAL,) LIQD Place 237 mLs into feeding tube 4 (four) times daily. (Patient taking differently: Place 237 mLs into feeding tube 2 (two) times daily. ) 958 mL 12  . OLANZapine (ZYPREXA) 10 MG tablet Take 10 mg by mouth at bedtime. Reported on 11/24/2015     No current facility-administered medications for this encounter.    Physical Findings: The patient is in no acute distress. Patient is alert and oriented.  height is '5\' 7"'$  (1.702 m) and weight is 135 lb 12.8 oz (61.598 kg). Her oral temperature is 97.8 F (36.6 C). Her blood pressure is 118/76 and her pulse is 82. Her  respiration is 16 and oxygen saturation is 100%. .   No palpable cervical, supraclavicular or axillary lymphoadenopathy. The heart has a regular rate and rhythm. Decreased breath sounds which were clear throughout  Lab Findings: Lab Results  Component Value Date   WBC 3.6* 12/19/2015   HGB 10.3* 12/19/2015   HCT 31.9* 12/19/2015   MCV 92.6 12/19/2015   PLT 164 12/19/2015     Radiographic Findings: No results found.  Impression: The patient is diagnosed with Stage IV (T2a, N3, M1b) Non-small lung cancer. The patient is recovering from the effects of radiation. Her last brain MRI was in March. Therefore, I will order for a repeat MRI in early July.If this looks good, then we will further stagger her MRI scans more slowly.    Plan:  CT scan scheduled for 01/15/16. Follow up with Dr. Julien Walter after CT. Order for repeat brain MRI in early July (patient traveling between 6/17-6/24) and follow up in RadOnc after.    ------------------------------------------------  Jodelle Gross, MD, PhD    This document serves as a record of services personally performed by Kyung Rudd, MD. It was created on his behalf by Derek Mound, a trained medical scribe. The creation of this record is based on the scribe's personal observations and the provider's statements to them. This  document has been checked and approved by the attending provider.

## 2016-01-03 NOTE — Progress Notes (Addendum)
   Follow up s/p radiation  Right upper lung completed 11/24/15, no pain,nausea, or coughing, still fatigued, takes glilotrif daily, next f/u Dr. Julien Nordmann 01/18/16 after ct chest on 6/12/1, eating regular foods, no difficulty swallowing , gets  1 can osmolite via peg daily, flushes after with 2 oz water, BP 118/76 mmHg  Pulse 82  Temp(Src) 97.8 F (36.6 C) (Oral)  Resp 16  Ht '5\' 7"'$  (1.702 m)  Wt 135 lb 12.8 oz (61.598 kg)  BMI 21.26 kg/m2  SpO2 100%  Wt Readings from Last 3 Encounters:  01/03/16 135 lb 12.8 oz (61.598 kg)  12/19/15 132 lb 3.2 oz (59.966 kg)  11/24/15 132 lb 6.4 oz (60.056 kg)

## 2016-01-03 NOTE — Therapy (Signed)
[place], [place], [address] Phone: [phone]   Fax:  [phone]  Physical Therapy Treatment  Patient Details  Name: Laura Walter MRN: [id number] Date of Birth: Dec 15, 1950 Referring Provider: Shona Simpson  Encounter Date: 01/03/2016      PT End of Session - 01/03/16 1148    Visit Number 11   Number of Visits 17   Date for PT Re-Evaluation 01/04/16   PT Start Time 1104   PT Stop Time 1145   PT Time Calculation (min) 41 min   Activity Tolerance Patient tolerated treatment well   Behavior During Therapy [facility] for tasks assessed/performed      Past Medical History  Diagnosis Date  . Pneumonia 2009 & 2011  . Allergy     seasonal  . H/O reactive hypoglycemia   . Osteopenia   . IBS (irritable bowel syndrome)   . Bronchitis   . S/P radiation therapy completed 02/08/15    whole brain ,spine  . Brain cancer ([place]) 01/13/15    MRI multiple small brain mets 10 individual lesions  . Bone cancer ([place]) 01/10/15 PET    lytic lesion lt 1st rib,T9,L3 Lt acetabulum  . Lung cancer ([place])     stageIV nscca rul  . Paronychia 11/17/2015  . S/P radiation therapy completed 11/27/15    lung    Past Surgical History  Procedure Laterality Date  . Colonoscopy  2009    Negative; [facility]  . Tonsillectomy and adenoidectomy    . Wisdom tooth extraction    . Esophagogastroduodenoscopy (egd) with propofol N/A 04/11/2015    Procedure: ESOPHAGOGASTRODUODENOSCOPY (EGD) WITH PROPOFOL;  Surgeon: Carol Ada, MD;  Location: [facility];  Service: Endoscopy;  Laterality: N/A;  . Peg placement N/A 04/11/2015    Procedure: PERCUTANEOUS ENDOSCOPIC GASTROSTOMY (PEG) PLACEMENT;  Surgeon: Carol Ada, MD;  Location: [facility];  Service: Endoscopy;  Laterality: N/A;    There were no vitals filed for this visit.      Subjective Assessment - 01/03/16 1116    Subjective Feel like my shoulders keep getting better.    Pertinent  History This is a very pleasant 65 years old never smoker white female recently diagnosed with stage IV non-small cell lung cancer, adenocarcinoma with positive EGFR mutation with deletion in exon 19 presented with large right upper lobe lung mass in addition to bilateral mediastinal, supraclavicular lymphadenopathy as well as bone and multiple brain metastasis diagnosed in June 2016. Pt now to undergo radiation to R lung.    Patient Stated Goals increase strength of bilateral LEs and UEs, stand from a chair easier   Currently in Pain? No/denies            [facility] PT Assessment - 01/03/16 0001    AROM   Right Shoulder Flexion 115 Degrees  Before stretching   Right Shoulder ABduction 90 Degrees   Left Shoulder Flexion 84 Degrees  Tightness with some pain at end range   Left Shoulder ABduction 74 Degrees                     [facility] Adult PT Treatment/Exercise - 01/03/16 0001    Shoulder Exercises: Supine   Flexion AAROM;Both;10 reps  With dowel rod   Shoulder Exercises: Sidelying   External Rotation Limitations First set A/ROM then second set AA/ROM for end range after manual techniques   Shoulder Exercises: Pulleys   Flexion 2 minutes   Flexion Limitations Cuing to decrease  shoulder hike   ABduction 2 minutes   Shoulder Exercises: Stretch   External Rotation Stretch 3 reps;10 seconds  Bil using doorframe   Manual Therapy   Manual Therapy Myofascial release;Scapular mobilization   Myofascial Release Gentle UE myofascial pulling through partial abduction ROM, right and left in supine   Scapular Mobilization In sidelying on each side, mobilization into protraction and depression, rhythmic   Passive ROM In supine to right and left shoulders into ER, abduction, and flexion and in S/L for each side abduction with scapular depression by therapist. Pt reported this felt good, especially on Rt side.                    Short Term Clinic Goals - 12/28/15 1207    CC  Short Term Goal  #1   Title Pt to demonstrate 105 degrees of shoulder flexion bilaterally to allow pt to reach items on shelves.    Baseline R 76, L 85, Rt 94, Lt 85 11/30/15, Rt 105, Lt 100 degree 12/13/15, 12/20/15- L- 98, R - 109, pt states there is less pain while lifting L, Rt 122 and Lt 97 degrees 12/28/15   Status Partially Met   CC Short Term Goal  #2   Title Pt to demonstrate 90 degrees of shoulder abduction bilaterally to allow pt to reach out to sides   Baseline R 70, L 60, Rt 84, Lt 69 11/30/15, Rt 80 Lt 72 degrees 12/13/15, 12/20/15- L - 82, R - 95 degrees, Rt 105 and Lt 79 degrees 12/28/15   Status Partially Met   CC Short Term Goal  #3   Title Pt to demonstrate 3+/5 tricep strength to allow pt to push to stand without pain   Status On-going   CC Short Term Goal  #4   Title Pt to demonstate 4/5 hip flexor strength to decrease fatigue during ambulation   Status On-going             Long Term Clinic Goals - 11/09/15 1618    CC Long Term Goal  #1   Title Pt to demonstate 160 degrees of bilateral shoulder flexion to allow pt to reach overhead   Baseline R 76, L 85   Time 8   Period Weeks   Status New   CC Long Term Goal  #2   Title Pt to demonstrate 160 degrees of bilateral shoulder abduction to allow pt to reach items out to sides   Baseline R 70, L 60   Time 8   Period Weeks   Status New   CC Long Term Goal  #3   Title Pt to demonstrate gross shoulder strength of 4/5 to allow pt to return to prior level of function   Baseline 2+/5   Time 8   Period Weeks   Status New   CC Long Term Goal  #4   Title Pt to be independent in a home exercise program for strengthening, ROM and posture for long term management   Baseline NA   Time 8   Period Weeks   Status New   CC Long Term Goal  #5   Title Pt to improve quick DASH score to less than or equal to 30% impairment   Baseline 50%   Time 8   Period Weeks   Status New            Plan - 01/03/16 1148    Clinical  Impression Statement Remeasured A/ROM today from last  week which had reduced some but pt reports she did not perform her exercises all weekend due to being out of town. Overall though her Lt shoulder has been hurting her less, especially during P/ROM when stretching.    Rehab Potential Good   Clinical Impairments Affecting Rehab Potential pt began radiation to lung , pt had fall in driveway 11/09/80 requiring stitches at Rt eyebrow line   PT Frequency 2x / week   PT Duration 8 weeks   PT Treatment/Interventions Therapeutic exercise;Manual techniques;Passive range of motion   PT Next Visit Plan Recert next visit. Cont PROM to bilateral shoulders, cont AAROM/pulleys and cont posture exercises, review wall washing exercise. Add tricep extnesion with band in door.    Consulted and Agree with Plan of Care Patient      Patient will benefit from skilled therapeutic intervention in order to improve the following deficits and impairments:  Postural dysfunction, Decreased range of motion, Impaired UE functional use, Pain, Decreased strength, Decreased activity tolerance, Decreased endurance  Visit Diagnosis: Muscle weakness (generalized)  Left shoulder pain  Limitation of joint motion of right shoulder  Limitation of joint motion of left shoulder  Right shoulder pain  Abnormal posture     Problem List Patient Active Problem List   Diagnosis Date Noted  . Paronychia 11/17/2015  . Paronychia of great toe, right 08/21/2015  . Paronychia of great toe, left 08/21/2015  . Nausea without vomiting 04/24/2015  . Feeding difficulties 04/11/2015  . Encounter for antineoplastic chemotherapy 03/06/2015  . Hypokalemia 02/15/2015  . Protein-calorie malnutrition ([place]) 02/15/2015  . Neutropenic fever ([place]) 02/11/2015  . Neutropenia with fever ([place]) 02/11/2015  . Bicytopenia 02/11/2015  . Diarrhea 02/11/2015  . Malignant neoplasm of upper lobe of right lung ([place]) 01/05/2015  . Bone marrow disease  01/03/2015  . Bone metastases ([place]) 01/03/2015  . Physical exam 10/13/2014  . Osteopenia 10/07/2013  . Benign positional vertigo 05/08/2012  . TINNITUS, LEFT 01/09/2009  . CARDIAC MURMUR, AORTIC 01/05/2007  . MOTION SICKNESS 01/05/2007    Otelia Limes, PTA 01/03/2016, 11:53 AM  [place], [place], [address] Phone: [phone]   Fax:  [phone]  Name: Laura Walter MRN: [id number] Date of Birth: 24-Nov-1950

## 2016-01-05 ENCOUNTER — Ambulatory Visit: Payer: BC Managed Care – PPO | Attending: Radiation Oncology | Admitting: Physical Therapy

## 2016-01-05 DIAGNOSIS — M6281 Muscle weakness (generalized): Secondary | ICD-10-CM | POA: Diagnosis present

## 2016-01-05 DIAGNOSIS — R293 Abnormal posture: Secondary | ICD-10-CM | POA: Diagnosis present

## 2016-01-05 DIAGNOSIS — M25512 Pain in left shoulder: Secondary | ICD-10-CM | POA: Insufficient documentation

## 2016-01-05 NOTE — Therapy (Signed)
[place], [place], [address] Phone: [phone]   Fax:  [phone]  Physical Therapy Treatment  Patient Details  Name: Laura Walter MRN: [id number] Date of Birth: [birth year] Referring Provider: Shona Simpson  Encounter Date: 01/05/2016      PT End of Session - 01/05/16 1225    Visit Number 12   Number of Visits 17   PT Start Time 1110   PT Stop Time 1200   PT Time Calculation (min) 50 min   Activity Tolerance Patient tolerated treatment well   Behavior During Therapy [facility] for tasks assessed/performed      Past Medical History  Diagnosis Date  . Pneumonia 2009 & 2011  . Allergy     seasonal  . H/O reactive hypoglycemia   . Osteopenia   . IBS (irritable bowel syndrome)   . Bronchitis   . S/P radiation therapy completed 02/08/15    whole brain ,spine  . Brain cancer ([place]) 01/13/15    MRI multiple small brain mets 10 individual lesions  . Bone cancer ([place]) 01/10/15 PET    lytic lesion lt 1st rib,T9,L3 Lt acetabulum  . Lung cancer ([place])     stageIV nscca rul  . Paronychia 11/17/2015  . S/P radiation therapy completed 11/27/15    lung    Past Surgical History  Procedure Laterality Date  . Colonoscopy  2009    Negative; [facility]  . Tonsillectomy and adenoidectomy    . Wisdom tooth extraction    . Esophagogastroduodenoscopy (egd) with propofol N/A 04/11/2015    Procedure: ESOPHAGOGASTRODUODENOSCOPY (EGD) WITH PROPOFOL;  Surgeon: Carol Ada, MD;  [facility];  Service: Endoscopy;  Laterality: N/A;  . Peg placement N/A 04/11/2015    Procedure: PERCUTANEOUS ENDOSCOPIC GASTROSTOMY (PEG) PLACEMENT;  Surgeon: Carol Ada, MD;  [facility];  Service: Endoscopy;  Laterality: N/A;    There were no vitals filed for this visit.      Subjective Assessment - 01/05/16 1115    Subjective " my shoulders are geting better'  She feels Ok , tired . She thinks she is ready to discontinue  PT and continue with her exercise at home. [facility] also [facility] [place] [facility]   Pertinent History This is a very pleasant 65 years old never smoker white female recently diagnosed with stage IV non-small cell lung cancer, adenocarcinoma with positive EGFR mutation with deletion in exon 19 presented with large right upper lobe lung mass in addition to bilateral mediastinal, supraclavicular lymphadenopathy as well as bone and multiple brain metastasis diagnosed in June 2016. Pt now to undergo radiation to R lung.    Patient Stated Goals increase strength of bilateral LEs and UEs, stand from a chair easier   Currently in Pain? No/denies  She says she has some pain in left shoulder when she moves a certain way             [facility] PT Assessment - 01/05/16 0001    AROM   Right Shoulder Flexion 124 Degrees   Right Shoulder ABduction 108 Degrees   Left Shoulder Flexion 114 Degrees   Left Shoulder ABduction 87 Degrees   Strength   Right Hip Flexion 4+/5   Left Hip Flexion 4+/5              Quick Dash - 01/05/16 0001    Open a tight or new jar Moderate difficulty   Do heavy household chores (wash walls,  wash floors) Moderate difficulty   Carry a shopping bag or briefcase No difficulty   Wash your back Moderate difficulty   Use a knife to cut food No difficulty   Recreational activities in which you take some force or impact through your arm, shoulder, or hand (golf, hammering, tennis) Mild difficulty   During the past week, to what extent has your arm, shoulder or hand problem interfered with your normal social activities with family, friends, neighbors, or groups? Slightly   During the past week, to what extent has your arm, shoulder or hand problem limited your work or other regular daily activities Slightly   Arm, shoulder, or hand pain. Mild   Tingling (pins and needles) in your arm, shoulder, or hand None   Difficulty Sleeping Mild difficulty   DASH Score 25  %               [facility] Adult PT Treatment/Exercise - 01/05/16 0001    Lumbar Exercises: Supine   Bridge 10 reps   Knee/Hip Exercises: Standing   Forward Step Up Both;2 sets;5 reps;Step Height: 4"   Other Standing Knee Exercises wall taps x 10 reps    Other Standing Knee Exercises wall slide to partial knee flexion x 5    Knee/Hip Exercises: Seated   Marching AROM;Both;10 reps   Shoulder Exercises: Seated   Retraction AROM;Both;10 reps   External Rotation AROM;Both;5 reps   Other Seated Exercises UE ranger to both arms in sitting with forward flexion and extension, progressing to including scapular motion    Shoulder Exercises: Sidelying   External Rotation AROM;Left;10 reps   Shoulder Exercises: Pulleys   Flexion 2 minutes   Manual Therapy   Joint Mobilization gentle oscillations to A-C and G-H joint   Myofascial Release prolonged pressure on trigger point release    Scapular Mobilization In sidelying on each side, mobilization into protraction and depression, rhythmic   Passive ROM to both arms and left arm withing pain tolerance                 PT Education - 01/05/16 1225    Education provided Yes   Education Details [employer] program    Person(s) Educated Patient   Methods Explanation;Demonstration;Handout   Comprehension Verbalized understanding;Returned demonstration           Short Term Clinic Goals - 01/05/16 1152    CC Short Term Goal  #1   Title Pt to demonstrate 105 degrees of shoulder flexion bilaterally to allow pt to reach items on shelves.    Status Achieved   CC Short Term Goal  #2   Title Pt to demonstrate 90 degrees of shoulder abduction bilaterally to allow pt to reach out to sides   Status Partially Met   CC Short Term Goal  #3   Title Pt to demonstrate 3+/5 tricep strength to allow pt to push to stand without pain   Status Achieved   CC Short Term Goal  #4   Title Pt to demonstate 4/5 hip flexor strength to decrease fatigue  during ambulation   Status Achieved             Long Term Clinic Goals - 01/05/16 1212    CC Long Term Goal  #1   Title Pt to demonstate 160 degrees of bilateral shoulder flexion to allow pt to reach overhead   Status Not Met   CC Long Term Goal  #2   Title Pt to demonstrate 160 degrees of  bilateral shoulder abduction to allow pt to reach items out to sides   Status Not Met   CC Long Term Goal  #3   Title Pt to demonstrate gross shoulder strength of 4/5 to allow pt to return to prior level of function   Baseline limited by pain in left shoulder    Status Not Met   CC Long Term Goal  #4   Title Pt to be independent in a home exercise program for strengthening, ROM and posture for long term management   Status Achieved   CC Long Term Goal  #5   Title Pt to improve quick DASH score to less than or equal to 30% impairment   Baseline 50 at baseline, 25 at discharge    Status Achieved            Plan - 01/05/16 1225    Clinical Impression Statement Pt feels that she is to much better than she was orginally that she would like to stop coming to physical therapy at this time and continue with her exercise on her own.  [facility] will get [facility] new [facility] if she wants to return    Rehab Potential Good   Clinical Impairments Affecting Rehab Potential pt began radiation to lung , pt had fall in driveway 08/13/48 requiring stitches at Rt eyebrow line   PT Next Visit Plan Discharge.      Patient will benefit from skilled therapeutic intervention in order to improve the following deficits and impairments:  Postural dysfunction, Decreased range of motion, Impaired UE functional use, Pain, Decreased strength, Decreased activity tolerance, Decreased endurance  Visit Diagnosis: Muscle weakness (generalized)  Left shoulder pain  Abnormal posture     Problem List Patient Active Problem List   Diagnosis Date Noted  . Paronychia 11/17/2015  . Paronychia of  great toe, right 08/21/2015  . Paronychia of great toe, left 08/21/2015  . Nausea without vomiting 04/24/2015  . Feeding difficulties 04/11/2015  . Encounter for antineoplastic chemotherapy 03/06/2015  . Hypokalemia 02/15/2015  . Protein-calorie malnutrition ([place]) 02/15/2015  . Neutropenic fever ([place]) 02/11/2015  . Neutropenia with fever ([place]) 02/11/2015  . Bicytopenia 02/11/2015  . Diarrhea 02/11/2015  . Malignant neoplasm of upper lobe of right lung ([place]) 01/05/2015  . 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 Bone metastases ([place]) 01/03/2015  . Physical exam 10/13/2014  . Osteopenia 10/07/2013  . Benign positional vertigo 05/08/2012  . TINNITUS, LEFT 01/09/2009  . CARDIAC MURMUR, AORTIC 01/05/2007  . MOTION SICKNESS 01/05/2007  PHYSICAL THERAPY DISCHARGE SUMMARY  Visits from Start of Care: 12  Current functional level related to goals / functional outcomes: Pt feels she is much better   Remaining deficits: Left shoulder pain and stiffness, right shoulder stiffness, generalized weakness   Education / Equipment: Home exercise   Plan: Patient agrees to discharge.  Patient goals were partially met. Patient is being discharged due to being pleased with the current functional level.  ?????       Maudry Diego, PT 01/05/2016 12:31 PM  [place], [place], [address] Phone: [phone]   Fax:  [phone]  Name: DICY SMIGEL MRN: [id number] Date of Birth: [birth year]

## 2016-01-10 ENCOUNTER — Ambulatory Visit: Payer: BC Managed Care – PPO | Admitting: Physical Therapy

## 2016-01-12 ENCOUNTER — Encounter: Payer: BC Managed Care – PPO | Admitting: Physical Therapy

## 2016-01-15 ENCOUNTER — Other Ambulatory Visit: Payer: BC Managed Care – PPO

## 2016-01-15 ENCOUNTER — Other Ambulatory Visit (HOSPITAL_BASED_OUTPATIENT_CLINIC_OR_DEPARTMENT_OTHER): Payer: BC Managed Care – PPO

## 2016-01-15 ENCOUNTER — Ambulatory Visit (HOSPITAL_COMMUNITY)
Admission: RE | Admit: 2016-01-15 | Discharge: 2016-01-15 | Disposition: A | Discharge status: Home / Self Care | Payer: BC Managed Care – PPO | Source: Ambulatory Visit | Attending: Internal Medicine | Admitting: Internal Medicine

## 2016-01-15 ENCOUNTER — Other Ambulatory Visit: Payer: Self-pay | Admitting: Radiation Therapy

## 2016-01-15 ENCOUNTER — Encounter (HOSPITAL_COMMUNITY): Payer: Self-pay

## 2016-01-15 ENCOUNTER — Encounter: Payer: Self-pay | Admitting: Radiation Therapy

## 2016-01-15 DIAGNOSIS — E46 Unspecified protein-calorie malnutrition: Secondary | ICD-10-CM | POA: Insufficient documentation

## 2016-01-15 DIAGNOSIS — C7931 Secondary malignant neoplasm of brain: Secondary | ICD-10-CM

## 2016-01-15 DIAGNOSIS — R918 Other nonspecific abnormal finding of lung field: Secondary | ICD-10-CM | POA: Diagnosis not present

## 2016-01-15 DIAGNOSIS — C7951 Secondary malignant neoplasm of bone: Secondary | ICD-10-CM | POA: Diagnosis not present

## 2016-01-15 DIAGNOSIS — C3411 Malignant neoplasm of upper lobe, right bronchus or lung: Secondary | ICD-10-CM

## 2016-01-15 DIAGNOSIS — J90 Pleural effusion, not elsewhere classified: Secondary | ICD-10-CM | POA: Insufficient documentation

## 2016-01-15 DIAGNOSIS — D491 Neoplasm of unspecified behavior of respiratory system: Secondary | ICD-10-CM | POA: Diagnosis not present

## 2016-01-15 DIAGNOSIS — Z9221 Personal history of antineoplastic chemotherapy: Secondary | ICD-10-CM | POA: Diagnosis not present

## 2016-01-15 DIAGNOSIS — Z5111 Encounter for antineoplastic chemotherapy: Secondary | ICD-10-CM

## 2016-01-15 DIAGNOSIS — C7949 Secondary malignant neoplasm of other parts of nervous system: Principal | ICD-10-CM

## 2016-01-15 LAB — CBC WITH DIFFERENTIAL/PLATELET
BASO%: 0.5 % (ref 0.0–2.0)
Basophils Absolute: 0 10*3/uL (ref 0.0–0.1)
EOS ABS: 0.2 10*3/uL (ref 0.0–0.5)
EOS%: 3.7 % (ref 0.0–7.0)
HCT: 33.3 % — ABNORMAL LOW (ref 34.8–46.6)
HGB: 10.8 g/dL — ABNORMAL LOW (ref 11.6–15.9)
LYMPH%: 11.2 % — ABNORMAL LOW (ref 14.0–49.7)
MCH: 30.3 pg (ref 25.1–34.0)
MCHC: 32.4 g/dL (ref 31.5–36.0)
MCV: 93.3 fL (ref 79.5–101.0)
MONO#: 0.4 10*3/uL (ref 0.1–0.9)
MONO%: 10 % (ref 0.0–14.0)
NEUT#: 3.1 10*3/uL (ref 1.5–6.5)
NEUT%: 74.6 % (ref 38.4–76.8)
NRBC: 0 % (ref 0–0)
PLATELETS: 169 10*3/uL (ref 145–400)
RBC: 3.57 10*6/uL — ABNORMAL LOW (ref 3.70–5.45)
RDW: 15.5 % — AB (ref 11.2–14.5)
WBC: 4.1 10*3/uL (ref 3.9–10.3)
lymph#: 0.5 10*3/uL — ABNORMAL LOW (ref 0.9–3.3)

## 2016-01-15 LAB — COMPREHENSIVE METABOLIC PANEL
ALT: 13 U/L (ref 0–55)
ANION GAP: 6 meq/L (ref 3–11)
AST: 15 U/L (ref 5–34)
Albumin: 3.3 g/dL — ABNORMAL LOW (ref 3.5–5.0)
Alkaline Phosphatase: 55 U/L (ref 40–150)
BILIRUBIN TOTAL: 0.35 mg/dL (ref 0.20–1.20)
BUN: 21.8 mg/dL (ref 7.0–26.0)
CHLORIDE: 108 meq/L (ref 98–109)
CO2: 26 meq/L (ref 22–29)
CREATININE: 0.8 mg/dL (ref 0.6–1.1)
Calcium: 8.9 mg/dL (ref 8.4–10.4)
EGFR: 74 mL/min/{1.73_m2} — ABNORMAL LOW (ref >90)
GLUCOSE: 88 mg/dL (ref 70–140)
Potassium: 4.1 mEq/L (ref 3.5–5.1)
Sodium: 141 mEq/L (ref 136–145)
TOTAL PROTEIN: 7 g/dL (ref 6.4–8.3)

## 2016-01-15 MED ORDER — IOPAMIDOL (ISOVUE-300) INJECTION 61%
100.0000 mL | Freq: Once | INTRAVENOUS | Status: AC | PRN
  Administered 2016-01-15: 100 mL via INTRAVENOUS

## 2016-01-15 NOTE — Progress Notes (Addendum)
1.  Do you need a wheel chair?    no  2. On oxygen? no  3. Have you ever had any surgery in the body part being scanned?  no  4. Have you ever had any surgery on your brain or heart?                           no  5. Have you ever had surgery on your eyes or ears?   no                                          6. Do you have a pacemaker or defibrillator?   no  7. Do you have a Neurostimulator? no  8. Claustrophobic?  no  9. Any risk for metal in eyes?  no  10. Injury by bullet, buckshot, or shrapnel?  no  11. Stent?  no                                                                                                                  12. Hx of Cancer?   Yes, Primary lung with mets to the brain    13. Kidney or Liver disease?  no  14. Hx of Lupus, Rheumatoid Arthritis or Scleroderma?  no  15. IV Antibiotics or long term use of NSAIDS?   no  16. HX of Hypertension?  no  17. Diabetes?  no  18. Allergy to contrast?  no  19. Recent labs.  Labs to be drawn on 6/12 and in EPIC   Completed Whole brain XRT in July 2016   Questions verified with the pt over the phone on 01/15/16  Mont Dutton R.T.(R)(T) Special Procedures Navigator [phone]

## 2016-01-16 ENCOUNTER — Other Ambulatory Visit: Payer: Self-pay | Admitting: Internal Medicine

## 2016-01-18 ENCOUNTER — Encounter: Payer: Self-pay | Admitting: *Deleted

## 2016-01-18 ENCOUNTER — Encounter: Payer: Self-pay | Admitting: Internal Medicine

## 2016-01-18 ENCOUNTER — Telehealth: Payer: Self-pay | Admitting: Internal Medicine

## 2016-01-18 ENCOUNTER — Ambulatory Visit (HOSPITAL_BASED_OUTPATIENT_CLINIC_OR_DEPARTMENT_OTHER): Payer: BC Managed Care – PPO | Admitting: Internal Medicine

## 2016-01-18 VITALS — BP 120/74 | HR 83 | Temp 97.6°F | Resp 17 | Ht 67.0 in | Wt 131.0 lb

## 2016-01-18 DIAGNOSIS — C7951 Secondary malignant neoplasm of bone: Secondary | ICD-10-CM | POA: Diagnosis not present

## 2016-01-18 DIAGNOSIS — C771 Secondary and unspecified malignant neoplasm of intrathoracic lymph nodes: Secondary | ICD-10-CM

## 2016-01-18 DIAGNOSIS — C7931 Secondary malignant neoplasm of brain: Secondary | ICD-10-CM | POA: Diagnosis not present

## 2016-01-18 DIAGNOSIS — Z5111 Encounter for antineoplastic chemotherapy: Secondary | ICD-10-CM

## 2016-01-18 DIAGNOSIS — C3411 Malignant neoplasm of upper lobe, right bronchus or lung: Secondary | ICD-10-CM | POA: Diagnosis not present

## 2016-01-18 DIAGNOSIS — R110 Nausea: Secondary | ICD-10-CM

## 2016-01-18 NOTE — Telephone Encounter (Signed)
Gave and printed appt sched and avs for pt for July  °

## 2016-01-18 NOTE — Progress Notes (Signed)
[place] Telephone:([phone]) [phone]   Fax:(336) [phone]  OFFICE PROGRESS NOTE  [name], MD [address] [place] [address]  DIAGNOSIS: Stage IV (T2a, N3, M1b) Non-small lung cancer, adenocarcinoma with positive EGFR mutation with deletion in exon 19, presented with large right upper lobe lung mass in addition to bilateral mediastinal and left supraclavicular lymphadenopathy as well as multiple bone and brain lesions diagnosed in June 2016  PRIOR THERAPY:  1) Whole brain irradiation under the care of Dr. Lisbeth Renshaw. 2) Gilotrif 40 mg by mouth daily. Status post 5 months of treatment. 3) stereotactic radiotherapy to the enlarging right upper lobe lung mass under the care of Dr. Lisbeth Renshaw  CURRENT THERAPY:  1) Gilotrif 30 mg by mouth daily. First dose started 08/31/2015. Status post 5 months of treatment. 2) Xgeva 120 mg subcutaneously every 4 weeks. First dose 05/01/2015.  INTERVAL HISTORY: Laura Walter 65 y.o. female returns to the clinic today for follow-up visit accompanied by her partner. The patient is feeling fine today with no specific complaints except for right shoulder pain after she was driving a boat for 2 days over the weekend. She continues to have mild fatigue. She denied having any significant chest pain, shortness of breath, cough or hemoptysis. She has no nausea or vomiting. She denied having any significant weight loss or night sweats. She has no fever or chills. She is tolerating her current treatment with Gilotrif fairly well. She had repeat CT scan of the chest, abdomen and pelvis performed recently and she is here for evaluation and discussion of her scan results.  MEDICAL HISTORY: Past Medical History  Diagnosis Date  . Pneumonia 2009 & 2011  . Allergy     seasonal  . H/O reactive hypoglycemia   . Osteopenia   . IBS (irritable bowel syndrome)   . Bronchitis   . S/P radiation therapy completed 02/08/15    whole brain ,spine  . Brain  cancer ([place]) 01/13/15    MRI multiple small brain mets 10 individual lesions  . Bone cancer ([place]) 01/10/15 PET    lytic lesion lt 1st rib,T9,L3 Lt acetabulum  . Lung cancer ([place])     stageIV nscca rul  . Paronychia 11/17/2015  . S/P radiation therapy completed 11/27/15    lung    ALLERGIES:  is allergic to sulfonamide derivatives.  MEDICATIONS:  Current Outpatient Prescriptions  Medication Sig Dispense Refill  . GILOTRIF 30 MG tablet TAKE 1 TABLET BY MOUTH DAILY. TAKE ON AN EMPTY STOMACH 1 HOUR BEFORE OR 2 HOURS AFTER MEALS. 30 tablet 2  . ibuprofen (ADVIL,MOTRIN) 200 MG tablet Take 200 mg by mouth every 6 (six) hours as needed for moderate pain. Reported on 12/19/2015    . loperamide (IMODIUM) 2 MG capsule Take 2 mg by mouth as needed for diarrhea or loose stools (Up to 8 times daily, if needed.). Reported on 12/19/2015    . Nutritional Supplements (FEEDING SUPPLEMENT, OSMOLITE 1.5 CAL,) LIQD Place 237 mLs into feeding tube 4 (four) times daily. (Patient taking differently: Place 237 mLs into feeding tube 2 (two) times daily. ) 958 mL 12  . OLANZapine (ZYPREXA) 10 MG tablet TAKE 1 TABLET (10 MG TOTAL) BY MOUTH AT BEDTIME. (INS PAYS 12/13/2015) 30 tablet 0   No current facility-administered medications for this visit.    SURGICAL HISTORY:  Past Surgical History  Procedure Laterality Date  . Colonoscopy  2009    Negative; [name]  .  Tonsillectomy and adenoidectomy    . Wisdom tooth extraction    . Esophagogastroduodenoscopy (egd) with propofol N/A 04/11/2015    Procedure: ESOPHAGOGASTRODUODENOSCOPY (EGD) WITH PROPOFOL;  Surgeon: Carol Ada, MD;  Location: [facility];  Service: Endoscopy;  Laterality: N/A;  . Peg placement N/A 04/11/2015    Procedure: PERCUTANEOUS ENDOSCOPIC GASTROSTOMY (PEG) PLACEMENT;  Surgeon: Carol Ada, MD;  Location: [facility];  Service: Endoscopy;  Laterality: N/A;    REVIEW OF SYSTEMS:  Constitutional: positive for fatigue Eyes: negative Ears, nose,  mouth, throat, and face: negative Respiratory: positive for pleurisy/chest pain Cardiovascular: negative Gastrointestinal: negative Genitourinary:negative Integument/breast: negative Hematologic/lymphatic: negative Musculoskeletal:positive for muscle weakness Neurological: negative Behavioral/Psych: negative Endocrine: negative Allergic/Immunologic: negative   PHYSICAL EXAMINATION: General appearance: alert, cooperative, fatigued and no distress Head: Normocephalic, without obvious abnormality, Several areas of bruises and ecchymosis on the right side of the face Neck: no adenopathy, no JVD, supple, symmetrical, trachea midline and thyroid not enlarged, symmetric, no tenderness/mass/nodules Lymph nodes: Cervical, supraclavicular, and axillary nodes normal. Resp: clear to auscultation bilaterally Back: Tenderness to palpation at the lower thoracic and lumbar vertebrae. Cardio: regular rate and rhythm, S1, S2 normal, no murmur, click, rub or gallop GI: soft, non-tender; bowel sounds normal; no masses,  no organomegaly Extremities: extremities normal, atraumatic, no cyanosis or edema Neurologic: Alert and oriented X 3, normal strength and tone. Normal symmetric reflexes. Normal coordination and gait    ECOG PERFORMANCE STATUS: 1 - Symptomatic but completely ambulatory  Blood pressure 120/74, pulse 83, temperature 97.6 F (36.4 C), temperature source Oral, resp. rate 17, height _0  (1.702 m), weight 131 lb (59.421 kg), SpO2 99 %.  LABORATORY DATA: Lab Results  Component Value Date   WBC 4.1 01/15/2016   HGB 10.8* 01/15/2016   HCT 33.3* 01/15/2016   MCV 93.3 01/15/2016   PLT 169 01/15/2016      Chemistry      Component Value Date/Time   NA 141 01/15/2016 1157   NA 137 04/12/2015 0835   K 4.1 01/15/2016 1157   K 3.7 04/12/2015 0835   CL 108 04/12/2015 0835   CO2 26 01/15/2016 1157   CO2 22 04/12/2015 0835   BUN 21.8 01/15/2016 1157   BUN 17 04/12/2015 0835   CREATININE  0.8 01/15/2016 1157   CREATININE 0.81 04/12/2015 0835   CREATININE 0.83 11/02/2013 0832      Component Value Date/Time   CALCIUM 8.9 01/15/2016 1157   CALCIUM 8.4* 04/12/2015 0835   ALKPHOS 55 01/15/2016 1157   ALKPHOS 56 02/11/2015 0308   AST 15 01/15/2016 1157   AST 22 02/11/2015 0308   ALT 13 01/15/2016 1157   ALT 21 02/11/2015 0308   BILITOT 0.35 01/15/2016 1157   BILITOT 1.3* 02/11/2015 0308       RADIOGRAPHIC STUDIES: Ct Chest W Contrast  01/15/2016  CLINICAL DATA:  Followup right lung cancer. EXAM: CT CHEST, ABDOMEN, AND PELVIS WITH CONTRAST TECHNIQUE: Multidetector CT imaging of the chest, abdomen and pelvis was performed following the standard protocol during bolus administration of intravenous contrast. CONTRAST:  137m ISOVUE-300 IOPAMIDOL (ISOVUE-300) INJECTION 61% COMPARISON:  10/16/2015. FINDINGS: CT CHEST FINDINGS Mediastinum/Lymph Nodes: No axillary or supraclavicular adenopathy identified. There is no enlarged mediastinal or hilar lymph nodes. Aortic atherosclerosis noted. Calcification within the LAD coronary artery noted. Lungs/Pleura: There is a small right pleural effusion which is new from previous exam. Ground-glass attenuation with more focal consolidation within the subpleural medial right lower lobe is again noted and appears similar to the  previous examination. The dominant right upper lobe lung mass measures 3.5 x 3.2 cm, image 17 of series 2. Previously this measured 3.9 x 3.6 cm. There is a new, adjacent subpleural nodule, slightly more cranial, which measures 2.7 x 2.3 cm, image 10 of series 2. Musculoskeletal: Mixed lytic and sclerotic lesion with pathologic fracture involving the T9 vertebra is again identified and appears similar to previous exam. Stable appearance of sclerotic lesion involving the manubrium. Abnormal sclerosis involving the posterior aspect of the right fifth rib is similar to previous exam. No new or progressive osseous metastatic disease  identified. CT ABDOMEN PELVIS FINDINGS Hepatobiliary: Liver cysts are again identified. No suspicious liver lesions. The gallbladder is normal. No biliary dilatation. Pancreas: No mass, inflammatory changes, or other significant abnormality. Spleen: Within normal limits in size and appearance. Adrenals/Urinary Tract: Normal appearance of the adrenal glands. The kidneys are unremarkable. Normal appearance of the urinary bladder. Stomach/Bowel: Gastrostomy tube is identified within the stomach. No pathologic dilatation of the large or small bowel loops. Vascular/Lymphatic: Normal appearance of the abdominal aorta. No enlarged retroperitoneal or mesenteric adenopathy. No enlarged pelvic or inguinal lymph nodes. Reproductive: Uterus and adnexal structures are unremarkable for patient's age. Other: Small amount of free fluid noted within the pelvis. Musculoskeletal: Chronic left superior and inferior pubic rami identified. Multifocal sclerotic bone metastases are again identified. The appearance is similar to previous exam. No new or progressive disease identified. IMPRESSION: 1. Mixed interval response to therapy. 2. The dominant lesion within the right upper lobe is decreased in size from previous exam. However, there is a new pleural base nodule within the adjacent right upper lobe as well as a new right pleural effusion. Findings are worrisome for pleural spread of tumor. 3. Multifocal bone metastasis which are predominantly sclerotic in appearance. 4. Persistent ground-glass attenuation and airspace consolidation involving the posterior and medial right lower lobe. Electronically Signed   By: Kerby Moors M.D.   On: 01/15/2016 14:41   Ct Abdomen Pelvis W Contrast  01/15/2016  CLINICAL DATA:  Followup right lung cancer. EXAM: CT CHEST, ABDOMEN, AND PELVIS WITH CONTRAST TECHNIQUE: Multidetector CT imaging of the chest, abdomen and pelvis was performed following the standard protocol during bolus administration of  intravenous contrast. CONTRAST:  142m ISOVUE-300 IOPAMIDOL (ISOVUE-300) INJECTION 61% COMPARISON:  10/16/2015. FINDINGS: CT CHEST FINDINGS Mediastinum/Lymph Nodes: No axillary or supraclavicular adenopathy identified. There is no enlarged mediastinal or hilar lymph nodes. Aortic atherosclerosis noted. Calcification within the LAD coronary artery noted. Lungs/Pleura: There is a small right pleural effusion which is new from previous exam. Ground-glass attenuation with more focal consolidation within the subpleural medial right lower lobe is again noted and appears similar to the previous examination. The dominant right upper lobe lung mass measures 3.5 x 3.2 cm, image 17 of series 2. Previously this measured 3.9 x 3.6 cm. There is a new, adjacent subpleural nodule, slightly more cranial, which measures 2.7 x 2.3 cm, image 10 of series 2. Musculoskeletal: Mixed lytic and sclerotic lesion with pathologic fracture involving the T9 vertebra is again identified and appears similar to previous exam. Stable appearance of sclerotic lesion involving the manubrium. Abnormal sclerosis involving the posterior aspect of the right fifth rib is similar to previous exam. No new or progressive osseous metastatic disease identified. CT ABDOMEN PELVIS FINDINGS Hepatobiliary: Liver cysts are again identified. No suspicious liver lesions. The gallbladder is normal. No biliary dilatation. Pancreas: No mass, inflammatory changes, or other significant abnormality. Spleen: Within normal limits in size and  appearance. Adrenals/Urinary Tract: Normal appearance of the adrenal glands. The kidneys are unremarkable. Normal appearance of the urinary bladder. Stomach/Bowel: Gastrostomy tube is identified within the stomach. No pathologic dilatation of the large or small bowel loops. Vascular/Lymphatic: Normal appearance of the abdominal aorta. No enlarged retroperitoneal or mesenteric adenopathy. No enlarged pelvic or inguinal lymph nodes.  Reproductive: Uterus and adnexal structures are unremarkable for patient's age. Other: Small amount of free fluid noted within the pelvis. Musculoskeletal: Chronic left superior and inferior pubic rami identified. Multifocal sclerotic bone metastases are again identified. The appearance is similar to previous exam. No new or progressive disease identified. IMPRESSION: 1. Mixed interval response to therapy. 2. The dominant lesion within the right upper lobe is decreased in size from previous exam. However, there is a new pleural base nodule within the adjacent right upper lobe as well as a new right pleural effusion. Findings are worrisome for pleural spread of tumor. 3. Multifocal bone metastasis which are predominantly sclerotic in appearance. 4. Persistent ground-glass attenuation and airspace consolidation involving the posterior and medial right lower lobe. Electronically Signed   By: Kerby Moors M.D.   On: 01/15/2016 14:41    ASSESSMENT AND PLAN: This is a very pleasant 65 years old never smoker white female recently diagnosed with stage IV non-small cell lung cancer, adenocarcinoma with positive EGFR mutation with deletion in exon 19 presented with large right upper lobe lung mass in addition to bilateral mediastinal, supraclavicular lymphadenopathy as well as bone and multiple brain metastasis diagnosed in June 2016.  The patient completed whole brain irradiation under the care of Dr. Lisbeth Renshaw. She was started on treatment with Gilotrif 40 mg by mouth daily status post 5 months but this was discontinued secondary to intolerance with significant bony contact, skin rash in the sacral area as well as few episodes of diarrhea  The patient was started on Gilotrif 30 mg by mouth daily since 08/31/2015. She is tolerating it much better with the reduced dose was less adverse effects. Unfortunately the recent CT scan of the chest, abdomen and pelvis showed interval increase in the size of the right upper lobe  lung mass compatible with disease progression. She is currently undergoing stereotactic radiotherapy to the enlarging right upper lobe lung mass. Molecular studies from blood test as well as CT-guided core biopsy of the enlarging right upper lobe lung mass showed no evidence for T790M resistant mutation. She is tolerating her treatment with Gilotrif fairly well. Unfortunately the recent CT scan of the chest, abdomen and pelvis showed mixed response with new pleural based nodule adjacent to the previously treated right upper lobe mass. I discussed the scan results and showed the images to the patient and her partner. I recommended for her to see Dr. Lisbeth Renshaw for consideration of radiotherapy to the new pleural based nodule. I recommended for the patient to continue Gilotrif with the same dose for now. If she has any other evidence of disease progression in the future, I may consider the patient for treatment with chemotherapy plus/minus Ketruda. I will see her back for follow-up visit in one month for reevaluation with repeat blood work.  For the metastatic bone disease, I continue the patient on Xgeva 120 mg subcutaneously on monthly basis. The patient was also advised to increased her intake of calcium supplement and vitamin D. For the nausea, she will continue on Zyprexa 10 mg by mouth daily at bedtime.  She was advised to call immediately if she has any concerning symptoms in the  interval. The patient voices understanding of current disease status and treatment options and is in agreement with the current care plan.  All questions were answered. The patient knows to call the clinic with any problems, questions or concerns. We can certainly see the patient much sooner if necessary.  Disclaimer: This note was dictated with voice recognition software. Similar sounding words can inadvertently be transcribed and may not be corrected upon review.  Paronychia

## 2016-01-18 NOTE — Progress Notes (Signed)
Oncology Nurse Navigator Documentation  Oncology Nurse Navigator Flowsheets 01/18/2016  Navigator Location [facility]  Navigator Encounter Type Other  Treatment Phase Treatment  Barriers/Navigation Needs Financial  Interventions Coordination of Care  Acuity Level 2  Acuity Level 2 Other  Time Spent with Patient 98   Patient and partner requested paper work to be completed due to missing a trip.  I completed and reviewed with Dr. Julien Nordmann.  He signed.  I faxed and will return papers to patient today when she comes for a visit.

## 2016-01-19 ENCOUNTER — Encounter: Payer: BC Managed Care – PPO | Admitting: Physical Therapy

## 2016-01-19 ENCOUNTER — Telehealth: Payer: Self-pay | Admitting: *Deleted

## 2016-01-19 NOTE — Telephone Encounter (Signed)
[name] pt's friend called with request for a second opinion referral to [facility].  Pt was seen 6/15 by MD. Returned call to [name] to advise MD out of office until Monday will give him this request. No further concerns.

## 2016-01-22 ENCOUNTER — Other Ambulatory Visit: Payer: Self-pay | Admitting: Internal Medicine

## 2016-01-22 ENCOUNTER — Telehealth: Payer: Self-pay | Admitting: Medical Oncology

## 2016-01-22 DIAGNOSIS — C3411 Malignant neoplasm of upper lobe, right bronchus or lung: Secondary | ICD-10-CM

## 2016-01-22 NOTE — Telephone Encounter (Signed)
I called pt and told her to stay on gilotrif , Mohamed referred her to [facility] and I gave her doctors names and that she was referred to xrt.

## 2016-01-22 NOTE — Telephone Encounter (Signed)
[name] asking if pt is supposed to see Dr Lisbeth Renshaw and or get chemo? Also was a referral sent to [facility]? Note to [place].

## 2016-01-22 NOTE — Telephone Encounter (Signed)
-----   Message from Curt Bears, MD sent at 01/22/2016  3:03 PM EDT ----- Regarding: RE: radiaiton , referral , etc She will need to stay on [place] for now. Referral to Dr. Lisbeth Renshaw was done. Not sure if we discussed referral to [facility] but we be happy to do it if they are interested. Thank you ----- Message -----    From: Ardeen Garland, RN    Sent: 01/22/2016  10:25 AM      To: Curt Bears, MD Subject: Laura Walter , referral , etc                     [name] asking if pt is supposed to see Dr Lisbeth Renshaw and or get chemo? Also was a referral sent to [facility]?

## 2016-01-22 NOTE — Telephone Encounter (Signed)
I told pt to stay on gilotrif. She said she has appt next wed with moody. I also told her Dr Julien Nordmann referred her to Dr Sharlet Salina or [place].

## 2016-01-23 ENCOUNTER — Telehealth: Payer: Self-pay | Admitting: Radiation Oncology

## 2016-01-23 NOTE — Telephone Encounter (Addendum)
I called the pt to confirm that she would like to come for her recon appt after she gets back from the beach. We will see her on 7/3 at 230 pm. Sim will be later that week if she is a candidate for SBRT.

## 2016-01-25 ENCOUNTER — Telehealth: Payer: Self-pay | Admitting: Internal Medicine

## 2016-01-25 NOTE — Telephone Encounter (Signed)
Pt appt with Dr. Sharlet Salina is 01/30/16'@10'$ :44 Dr. Nathanial Millman office will go into [facility] to get records. Pt is aware

## 2016-01-30 NOTE — Addendum Note (Signed)
Encounter addended by: Kyung Rudd, MD on: 01/30/2016  8:13 PM<BR>     Documentation filed: Notes Section, Visit Diagnoses

## 2016-01-30 NOTE — Progress Notes (Signed)
[facility] Simulation and Treatment Planning Note   Name:  Laura Walter   MRN: [id number]   Date: 01/30/2016  DOB: May 12, 1951  Status:outpatient    DIAGNOSIS:    ICD-9-CM ICD-10-CM   1. Malignant neoplasm of upper lobe of right lung (HCC) 162.3 C34.11       CONSENT VERIFIED:yes   SET UP: Patient is setup supine   IMMOBILIZATION: The patient was immobilized using a Vac Loc bag and Abdominal Compression.   NARRATIVE:The patient was brought to the [facility].  Identity was confirmed.  All relevant records and images related to the planned course of therapy were reviewed.  Then, the patient was positioned in a stable reproducible clinical set-up for radiation therapy. Abdominal compression was applied by me.  4D CT images were obtained and reproducible breathing pattern was confirmed. Free breathing CT images were obtained.  Skin markings were placed.  The CT images were loaded into the planning software where the target and avoidance structures were contoured.  The radiation prescription was entered and confirmed.    TREATMENT PLANNING NOTE:  Treatment planning then occurred. I have requested : MLC's, isodose plan, basic dose calculation.  3 dimensional simulation is performed and dose volume histogram of the gross tumor volume, planning tumor volume and criticial normal structures including the spinal cord and lungs were analyzed and requested.  Special treatment procedure was performed due to high dose per fraction.  The patient will be monitored for increased risk of toxicity.  Daily imaging using cone beam CT will be used for target localization.   ------------------------------------------------  Jodelle Gross, MD, PhD

## 2016-01-30 NOTE — Progress Notes (Signed)
Recon  Stage IV Lung Cancer, Right Upper Lung progression  Radiation treatments Right Upper Lung completed 11/24/15,  Metastatic Lung cancer with brain and bony metastases Palliative radiation=01/25/15-02/08/15  Dr. Julien Nordmann last seen 01/18/16=on Gliotrif '40mg'$  daily, Xgeva  '120mg'$  SQ monthly

## 2016-01-31 ENCOUNTER — Ambulatory Visit: Payer: BC Managed Care – PPO | Admitting: Radiation Oncology

## 2016-01-31 ENCOUNTER — Inpatient Hospital Stay
Admission: RE | Admit: 2016-01-31 | Payer: BC Managed Care – PPO | Source: Ambulatory Visit | Admitting: Radiation Oncology

## 2016-01-31 ENCOUNTER — Ambulatory Visit: Payer: Self-pay | Admitting: Radiation Oncology

## 2016-01-31 ENCOUNTER — Ambulatory Visit
Admission: RE | Admit: 2016-01-31 | Discharge: 2016-01-31 | Disposition: A | Discharge status: Home / Self Care | Payer: BC Managed Care – PPO | Source: Ambulatory Visit | Attending: Radiation Oncology | Admitting: Radiation Oncology

## 2016-01-31 ENCOUNTER — Ambulatory Visit: Payer: BC Managed Care – PPO

## 2016-01-31 DIAGNOSIS — C3411 Malignant neoplasm of upper lobe, right bronchus or lung: Secondary | ICD-10-CM | POA: Insufficient documentation

## 2016-01-31 DIAGNOSIS — Z510 Encounter for antineoplastic radiation therapy: Secondary | ICD-10-CM | POA: Insufficient documentation

## 2016-01-31 DIAGNOSIS — Z79899 Other long term (current) drug therapy: Secondary | ICD-10-CM | POA: Insufficient documentation

## 2016-01-31 DIAGNOSIS — C7951 Secondary malignant neoplasm of bone: Secondary | ICD-10-CM | POA: Insufficient documentation

## 2016-01-31 DIAGNOSIS — C7931 Secondary malignant neoplasm of brain: Secondary | ICD-10-CM | POA: Insufficient documentation

## 2016-01-31 MED FILL — *GILOTRIF 30 MG TABLET: 30 | 30 days supply | Qty: 30 | Fill #2

## 2016-02-05 ENCOUNTER — Ambulatory Visit
Admission: RE | Admit: 2016-02-05 | Discharge: 2016-02-05 | Disposition: A | Discharge status: Home / Self Care | Payer: BC Managed Care – PPO | Source: Ambulatory Visit | Attending: Radiation Oncology | Admitting: Radiation Oncology

## 2016-02-05 ENCOUNTER — Encounter: Payer: Self-pay | Admitting: Radiation Oncology

## 2016-02-05 ENCOUNTER — Ambulatory Visit: Payer: BC Managed Care – PPO | Admitting: Radiation Oncology

## 2016-02-05 VITALS — BP 135/65 | HR 86 | Resp 20 | Ht 67.0 in | Wt 135.4 lb

## 2016-02-05 DIAGNOSIS — C3411 Malignant neoplasm of upper lobe, right bronchus or lung: Secondary | ICD-10-CM

## 2016-02-05 DIAGNOSIS — Z79899 Other long term (current) drug therapy: Secondary | ICD-10-CM | POA: Diagnosis not present

## 2016-02-05 DIAGNOSIS — C7931 Secondary malignant neoplasm of brain: Secondary | ICD-10-CM | POA: Diagnosis not present

## 2016-02-05 DIAGNOSIS — Z510 Encounter for antineoplastic radiation therapy: Secondary | ICD-10-CM | POA: Diagnosis present

## 2016-02-05 DIAGNOSIS — C7951 Secondary malignant neoplasm of bone: Secondary | ICD-10-CM

## 2016-02-05 NOTE — Progress Notes (Signed)
All  Radiation Oncology         [phone] ________________________________  Name: Laura Walter MRN: [id number]  Date: 02/05/2016  DOB: 1950-08-30  Re-Consultation Visit Note  CC: [name], MD  Curt Bears, MD  Diagnosis:   Stage IV (T2a, N3, M1b) Non-small lung cancer  Interval Since Last Radiation:  2.5 months.   Completed radiation 11/24/2015 to the right upper lung to 50 Gy in 8 fractions.  Completed radiation 01/25/2015 - 02/08/2015 to the spine with a 3-D conformal technique on our tomotherapy unit and with a course of whole brain radiation treatment, also delivered on our tomotherapy unit in the 3-D conformal technique mode. These treatments were delivered concurrently.  Narrative: Laura Walter is here today for a re-consultation visit because of right upper lung progression. She completed radiation to her right upper lung 11/27/2015. She last saw Dr. Julien Nordmann 01/18/2016 and is currently on Gliotrif 40 mg daily and Xgeva 120 mg SQ monthly. She has a follow up appointment with Dr. Julien Nordmann 02/19/2016. She had a CT of the chest showed a new pleural base nodule within the adjacent right upper lobe as well as a new right pleural effusion. She went to [facility] to get a 2nd opinion to see what clinical trials they are offering. She is here today to discuss radiation treatment options with Dr. Lisbeth Walter and to see if she is a candidate for [facility].   On review of systems, the patient reports that in the last 2 weeks she has noticed a dull  pain in her back between her scapula rating it as a 7/10. 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 She would like to treat the pain with massages to see if that helps the pain. She ambulates slowly and mobility is difficult. She denies any chest pain, shortness of breath, cough, fevers, chills, night sweats, unintended weight changes. She denies any bowel or bladder disturbances, and denies abdominal pain, nausea or vomiting. A  complete review of systems is obtained and is otherwise negative.  Past Medical History:  Past Medical History  Diagnosis Date  . Pneumonia 2009 & 2011  . Allergy     seasonal  . H/O reactive hypoglycemia   . Osteopenia   . IBS (irritable bowel syndrome)   . Bronchitis   . S/P radiation therapy completed 02/08/15    whole brain ,spine  . Brain cancer ([place]) 01/13/15    MRI multiple small brain mets 10 individual lesions  . Bone cancer ([place]) 01/10/15 PET    lytic lesion lt 1st rib,T9,L3 Lt acetabulum  . Lung cancer ([place])     stageIV nscca rul  . Paronychia 11/17/2015  . S/P radiation therapy completed 11/27/15    lung    Past Surgical History: Past Surgical History  Procedure Laterality Date  . Colonoscopy  2009    Negative; [facility]  . Tonsillectomy and adenoidectomy    . Wisdom tooth extraction    . Esophagogastroduodenoscopy (egd) with propofol N/A 04/11/2015    Procedure: ESOPHAGOGASTRODUODENOSCOPY (EGD) WITH PROPOFOL;  Surgeon: Carol Ada, MD;  Location: [facility] ENDOSCOPY;  Service: Endoscopy;  Laterality: N/A;  . Peg placement N/A 04/11/2015    Procedure: PERCUTANEOUS ENDOSCOPIC GASTROSTOMY (PEG) PLACEMENT;  Surgeon: Carol Ada, MD;  Location: [facility]OS[facility];  Service: Endoscopy;  Laterality: N/A;    Social History:  Social History   Social History  . Marital Status: Single    Spouse Name: N/A  .  Number of Children: N/A  . Years of Education: N/A   Occupational History  . Not on file.   Social History Main Topics  . Smoking status: Never Smoker   . Smokeless tobacco: Never Used  . Alcohol Use: Yes     Comment: occasionally rare wine  . Drug Use: No  . Sexual Activity: No   Other Topics Concern  . Not on file   Social History Narrative    Family History: Family History  Problem Relation Age of Onset  . Transient ischemic attack Mother   . Mental illness Mother     Dementia  . Diabetes Father   . Heart attack Father 49  . Stomach cancer Maternal Aunt   .  Heart attack Maternal Aunt     in 42s  . Diabetes Paternal Uncle     X 3  . Heart attack Maternal Grandfather     in 90s     ALLERGIES:  is allergic to sulfonamide derivatives.  Meds: Current Outpatient Prescriptions  Medication Sig Dispense Refill  . GILOTRIF 30 MG tablet TAKE 1 TABLET BY MOUTH DAILY. TAKE ON AN EMPTY STOMACH 1 HOUR BEFORE OR 2 HOURS AFTER MEALS. 30 tablet 2  . ibuprofen (ADVIL,MOTRIN) 200 MG tablet Take 200 mg by mouth every 6 (six) hours as needed for moderate pain. Reported on 12/19/2015    . loperamide (IMODIUM) 2 MG capsule Take 2 mg by mouth as needed for diarrhea or loose stools (Up to 8 times daily, if needed.). Reported on 12/19/2015    . Nutritional Supplements (FEEDING SUPPLEMENT, OSMOLITE 1.5 CAL,) LIQD Place 237 mLs into feeding tube 4 (four) times daily. (Patient taking differently: Place 237 mLs into feeding tube 2 (two) times daily. ) 958 mL 12  . OLANZapine (ZYPREXA) 10 MG tablet TAKE 1 TABLET (10 MG TOTAL) BY MOUTH AT BEDTIME. (INS PAYS 12/13/2015) 30 tablet 0   No current facility-administered medications for this encounter.    Physical Findings:  height is '5\' 7"'$  (1.702 m) and weight is 135 lb 6.4 oz (61.417 kg). Her blood pressure is 135/65 and her pulse is 86. Her respiration is 20 and oxygen saturation is 100%.   In general this is a well appearing Caucasian female in no acute distress. She is alert and oriented x4 and appropriate throughout the examination. HEENT reveals that the patient is normocephalic, atraumatic. EOMs are intact. PERRLA. Skin is intact without any evidence of gross lesions. Cardiovascular exam reveals a regular rate and rhythm, no clicks rubs or murmurs are auscultated. Chest is clear to auscultation bilaterally. Lymphatic assessment is performed and does not reveal any adenopathy in the cervical, supraclavicular, axillary, or inguinal chains. Abdomen has active bowel sounds in all quadrants and is intact. The abdomen is soft, non  tender, non distended. Lower extremities are negative for pretibial pitting edema, deep calf tenderness, cyanosis or clubbing.    Lab Findings: Lab Results  Component Value Date   WBC 4.1 01/15/2016   HGB 10.8* 01/15/2016   HCT 33.3* 01/15/2016   MCV 93.3 01/15/2016   PLT 169 01/15/2016     Radiographic Findings: Ct Chest W Contrast  01/15/2016  CLINICAL DATA:  Followup right lung cancer. EXAM: CT CHEST, ABDOMEN, AND PELVIS WITH CONTRAST TECHNIQUE: Multidetector CT imaging of the chest, abdomen and pelvis was performed following the standard protocol during bolus administration of intravenous contrast. CONTRAST:  142m ISOVUE-300 IOPAMIDOL (ISOVUE-300) INJECTION 61% COMPARISON:  10/16/2015. FINDINGS: CT CHEST FINDINGS Mediastinum/Lymph Nodes: No axillary  or supraclavicular adenopathy identified. There is no enlarged mediastinal or hilar lymph nodes. Aortic atherosclerosis noted. Calcification within the LAD coronary artery noted. Lungs/Pleura: There is a small right pleural effusion which is new from previous exam. Ground-glass attenuation with more focal consolidation within the subpleural medial right lower lobe is again noted and appears similar to the previous examination. The dominant right upper lobe lung mass measures 3.5 x 3.2 cm, image 17 of series 2. Previously this measured 3.9 x 3.6 cm. There is a new, adjacent subpleural nodule, slightly more cranial, which measures 2.7 x 2.3 cm, image 10 of series 2. Musculoskeletal: Mixed lytic and sclerotic lesion with pathologic fracture involving the T9 vertebra is again identified and appears similar to previous exam. Stable appearance of sclerotic lesion involving the manubrium. Abnormal sclerosis involving the posterior aspect of the right fifth rib is similar to previous exam. No new or progressive osseous metastatic disease identified. CT ABDOMEN PELVIS FINDINGS Hepatobiliary: Liver cysts are again identified. No suspicious liver lesions. The  gallbladder is normal. No biliary dilatation. Pancreas: No mass, inflammatory changes, or other significant abnormality. Spleen: Within normal limits in size and appearance. Adrenals/Urinary Tract: Normal appearance of the adrenal glands. The kidneys are unremarkable. Normal appearance of the urinary bladder. Stomach/Bowel: Gastrostomy tube is identified within the stomach. No pathologic dilatation of the large or small bowel loops. Vascular/Lymphatic: Normal appearance of the abdominal aorta. No enlarged retroperitoneal or mesenteric adenopathy. No enlarged pelvic or inguinal lymph nodes. Reproductive: Uterus and adnexal structures are unremarkable for patient's age. Other: Small amount of free fluid noted within the pelvis. Musculoskeletal: Chronic left superior and inferior pubic rami identified. Multifocal sclerotic bone metastases are again identified. The appearance is similar to previous exam. No new or progressive disease identified. IMPRESSION: 1. Mixed interval response to therapy. 2. The dominant lesion within the right upper lobe is decreased in size from previous exam. However, there is a new pleural base nodule within the adjacent right upper lobe as well as a new right pleural effusion. Findings are worrisome for pleural spread of tumor. 3. Multifocal bone metastasis which are predominantly sclerotic in appearance. 4. Persistent ground-glass attenuation and airspace consolidation involving the posterior and medial right lower lobe. Electronically Signed   By: Kerby Moors M.D.   On: 01/15/2016 14:41   Ct Abdomen Pelvis W Contrast  01/15/2016  CLINICAL DATA:  Followup right lung cancer. EXAM: CT CHEST, ABDOMEN, AND PELVIS WITH CONTRAST TECHNIQUE: Multidetector CT imaging of the chest, abdomen and pelvis was performed following the standard protocol during bolus administration of intravenous contrast. CONTRAST:  152m ISOVUE-300 IOPAMIDOL (ISOVUE-300) INJECTION 61% COMPARISON:  10/16/2015.  FINDINGS: CT CHEST FINDINGS Mediastinum/Lymph Nodes: No axillary or supraclavicular adenopathy identified. There is no enlarged mediastinal or hilar lymph nodes. Aortic atherosclerosis noted. Calcification within the LAD coronary artery noted. Lungs/Pleura: There is a small right pleural effusion which is new from previous exam. Ground-glass attenuation with more focal consolidation within the subpleural medial right lower lobe is again noted and appears similar to the previous examination. The dominant right upper lobe lung mass measures 3.5 x 3.2 cm, image 17 of series 2. Previously this measured 3.9 x 3.6 cm. There is a new, adjacent subpleural nodule, slightly more cranial, which measures 2.7 x 2.3 cm, image 10 of series 2. Musculoskeletal: Mixed lytic and sclerotic lesion with pathologic fracture involving the T9 vertebra is again identified and appears similar to previous exam. Stable appearance of sclerotic lesion involving the manubrium. Abnormal sclerosis involving  the posterior aspect of the right fifth rib is similar to previous exam. No new or progressive osseous metastatic disease identified. CT ABDOMEN PELVIS FINDINGS Hepatobiliary: Liver cysts are again identified. No suspicious liver lesions. The gallbladder is normal. No biliary dilatation. Pancreas: No mass, inflammatory changes, or other significant abnormality. Spleen: Within normal limits in size and appearance. Adrenals/Urinary Tract: Normal appearance of the adrenal glands. The kidneys are unremarkable. Normal appearance of the urinary bladder. Stomach/Bowel: Gastrostomy tube is identified within the stomach. No pathologic dilatation of the large or small bowel loops. Vascular/Lymphatic: Normal appearance of the abdominal aorta. No enlarged retroperitoneal or mesenteric adenopathy. No enlarged pelvic or inguinal lymph nodes. Reproductive: Uterus and adnexal structures are unremarkable for patient's age. Other: Small amount of free fluid noted  within the pelvis. Musculoskeletal: Chronic left superior and inferior pubic rami identified. Multifocal sclerotic bone metastases are again identified. The appearance is similar to previous exam. No new or progressive disease identified. IMPRESSION: 1. Mixed interval response to therapy. 2. The dominant lesion within the right upper lobe is decreased in size from previous exam. However, there is a new pleural base nodule within the adjacent right upper lobe as well as a new right pleural effusion. Findings are worrisome for pleural spread of tumor. 3. Multifocal bone metastasis which are predominantly sclerotic in appearance. 4. Persistent ground-glass attenuation and airspace consolidation involving the posterior and medial right lower lobe. Electronically Signed   By: Kerby Moors M.D.   On: 01/15/2016 14:41    Impression: The patient is diagnosed with Stage IV (T2a, N3, M1b) Non-small lung cancer with a new pleural nodule and a history of lytic lesion in the T9 vertebral body.  Plan:  Dr. Lisbeth Walter disucsses the findings, and echos Dr. Worthy Flank concerns for a new site of disease. We discussed that the new nodule in her lung overlaps the area that was treated 2 months ago, and because of the proximity to the previously treated tumor, she would not be a candidate for SBRT. Rather, she would be a candidate for palliative radiation with 30 Gy in 10 treatments to both the right pleural based nodule at the T9 vertebra. She has a CT simulation scheduled 02/07/2016. This procedure has been fully reviewed with the patient, we discussed the risks, benefits, short, and long term effects of therapy, and written informed consent has been obtained.   The location of her pain does not seem to be consistent with her T9 lesion, and our suspicion is that this is a muscle strain. Dr. Lisbeth Walter has provided written consent for the patient to have a gentle massage for her upper back.  The above documentation reflects my direct  findings during this shared patient visit. Please see the separate note by Dr. Lisbeth Walter on this date for the remainder of the patient's plan of care.    Carola Rhine, PAC    This document serves as a record of services personally performed by Kyung Rudd, MD. It was created on his behalf by Lendon Collar, a trained medical scribe. The creation of this record is based on the scribe's personal observations and the provider's statements to them. This document has been checked and approved by the attending provider.

## 2016-02-05 NOTE — Progress Notes (Signed)
Please see the Nurse Progress Note in the MD Initial Consult Encounter for this patient. 

## 2016-02-05 NOTE — Progress Notes (Signed)
Recon  Stage IV Lung Cancer, Right Upper Lung progression  Radiation treatments Right Upper Lung completed 11/24/15,  Metastatic Lung cancer with brain and bony metastases Palliative radiation=01/25/15-02/08/15  Dr. Julien Nordmann last seen 01/18/16=on Gliotrif '40mg'$  daily, Xgeva  '120mg'$  SQ monthly, follow up 02/19/16 SBRT CT Simulation 02/07/16 at 1000am  Mri Brain 7/077/17 SRS Protocol  Went to [facility] 2nd opinion,  C/o BACK PAIN SHOULDERS, 7/10 PAIN SCALE , ambulation really slow, mobility  Difficulty,  BP 135/65 mmHg  Pulse 86  Resp 20  Ht '5\' 7"'$  (1.702 m)  Wt 135 lb 6.4 oz (61.417 kg)  BMI 21.20 kg/m2  SpO2 100%  Wt Readings from Last 3 Encounters:  02/05/16 135 lb 6.4 oz (61.417 kg)  01/18/16 131 lb (59.421 kg)  01/03/16 135 lb 12.8 oz (61.598 kg)

## 2016-02-07 ENCOUNTER — Ambulatory Visit
Admission: RE | Admit: 2016-02-07 | Discharge: 2016-02-07 | Disposition: A | Discharge status: Home / Self Care | Payer: BC Managed Care – PPO | Source: Ambulatory Visit | Attending: Radiation Oncology | Admitting: Radiation Oncology

## 2016-02-07 ENCOUNTER — Ambulatory Visit: Payer: BC Managed Care – PPO | Admitting: Radiation Oncology

## 2016-02-07 DIAGNOSIS — C3411 Malignant neoplasm of upper lobe, right bronchus or lung: Secondary | ICD-10-CM

## 2016-02-07 DIAGNOSIS — C7951 Secondary malignant neoplasm of bone: Secondary | ICD-10-CM

## 2016-02-09 ENCOUNTER — Ambulatory Visit
Admission: RE | Admit: 2016-02-09 | Discharge: 2016-02-09 | Disposition: A | Discharge status: Home / Self Care | Payer: BC Managed Care – PPO | Source: Ambulatory Visit | Attending: Radiation Oncology | Admitting: Radiation Oncology

## 2016-02-09 DIAGNOSIS — C7949 Secondary malignant neoplasm of other parts of nervous system: Principal | ICD-10-CM

## 2016-02-09 DIAGNOSIS — C7931 Secondary malignant neoplasm of brain: Secondary | ICD-10-CM

## 2016-02-09 MED ORDER — GADOBENATE DIMEGLUMINE 529 MG/ML IV SOLN
12.0000 mL | Freq: Once | INTRAVENOUS | Status: AC | PRN
  Administered 2016-02-09: 12 mL via INTRAVENOUS

## 2016-02-12 ENCOUNTER — Ambulatory Visit: Payer: BC Managed Care – PPO | Admitting: Radiation Oncology

## 2016-02-13 ENCOUNTER — Other Ambulatory Visit: Payer: Self-pay | Admitting: Internal Medicine

## 2016-02-13 DIAGNOSIS — C3411 Malignant neoplasm of upper lobe, right bronchus or lung: Secondary | ICD-10-CM | POA: Diagnosis not present

## 2016-02-14 ENCOUNTER — Telehealth: Payer: Self-pay | Admitting: *Deleted

## 2016-02-14 ENCOUNTER — Encounter: Payer: Self-pay | Admitting: Radiation Oncology

## 2016-02-14 ENCOUNTER — Encounter: Payer: Self-pay | Admitting: *Deleted

## 2016-02-14 ENCOUNTER — Ambulatory Visit
Admission: RE | Admit: 2016-02-14 | Discharge: 2016-02-14 | Disposition: A | Discharge status: Home / Self Care | Payer: BC Managed Care – PPO | Source: Ambulatory Visit | Attending: Radiation Oncology | Admitting: Radiation Oncology

## 2016-02-14 VITALS — BP 142/69 | HR 78 | Resp 16

## 2016-02-14 DIAGNOSIS — C7931 Secondary malignant neoplasm of brain: Secondary | ICD-10-CM | POA: Insufficient documentation

## 2016-02-14 DIAGNOSIS — C3411 Malignant neoplasm of upper lobe, right bronchus or lung: Secondary | ICD-10-CM

## 2016-02-14 DIAGNOSIS — C7951 Secondary malignant neoplasm of bone: Secondary | ICD-10-CM

## 2016-02-14 MED ORDER — HYDROCODONE-ACETAMINOPHEN 5-325 MG PO TABS: 1.0000 | ORAL_TABLET | ORAL | Status: DC | PRN

## 2016-02-14 NOTE — Progress Notes (Signed)
Oncology Nurse Navigator Documentation  Oncology Nurse Navigator Flowsheets 02/14/2016  Navigator Encounter Type Clinic/MDC  Patient Visit Type RadOnc  Interventions Other  Acuity Level 2  Acuity Level 2 Other  Time Spent with Patient 51   Spoke with patient and partner today.  They gave me information on a clinical trial.  I updated them I will update Dr. Julien Nordmann.    I offered support and listened to them express concerns about disease.

## 2016-02-14 NOTE — Progress Notes (Signed)
Radiation Oncology         (336) [phone] ________________________________  Name: KIMLEY APSEY MRN: [id number]  Date: 02/14/2016  DOB: [birth year]  Follow-Up Visit Note  CC: [name], MD  Curt Bears, MD  Diagnosis:   Metastatic Lung cancer to brain and thoracic spine.    Interval Since Last Radiation: 2.5 months.   Completed radiation 11/24/2015 to the right upper lung to 50 Gy in 8 fractions.   Completed radiation 01/25/2015 - 02/08/2015 to the spine with a 3-D conformal technique on our tomotherapy unit and with a course of whole brain radiation treatment, also delivered on our tomotherapy unit in the 3-D conformal technique mode. These treatments were delivered concurrently.   Narrative: Ms. Sinning is here today for a re-consultation visit because of right upper lung progression. She completed radiation to her right upper lung 11/27/2015. She last saw Dr. Julien Nordmann 01/18/2016 and is currently on Gliotrif 40 mg daily and Xgeva 120 mg SQ monthly. She has a follow up appointment with Dr. Julien Nordmann 02/19/2016. She had a CT of the chest showed a new pleural base nodule within the adjacent right upper lobe as well as a new right pleural effusion. She was initially considering SRS to this site, though her previous treatment fields overlapped this area, so she was offered palliative radiotherapy to T9. She has gone through the simulation process and will begin this treatment tomorrow.   Her most recent surveillance MRI scan on 02/09/16 shows a new area of disease in the brain as well. She comes today to discuss these results which revealed a new 5.5 mm lesion in the frontal lobe of the brain on the left.                               On review of systems, the patient states she continues to have pain in the upper back and that this seems to have gotten worse since she was seen last week. She denies any neuropathic pain or numbness in her extremities or in her chest wall. She denies any bowel or  bladder disturbances or lack of control. She is not experiencing nausea, vomiting, chest pain, or shortness of breath. No other complaints are verbalized.  Past Medical History:  Past Medical History  Diagnosis Date  . Pneumonia 2009 & 2011  . Allergy     seasonal  . H/O reactive hypoglycemia   . Osteopenia   . IBS (irritable bowel syndrome)   . Bronchitis   . S/P radiation therapy completed 02/08/15    whole brain ,spine  . Brain cancer ([place]) 01/13/15    MRI multiple small brain mets 10 individual lesions  . Bone cancer ([place]) 01/10/15 PET    lytic lesion lt 1st rib,T9,L3 Lt acetabulum  . Lung cancer ([place])     stageIV nscca rul  . Paronychia 11/17/2015  . S/P radiation therapy completed 11/27/15    lung    Past Surgical History: Past Surgical History  Procedure Laterality Date  . Colonoscopy  2009    Negative; [facility]  . Tonsillectomy and adenoidectomy    . Wisdom tooth extraction    . Esophagogastroduodenoscopy (egd) with propofol N/A 04/11/2015    Procedure: ESOPHAGOGASTRODUODENOSCOPY (EGD) WITH PROPOFOL;  Surgeon: Carol Ada, MD;  Location: [facility];  Service: Endoscopy;  Laterality: N/A;  . Peg placement N/A 04/11/2015    Procedure: PERCUTANEOUS ENDOSCOPIC GASTROSTOMY (PEG) PLACEMENT;  Surgeon: Saralyn Pilar  Benson Norway, MD;  Location: [facility];  Service: Endoscopy;  Laterality: N/A;    Social History:  Social History   Social History  . Marital Status: Single    Spouse Name: N/A  . Number of Children: N/A  . Years of Education: N/A   Occupational History  . Not on file.   Social History Main Topics  . Smoking status: Never Smoker   . Smokeless tobacco: Never Used  . Alcohol Use: Yes     Comment: occasionally rare wine  . Drug Use: No  . Sexual Activity: No   Other Topics Concern  . Not on file   Social History Narrative    Family History: Family History  Problem Relation Age of Onset  . Transient ischemic attack Mother   . Mental illness Mother      Dementia  . Diabetes Father   . Heart attack Father 71  . Stomach cancer Maternal Aunt   . Heart attack Maternal Aunt     in 9s  . Diabetes Paternal Uncle     X 3  . Heart attack Maternal Grandfather     in 53s    ALLERGIES:  is allergic to sulfonamide derivatives.  Meds: Current Outpatient Prescriptions  Medication Sig Dispense Refill  . GILOTRIF 30 MG tablet TAKE 1 TABLET BY MOUTH DAILY. TAKE ON AN EMPTY STOMACH 1 HOUR BEFORE OR 2 HOURS AFTER MEALS. 30 tablet 2  . ibuprofen (ADVIL,MOTRIN) 200 MG tablet Take 200 mg by mouth every 6 (six) hours as needed for moderate pain. Reported on 12/19/2015    . loperamide (IMODIUM) 2 MG capsule Take 2 mg by mouth as needed for diarrhea or loose stools (Up to 8 times daily, if needed.). Reported on 12/19/2015    . Nutritional Supplements (FEEDING SUPPLEMENT, OSMOLITE 1.5 CAL,) LIQD Place 237 mLs into feeding tube 4 (four) times daily. (Patient taking differently: Place 237 mLs into feeding tube 2 (two) times daily. ) 958 mL 12  . OLANZapine (ZYPREXA) 10 MG tablet TAKE 1 TABLET (10 MG TOTAL) BY MOUTH AT BEDTIME. (INS PAYS 12/13/2015) 30 tablet 0  . HYDROcodone-acetaminophen (NORCO) 5-325 MG tablet Take 1-2 tablets by mouth every 4 (four) hours as needed for moderate pain. 60 tablet 0   No current facility-administered medications for this encounter.    Physical Findings:.  blood pressure is 142/69 and her pulse is 78. Her respiration is 16 and oxygen saturation is 100%.  In general this is a chronically ill appearing caucasian female in no acute distress. She's alert and oriented x4 and appropriate throughout the examination. Cardiopulmonary assessment is negative for acute distress and she exhibits normal effort.    Lab Findings: Lab Results  Component Value Date   WBC 4.1 01/15/2016   HGB 10.8* 01/15/2016   HCT 33.3* 01/15/2016   MCV 93.3 01/15/2016   PLT 169 01/15/2016     Radiographic Findings: Mr Jeri Cos DG Contrast  02/09/2016   CLINICAL DATA:  Metastatic non-small cell lung cancer. Whole-brain radiation 02/09/2015. EXAM: MRI HEAD WITHOUT AND WITH CONTRAST TECHNIQUE: Multiplanar, multiecho pulse sequences of the brain and surrounding structures were obtained without and with intravenous contrast. CONTRAST:  71m MULTIHANCE GADOBENATE DIMEGLUMINE 529 MG/ML IV SOLN COMPARISON:  MRI 10/27/2015, 03/29/2015 FINDINGS: New enhancing metastatic deposit in the left frontal lobe over the convexity measuring 5.5 mm. This has a small amount of hemorrhage and increased surrounding vasogenic edema. This is not seen on the prior study. No other new enhancing lesions identified.  Small enhancing lesion right insular cortex continues to removed and is minimally identified on the coronal postcontrast images. Progression of diffuse white matter hyperintensity compatible with whole-brain radiation. Ventricle size is normal.  No shift of the midline structures. Negative for acute infarct. No lesions in the skull base or calvarium. IMPRESSION: 5.3 mm metastatic deposit left frontal lobe over the convexity is new. There is vasogenic edema and a small amount of hemorrhage in the lesion. Right insular metastatic deposit no longer visualized. Right insular enhancing lesion continues to improved. No other new lesions. Progression of diffuse white matter signal changes due whole-brain radiation. Electronically Signed   By: Franchot Gallo M.D.   On: 02/09/2016 15:08    Impression/Plan: 1. Stage IV, T2a, N3, M1b NSCLC of the right upper lobe with disease in the thoracic spine and brain. The patient is in the process of starting her palliative radiotherapy to the thoracic spine. Her MRI on 02/09/16 was reviewed, and we would recommend proceeding with [facility] radiotherapy in 1 fraction. She is counseled on the difference of treatment style from the whole brain radiotherapy and we reviewed the risks, benefits, and short and long term effects of therapy.  She is interested in  moving forward with this. She is in te process of seeing if she is a candidate for a clinical trial.  2.  Upper back pain. The patients pain has worsened since her last visit in the upper back despite massage. Her friend has questioned if she could have another imaging study, and I will discuss this further with Dr. Lisbeth Renshaw. I have given the patient a prescription for Norco 5/325 to be taken 1-2 tablets po q 4 hours prn pain. She will keep [place] informed of her progress with this medication.     Carola Rhine, PAC

## 2016-02-14 NOTE — Telephone Encounter (Signed)
Oncology Nurse Navigator Documentation  Oncology Nurse Navigator Flowsheets 02/14/2016  Navigator Encounter Type Telephone  Telephone Outgoing Call  Treatment Phase Pre-Tx/Tx Discussion  Barriers/Navigation Needs (No Data)  Interventions Other  Acuity Level 1  Acuity Level 1 Minimal follow up required  Time Spent with Patient 15   I called Laura Walter to check up on her after she saw Rad Onc today.  Laura Walter answered the phone.  I asked how they were doing.  I listened as she explained.  I asked if they needed anything and was told not at this time.  I offered support and encouragement for them both.

## 2016-02-14 NOTE — Addendum Note (Signed)
Encounter addended by: Benn Moulder, RN on: 02/14/2016  6:39 PM<BR>     Documentation filed: Charges VN

## 2016-02-14 NOTE — Progress Notes (Signed)
Patient brought to nursing after port films,  MRI brain 02/09/16, c/o shoulder and top of back pain 4/10, no nausea,  1:00 PM BP 142/69 mmHg  Pulse 78  Temp(Src)   Resp 16  SpO2 100%  Wt Readings from Last 3 Encounters:  02/05/16 135 lb 6.4 oz (61.417 kg)  01/18/16 131 lb (59.421 kg)  01/03/16 135 lb 12.8 oz (61.598 kg)

## 2016-02-15 ENCOUNTER — Ambulatory Visit
Admission: RE | Admit: 2016-02-15 | Discharge: 2016-02-15 | Disposition: A | Discharge status: Home / Self Care | Payer: BC Managed Care – PPO | Source: Ambulatory Visit | Attending: Radiation Oncology | Admitting: Radiation Oncology

## 2016-02-15 DIAGNOSIS — C3411 Malignant neoplasm of upper lobe, right bronchus or lung: Secondary | ICD-10-CM | POA: Diagnosis not present

## 2016-02-15 DIAGNOSIS — C7931 Secondary malignant neoplasm of brain: Secondary | ICD-10-CM

## 2016-02-15 NOTE — Progress Notes (Signed)
Does patient have an allergy to IV contrast dye?: No.   Has patient ever received premedication for IV contrast dye?: No.   Does patient take metformin?: No.  If patient does take metformin when was the last dose N/A  Date of lab work: 01/15/16 BUN: 21.8 CR:0.8  IV site: RAC , x 1,  22g 1 incch cather,excellent  ,  Blood return, accessed by Guadelupe Sabin, RN      BP 128/70 mmHg  Pulse 77  Temp(Src) 97.4 F (36.3 C) (Oral)  Resp 16  Wt 134 lb 6.4 oz (60.963 kg)  SpO2 97%

## 2016-02-16 ENCOUNTER — Ambulatory Visit
Admission: RE | Admit: 2016-02-16 | Discharge: 2016-02-16 | Disposition: A | Discharge status: Home / Self Care | Payer: BC Managed Care – PPO | Source: Ambulatory Visit | Attending: Radiation Oncology | Admitting: Radiation Oncology

## 2016-02-16 ENCOUNTER — Encounter: Payer: Self-pay | Admitting: Radiation Oncology

## 2016-02-16 VITALS — BP 116/73 | HR 76 | Temp 97.7°F | Resp 16 | Wt 134.0 lb

## 2016-02-16 DIAGNOSIS — C3411 Malignant neoplasm of upper lobe, right bronchus or lung: Secondary | ICD-10-CM | POA: Diagnosis not present

## 2016-02-16 NOTE — Progress Notes (Signed)
Weekly rad txs rt upper lobe T-8-T10, took advil this am, had a massae this am, pain,  2/10 at present, very weak,  Appetite good, gave sonafine cream to be applied to both sites daily and prn No nausea,  11:22 AM BP 116/73 mmHg  Pulse 76  Temp(Src) 97.7 F (36.5 C) (Oral)  Resp 16  Wt 134 lb (60.782 kg)  Wt Readings from Last 3 Encounters:  02/16/16 134 lb (60.782 kg)  02/15/16 134 lb 6.4 oz (60.963 kg)  02/05/16 135 lb 6.4 oz (61.417 kg)

## 2016-02-17 NOTE — Progress Notes (Signed)
   Department of Radiation Oncology  Phone:  [phone] Fax:        [phone]  Weekly Treatment Note    Name: Laura Walter Date: 02/17/2016 MRN: [id number] DOB: 04-Dec-1950   Diagnosis:     ICD-9-CM ICD-10-CM   1. 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 Wound Dressings (SONAFINE EX) Apply 1 application topically daily.    Marland Kitchen GILOTRIF 30 MG tablet TAKE 1 TABLET BY MOUTH DAILY. TAKE ON AN EMPTY STOMACH 1 HOUR BEFORE OR 2 HOURS AFTER MEALS. 30 tablet 2  . HYDROcodone-acetaminophen (NORCO) 5-325 MG tablet Take 1-2 tablets by mouth every 4 (four) hours as needed for moderate pain. 60 tablet 0  . ibuprofen (ADVIL,MOTRIN) 200 MG tablet Take 200 mg by mouth every 6 (six) hours as needed for moderate pain. Reported on 12/19/2015    . loperamide (IMODIUM) 2 MG capsule Take 2 mg by mouth as needed for diarrhea or loose stools (Up to 8 times daily, if needed.). Reported on 12/19/2015    . Nutritional Supplements (FEEDING SUPPLEMENT, OSMOLITE 1.5 CAL,) LIQD Place 237 mLs into feeding tube 4 (four) times daily. (Patient taking differently: Place 237 mLs into feeding tube 2 (two) times daily. ) 958 mL 12  . OLANZapine (ZYPREXA) 10 MG tablet TAKE 1 TABLET (10 MG TOTAL) BY MOUTH AT BEDTIME. (INS PAYS 12/13/2015) 30 tablet 0   No current facility-administered medications for this encounter.     ALLERGIES: Sulfonamide derivatives   LABORATORY DATA:  Lab Results  Component Value Date   WBC 4.1 01/15/2016   HGB 10.8* 01/15/2016   HCT 33.3* 01/15/2016   MCV 93.3 01/15/2016   PLT 169 01/15/2016   Lab Results  Component Value Date   NA 141 01/15/2016   K 4.1 01/15/2016   CL 108 04/12/2015   CO2 26 01/15/2016   Lab Results  Component Value Date   ALT 13 01/15/2016   AST 15 01/15/2016   ALKPHOS 55 01/15/2016   BILITOT 0.35  01/15/2016     NARRATIVE: Laura Walter was seen today for weekly treatment management. The chart was checked and the patient's films were reviewed.  Weekly rad txs rt upper lobe T-8-T10, took advil this am, had a massae this am, pain,  2/10 at present, very weak,  Appetite good, gave sonafine cream to be applied to both sites daily and prn No nausea,  8:40 PM BP 116/73 mmHg  Pulse 76  Temp(Src) 97.7 F (36.5 C) (Oral)  Resp 16  Wt 134 lb (60.782 kg)  Wt Readings from Last 3 Encounters:  02/16/16 134 lb (60.782 kg)  02/15/16 134 lb 6.4 oz (60.963 kg)  02/05/16 135 lb 6.4 oz (61.417 kg)    PHYSICAL EXAMINATION: weight is 134 lb (60.782 kg). Her oral temperature is 97.7 F (36.5 C). Her blood pressure is 116/73 and her pulse is 76. Her respiration is 16.        ASSESSMENT: The patient is doing satisfactorily with treatment.  PLAN: We will continue with the patient's radiation treatment as planned.

## 2016-02-19 ENCOUNTER — Encounter: Payer: Self-pay | Admitting: *Deleted

## 2016-02-19 ENCOUNTER — Ambulatory Visit (HOSPITAL_BASED_OUTPATIENT_CLINIC_OR_DEPARTMENT_OTHER): Payer: BC Managed Care – PPO | Admitting: Internal Medicine

## 2016-02-19 ENCOUNTER — Other Ambulatory Visit (HOSPITAL_BASED_OUTPATIENT_CLINIC_OR_DEPARTMENT_OTHER): Payer: BC Managed Care – PPO

## 2016-02-19 ENCOUNTER — Ambulatory Visit
Admission: RE | Admit: 2016-02-19 | Discharge: 2016-02-19 | Disposition: A | Discharge status: Home / Self Care | Payer: BC Managed Care – PPO | Source: Ambulatory Visit | Attending: Radiation Oncology | Admitting: Radiation Oncology

## 2016-02-19 ENCOUNTER — Encounter: Payer: Self-pay | Admitting: Internal Medicine

## 2016-02-19 VITALS — BP 141/79 | HR 87 | Temp 97.0°F | Resp 18 | Ht 67.0 in | Wt 132.2 lb

## 2016-02-19 DIAGNOSIS — C7951 Secondary malignant neoplasm of bone: Secondary | ICD-10-CM

## 2016-02-19 DIAGNOSIS — C771 Secondary and unspecified malignant neoplasm of intrathoracic lymph nodes: Secondary | ICD-10-CM | POA: Diagnosis not present

## 2016-02-19 DIAGNOSIS — C3411 Malignant neoplasm of upper lobe, right bronchus or lung: Secondary | ICD-10-CM

## 2016-02-19 DIAGNOSIS — Z5111 Encounter for antineoplastic chemotherapy: Secondary | ICD-10-CM

## 2016-02-19 DIAGNOSIS — D6481 Anemia due to antineoplastic chemotherapy: Secondary | ICD-10-CM

## 2016-02-19 DIAGNOSIS — C7931 Secondary malignant neoplasm of brain: Secondary | ICD-10-CM | POA: Diagnosis not present

## 2016-02-19 DIAGNOSIS — G893 Neoplasm related pain (acute) (chronic): Secondary | ICD-10-CM

## 2016-02-19 DIAGNOSIS — R110 Nausea: Secondary | ICD-10-CM

## 2016-02-19 LAB — COMPREHENSIVE METABOLIC PANEL
ALBUMIN: 2.8 g/dL — AB (ref 3.5–5.0)
ALK PHOS: 60 U/L (ref 40–150)
ALT: 15 U/L (ref 0–55)
AST: 19 U/L (ref 5–34)
Anion Gap: 9 mEq/L (ref 3–11)
BUN: 25.1 mg/dL (ref 7.0–26.0)
CO2: 24 mEq/L (ref 22–29)
CREATININE: 0.8 mg/dL (ref 0.6–1.1)
Calcium: 8.4 mg/dL (ref 8.4–10.4)
Chloride: 111 mEq/L — ABNORMAL HIGH (ref 98–109)
EGFR: 81 mL/min/{1.73_m2} — ABNORMAL LOW (ref >90)
GLUCOSE: 93 mg/dL (ref 70–140)
POTASSIUM: 3.7 meq/L (ref 3.5–5.1)
SODIUM: 144 meq/L (ref 136–145)
Total Bilirubin: <0.3 mg/dL (ref 0.20–1.20)
Total Protein: 6.6 g/dL (ref 6.4–8.3)

## 2016-02-19 LAB — CBC WITH DIFFERENTIAL/PLATELET
BASO%: 0.6 % (ref 0.0–2.0)
BASOS ABS: 0 10*3/uL (ref 0.0–0.1)
EOS%: 6.5 % (ref 0.0–7.0)
Eosinophils Absolute: 0.3 10*3/uL (ref 0.0–0.5)
HEMATOCRIT: 29.2 % — AB (ref 34.8–46.6)
HEMOGLOBIN: 9.5 g/dL — AB (ref 11.6–15.9)
LYMPH#: 0.3 10*3/uL — AB (ref 0.9–3.3)
LYMPH%: 6.3 % — ABNORMAL LOW (ref 14.0–49.7)
MCH: 29.4 pg (ref 25.1–34.0)
MCHC: 32.6 g/dL (ref 31.5–36.0)
MCV: 90.1 fL (ref 79.5–101.0)
MONO#: 0.5 10*3/uL (ref 0.1–0.9)
MONO%: 9.5 % (ref 0.0–14.0)
NEUT%: 77.1 % — ABNORMAL HIGH (ref 38.4–76.8)
NEUTROS ABS: 3.7 10*3/uL (ref 1.5–6.5)
Platelets: 180 10*3/uL (ref 145–400)
RBC: 3.24 10*6/uL — ABNORMAL LOW (ref 3.70–5.45)
RDW: 16.3 % — AB (ref 11.2–14.5)
WBC: 4.8 10*3/uL (ref 3.9–10.3)

## 2016-02-19 MED ORDER — INTEGRA PLUS PO CAPS: 1.0000 | ORAL_CAPSULE | Freq: Every morning | ORAL | Status: DC

## 2016-02-19 MED ORDER — TRAMADOL HCL 50 MG PO TABS: 50.0000 mg | ORAL_TABLET | Freq: Four times a day (QID) | ORAL | Status: DC | PRN

## 2016-02-19 NOTE — Progress Notes (Signed)
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 Lisbeth Renshaw. 2) Gilotrif 40 mg by mouth daily. Status post 5 months of treatment. 3) stereotactic radiotherapy to the enlarging right upper lobe lung mass under the care of Dr. Lisbeth Renshaw. 4) stereotactic radiotherapy to another right upper lobe lung mass as well as solitary brain metastases.  CURRENT THERAPY:  1) Gilotrif 30 mg by mouth daily. First dose started 08/31/2015. Status post 5 months of treatment. 2) Xgeva 120 mg subcutaneously every 4 weeks. First dose 05/01/2015.  INTERVAL HISTORY: Laura Walter 65 y.o. female returns to the clinic today for follow-up visit accompanied by her partner. The patient is feeling fine today with no specific complaints except for right shoulder pain and fatigue. She was referred to Dr. Durenda Hurt at [facility] for second opinion and he recommended for her to continue with palliative radiotherapy. He also discussed with the patient enrollment in a clinical trial with chemotherapy plus/minus immunotherapy. She is currently undergoing the palliative radiotherapy to the enlarging right upper lobe lung mass as well as the solitary brain metastasis. She has right shoulder pain but she could not tolerate hydrocodone. She is requesting a different pain medication. She denied having any significant chest pain, shortness of breath, cough or hemoptysis. She has no nausea or vomiting. She denied having any significant weight loss or  night sweats. She has no fever or chills. She is tolerating her current treatment with Gilotrif fairly well. She is here today for evaluation and discussion of her treatment options.   MEDICAL HISTORY: Past Medical History  Diagnosis Date  . Pneumonia 2009 & 2011  . Allergy     seasonal  . H/O reactive hypoglycemia   . Osteopenia   . IBS (irritable bowel syndrome)   . Bronchitis   . S/P radiation therapy completed 02/08/15    whole brain ,spine  . Brain cancer ([place]) 01/13/15    MRI multiple small brain mets 10 individual lesions  . Bone cancer ([place]) 01/10/15 PET    lytic lesion lt 1st rib,T9,L3 Lt acetabulum  . Lung cancer ([place])     stageIV nscca rul  . Paronychia 11/17/2015  . S/P radiation therapy completed 11/27/15    lung    ALLERGIES:  is allergic to sulfonamide derivatives.  MEDICATIONS:  Current Outpatient Prescriptions  Medication Sig Dispense Refill  . GILOTRIF 30 MG tablet TAKE 1 TABLET BY MOUTH DAILY. TAKE ON AN EMPTY STOMACH 1 HOUR BEFORE OR 2 HOURS AFTER MEALS. 30 tablet 2  . HYDROcodone-acetaminophen (NORCO) 5-325 MG tablet Take 1-2 tablets by mouth every 4 (four) hours as needed for moderate pain. 60 tablet 0  . ibuprofen (ADVIL,MOTRIN) 200 MG tablet Take 200 mg by mouth every 6 (six) hours as needed for moderate pain. Reported on 12/19/2015    . loperamide (IMODIUM) 2 MG capsule Take 2 mg by mouth as needed for diarrhea or loose stools (Up to 8 times daily, if needed.). Reported on 12/19/2015    . Nutritional Supplements (FEEDING SUPPLEMENT,  OSMOLITE 1.5 CAL,) LIQD Place 237 mLs into feeding tube 4 (four) times daily. (Patient taking differently: Place 237 mLs into feeding tube 2 (two) times daily. ) 958 mL 12  . OLANZapine (ZYPREXA) 10 MG tablet TAKE 1 TABLET (10 MG TOTAL) BY MOUTH AT BEDTIME. (INS PAYS 12/13/2015) 30 tablet 0  . Wound Dressings (SONAFINE EX) Apply 1 application topically daily.     No current facility-administered medications for this visit.     SURGICAL HISTORY:  Past Surgical History  Procedure Laterality Date  . Colonoscopy  2009    Negative; [facility]  . Tonsillectomy and adenoidectomy    . Wisdom tooth extraction    . Esophagogastroduodenoscopy (egd) with propofol N/A 04/11/2015    Procedure: ESOPHAGOGASTRODUODENOSCOPY (EGD) WITH PROPOFOL;  Surgeon: Carol Ada, MD;  Location: [facility] END[facility]COPY;  Service: Endoscopy;  Laterality: N/A;  . Peg placement N/A 04/11/2015    Procedure: PERCUTANEOUS ENDOSCOPIC GASTROSTOMY (PEG) PLACEMENT;  Surgeon: Carol Ada, MD;  Location: [facility] END[facility];  Service: Endoscopy;  Laterality: N/A;    REVIEW OF SYSTEMS:  Constitutional: positive for fatigue Eyes: negative Ears, nose, mouth, throat, and face: negative Respiratory: negative Cardiovascular: negative Gastrointestinal: negative Genitourinary:negative Integument/breast: negative Hematologic/lymphatic: negative Musculoskeletal:positive for bone pain and muscle weakness Neurological: negative Behavioral/Psych: negative Endocrine: negative Allergic/Immunologic: negative   PHYSICAL EXAMINATION: General appearance: alert, cooperative, fatigued and no distress Head: Normocephalic, without obvious abnormality, Several areas of bruises and ecchymosis on the right side of the face Neck: no adenopathy, no JVD, supple, symmetrical, trachea midline and thyroid not enlarged, symmetric, no tenderness/mass/nodules Lymph nodes: Cervical, supraclavicular, and axillary nodes normal. Resp: clear to auscultation bilaterally Back: Tenderness to palpation at the lower thoracic and lumbar vertebrae. Cardio: regular rate and rhythm, S1, S2 normal, no murmur, click, rub or gallop GI: soft, non-tender; bowel sounds normal; no masses,  no organomegaly Extremities: extremities normal, atraumatic, no cyanosis or edema Neurologic: Alert and oriented X 3, normal strength and tone. Normal symmetric reflexes. Normal coordination and gait    ECOG PERFORMANCE  STATUS: 1 - Symptomatic but completely ambulatory  Blood pressure 141/79, pulse 87, temperature 97 F (36.1 C), temperature source Oral, resp. rate 18, height 5' 7"  (1.702 m), weight 132 lb 3.2 oz (59.966 kg), SpO2 100 %.  LABORATORY DATA: Lab Results  Component Value Date   WBC 4.8 02/19/2016   HGB 9.5* 02/19/2016   HCT 29.2* 02/19/2016   MCV 90.1 02/19/2016   PLT 180 02/19/2016      Chemistry      Component Value Date/Time   NA 141 01/15/2016 1157   NA 137 04/12/2015 0835   K 4.1 01/15/2016 1157   K 3.7 04/12/2015 0835   CL 108 04/12/2015 0835   CO2 26 01/15/2016 1157   CO2 22 04/12/2015 0835   BUN 21.8 01/15/2016 1157   BUN 17 04/12/2015 0835   CREATININE 0.8 01/15/2016 1157   CREATININE 0.81 04/12/2015 0835   CREATININE 0.83 11/02/2013 0832      Component Value Date/Time   CALCIUM 8.9 01/15/2016 1157   CALCIUM 8.4* 04/12/2015 0835   ALKPHOS 55 01/15/2016 1157   ALKPHOS 56 02/11/2015 0308   AST 15 01/15/2016 1157   AST 22 02/11/2015 0308   ALT 13 01/15/2016 1157   ALT 21 02/11/2015 0308   BILITOT 0.35 01/15/2016 1157   BILITOT 1.3* 02/11/2015 0308       RADIOGRAPHIC STUDIES: Mr Jeri Cos Wo Contrast  24-Feb-2016  CLINICAL DATA:  Metastatic non-small cell lung cancer. Whole-brain  radiation 02/09/2015. EXAM: MRI HEAD WITHOUT AND WITH CONTRAST TECHNIQUE: Multiplanar, multiecho pulse sequences of the brain and surrounding structures were obtained without and with intravenous contrast. CONTRAST:  24m MULTIHANCE GADOBENATE DIMEGLUMINE 529 MG/ML IV SOLN COMPARISON:  MRI 10/27/2015, 03/29/2015 FINDINGS: New enhancing metastatic deposit in the left frontal lobe over the convexity measuring 5.5 mm. This has a small amount of hemorrhage and increased surrounding vasogenic edema. This is not seen on the prior study. No other new enhancing lesions identified. Small enhancing lesion right insular cortex continues to removed and is minimally identified on the coronal postcontrast  images. Progression of diffuse white matter hyperintensity compatible with whole-brain radiation. Ventricle size is normal.  No shift of the midline structures. Negative for acute infarct. No lesions in the skull base or calvarium. IMPRESSION: 5.3 mm metastatic deposit left frontal lobe over the convexity is new. There is vasogenic edema and a small amount of hemorrhage in the lesion. Right insular metastatic deposit no longer visualized. Right insular enhancing lesion continues to improved. No other new lesions. Progression of diffuse white matter signal changes due whole-brain radiation. Electronically Signed   By: CFranchot GalloM.D.   On: 02/09/2016 15:08    ASSESSMENT AND PLAN: This is a very pleasant 65years old never smoker white female recently diagnosed with stage IV non-small cell lung cancer, adenocarcinoma with positive EGFR mutation with deletion in exon 19 presented with large right upper lobe lung mass in addition to bilateral mediastinal, supraclavicular lymphadenopathy as well as bone and multiple brain metastasis diagnosed in June 2016.  The patient completed whole brain irradiation under the care of Dr. MLisbeth Renshaw She was started on treatment with Gilotrif 40 mg by mouth daily status post 5 months but this was discontinued secondary to intolerance with significant bony contact, skin rash in the sacral area as well as few episodes of diarrhea  The patient was started on Gilotrif 30 mg by mouth daily since 08/31/2015. She is tolerating it much better with the reduced dose was less adverse effects. Unfortunately the recent CT scan of the chest, abdomen and pelvis showed interval increase in the size of the right upper lobe lung mass compatible with disease progression. She is currently undergoing stereotactic radiotherapy to the enlarging right upper lobe lung mass. Molecular studies from blood test as well as CT-guided core biopsy of the enlarging right upper lobe lung mass showed no evidence  for T790M resistant mutation. She is tolerating her treatment with Gilotrif fairly well. Unfortunately the recent CT scan of the chest, abdomen and pelvis showed mixed response with new pleural based nodule adjacent to the previously treated right upper lobe mass. She is currently undergoing palliative radiotherapy to the new pleural-based nodule under the care of Dr. MLisbeth Renshaw She is also undergoing stereotactic radiotherapy to new solitary brain metastasis. I recommended for the patient to continue Gilotrif with the same dose for now. If she has any other evidence of disease progression in the future, I may consider the patient for treatment with chemotherapy plus/minus Ketruda. [place] [facility] I will see her back for follow-up visit in one month for reevaluation with repeat blood work.  For the metastatic bone disease, I continue the patient on Xgeva 120 mg subcutaneously on monthly basis. The patient was also advised to increased her intake of calcium supplement and vitamin D. For the nausea, she will continue on Zyprexa 10  mg by mouth daily at bedtime.  For pain management, she will discontinue hydrocodone and I started the patient on tramadol 50 mg by mouth every 6 hours as needed for pain. For the anemia, I started the patient on Integra +1 capsule by mouth daily. She was advised to call immediately if she has any concerning symptoms in the interval. The patient voices understanding of current disease status and treatment options and is in agreement with the current care plan.  All questions were answered. The patient knows to call the clinic with any problems, questions or concerns. We can certainly see the patient much sooner if necessary.  Disclaimer: This note was dictated with voice recognition software. Similar sounding words can inadvertently be transcribed  and may not be corrected upon review.  Paronychia

## 2016-02-19 NOTE — Progress Notes (Signed)
Oncology Nurse Navigator Documentation  Oncology Nurse Navigator Flowsheets 02/19/2016  Navigator Location [facility]-Med Onc  Navigator Encounter Type Clinic/MDC  Patient Visit Type MedOnc  Treatment Phase Treatment  Barriers/Navigation Needs Coordination of Care  Interventions Coordination of Care  Acuity Level 1  Acuity Level 1 Minimal follow up required  Time Spent with Patient 15

## 2016-02-20 ENCOUNTER — Ambulatory Visit
Admission: RE | Admit: 2016-02-20 | Discharge: 2016-02-20 | Disposition: A | Discharge status: Home / Self Care | Payer: BC Managed Care – PPO | Source: Ambulatory Visit | Attending: Radiation Oncology | Admitting: Radiation Oncology

## 2016-02-20 DIAGNOSIS — C3411 Malignant neoplasm of upper lobe, right bronchus or lung: Secondary | ICD-10-CM | POA: Diagnosis not present

## 2016-02-21 ENCOUNTER — Telehealth: Payer: Self-pay | Admitting: Internal Medicine

## 2016-02-21 ENCOUNTER — Ambulatory Visit
Admission: RE | Admit: 2016-02-21 | Discharge: 2016-02-21 | Disposition: A | Discharge status: Home / Self Care | Payer: BC Managed Care – PPO | Source: Ambulatory Visit | Attending: Radiation Oncology | Admitting: Radiation Oncology

## 2016-02-21 DIAGNOSIS — C3411 Malignant neoplasm of upper lobe, right bronchus or lung: Secondary | ICD-10-CM | POA: Diagnosis not present

## 2016-02-21 NOTE — Telephone Encounter (Signed)
Spoke with patient. Appointment confirmed for 03/19/16. Merleen Nicely.

## 2016-02-22 ENCOUNTER — Encounter: Payer: Self-pay | Admitting: *Deleted

## 2016-02-22 ENCOUNTER — Other Ambulatory Visit: Payer: Self-pay | Admitting: Internal Medicine

## 2016-02-22 ENCOUNTER — Telehealth: Payer: Self-pay | Admitting: Radiation Therapy

## 2016-02-22 ENCOUNTER — Other Ambulatory Visit: Payer: Self-pay | Admitting: Radiation Oncology

## 2016-02-22 ENCOUNTER — Ambulatory Visit
Admission: RE | Admit: 2016-02-22 | Discharge: 2016-02-22 | Disposition: A | Discharge status: Home / Self Care | Payer: BC Managed Care – PPO | Source: Ambulatory Visit | Attending: Radiation Oncology | Admitting: Radiation Oncology

## 2016-02-22 ENCOUNTER — Ambulatory Visit: Payer: BC Managed Care – PPO

## 2016-02-22 DIAGNOSIS — C3411 Malignant neoplasm of upper lobe, right bronchus or lung: Secondary | ICD-10-CM | POA: Diagnosis not present

## 2016-02-22 MED ORDER — DEXAMETHASONE 4 MG PO TABS: 4.0000 mg | ORAL_TABLET | Freq: Four times a day (QID) | ORAL | Status: DC

## 2016-02-22 NOTE — Telephone Encounter (Addendum)
Laura Walter returned my previous call to inform them of the finding on Laura Walter's Cervical MRI done earlier today. 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 Laura Walter said that there is no compression on her spinal cord and no surgical intervention necessary at this time, but he has instructed that she start a steroid RX of 4 mg every 6 hours. Laura Walter has called that into her pharmacy and it is ready for Laura Walter to pick up and begin this evening.   Laura Walter expressed understanding of this information and appreciation for the call. I let her know that Laura Walter and Laura Walter will both see her tomorrow to review the MRI in detail and that Laura Walter can also speak with her about next steps for a course of radiation to treat Laura Walter's cervical spine.    Mont Dutton R.T.(R)(T) Special Procedures Navigator  [phone]

## 2016-02-22 NOTE — Progress Notes (Signed)
Pt was a "No Show" for injection appoitment

## 2016-02-22 NOTE — Progress Notes (Signed)
Oncology Nurse Navigator Documentation  Oncology Nurse Navigator Flowsheets 02/22/2016  Navigator Encounter Type Clinic/MDC  Patient Visit Type RadOnc  Treatment Phase Treatment  Barriers/Navigation Needs Coordination of Care  Interventions Coordination of Care  Acuity Level 2  Acuity Level 2 Assistance expediting appointments  Time Spent with Patient 30   I spoke with Laura Walter yesterday at [facility] after her radiation treatment.  She was questioning Xgeva treatment.  I followed up with Dr. Julien Nordmann.  He stated he would like her to have her ordered Xgeva.  I placed urgent POF for this to be scheduled.

## 2016-02-23 ENCOUNTER — Ambulatory Visit
Admission: RE | Admit: 2016-02-23 | Discharge: 2016-02-23 | Disposition: A | Discharge status: Home / Self Care | Payer: BC Managed Care – PPO | Source: Ambulatory Visit | Attending: Radiation Oncology | Admitting: Radiation Oncology

## 2016-02-23 DIAGNOSIS — C7931 Secondary malignant neoplasm of brain: Secondary | ICD-10-CM

## 2016-02-23 DIAGNOSIS — C3411 Malignant neoplasm of upper lobe, right bronchus or lung: Secondary | ICD-10-CM

## 2016-02-23 DIAGNOSIS — C7951 Secondary malignant neoplasm of bone: Secondary | ICD-10-CM

## 2016-02-23 NOTE — Op Note (Signed)
  Name: DEXTER SAUSER  MRN: [id number]  Date: 02/23/2016   DOB: [birth year]  Stereotactic Radiosurgery Operative Note  PRE-OPERATIVE DIAGNOSIS:  Solitary Brain Metastasis  POST-OPERATIVE DIAGNOSIS:  Solitary Brain Metastasis  PROCEDURE:  Stereotactic Radiosurgery  SURGEON:  Charlie Pitter, MD  NARRATIVE: The patient underwent a radiation treatment planning session in the radiation oncology simulation suite under the care of the radiation oncology physician and physicist.  I participated closely in the radiation treatment planning afterwards. The patient underwent planning CT which was fused to 3T high resolution MRI with 1 mm axial slices.  These images were fused on the planning system.  We contoured the gross target volumes and subsequently expanded this to yield the Planning Target Volume. I actively participated in the planning process.  I helped to define and review the target contours and also the contours of the optic pathway, eyes, brainstem and selected nearby organs at risk.  All the dose constraints for critical structures were reviewed and compared to AAPM Task Group 101.  The prescription dose conformity was reviewed.  I approved the plan electronically.    Accordingly, [name] was brought to the TrueBeam stereotactic radiation treatment linac and placed in the custom immobilization mask.  The patient was aligned according to the IR fiducial markers with BrainLab Exactrac, then orthogonal x-rays were used in ExacTrac with the 6DOF robotic table and the shifts were made to align the patient  [name] received stereotactic radiosurgery uneventfully.    The detailed description of the procedure is recorded in the radiation oncology procedure note.  I was present for the duration of the procedure.  DISPOSITION:  Following delivery, the patient was transported to nursing in stable condition and monitored for possible acute effects to be discharged to home in stable condition with  follow-up in one month.  Charlie Pitter, MD 02/23/2016 1:15 PM

## 2016-02-23 NOTE — Progress Notes (Signed)
Patient seen in the back on treatment table lianc 1 by MD  Not sent to nursing for assessment 1:11 PM

## 2016-02-24 NOTE — Progress Notes (Signed)
  Radiation Oncology         [phone] ________________________________  Name: Laura Walter MRN: [id number]  Date: 02/23/2016  DOB: 1950/10/03   SPECIAL TREATMENT PROCEDURE   3D TREATMENT PLANNING AND DOSIMETRY: The patient's radiation plan was reviewed and approved by Dr. Trenton Gammon from neurosurgery and radiation oncology prior to treatment. It showed 3-dimensional radiation distributions overlaid onto the planning CT/MRI image set. The [facility] for the target structures as well as the organs at risk were reviewed. The documentation of the 3D plan and dosimetry are filed in the radiation oncology EMR.   NARRATIVE: The patient was brought to the TrueBeam stereotactic radiation treatment machine and placed supine on the CT couch. The head frame was applied, and the patient was set up for stereotactic radiosurgery. Neurosurgery was present for the set-up and delivery   SIMULATION VERIFICATION: In the couch zero-angle position, the patient underwent Exactrac imaging using the Brainlab system with orthogonal KV images. These were carefully aligned and repeated to confirm treatment position for each of the isocenters. The Exactrac snap film verification was repeated at each couch angle.   SPECIAL TREATMENT PROCEDURE: The patient received stereotactic radiosurgery to the following target:  PTV1 Left Frontal Lobe 5.9m target was treated using 2 Arcs to a prescription dose of 20 Gy. ExacTrac Snap verification was performed for each couch angle.   STEREOTACTIC TREATMENT MANAGEMENT: Following delivery, the patient was transported to nursing in stable condition and monitored for possible acute effects. Vital signs were recorded . The patient tolerated treatment without significant acute effects, and was discharged to home in stable condition.  PLAN: The patient had a recent CT scan showing significant disease in the C6-7 levels. We have discussed palliative radiation treatment with her today regarding this and  will proceed with this..Marland Kitchen  ------------------------------------------------  JJodelle Gross MD, PhD

## 2016-02-24 NOTE — Progress Notes (Signed)
  Radiation Oncology         [phone] ________________________________  Name: RETHER RISON MRN: [id number]  Date: 02/15/2016  DOB: 1950/11/21  DIAGNOSIS:     ICD-9-CM ICD-10-CM   1. Brain metastases ([place]) 198.3 C79.31     NARRATIVE:  The patient was brought to the [facility].  Identity was confirmed.  All relevant records and images related to the planned course of therapy were reviewed.  The patient freely provided informed written consent to proceed with treatment after reviewing the details related to the planned course of therapy. The consent form was witnessed and verified by the simulation staff. Intravenous access was established for contrast administration. Then, the patient was set-up in a stable reproducible supine position for radiation therapy.  A relocatable thermoplastic stereotactic head frame was fabricated for precise immobilization.  CT images were obtained.  Surface markings were placed.  The CT images were loaded into the planning software and fused with the patient's targeting MRI scan.  Then the target and avoidance structures were contoured.  Treatment planning then occurred.  The radiation prescription was entered and confirmed.  I have requested 3D planning  I have requested a DVH of the following structures: Brain stem, brain, left eye, right eye, lenses, optic chiasm, target volumes, uninvolved brain, and normal tissue.    SPECIAL TREATMENT PROCEDURE:  The planned course of therapy using radiation constitutes a special treatment procedure. Special care is required in the management of this patient for the following reasons. This treatment constitutes a Special Treatment Procedure for the following reason: High dose per fraction requiring special monitoring for increased toxicities of treatment including daily imaging.  The special nature of the planned course of radiotherapy will require increased physician supervision and oversight to ensure patient's  safety with optimal treatment outcomes.  PLAN:  The patient will receive 20 Gy in 1 fraction.   ------------------------------------------------  Jodelle Gross, MD, PhD

## 2016-02-24 NOTE — Progress Notes (Signed)
   Department of Radiation Oncology  Phone:  [phone] Fax:        [phone]  Weekly Treatment Note    Name: Laura Walter Date: 02/24/2016 MRN: [id number] DOB: 20-Oct-1950   Diagnosis:     ICD-9-CM ICD-10-CM   1. 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 dexamethasone (DECADRON) 4 MG tablet Take 1 tablet (4 mg total) by mouth 4 (four) times daily. 120 tablet 0  . FeFum-FePoly-FA-B Cmp-C-Biot (INTEGRA PLUS) CAPS Take 1 capsule by mouth every morning. 30 capsule 2  . GILOTRIF 30 MG tablet TAKE 1 TABLET BY MOUTH DAILY. TAKE ON AN EMPTY STOMACH 1 HOUR BEFORE OR 2 HOURS AFTER MEALS. 30 tablet 2  . HYDROcodone-acetaminophen (NORCO) 5-325 MG tablet Take 1-2 tablets by mouth every 4 (four) hours as needed for moderate pain. 60 tablet 0  . ibuprofen (ADVIL,MOTRIN) 200 MG tablet Take 200 mg by mouth every 6 (six) hours as needed for moderate pain. Reported on 12/19/2015    . loperamide (IMODIUM) 2 MG capsule Take 2 mg by mouth as needed for diarrhea or loose stools (Up to 8 times daily, if needed.). Reported on 12/19/2015    . Nutritional Supplements (FEEDING SUPPLEMENT, OSMOLITE 1.5 CAL,) LIQD Place 237 mLs into feeding tube 4 (four) times daily. (Patient taking differently: Place 237 mLs into feeding tube 2 (two) times daily. ) 958 mL 12  . OLANZapine (ZYPREXA) 10 MG tablet TAKE 1 TABLET (10 MG TOTAL) BY MOUTH AT BEDTIME. (INS PAYS 12/13/2015) 30 tablet 0  . traMADol (ULTRAM) 50 MG tablet Take 1 tablet (50 mg total) by mouth every 6 (six) hours as needed. 60 tablet 0  . Wound Dressings (SONAFINE EX) Apply 1 application topically daily.     No current facility-administered medications for this encounter.     ALLERGIES: Sulfonamide derivatives   LABORATORY DATA:  Lab Results  Component Value Date   WBC 4.8 02/19/2016   HGB  9.5* 02/19/2016   HCT 29.2* 02/19/2016   MCV 90.1 02/19/2016   PLT 180 02/19/2016   Lab Results  Component Value Date   NA 144 02/19/2016   K 3.7 02/19/2016   CL 108 04/12/2015   CO2 24 02/19/2016   Lab Results  Component Value Date   ALT 15 02/19/2016   AST 19 02/19/2016   ALKPHOS 60 02/19/2016   BILITOT <0.30 02/19/2016     NARRATIVE: Laura Walter was seen today for weekly treatment management. The chart was checked and the patient's films were reviewed.  The patient is doing satisfactorily with her ongoing palliative radiation treatment to the right lung and spine. No significant change in shortness of breath. No significant esophagitis. The patient is proceeding with radiosurgery today to a recent finding of a solitary brain metastasis.  PHYSICAL EXAMINATION  alert, no acute distress  ASSESSMENT: The patient is doing satisfactorily with treatment.  PLAN: We will continue with the patient's radiation treatment as planned.

## 2016-02-26 ENCOUNTER — Ambulatory Visit
Admission: RE | Admit: 2016-02-26 | Discharge: 2016-02-26 | Disposition: A | Discharge status: Home / Self Care | Payer: BC Managed Care – PPO | Source: Ambulatory Visit | Attending: Radiation Oncology | Admitting: Radiation Oncology

## 2016-02-26 DIAGNOSIS — C3411 Malignant neoplasm of upper lobe, right bronchus or lung: Secondary | ICD-10-CM | POA: Diagnosis not present

## 2016-02-27 ENCOUNTER — Encounter: Payer: Self-pay | Admitting: *Deleted

## 2016-02-27 ENCOUNTER — Ambulatory Visit
Admission: RE | Admit: 2016-02-27 | Discharge: 2016-02-27 | Disposition: A | Discharge status: Home / Self Care | Payer: BC Managed Care – PPO | Source: Ambulatory Visit | Attending: Radiation Oncology | Admitting: Radiation Oncology

## 2016-02-27 ENCOUNTER — Encounter: Payer: Self-pay | Admitting: Genetic Counselor

## 2016-02-27 DIAGNOSIS — C3411 Malignant neoplasm of upper lobe, right bronchus or lung: Secondary | ICD-10-CM | POA: Diagnosis not present

## 2016-02-28 ENCOUNTER — Ambulatory Visit: Payer: BC Managed Care – PPO

## 2016-02-28 ENCOUNTER — Ambulatory Visit
Admission: RE | Admit: 2016-02-28 | Discharge: 2016-02-28 | Disposition: A | Discharge status: Home / Self Care | Payer: BC Managed Care – PPO | Source: Ambulatory Visit | Attending: Radiation Oncology | Admitting: Radiation Oncology

## 2016-02-28 DIAGNOSIS — C3411 Malignant neoplasm of upper lobe, right bronchus or lung: Secondary | ICD-10-CM | POA: Diagnosis not present

## 2016-02-29 ENCOUNTER — Ambulatory Visit
Admission: RE | Admit: 2016-02-29 | Discharge: 2016-02-29 | Disposition: A | Discharge status: Home / Self Care | Payer: BC Managed Care – PPO | Source: Ambulatory Visit | Attending: Radiation Oncology | Admitting: Radiation Oncology

## 2016-02-29 DIAGNOSIS — C3411 Malignant neoplasm of upper lobe, right bronchus or lung: Secondary | ICD-10-CM | POA: Diagnosis not present

## 2016-03-01 ENCOUNTER — Ambulatory Visit: Payer: BC Managed Care – PPO

## 2016-03-01 ENCOUNTER — Ambulatory Visit
Admission: RE | Admit: 2016-03-01 | Discharge: 2016-03-01 | Disposition: A | Discharge status: Home / Self Care | Payer: BC Managed Care – PPO | Source: Ambulatory Visit | Attending: Radiation Oncology | Admitting: Radiation Oncology

## 2016-03-01 VITALS — BP 118/69 | HR 84 | Temp 97.9°F | Ht 67.0 in | Wt 133.8 lb

## 2016-03-01 DIAGNOSIS — C3411 Malignant neoplasm of upper lobe, right bronchus or lung: Secondary | ICD-10-CM

## 2016-03-01 DIAGNOSIS — C7951 Secondary malignant neoplasm of bone: Secondary | ICD-10-CM

## 2016-03-01 NOTE — Progress Notes (Signed)
Department of Radiation Oncology  Phone:  [phone] Fax:        [phone]  Weekly Treatment Note    Name: Laura Walter Date: 03/01/2016 MRN: [id number] DOB: 07-Sep-1950   Diagnosis:     ICD-9-CM ICD-10-CM   1. Malignant neoplasm of upper lobe of right lung (HCC) 162.3 C34.11   2. Bone metastases (HCC) 198.5 C79.51      Current dose: 30 Gy  Current fraction: 12   MEDICATIONS: Current Outpatient Prescriptions  Medication Sig Dispense Refill  . dexamethasone (DECADRON) 4 MG tablet Take 1 tablet (4 mg total) by mouth 4 (four) times daily. 120 tablet 0  . FeFum-FePoly-FA-B Cmp-C-Biot (INTEGRA PLUS) CAPS Take 1 capsule by mouth every morning. 30 capsule 2  . GILOTRIF 30 MG tablet TAKE 1 TABLET BY MOUTH DAILY. TAKE ON AN EMPTY STOMACH 1 HOUR BEFORE OR 2 HOURS AFTER MEALS. 30 tablet 2  . HYDROcodone-acetaminophen (NORCO) 5-325 MG tablet Take 1-2 tablets by mouth every 4 (four) hours as needed for moderate pain. 60 tablet 0  . ibuprofen (ADVIL,MOTRIN) 200 MG tablet Take 200 mg by mouth every 6 (six) hours as needed for moderate pain. Reported on 12/19/2015    . Nutritional Supplements (FEEDING SUPPLEMENT, OSMOLITE 1.5 CAL,) LIQD Place 237 mLs into feeding tube 4 (four) times daily. (Patient taking differently: Place 237 mLs into feeding tube 2 (two) times daily. ) 958 mL 12  . OLANZapine (ZYPREXA) 10 MG tablet TAKE 1 TABLET (10 MG TOTAL) BY MOUTH AT BEDTIME. (INS PAYS 12/13/2015) 30 tablet 0  . loperamide (IMODIUM) 2 MG capsule Take 2 mg by mouth as needed for diarrhea or loose stools (Up to 8 times daily, if needed.). Reported on 12/19/2015    . traMADol (ULTRAM) 50 MG tablet Take 1 tablet (50 mg total) by mouth every 6 (six) hours as needed. (Patient not taking: Reported on 03/01/2016) 60 tablet 0  . Wound Dressings (SONAFINE EX) Apply 1 application topically daily.     No current facility-administered medications for this encounter.      ALLERGIES: Sulfonamide  derivatives   LABORATORY DATA:  Lab Results  Component Value Date   WBC 4.8 02/19/2016   HGB 9.5 (L) 02/19/2016   HCT 29.2 (L) 02/19/2016   MCV 90.1 02/19/2016   PLT 180 02/19/2016   Lab Results  Component Value Date   NA 144 02/19/2016   K 3.7 02/19/2016   CL 108 04/12/2015   CO2 24 02/19/2016   Lab Results  Component Value Date   ALT 15 02/19/2016   AST 19 02/19/2016   ALKPHOS 60 02/19/2016   BILITOT <0.30 02/19/2016     NARRATIVE: Laura Walter was seen today for weekly treatment management. The chart was checked and the patient's films were reviewed.  Laura Walter has completed 12 fractions to her right upper lobe and t8-t10.  She reports having pain at a 3/10 in her bilateral posterior upper arms.  She is taking Norco 3-4 tablets daily.  She is taking decadron 4 times daily.  She reports food is getting stuck in her throat.  She does have a feeding tube and has been taking in 2 cans of osmolite per day.  She denies having shortness of breath and coughs "very little."  She reports feeling dizzy but says it is from her medicine.  She is taking Gilotrif.  She reports having trouble with movement in her left hand and fingers.  She says this has been happening for about  a week and Dr. Trenton Gammon is aware.  She denies having any skin irritation.   Patient's sister reports that there is some pain extending from the top of her spine radiates to both of her arms. She also describes that the patient's pain is worse in the arms.  The patient is also unable to make tight fists.    PHYSICAL EXAMINATION: Alert, no acute distress. Motor strength is 5-/-5 with some grip weakness in the left  ASSESSMENT: The patient is doing satisfactorily with treatment.  PLAN: We will continue with the patient's radiation treatment as planned. The patient completes treatment on Monday and will see Dr. Lisbeth Renshaw in one month.        This document serves as a record of services personally performed by Tyler Pita, MD. It was created on his behalf by Truddie Hidden, a trained medical scribe. The creation of this record is based on the scribe's personal observations and the provider's statements to them. This document has been checked and approved by the attending provider.

## 2016-03-01 NOTE — Progress Notes (Signed)
Laura Walter has completed 12 fractions to her right upper lobe and t8-t10.  She reports having pain at a 3/10 in her bil posterior upper arms.  She is taking Norco 3-4 tablets daily.  She is taking decadron 4 times daily.  She reports food is getting stuck in her throat.  She does have a feeding tube and has been taking in 2 cans of osmolite per day.  She denies having shortness of breath and coughs "very little."  She reports feeling dizzy but says it is from her medicine.  She is taking Gilotrif.  She reports having trouble with movement in her left hand and fingers.  She says this has been happening for about a week and Dr. Trenton Gammon is aware.  She denies having any skin irritation.    BP 118/69 (BP Location: Left Arm, Patient Position: Sitting)   Pulse 84   Temp 97.9 F (36.6 C) (Axillary)   Ht '5\' 7"'$  (1.702 m)   Wt 133 lb 12.8 oz (60.7 kg)   SpO2 100%   BMI 20.96 kg/m    Wt Readings from Last 3 Encounters:  03/01/16 133 lb 12.8 oz (60.7 kg)  02/19/16 132 lb 3.2 oz (60 kg)  02/16/16 134 lb (60.8 kg)

## 2016-03-04 ENCOUNTER — Encounter: Payer: Self-pay | Admitting: Radiation Oncology

## 2016-03-04 ENCOUNTER — Ambulatory Visit
Admission: RE | Admit: 2016-03-04 | Discharge: 2016-03-04 | Disposition: A | Discharge status: Home / Self Care | Payer: BC Managed Care – PPO | Source: Ambulatory Visit | Attending: Radiation Oncology | Admitting: Radiation Oncology

## 2016-03-04 VITALS — BP 108/62 | HR 79 | Ht 67.0 in | Wt 130.7 lb

## 2016-03-04 DIAGNOSIS — C7951 Secondary malignant neoplasm of bone: Secondary | ICD-10-CM

## 2016-03-04 DIAGNOSIS — C3411 Malignant neoplasm of upper lobe, right bronchus or lung: Secondary | ICD-10-CM

## 2016-03-04 NOTE — Progress Notes (Signed)
Department of Radiation Oncology  Phone:  [phone] Fax:        [phone]  Weekly Treatment Note    Name: Laura Walter Date: 03/04/2016 MRN: [id number] DOB: Oct 09, 1950   Diagnosis:     ICD-9-CM ICD-10-CM   1. Bone metastases ([facility]) 198.5 C79.51   2. Malignant neoplasm of upper lobe of right lung ([facility]CC) 162.3 C34.11      Current dose: 30 Gy  Current fraction: 12   MEDICATIONS: Current Outpatient Prescriptions  Medication Sig Dispense Refill  . dexamethasone (DECADRON) 4 MG tablet Take 1 tablet (4 mg total) by mouth 4 (four) times daily. 120 tablet 0  . FeFum-FePoly-FA-B Cmp-C-Biot (INTEGRA PLUS) CAPS Take 1 capsule by mouth every morning. 30 capsule 2  . GILOTRIF 30 MG tablet TAKE 1 TABLET BY MOUTH DAILY. TAKE ON AN EMPTY STOMACH 1 HOUR BEFORE OR 2 HOURS AFTER MEALS. 30 tablet 2  . HYDROcodone-acetaminophen (NORCO) 5-325 MG tablet Take 1-2 tablets by mouth every 4 (four) hours as needed for moderate pain. 60 tablet 0  . ibuprofen (ADVIL,MOTRIN) 200 MG tablet Take 200 mg by mouth every 6 (six) hours as needed for moderate pain. Reported on 12/19/2015    . loperamide (IMODIUM) 2 MG capsule Take 2 mg by mouth as needed for diarrhea or loose stools (Up to 8 times daily, if needed.). Reported on 12/19/2015    . Nutritional Supplements (FEEDING SUPPLEMENT, OSMOLITE 1.5 CAL,) LIQD Place 237 mLs into feeding tube 4 (four) times daily. (Patient taking differently: Place 237 mLs into feeding tube 2 (two) times daily. ) 958 mL 12  . OLANZapine (ZYPREXA) 10 MG tablet TAKE 1 TABLET (10 MG TOTAL) BY MOUTH AT BEDTIME. (INS PAYS 12/13/2015) 30 tablet 0  . traMADol (ULTRAM) 50 MG tablet Take 1 tablet (50 mg total) by mouth every 6 (six) hours as needed. 60 tablet 0  . Wound Dressings (SONAFINE EX) Apply 1 application topically daily.     No current facility-administered medications for this encounter.      ALLERGIES: Sulfonamide derivatives   LABORATORY DATA:  Lab Results  Component  Value Date   WBC 4.8 02/19/2016   HGB 9.5 (L) 02/19/2016   HCT 29.2 (L) 02/19/2016   MCV 90.1 02/19/2016   PLT 180 02/19/2016   Lab Results  Component Value Date   NA 144 02/19/2016   K 3.7 02/19/2016   CL 108 04/12/2015   CO2 24 02/19/2016   Lab Results  Component Value Date   ALT 15 02/19/2016   AST 19 02/19/2016   ALKPHOS 60 02/19/2016   BILITOT <0.30 02/19/2016     NARRATIVE: Laura Walter was seen today for weekly treatment management. The chart was checked and the patient's films were reviewed.  Laura Walter completed external radiation today to her right upper lobe and her T8-T10 spine. Reports level 2/10 pain in her bilateral upper extremities and note decrease range of motion in her arms. Note fatigue. Ambulating with assistive device-rolling walker. She continues to have an inability to close her left hand which started about a week and a half ago. She mentions her pain is getting better.  PHYSICAL EXAMINATION  alert, no acute distress  ASSESSMENT: The patient is doing satisfactorily with treatment.  PLAN: She completed treatment today. We discussed that we could talk to occupational or physical therapy about her hand if her weakness does not resolve. She has a follow up appointment with Dr. Julien Nordmann 03/19/2016. She has a one month follow up  appointment scheduled 04/03/2016.    ------------------------------------------------  Jodelle Gross, MD, PhD    This document serves as a record of services personally performed by Kyung Rudd, MD. It was created on his behalf by Lendon Collar, a trained medical scribe. The creation of this record is based on the scribe's personal observations and the provider's statements to them. This document has been checked and approved by the attending provider.

## 2016-03-04 NOTE — Progress Notes (Signed)
Laura Walter completes XRT today to her Right Upper Lobe and her T8-T10 spine.  Reports level 2/10 pain in her bilateral upper extremities and note decrease ROM in her arms.  Note fatigue.  Ambulating with assistive device-rolling walker.  She continues to have the inability to close her left hand which started ~ 1 1/2 weeks ago.

## 2016-03-05 ENCOUNTER — Ambulatory Visit: Payer: BC Managed Care – PPO

## 2016-03-06 ENCOUNTER — Ambulatory Visit: Payer: BC Managed Care – PPO

## 2016-03-06 ENCOUNTER — Other Ambulatory Visit: Payer: Self-pay | Admitting: Internal Medicine

## 2016-03-07 ENCOUNTER — Ambulatory Visit: Payer: BC Managed Care – PPO

## 2016-03-07 ENCOUNTER — Encounter: Payer: Self-pay | Admitting: *Deleted

## 2016-03-07 ENCOUNTER — Telehealth: Payer: Self-pay | Admitting: *Deleted

## 2016-03-07 ENCOUNTER — Other Ambulatory Visit: Payer: Self-pay | Admitting: Medical Oncology

## 2016-03-07 DIAGNOSIS — C7951 Secondary malignant neoplasm of bone: Secondary | ICD-10-CM

## 2016-03-07 DIAGNOSIS — C3411 Malignant neoplasm of upper lobe, right bronchus or lung: Secondary | ICD-10-CM

## 2016-03-07 NOTE — Telephone Encounter (Signed)
Oncology Nurse Navigator Documentation  Oncology Nurse Navigator Flowsheets 03/07/2016  Navigator Encounter Type Telephone  Telephone Outgoing Call  Treatment Phase Treatment  Barriers/Navigation Needs Coordination of Care  Interventions Coordination of Care  Acuity Level 1  Acuity Level 1 Minimal follow up required  Time Spent with Patient 30   Per Dr. Worthy Flank request.  I called and spoke with patient.  I updated her on he wanting to send tissue obtained [facility] 11/06/15 [facility].  She was in agreed with plan.  Patient stated she only has 3 oral biologic meds left.  I updated Dr. Julien Nordmann.  He asked to see her tomorrow.  I updated patient and she will be here tomorrow at 8:15 labs and 8:30 Appt Dr. Julien Nordmann.

## 2016-03-08 ENCOUNTER — Ambulatory Visit: Payer: BC Managed Care – PPO

## 2016-03-08 ENCOUNTER — Telehealth: Payer: Self-pay | Admitting: Medical Oncology

## 2016-03-08 ENCOUNTER — Other Ambulatory Visit: Payer: Self-pay | Admitting: Medical Oncology

## 2016-03-08 ENCOUNTER — Encounter: Payer: Self-pay | Admitting: Internal Medicine

## 2016-03-08 ENCOUNTER — Ambulatory Visit (HOSPITAL_BASED_OUTPATIENT_CLINIC_OR_DEPARTMENT_OTHER): Payer: BC Managed Care – PPO | Admitting: Internal Medicine

## 2016-03-08 ENCOUNTER — Other Ambulatory Visit (HOSPITAL_BASED_OUTPATIENT_CLINIC_OR_DEPARTMENT_OTHER): Payer: BC Managed Care – PPO

## 2016-03-08 ENCOUNTER — Telehealth: Payer: Self-pay | Admitting: Internal Medicine

## 2016-03-08 ENCOUNTER — Telehealth: Payer: Self-pay | Admitting: *Deleted

## 2016-03-08 ENCOUNTER — Ambulatory Visit (HOSPITAL_COMMUNITY)
Admission: RE | Admit: 2016-03-08 | Discharge: 2016-03-08 | Disposition: A | Discharge status: Home / Self Care | Payer: BC Managed Care – PPO | Source: Ambulatory Visit | Attending: Family Medicine | Admitting: Family Medicine

## 2016-03-08 VITALS — BP 123/73 | HR 92 | Resp 18 | Ht 67.0 in | Wt 128.1 lb

## 2016-03-08 DIAGNOSIS — R52 Pain, unspecified: Secondary | ICD-10-CM

## 2016-03-08 DIAGNOSIS — R110 Nausea: Secondary | ICD-10-CM

## 2016-03-08 DIAGNOSIS — C3411 Malignant neoplasm of upper lobe, right bronchus or lung: Secondary | ICD-10-CM

## 2016-03-08 DIAGNOSIS — E860 Dehydration: Secondary | ICD-10-CM | POA: Diagnosis not present

## 2016-03-08 DIAGNOSIS — C7951 Secondary malignant neoplasm of bone: Secondary | ICD-10-CM

## 2016-03-08 DIAGNOSIS — Z5111 Encounter for antineoplastic chemotherapy: Secondary | ICD-10-CM | POA: Diagnosis not present

## 2016-03-08 DIAGNOSIS — C7931 Secondary malignant neoplasm of brain: Secondary | ICD-10-CM

## 2016-03-08 DIAGNOSIS — D630 Anemia in neoplastic disease: Secondary | ICD-10-CM

## 2016-03-08 DIAGNOSIS — G893 Neoplasm related pain (acute) (chronic): Secondary | ICD-10-CM

## 2016-03-08 LAB — CBC WITH DIFFERENTIAL/PLATELET
BASO%: 0.4 % (ref 0.0–2.0)
BASOS ABS: 0 10*3/uL (ref 0.0–0.1)
EOS%: 0 % (ref 0.0–7.0)
Eosinophils Absolute: 0 10*3/uL (ref 0.0–0.5)
HEMATOCRIT: 31.6 % — AB (ref 34.8–46.6)
HGB: 10.2 g/dL — ABNORMAL LOW (ref 11.6–15.9)
LYMPH#: 0.1 10*3/uL — AB (ref 0.9–3.3)
LYMPH%: 1.4 % — ABNORMAL LOW (ref 14.0–49.7)
MCH: 29.9 pg (ref 25.1–34.0)
MCHC: 32.4 g/dL (ref 31.5–36.0)
MCV: 92.5 fL (ref 79.5–101.0)
MONO#: 0.5 10*3/uL (ref 0.1–0.9)
MONO%: 5.1 % (ref 0.0–14.0)
NEUT#: 10.1 10*3/uL — ABNORMAL HIGH (ref 1.5–6.5)
NEUT%: 93.1 % — AB (ref 38.4–76.8)
PLATELETS: 161 10*3/uL (ref 145–400)
RBC: 3.42 10*6/uL — ABNORMAL LOW (ref 3.70–5.45)
RDW: 17.3 % — ABNORMAL HIGH (ref 11.2–14.5)
WBC: 10.8 10*3/uL — ABNORMAL HIGH (ref 3.9–10.3)

## 2016-03-08 LAB — COMPREHENSIVE METABOLIC PANEL
ALK PHOS: 75 U/L (ref 40–150)
ALT: 29 U/L (ref 0–55)
ANION GAP: 12 meq/L — AB (ref 3–11)
AST: 15 U/L (ref 5–34)
Albumin: 2.8 g/dL — ABNORMAL LOW (ref 3.5–5.0)
BUN: 41.3 mg/dL — ABNORMAL HIGH (ref 7.0–26.0)
CALCIUM: 9.1 mg/dL (ref 8.4–10.4)
CHLORIDE: 106 meq/L (ref 98–109)
CO2: 20 mEq/L — ABNORMAL LOW (ref 22–29)
CREATININE: 0.8 mg/dL (ref 0.6–1.1)
EGFR: 77 mL/min/{1.73_m2} — ABNORMAL LOW (ref >90)
Glucose: 152 mg/dl — ABNORMAL HIGH (ref 70–140)
Potassium: 4.3 mEq/L (ref 3.5–5.1)
Sodium: 138 mEq/L (ref 136–145)
Total Bilirubin: 0.3 mg/dL (ref 0.20–1.20)
Total Protein: 6.3 g/dL — ABNORMAL LOW (ref 6.4–8.3)

## 2016-03-08 MED ORDER — SODIUM CHLORIDE 0.9 % IV SOLN: Freq: Once | INTRAVENOUS | Status: DC

## 2016-03-08 MED ORDER — FOLIC ACID 1 MG PO TABS: 1.0000 mg | ORAL_TABLET | Freq: Every day | ORAL | 4 refills | Status: DC

## 2016-03-08 MED ORDER — SODIUM CHLORIDE 0.9 % IV SOLN
INTRAVENOUS | Status: AC
  Administered 2016-03-08: 10:00:00 via INTRAVENOUS

## 2016-03-08 MED ORDER — SODIUM CHLORIDE 0.9 % IV SOLN: INTRAVENOUS | Status: AC

## 2016-03-08 MED ORDER — CYANOCOBALAMIN 1000 MCG/ML IJ SOLN
1000.0000 ug | Freq: Once | INTRAMUSCULAR | Status: AC
  Administered 2016-03-08: 1000 ug via INTRAMUSCULAR

## 2016-03-08 NOTE — Telephone Encounter (Signed)
Increased osmolyte to 1 can tid -faxed to [facility]

## 2016-03-08 NOTE — Progress Notes (Signed)
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 Lisbeth Renshaw. 2) Gilotrif 40 mg by mouth daily. Status post 5 months of treatment. 3) stereotactic radiotherapy to the enlarging right upper lobe lung mass under the care of Dr. Lisbeth Renshaw. 4) stereotactic radiotherapy to another right upper lobe lung mass as well as solitary brain metastases. 5) Gilotrif 30 mg by mouth daily. First dose started 08/31/2015. Status post 6 months of treatment, discontinued secondary to disease progression. 6) palliative radiotherapy to the progressive lesion in the right side of the chest as well as the metastatic bone lesion involving C6 and C7 vertebrae with epidural tumor in the lateral recess as well as palliative radiotherapy to T8-the in the spine, under the care of Dr. Lisbeth Renshaw completed on 03/04/2016.  CURRENT THERAPY:  1) Systemic chemotherapy with carboplatin for AUC of 5, Alimta 500 MG/M2 and Ketruda (pembrolizumab) 200 MG/M2 every 3 weeks. First dose 03/13/2016. 2) Xgeva 120 mg subcutaneously every 4 weeks. First dose 05/01/2015.  INTERVAL HISTORY: Laura Walter 65 y.o. female returns to the clinic today for follow-up visit accompanied by her partner and friend. The patient is feeling fine today with no specific complaints except weakness in her left hand. The pain in the right side of the chest is much better. She also continues to have generalized fatigue. She denied having any significant chest pain, shortness of breath,  cough or hemoptysis. She has no nausea or vomiting. She denied having any significant weight loss or night sweats. She has no fever or chills. She is tolerating her current treatment with Gilotrif fairly well but unfortunately her recent imaging studies showed further evidence for disease progression. Her family are concerned about her oral intake and they requested IV fluid to help with her dehydration. She is here today for evaluation and discussion of her treatment options.   MEDICAL HISTORY: Past Medical History:  Diagnosis Date  . Allergy    seasonal  . Bone cancer ([place]) 01/10/15 PET   lytic lesion lt 1st rib,T9,L3 Lt acetabulum  . Brain cancer ([place]) 01/13/15   MRI multiple small brain mets 10 individual lesions  . Bronchitis   . H/O reactive hypoglycemia   . IBS (irritable bowel syndrome)   . Lung cancer ([place])    stageIV nscca rul  . Osteopenia   . Paronychia 11/17/2015  . Pneumonia 2009 & 2011  . S/P radiation therapy completed 02/08/15   whole brain ,spine  . S/P radiation therapy completed 11/27/15   lung    ALLERGIES:  is allergic to sulfonamide derivatives.  MEDICATIONS:  Current Outpatient Prescriptions  Medication Sig Dispense Refill  . dexamethasone (DECADRON) 4 MG tablet Take 1 tablet (4 mg total) by mouth 4 (four) times daily. 120 tablet 0  . FeFum-FePoly-FA-B Cmp-C-Biot (INTEGRA PLUS) CAPS Take 1 capsule by mouth every morning. 30 capsule 2  . HYDROcodone-acetaminophen (NORCO) 5-325 MG tablet Take 1-2 tablets by mouth every 4 (four) hours as needed for moderate pain. 60 tablet 0  . ibuprofen (ADVIL,MOTRIN) 200  MG tablet Take 200 mg by mouth every 6 (six) hours as needed for moderate pain. Reported on 12/19/2015    . Nutritional Supplements (FEEDING SUPPLEMENT, OSMOLITE 1.5 CAL,) LIQD Place 237 mLs into feeding tube 4 (four) times daily. (Patient taking differently: Place 237 mLs into feeding tube 2 (two) times daily. ) 958 mL 12  . OLANZapine (ZYPREXA) 10 MG tablet TAKE 1  TABLET (10 MG TOTAL) BY MOUTH AT BEDTIME. (INS PAYS 12/13/2015) 30 tablet 0  . GILOTRIF 30 MG tablet TAKE 1 TABLET BY MOUTH DAILY. TAKE ON AN EMPTY STOMACH 1 HOUR BEFORE OR 2 HOURS AFTER MEALS. (Patient not taking: Reported on 03/08/2016) 30 tablet 2  . loperamide (IMODIUM) 2 MG capsule Take 2 mg by mouth as needed for diarrhea or loose stools (Up to 8 times daily, if needed.). Reported on 12/19/2015    . traMADol (ULTRAM) 50 MG tablet Take 1 tablet (50 mg total) by mouth every 6 (six) hours as needed. (Patient not taking: Reported on 03/08/2016) 60 tablet 0  . Wound Dressings (SONAFINE EX) Apply 1 application topically daily.     No current facility-administered medications for this visit.     SURGICAL HISTORY:  Past Surgical History:  Procedure Laterality Date  . COLONOSCOPY  2009   Negative; [facility]  . ESOPHAGOGASTRODUODENOSCOPY (EGD) WITH PROPOFOL N/A 04/11/2015   Procedure: ESOPHAGOGASTRODUODENOSCOPY (EGD) WITH PROPOFOL;  Surgeon: Carol Ada, MD;  Location: [facility];  Service: Endoscopy;  Laterality: N/A;  . PEG PLACEMENT N/A 04/11/2015   Procedure: PERCUTANEOUS ENDOSCOPIC GASTROSTOMY (PEG) PLACEMENT;  Surgeon: Carol Ada, MD;  Location: [facility];  Service: Endoscopy;  Laterality: N/A;  . TONSILLECTOMY AND ADENOIDECTOMY    . WISDOM TOOTH EXTRACTION      REVIEW OF SYSTEMS:  Constitutional: positive for fatigue Eyes: negative Ears, nose, mouth, throat, and face: negative Respiratory: negative Cardiovascular: negative Gastrointestinal: negative Genitourinary:negative Integument/breast: negative Hematologic/lymphatic: negative Musculoskeletal:positive for bone pain and muscle weakness Neurological: negative Behavioral/Psych: negative Endocrine: negative Allergic/Immunologic: negative   PHYSICAL EXAMINATION: General appearance: alert, cooperative, fatigued and no distress Head: Normocephalic, without obvious abnormality, Several areas of bruises and ecchymosis on the right  side of the face Neck: no adenopathy, no JVD, supple, symmetrical, trachea midline and thyroid not enlarged, symmetric, no tenderness/mass/nodules Lymph nodes: Cervical, supraclavicular, and axillary nodes normal. Resp: clear to auscultation bilaterally Back: Tenderness to palpation at the lower thoracic and lumbar vertebrae. Cardio: regular rate and rhythm, S1, S2 normal, no murmur, click, rub or gallop GI: soft, non-tender; bowel sounds normal; no masses,  no organomegaly Extremities: extremities normal, atraumatic, no cyanosis or edema Neurologic: Alert and oriented X 3, normal strength and tone. Normal symmetric reflexes. Normal coordination and gait    ECOG PERFORMANCE STATUS: 1 - Symptomatic but completely ambulatory  There were no vitals taken for this visit.  LABORATORY DATA: Lab Results  Component Value Date   WBC 10.8 (H) 03/08/2016   HGB 10.2 (L) 03/08/2016   HCT 31.6 (L) 03/08/2016   MCV 92.5 03/08/2016   PLT 161 03/08/2016      Chemistry      Component Value Date/Time   NA 144 02/19/2016 1132   K 3.7 02/19/2016 1132   CL 108 04/12/2015 0835   CO2 24 02/19/2016 1132   BUN 25.1 02/19/2016 1132   CREATININE 0.8 02/19/2016 1132      Component Value Date/Time   CALCIUM 8.4 02/19/2016 1132   ALKPHOS 60 02/19/2016 1132   AST 19 02/19/2016 1132   ALT  15 02/19/2016 1132   BILITOT <0.30 02/19/2016 1132       RADIOGRAPHIC STUDIES: Mr Jeri Cos QJ Contrast  Addendum Date: 02/23/2016   ADDENDUM REPORT: 02/23/2016 14:38 ADDENDUM: Original report by: Franchot Gallo M.D. The second paragraph of the Findings section should read: "Small enhancing lesion right insular cortex continues to IMPROVE and is minimally identified on the coronal postcontrast images." Also, the second Impression paragraph should read: "Right insular metastatic deposit no longer visualized. Right insular enhancing lesion continues to IMPROVE. No other new lesions." Electronically Signed   By: Genevie Ann  M.D.   On: 02/23/2016 14:38  Result Date: 02/23/2016 CLINICAL DATA:  Metastatic non-small cell lung cancer. Whole-brain radiation 02/09/2015. EXAM: MRI HEAD WITHOUT AND WITH CONTRAST TECHNIQUE: Multiplanar, multiecho pulse sequences of the brain and surrounding structures were obtained without and with intravenous contrast. CONTRAST:  40m MULTIHANCE GADOBENATE DIMEGLUMINE 529 MG/ML IV SOLN COMPARISON:  MRI 10/27/2015, 03/29/2015 FINDINGS: New enhancing metastatic deposit in the left frontal lobe over the convexity measuring 5.5 mm. This has a small amount of hemorrhage and increased surrounding vasogenic edema. This is not seen on the prior study. No other new enhancing lesions identified. Small enhancing lesion right insular cortex continues to removed and is minimally identified on the coronal postcontrast images. Progression of diffuse white matter hyperintensity compatible with whole-brain radiation. Ventricle size is normal.  No shift of the midline structures. Negative for acute infarct. No lesions in the skull base or calvarium. IMPRESSION: 5.3 mm metastatic deposit left frontal lobe over the convexity is new. There is vasogenic edema and a small amount of hemorrhage in the lesion. Right insular metastatic deposit no longer visualized. Right insular enhancing lesion continues to improved. No other new lesions. Progression of diffuse white matter signal changes due whole-brain radiation. Electronically Signed: By: CFranchot GalloM.D. On: 02/09/2016 15:08    ASSESSMENT AND PLAN: This is a very pleasant 65years old never smoker white female recently diagnosed with stage IV non-small cell lung cancer, adenocarcinoma with positive EGFR mutation with deletion in exon 19 presented with large right upper lobe lung mass in addition to bilateral mediastinal, supraclavicular lymphadenopathy as well as bone and multiple brain metastasis diagnosed in June 2016.  The patient completed whole brain irradiation under  the care of Dr. MLisbeth Renshaw She was started on treatment with Gilotrif 40 mg by mouth daily status post 5 months but this was discontinued secondary to intolerance with significant bony contact, skin rash in the sacral area as well as few episodes of diarrhea  The patient was started on Gilotrif 30 mg by mouth daily since 08/31/2015. She is tolerating it much better with the reduced dose was less adverse effects. Unfortunately the recent CT scan of the chest, abdomen and pelvis showed interval increase in the size of the right upper lobe lung mass compatible with disease progression. She is currently undergoing stereotactic radiotherapy to the enlarging right upper lobe lung mass. Molecular studies from blood test as well as CT-guided core biopsy of the enlarging right upper lobe lung mass showed no evidence for T790M resistant mutation. She is tolerating her treatment with Gilotrif fairly well. Unfortunately the recent CT scan of the chest, abdomen and pelvis showed mixed response with new pleural based nodule adjacent to the previously treated right upper lobe mass. She underwent palliative radiotherapy to the new pleural-based nodule under the care of Dr. MLisbeth Renshaw She is also undergoing stereotactic radiotherapy to new solitary brain metastasis. She also underwent palliative radiotherapy  to the lesion in the cervical spines.  I had a lengthy discussion with the patient and her family today about her current disease status and treatment options. I recommended for the patient to discontinue her treatment with Gilotrif at this point. She was given the option of palliative care versus proceeding with systemic chemotherapy with carboplatin for AUC of 5, Alimta 500 MG/M2 and Ketruda 200 MG IV every 3 weeks. I discussed with the patient adverse effect of this treatment. The patient would like to proceed with the systemic chemotherapy. She is expected to start the first dose of this treatment on 03/13/2016. I will  arrange for the patient to receive vitamin B 12 injection today. I will call her pharmacy with prescription for folic acid 1 mg by mouth daily. She is currently on Decadron 4 mg by mouth 4 times a day. I recommended for the patient a taper schedule for her Decadron every 3 days to reach a lower dose before starting treatment with [place]. I also discussed with the patient sending [facility] for extensive molecular study to rule out the possibility of other resistant mutations. For dehydration, I will arrange for the patient to receive 1 L of normal saline today. For the metastatic bone disease, I continue the patient on Xgeva 120 mg subcutaneously on monthly basis. The patient was also advised to increased her intake of calcium supplement and vitamin D. For the nausea, she will continue on Zyprexa 10 mg by mouth daily at bedtime.  For pain management, she would continue on tramadol 50 mg by mouth every 6 hours as needed for pain. For the anemia, I started the patient on Integra +1 capsule by mouth daily. The patient would come back for follow-up visit in one month's for evaluation and management of any adverse effect of her treatment before starting cycle #4. She was advised to call immediately if she has any concerning symptoms in the interval. The patient voices understanding of current disease status and treatment options and is in agreement with the current care plan.  All questions were answered. The patient knows to call the clinic with any problems, questions or concerns. We can certainly see the patient much sooner if necessary.  Disclaimer: This note was dictated with voice recognition software. Similar sounding words can inadvertently be transcribed and may not be corrected upon review.  [place]

## 2016-03-08 NOTE — Telephone Encounter (Signed)
per pof to sch IVF-sch @ [facility]-adv Diane to tell pt to go after MD appt

## 2016-03-08 NOTE — Discharge Instructions (Signed)
Today, you received an IV infusion of 1 liter of normal saline per physician order.  Please follow up with your oncology provider for additional instructions or concerns.

## 2016-03-08 NOTE — Telephone Encounter (Signed)
Per staff message and POF I have scheduled appts. Advised scheduler of appts. JMW  

## 2016-03-08 NOTE — Procedures (Signed)
[facility]  Procedure Note  Laura Walter ZDG:[id number] DOB: May 21, 1951 DOA: 03/08/2016   Ordering Provider: Donne Anon  Associated Diagnosis: Malignant neoplasm of upper lobe of right lung (HCC) (C34.11); Bone metastases (HCC) (C79.51); Encounter for antineoplastic chemotherapy (Z51.11)  Procedure Note: IV infusion of 1 liter Normal Saline over 2 hours   Condition During Procedure: Pt tolerated well   Condition at Discharge:  Pt alert, oriented, ambulatory   Tamala Julian, Salley Slaughter, [facility]

## 2016-03-08 NOTE — Telephone Encounter (Signed)
Gave pt cal & avs °

## 2016-03-09 ENCOUNTER — Emergency Department (HOSPITAL_COMMUNITY): Payer: BC Managed Care – PPO

## 2016-03-09 ENCOUNTER — Emergency Department (HOSPITAL_COMMUNITY)
Admission: EM | Admit: 2016-03-09 | Discharge: 2016-03-10 | Disposition: A | Discharge status: Home / Self Care | Payer: BC Managed Care – PPO | Attending: Emergency Medicine | Admitting: Emergency Medicine

## 2016-03-09 ENCOUNTER — Encounter (HOSPITAL_COMMUNITY): Payer: Self-pay | Admitting: *Deleted

## 2016-03-09 ENCOUNTER — Other Ambulatory Visit: Payer: Self-pay

## 2016-03-09 DIAGNOSIS — R4182 Altered mental status, unspecified: Secondary | ICD-10-CM | POA: Diagnosis present

## 2016-03-09 DIAGNOSIS — Z85118 Personal history of other malignant neoplasm of bronchus and lung: Secondary | ICD-10-CM | POA: Insufficient documentation

## 2016-03-09 DIAGNOSIS — Z85830 Personal history of malignant neoplasm of bone: Secondary | ICD-10-CM | POA: Diagnosis not present

## 2016-03-09 DIAGNOSIS — D630 Anemia in neoplastic disease: Secondary | ICD-10-CM | POA: Insufficient documentation

## 2016-03-09 DIAGNOSIS — R4701 Aphasia: Secondary | ICD-10-CM

## 2016-03-09 DIAGNOSIS — C7931 Secondary malignant neoplasm of brain: Secondary | ICD-10-CM | POA: Diagnosis not present

## 2016-03-09 DIAGNOSIS — R569 Unspecified convulsions: Secondary | ICD-10-CM | POA: Diagnosis not present

## 2016-03-09 DIAGNOSIS — E860 Dehydration: Secondary | ICD-10-CM | POA: Insufficient documentation

## 2016-03-09 DIAGNOSIS — R52 Pain, unspecified: Secondary | ICD-10-CM | POA: Insufficient documentation

## 2016-03-09 LAB — CBC WITH DIFFERENTIAL/PLATELET
BASOS ABS: 0 10*3/uL (ref 0.0–0.1)
BASOS PCT: 0 %
EOS ABS: 0 10*3/uL (ref 0.0–0.7)
Eosinophils Relative: 0 %
HEMATOCRIT: 30.3 % — AB (ref 36.0–46.0)
HEMOGLOBIN: 9.5 g/dL — AB (ref 12.0–15.0)
Lymphocytes Relative: 8 %
Lymphs Abs: 0.8 10*3/uL (ref 0.7–4.0)
MCH: 29.5 pg (ref 26.0–34.0)
MCHC: 31.4 g/dL (ref 30.0–36.0)
MCV: 94.1 fL (ref 78.0–100.0)
Monocytes Absolute: 0.3 10*3/uL (ref 0.1–1.0)
Monocytes Relative: 3 %
NEUTROS ABS: 8.9 10*3/uL — AB (ref 1.7–7.7)
NEUTROS PCT: 89 %
Platelets: 126 10*3/uL — ABNORMAL LOW (ref 150–400)
RBC: 3.22 MIL/uL — ABNORMAL LOW (ref 3.87–5.11)
RDW: 16.6 % — AB (ref 11.5–15.5)
WBC: 10 10*3/uL (ref 4.0–10.5)

## 2016-03-09 LAB — COMPREHENSIVE METABOLIC PANEL
ALK PHOS: 57 U/L (ref 38–126)
ALT: 31 U/L (ref 14–54)
AST: 24 U/L (ref 15–41)
Albumin: 2.8 g/dL — ABNORMAL LOW (ref 3.5–5.0)
Anion gap: 6 (ref 5–15)
BILIRUBIN TOTAL: 0.5 mg/dL (ref 0.3–1.2)
BUN: 38 mg/dL — AB (ref 6–20)
CALCIUM: 8.6 mg/dL — AB (ref 8.9–10.3)
CO2: 26 mmol/L (ref 22–32)
CREATININE: 0.75 mg/dL (ref 0.44–1.00)
Chloride: 105 mmol/L (ref 101–111)
Glucose, Bld: 86 mg/dL (ref 65–99)
Potassium: 3.9 mmol/L (ref 3.5–5.1)
Sodium: 137 mmol/L (ref 135–145)
Total Protein: 5.8 g/dL — ABNORMAL LOW (ref 6.5–8.1)

## 2016-03-09 NOTE — ED Provider Notes (Addendum)
First MD Initiated Contact with Patient 03/09/16 2330      By signing my name below, I, Maud Deed. Royston Sinner, attest that this documentation has been prepared under the direction and in the presence of Ivalee, DO.  Electronically Signed: Maud Deed. Royston Sinner, ED Scribe. 03/09/16. 11:42 PM.   TIME SEEN: 11:30 PM   CHIEF COMPLAINT:  Chief Complaint  Patient presents with  . Altered Mental Status    HPI:   HPI Comments: Laura Walter is a 65 y.o. female with a PMHx of primary lung cancer with brain and bones mets currently receiving radiation (oncologist is Dr. Earlie Server) who presents to the Emergency Department here for altered mental status this evening. Pt states she sustained a fall a couple of days ago after becoming weak secondary to radiation. She admits to hitting her head against concrete and now reports an ongoing headache. Family also reports 2 episodes of speech that did not make any sense; one episode yesterday and the second episode earlier this evening. Family states "she just couldn't find her words and her sentences were incomprehensible". She is currently weaning off of Decadron for a known brain lesions and plans to start chemotherapy next week. Denies any recent fever, chills, nausea, vomiting, focal weakness, new numbness (has chronic left hand numbness), cough, chest pain, or diarrhea. Last brain MRI performed on 02/23/16.  Not on anticoagulation.  PCP: [name], MD    ROS: See HPI Constitutional: no fever  Eyes: no drainage  ENT: no runny nose   Cardiovascular:  no chest pain  Resp: no SOB  GI: no vomiting GU: no dysuria Integumentary: no rash  Allergy: no hives  Musculoskeletal: no leg swelling  Neurological: no slurred speech. Positive headache  ROS otherwise negative  PAST MEDICAL HISTORY/PAST SURGICAL HISTORY:  Past Medical History:  Diagnosis Date  . Allergy    seasonal  . Bone cancer ([place]) 01/10/15 PET   lytic lesion lt 1st rib,T9,L3 Lt acetabulum   . Brain cancer ([place]) 01/13/15   MRI multiple small brain mets 10 individual lesions  . Bronchitis   . H/O reactive hypoglycemia   . IBS (irritable bowel syndrome)   . Lung cancer ([place])    stageIV nscca rul  . Osteopenia   . Paronychia 11/17/2015  . Pneumonia 2009 & 2011  . S/P radiation therapy completed 02/08/15   whole brain ,spine  . S/P radiation therapy completed 11/27/15   lung    MEDICATIONS:  Prior to Admission medications   Medication Sig Start Date End Date Taking? Authorizing Provider  dexamethasone (DECADRON) 4 MG tablet Take 1 tablet (4 mg total) by mouth 4 (four) times daily. 02/22/16   Hayden Pedro, PA-C  FeFum-FePoly-FA-B Cmp-C-Biot (INTEGRA PLUS) CAPS Take 1 capsule by mouth every morning. 02/19/16   Curt Bears, MD  folic acid (FOLVITE) 1 MG tablet Take 1 tablet (1 mg total) by mouth daily. 03/08/16   Curt Bears, MD  HYDROcodone-acetaminophen (NORCO) 5-325 MG tablet Take 1-2 tablets by mouth every 4 (four) hours as needed for moderate pain. 02/14/16   Hayden Pedro, PA-C  ibuprofen (ADVIL,MOTRIN) 200 MG tablet Take 200 mg by mouth every 6 (six) hours as needed for moderate pain. Reported on 12/19/2015    Historical Provider, MD  loperamide (IMODIUM) 2 MG capsule Take 2 mg by mouth as needed for diarrhea or loose stools (Up to 8 times daily, if needed.). Reported on 12/19/2015    Historical Provider, MD  Nutritional Supplements (  FEEDING SUPPLEMENT, OSMOLITE 1.5 CAL,) LIQD Place 237 mLs into feeding tube 4 (four) times daily. Patient taking differently: Place 237 mLs into feeding tube 2 (two) times daily.  04/14/15   Carol Ada, MD  OLANZapine (ZYPREXA) 10 MG tablet TAKE 1 TABLET (10 MG TOTAL) BY MOUTH AT BEDTIME. (INS PAYS 12/13/2015) 02/13/16   Curt Bears, MD  traMADol (ULTRAM) 50 MG tablet Take 1 tablet (50 mg total) by mouth every 6 (six) hours as needed. Patient not taking: Reported on 03/08/2016 02/19/16   Curt Bears, MD  Wound Dressings  (SONAFINE EX) Apply 1 application topically daily. 02/16/16   Kyung Rudd, MD    ALLERGIES:  Allergies  Allergen Reactions  . Sulfonamide Derivatives Rash    Childhood reaction    SOCIAL HISTORY:  Social History  Substance Use Topics  . Smoking status: Never Smoker  . Smokeless tobacco: Never Used  . Alcohol use Yes     Comment: occasionally rare wine    FAMILY HISTORY: Family History  Problem Relation Age of Onset  . Transient ischemic attack Mother   . Mental illness Mother     Dementia  . Diabetes Father   . Heart attack Father 13  . Heart attack Maternal Grandfather     in 68s  . Stomach cancer Maternal Aunt   . Heart attack Maternal Aunt     in 55s  . Diabetes Paternal Uncle     X 3    EXAM: BP 123/80 (BP Location: Right Arm)   Pulse 86   Temp 97.3 F (36.3 C) (Oral)   Resp 16   Ht '5\' 7"'$  (1.702 m)   Wt 130 lb 8 oz (59.2 kg)   SpO2 100%   BMI 20.44 kg/m  CONSTITUTIONAL: Alert and oriented and responds appropriately to questions. Chronically ill appearing. Oriented x 3 HEAD: Normocephalic EYES: Conjunctivae clear, PERRL ENT: normal nose; no rhinorrhea; moist mucous membranes NECK: Supple, no meningismus, no LAD  CARD: RRR; S1 and S2 appreciated; no murmurs, no clicks, no rubs, no gallops RESP: Normal chest excursion without splinting or tachypnea; breath sounds clear and equal bilaterally; no wheezes, no rhonchi, no rales, no hypoxia or respiratory distress, speaking full sentences ABD/GI: Normal bowel sounds; non-distended; soft, non-tender, no rebound, no guarding, no peritoneal signs BACK:  The back appears normal and is non-tender to palpation, there is no CVA tenderness EXT: Normal ROM in all joints; non-tender to palpation; no edema; normal capillary refill; no cyanosis, no calf tenderness or swelling    SKIN: Normal color for age and race; warm; no rash NEURO: Moves all extremities equally, sensation to light touch intact diffusely, cranial nerves II  through XII intact. Strength 5/5 in all 4 extremities. No dysarthria or aphasia. PSYCH: The patient's mood and manner are appropriate. Grooming and personal hygiene are appropriate.  MEDICAL DECISION MAKING: Patient here with episode of aphasia once it occurred last night and then again today. Is currently neurologically intact. Does have a history of metastatic brain lesions. Is being weaned off of Decadron to start immunotherapy on August 9. Has had stereotactic radiation to her brain. Her friends at bedside are charted come to the hospital. No history of stroke in the past. Did have a head injury 2 days ago. Is not on anticoagulation. Head CT ordered in triage shows no intracranial hemorrhage, edema, shift. I feel she will need an MRI of her brain. We'll also consult neurology on call. Labs ordered in triage are unremarkable. We'll also order  urinalysis.  ED PROGRESS: 11:50 PM  Spoke to Dr. Leonel Ramsay with Neurology who will see patient consult and recommends MRI of her brain with and without contrast. States this could have also been a seizure. She is not on any antiepileptic medication and is on tramadol. He recommends that she stop tramadol.  3:00 AM  Pt's MRI shows brain mets that are improving.  No infarct or bleed.  Dr. Leonel Ramsay thinks this was a partial complex seizure and recommends outpatient neuro follow up which he will arrange for her with [facility].  Will start Keppra 500 mg every 12 hours, first dose given in ED.  He recommends leaving her steroid taper as is per Dr. Earlie Server.   At this time, I do not feel there is any life-threatening condition present. I have reviewed and discussed all results (EKG, imaging, lab, urine as appropriate), exam findings with patient/family. I have reviewed nursing notes and appropriate previous records.  I feel the patient is safe to be discharged home without further emergent workup and can continue workup as an outpatient. Discussed usual and  customary return precautions. Patient/family verbalize understanding and are comfortable with this plan.  Outpatient follow-up has been provided. All questions have been answered.     EKG Interpretation  Date/Time:  Saturday March 09 2016 23:01:10 EDT Ventricular Rate:  81 PR Interval:    QRS Duration: 83 QT Interval:  386 QTC Calculation: 448 R Axis:   -43 Text Interpretation:  Sinus rhythm Borderline short PR interval Left axis deviation Abnormal R-wave progression, late transition No old tracing to compare Confirmed by Domanique Luckett,  DO, Paxton Binns ([id number]) on 03/09/2016 11:08:54 PM       I personally performed the services described in this documentation, which was scribed in my presence. The recorded information has been reviewed and is accurate.    [place], DO 03/10/16 [place], DO 03/10/16 [id number]

## 2016-03-09 NOTE — ED Notes (Signed)
MD at bedside. 

## 2016-03-09 NOTE — ED Triage Notes (Signed)
Patient presents friends stating Monday she fell straight back missing a step and hit the back of her head.  Had radiation on Monday as well  Yesterday she started with word salad but was able to name items but could not tell a story.  Today had several bouts of word salad

## 2016-03-10 ENCOUNTER — Emergency Department (HOSPITAL_COMMUNITY): Payer: BC Managed Care – PPO

## 2016-03-10 DIAGNOSIS — R4701 Aphasia: Secondary | ICD-10-CM

## 2016-03-10 LAB — URINE MICROSCOPIC-ADD ON: WBC, UA: NONE SEEN WBC/hpf (ref 0–5)

## 2016-03-10 LAB — URINALYSIS, ROUTINE W REFLEX MICROSCOPIC
BILIRUBIN URINE: NEGATIVE
GLUCOSE, UA: NEGATIVE mg/dL
Ketones, ur: NEGATIVE mg/dL
Leukocytes, UA: NEGATIVE
Nitrite: NEGATIVE
PROTEIN: NEGATIVE mg/dL
Specific Gravity, Urine: 1.028 (ref 1.005–1.030)
pH: 6 (ref 5.0–8.0)

## 2016-03-10 MED ORDER — LEVETIRACETAM 500 MG PO TABS: 500.0000 mg | ORAL_TABLET | Freq: Two times a day (BID) | ORAL | 2 refills | Status: DC

## 2016-03-10 MED ORDER — LEVETIRACETAM 500 MG PO TABS
500.0000 mg | ORAL_TABLET | Freq: Once | ORAL | Status: AC
  Administered 2016-03-10: 500 mg via ORAL
  Filled 2016-03-10: qty 1

## 2016-03-10 MED ORDER — ASPIRIN 325 MG PO TABS: 325.0000 mg | ORAL_TABLET | Freq: Every day | ORAL | Status: DC

## 2016-03-10 MED ORDER — GADOBENATE DIMEGLUMINE 529 MG/ML IV SOLN
10.0000 mL | Freq: Once | INTRAVENOUS | Status: AC | PRN
  Administered 2016-03-10: 10 mL via INTRAVENOUS

## 2016-03-10 NOTE — Consult Note (Signed)
Neurology Consultation Reason for Consult: Transient aphasia Referring Physician: ward, K  CC: Transient aphasia  History is obtained from: Patient  HPI: Laura Walter is a 65 y.o. female with a history of stage IV lung cancer with multiple metastasis including one in the left frontal lobe who presents with 2 episodes of confusion. She describes that she was talking earlier tonight and had sudden onset confusion, was talking but the words that using 1 making any sense. She states that she never had trouble understanding people, but she could not say the words that she wanted to say. This lasted for about 10 minutes then improved. She had a similar episode yesterday evening, but they had attributed to her being sleepy. Of note, she is planning on starting any chemotherapy and has been titrating down on her steroids recently.    ROS: A 14 point ROS was performed and is negative except as noted in the HPI.  Past Medical History:  Diagnosis Date  . Allergy    seasonal  . Bone cancer ([place]) 01/10/15 PET   lytic lesion lt 1st rib,T9,L3 Lt acetabulum  . Brain cancer ([place]) 01/13/15   MRI multiple small brain mets 10 individual lesions  . Bronchitis   . H/O reactive hypoglycemia   . IBS (irritable bowel syndrome)   . Lung cancer ([place])    stageIV nscca rul  . Osteopenia   . Paronychia 11/17/2015  . Pneumonia 2009 & 2011  . S/P radiation therapy completed 02/08/15   whole brain ,spine  . S/P radiation therapy completed 11/27/15   lung     Family History  Problem Relation Age of Onset  . Transient ischemic attack Mother   . Mental illness Mother     Dementia  . Diabetes Father   . Heart attack Father 63  . Heart attack Maternal Grandfather     in 3s  . Stomach cancer Maternal Aunt   . Heart attack Maternal Aunt     in 37s  . Diabetes Paternal Uncle     X 3     Social History:  reports that she has never smoked. She has never used smokeless tobacco. She reports that she drinks alcohol.  She reports that she does not use drugs.   Exam: Current vital signs: BP 130/84   Pulse 75   Temp 97.3 F (36.3 C) (Oral)   Resp 23   Ht '5\' 7"'$  (1.702 m)   Wt 59.2 kg (130 lb 8 oz)   SpO2 100%   BMI 20.44 kg/m  Vital signs in last 24 hours: Temp:  [97.3 F (36.3 C)] 97.3 F (36.3 C) (08/05 2224) Pulse Rate:  [73-86] 75 (08/06 0030) Resp:  [16-26] 23 (08/06 0030) BP: (122-130)/(78-84) 130/84 (08/06 0030) SpO2:  [100 %] 100 % (08/06 0030) Weight:  [59.2 kg (130 lb 8 oz)] 59.2 kg (130 lb 8 oz) (08/05 2224)   Physical Exam  Constitutional: Appears well-developed and well-nourished.  Psych: Affect appropriate to situation Eyes: No scleral injection HENT: No OP obstrucion Head: Normocephalic.  Cardiovascular: Normal rate and regular rhythm.  Respiratory: Effort normal and breath sounds normal to anterior ascultation GI: Soft.  No distension. There is no tenderness.  Skin: WDI  Neuro: Mental Status: Patient is awake, alert, oriented to person, place, month, year, and situation. Patient is able to give a clear and coherent history. No signs of aphasia or neglect Cranial Nerves: II: Visual Fields are full. Pupils are equal, round, and reactive to light.  III,IV, VI: EOMI without ptosis or diploplia.  V: Facial sensation is symmetric to temperature VII: Facial movement is symmetric.  VIII: hearing is intact to voice X: Uvula elevates symmetrically XI: Shoulder shrug is symmetric. XII: tongue is midline without atrophy or fasciculations.  Motor: Tone is normal. Bulk is normal. 5/5 strength was present in the right arm and bilateral legs. She has weakness of the intrinsic muscles of the hand as well as elbow extension on the left Sensory: Sensation is symmetric to light touch and temperature in the arms and legs. Cerebellar: No clear ataxia   I have reviewed labs in epic and the results pertinent to this consultation are: CMP-unremarkable  I have reviewed the images  obtained: CT head-no acute findings MRI brain with previous multiple metastasis including left frontal 1.  Impression: 65 year old female with 2 episodes of transient speech dysfunction. Given that she does have multiple metastatic brain lesions, my suspicion is that this represents seizure, possibly from the left frontal lesion seen. The fact that there were 2 so close together makes ischemic insult less likely, however I would favor getting an MRI of the brain to rule out possible ischemic shins.  Recommendations: 1) MRI brain, if severe stenosis on MRA which would explain her symptoms or if there is a new ischemic infarct, then I would favor admission. 2) if there is no evidence that this is due to ischemia on the MRI, then I think that with 2 stereotyped episodes the more likely explanation would be that this is simple partial seizures and I would favor starting her on an antiepileptic medication with outpatient follow-up. 3) Keppra '500mg'$  BID unless there is clear other explanation for her symptoms(e.g. Stroke)    Roland Rack, MD [facility] [phone]  If 7pm- 7am, please page neurology on call as listed in [place].

## 2016-03-10 NOTE — ED Notes (Signed)
Dr. Leonel Ramsay made aware of patient's return to department, will come to bedside when MRI results.

## 2016-03-10 NOTE — ED Notes (Signed)
[facility] at bedside

## 2016-03-10 NOTE — ED Notes (Signed)
Patient transported to MRI 

## 2016-03-11 ENCOUNTER — Telehealth: Payer: Self-pay | Admitting: *Deleted

## 2016-03-11 NOTE — Telephone Encounter (Signed)
Oncology Nurse Navigator Documentation  Oncology Nurse Navigator Flowsheets 03/11/2016  Navigator Encounter Type Telephone  Telephone Outgoing Call  Treatment Phase Treatment  Barriers/Navigation Needs Coordination of Care  Interventions Coordination of Care  Acuity Level 1  Acuity Level 1 Minimal follow up required  Time Spent with Patient Laura Walter called and wanted Laura Walter to be updated on recent ED visit this weekend.  I updated Laura Walter and he is aware of scans.  He stated no new treatment or follow up at this time.  I called Laura Walter back to update her.  She stated Laura Walter has a referral to a neurology office. I will updated Laura Walter.

## 2016-03-12 ENCOUNTER — Ambulatory Visit (INDEPENDENT_AMBULATORY_CARE_PROVIDER_SITE_OTHER): Payer: BC Managed Care – PPO | Admitting: Neurology

## 2016-03-12 ENCOUNTER — Other Ambulatory Visit: Payer: Self-pay | Admitting: Internal Medicine

## 2016-03-12 ENCOUNTER — Encounter: Payer: Self-pay | Admitting: Neurology

## 2016-03-12 VITALS — BP 114/79 | HR 89 | Ht 67.0 in | Wt 125.5 lb

## 2016-03-12 DIAGNOSIS — C7931 Secondary malignant neoplasm of brain: Secondary | ICD-10-CM | POA: Diagnosis not present

## 2016-03-12 DIAGNOSIS — R4701 Aphasia: Secondary | ICD-10-CM

## 2016-03-12 NOTE — Progress Notes (Signed)
Reason for visit: Possible seizure  Referring physician: [place]  Laura Walter is a 65 y.o. female  History of present illness:  Laura Walter is a 65 year old right-handed white female with a history of non-small cell lung cancer involving the right upper lobe of the lung with metastasis to the left frontal lobe of the brain and to bone. The patient also has involvement of the C6 and C7 levels of the cervical spine, she has gotten palliative radiotherapy to this area. She has had whole brain radiation and stereotactic radiation to the left frontal area. Since beginning stereotactic radiation, the patient has had a significant increase in fatigue and drowsiness. On 03/08/2016, the patient had an event of confused speech lasting about an hour. This recurred on 03/09/2016, and the patient was taken to the emergency room for an evaluation. MRI of the brain did not show any acute changes. The patient had sustained a fall 2 weeks ago and hit the back of her head, she was not rendered unconscious. During the emergency room visit on August 6, the patient was placed on Keppra for presumed seizures. The patient had sustained sudden onset of left arm weakness that was presumed related to the cervical spine involvement again approximately 2 weeks ago. The patient has had a pervasive change in her cognitive function over the last 3 weeks with slowness of thought process and associated increased generalized fatigue. The patient has some gait instability, she is now using a walker for ambulation. She denies any issues controlling the bowels or the bladder. She reports no other new numbness or weakness other than the left arm issues. She is sent to this office for an evaluation.  Past Medical History:  Diagnosis Date  . Allergy    seasonal  . Bone cancer ([place]) 01/10/15 PET   lytic lesion lt 1st rib,T9,L3 Lt acetabulum  . Brain cancer ([place]) 01/13/15   MRI multiple small brain mets 10 individual lesions  . Bronchitis     . H/O reactive hypoglycemia   . IBS (irritable bowel syndrome)   . Lung cancer ([place])    stageIV nscca rul  . Osteopenia   . Paronychia 11/17/2015  . Pneumonia 2009 & 2011  . S/P radiation therapy completed 02/08/15   whole brain ,spine  . S/P radiation therapy completed 11/27/15   lung    Past Surgical History:  Procedure Laterality Date  . COLONOSCOPY  2009   Negative; [facility]  . ESOPHAGOGASTRODUODENOSCOPY (EGD) WITH PROPOFOL N/A 04/11/2015   Procedure: ESOPHAGOGASTRODUODENOSCOPY (EGD) WITH PROPOFOL;  Surgeon: Carol Ada, MD;  Location: [facility];  Service: Endoscopy;  Laterality: N/A;  . PEG PLACEMENT N/A 04/11/2015   Procedure: PERCUTANEOUS ENDOSCOPIC GASTROSTOMY (PEG) PLACEMENT;  Surgeon: Carol Ada, MD;  Location: [facility];  Service: Endoscopy;  Laterality: N/A;  . TONSILLECTOMY AND ADENOIDECTOMY    . WISDOM TOOTH EXTRACTION      Family History  Problem Relation Age of Onset  . Transient ischemic attack Mother   . Mental illness Mother     Dementia  . Diabetes Father   . Heart attack Father 51  . Heart attack Maternal Grandfather     in 54s  . Stomach cancer Maternal Aunt   . Heart attack Maternal Aunt     in 73s  . Diabetes Paternal Uncle     X 3    Social history:  reports that she has never smoked. She has never used smokeless tobacco. She reports that she drinks  alcohol. She reports that she does not use drugs.  Medications:  Prior to Admission medications   Medication Sig Start Date End Date Taking? Authorizing Provider  dexamethasone (DECADRON) 4 MG tablet Take 1 tablet (4 mg total) by mouth 4 (four) times daily. 02/22/16  Yes Hayden Pedro, PA-C  FeFum-FePoly-FA-B Cmp-C-Biot (INTEGRA PLUS) CAPS Take 1 capsule by mouth every morning. 02/19/16  Yes Curt Bears, MD  folic acid (FOLVITE) 1 MG tablet Take 1 tablet (1 mg total) by mouth daily. 03/08/16  Yes Curt Bears, MD  levETIRAcetam (KEPPRA) 500 MG tablet Take 1 tablet (500 mg total) by  mouth 2 (two) times daily. 03/10/16  Yes Kristen N Ward, DO  Nutritional Supplements (FEEDING SUPPLEMENT, OSMOLITE 1.5 CAL,) LIQD Place 237 mLs into feeding tube 4 (four) times daily. Patient taking differently: Place 237 mLs into feeding tube 2 (two) times daily.  04/14/15  Yes Carol Ada, MD  OLANZapine (ZYPREXA) 10 MG tablet TAKE 1 TABLET (10 MG TOTAL) BY MOUTH AT BEDTIME. (INS PAYS 12/13/2015) 02/13/16  Yes Curt Bears, MD  HYDROcodone-acetaminophen (NORCO) 5-325 MG tablet Take 1-2 tablets by mouth every 4 (four) hours as needed for moderate pain. Patient not taking: Reported on 03/12/2016 02/14/16   Hayden Pedro, PA-C  ibuprofen (ADVIL,MOTRIN) 200 MG tablet Take 200 mg by mouth every 6 (six) hours as needed for moderate pain. Reported on 12/19/2015    Historical Provider, MD  loperamide (IMODIUM) 2 MG capsule Take 2 mg by mouth as needed for diarrhea or loose stools (Up to 8 times daily, if needed.). Reported on 12/19/2015    Historical Provider, MD  traMADol (ULTRAM) 50 MG tablet Take 1 tablet (50 mg total) by mouth every 6 (six) hours as needed. Patient not taking: Reported on 03/08/2016 02/19/16   Curt Bears, MD  Wound Dressings (SONAFINE EX) Apply 1 application topically daily. 02/16/16   Kyung Rudd, MD      Allergies  Allergen Reactions  . Sulfonamide Derivatives Rash    Childhood reaction    ROS:  Out of a complete 14 system review of symptoms, the patient complains only of the following symptoms, and all other reviewed systems are negative.  Weight loss, fatigue Difficulty swallowing Cough, snoring Feeling cold Confusion, numbness, weakness, possible seizure Sleepiness  Blood pressure 114/79, pulse 89, height '5\' 7"'$  (1.702 m), weight 125 lb 8 oz (56.9 kg).  Physical Exam  General: The patient is alert and cooperative at the time of the examination.  Eyes: Pupils are equal, round, and reactive to light. Discs are flat bilaterally.  Neck: The neck is supple, no  carotid bruits are noted.  Respiratory: The respiratory examination is clear.  Cardiovascular: The cardiovascular examination reveals a regular rate and rhythm, no obvious murmurs or rubs are noted.  Skin: Extremities are without significant edema.  Neurologic Exam  Mental status: The patient is alert and oriented x 3 at the time of the examination. The patient has apparent normal recent and remote memory, with an apparently normal attention span and concentration ability.  Cranial nerves: Facial symmetry is present. There is good sensation of the face to pinprick and soft touch bilaterally. The strength of the facial muscles and the muscles to head turning and shoulder shrug are normal bilaterally. Speech is well enunciated, no aphasia or dysarthria is noted. Extraocular movements are full. Visual fields are full. The tongue is midline, and the patient has symmetric elevation of the soft palate. No obvious hearing deficits are noted.  Motor:  The motor testing reveals 5 over 5 strength of all 4 extremities, with exception of weakness with finger extension, grip, triceps weakness on the left arm. Good symmetric motor tone is noted throughout.  Sensory: Sensory testing is intact to pinprick, soft touch, vibration sensation, and position sense on all 4 extremities. No evidence of extinction is noted.  Coordination: Cerebellar testing reveals good finger-nose-finger and heel-to-shin bilaterally.  Gait and station: Gait is slightly wide-based, unsteady. The patient uses a walker for ambulation. Tandem gait was not attempted. Romberg is negative. No drift is seen.  Reflexes: Deep tendon reflexes are symmetric and normal bilaterally, with exception of a relative increase in the left biceps reflex is compared to the right. Toes are downgoing bilaterally.   MRI brain, MRA head and neck 03/10/16:  IMPRESSION: MRI HEAD: No acute intracranial process.  Interval response of treatment with faint  residual LEFT frontal lobe metastasis. Mild chronic small-vessel  Mild global parenchymal brain volume loss with extensive white matter changes compatible with whole-brain radiation.  MRA HEAD: Negative.  MRA NECK: Negative.  * MRI scan images were reviewed online. I agree with the written report.    Assessment/Plan:  1. Transient speech disturbance, possible seizure  2. Non-small cell lung cancer, metastatic to brain and bone  3. Generalized fatigue, cognitive decline  Over the last 3 weeks, the patient has had onset of left arm weakness, a pervasive change in cognitive functioning with slowness of processing, the patient also reports some headache, and she has had episodes of speech disturbance. MRI of the brain does show radiation changes in the white matter of the brain. A workup for possible carcinomatous meningitis should be undertaken. The patient will undergo lumbar puncture, she will have EEG evaluation. She will remain on Keppra. She will follow-up in 4 months.  Jill Alexanders MD 03/12/2016 10:21 AM  [facility] [address] [place], [place] [address]  Phone [phone] Fax [phone]

## 2016-03-13 ENCOUNTER — Ambulatory Visit: Payer: BC Managed Care – PPO | Admitting: Neurology

## 2016-03-13 ENCOUNTER — Ambulatory Visit (HOSPITAL_BASED_OUTPATIENT_CLINIC_OR_DEPARTMENT_OTHER): Payer: BC Managed Care – PPO

## 2016-03-13 ENCOUNTER — Other Ambulatory Visit (HOSPITAL_BASED_OUTPATIENT_CLINIC_OR_DEPARTMENT_OTHER): Payer: BC Managed Care – PPO

## 2016-03-13 DIAGNOSIS — C3411 Malignant neoplasm of upper lobe, right bronchus or lung: Secondary | ICD-10-CM

## 2016-03-13 DIAGNOSIS — Z5111 Encounter for antineoplastic chemotherapy: Secondary | ICD-10-CM

## 2016-03-13 DIAGNOSIS — Z79899 Other long term (current) drug therapy: Secondary | ICD-10-CM

## 2016-03-13 DIAGNOSIS — Z5112 Encounter for antineoplastic immunotherapy: Secondary | ICD-10-CM

## 2016-03-13 DIAGNOSIS — C7951 Secondary malignant neoplasm of bone: Secondary | ICD-10-CM

## 2016-03-13 LAB — CBC WITH DIFFERENTIAL/PLATELET
BASO%: 0.2 % (ref 0.0–2.0)
BASOS ABS: 0 10*3/uL (ref 0.0–0.1)
EOS ABS: 0 10*3/uL (ref 0.0–0.5)
EOS%: 0.2 % (ref 0.0–7.0)
HCT: 32.1 % — ABNORMAL LOW (ref 34.8–46.6)
HGB: 10.4 g/dL — ABNORMAL LOW (ref 11.6–15.9)
LYMPH%: 0.9 % — AB (ref 14.0–49.7)
MCH: 30.3 pg (ref 25.1–34.0)
MCHC: 32.4 g/dL (ref 31.5–36.0)
MCV: 93.5 fL (ref 79.5–101.0)
MONO#: 0.5 10*3/uL (ref 0.1–0.9)
MONO%: 4.7 % (ref 0.0–14.0)
NEUT#: 9.2 10*3/uL — ABNORMAL HIGH (ref 1.5–6.5)
NEUT%: 94 % — AB (ref 38.4–76.8)
PLATELETS: 120 10*3/uL — AB (ref 145–400)
RBC: 3.43 10*6/uL — ABNORMAL LOW (ref 3.70–5.45)
RDW: 17.9 % — ABNORMAL HIGH (ref 11.2–14.5)
WBC: 9.8 10*3/uL (ref 3.9–10.3)
lymph#: 0.1 10*3/uL — ABNORMAL LOW (ref 0.9–3.3)

## 2016-03-13 LAB — COMPREHENSIVE METABOLIC PANEL
ALBUMIN: 2.7 g/dL — AB (ref 3.5–5.0)
ALK PHOS: 62 U/L (ref 40–150)
ALT: 25 U/L (ref 0–55)
AST: 21 U/L (ref 5–34)
Anion Gap: 8 mEq/L (ref 3–11)
BUN: 42 mg/dL — ABNORMAL HIGH (ref 7.0–26.0)
CO2: 23 mEq/L (ref 22–29)
Calcium: 8.9 mg/dL (ref 8.4–10.4)
Chloride: 108 mEq/L (ref 98–109)
Creatinine: 0.8 mg/dL (ref 0.6–1.1)
EGFR: 85 mL/min/{1.73_m2} — AB (ref >90)
GLUCOSE: 113 mg/dL (ref 70–140)
POTASSIUM: 4.1 meq/L (ref 3.5–5.1)
SODIUM: 139 meq/L (ref 136–145)
Total Bilirubin: 0.51 mg/dL (ref 0.20–1.20)
Total Protein: 6.2 g/dL — ABNORMAL LOW (ref 6.4–8.3)

## 2016-03-13 LAB — TSH: TSH: 0.313 m[IU]/L (ref 0.308–3.960)

## 2016-03-13 MED ORDER — SODIUM CHLORIDE 0.9 % IV SOLN
480.0000 mg/m2 | Freq: Once | INTRAVENOUS | Status: AC
  Administered 2016-03-13: 800 mg via INTRAVENOUS
  Filled 2016-03-13: qty 20

## 2016-03-13 MED ORDER — SODIUM CHLORIDE 0.9 % IV SOLN
Freq: Once | INTRAVENOUS | Status: AC
  Administered 2016-03-13: 14:00:00 via INTRAVENOUS

## 2016-03-13 MED ORDER — DENOSUMAB 120 MG/1.7ML ~~LOC~~ SOLN
120.0000 mg | Freq: Once | SUBCUTANEOUS | Status: AC
  Administered 2016-03-13: 120 mg via SUBCUTANEOUS
  Filled 2016-03-13: qty 1.7

## 2016-03-13 MED ORDER — PALONOSETRON HCL INJECTION 0.25 MG/5ML
INTRAVENOUS | Status: AC
  Filled 2016-03-13: qty 5

## 2016-03-13 MED ORDER — SODIUM CHLORIDE 0.9 % IV SOLN
10.0000 mg | Freq: Once | INTRAVENOUS | Status: AC
  Administered 2016-03-13: 10 mg via INTRAVENOUS
  Filled 2016-03-13: qty 1

## 2016-03-13 MED ORDER — SODIUM CHLORIDE 0.9 % IV SOLN
200.0000 mg | Freq: Once | INTRAVENOUS | Status: AC
  Administered 2016-03-13: 200 mg via INTRAVENOUS
  Filled 2016-03-13: qty 8

## 2016-03-13 MED ORDER — SODIUM CHLORIDE 0.9 % IV SOLN
459.5000 mg | Freq: Once | INTRAVENOUS | Status: AC
  Administered 2016-03-13: 460 mg via INTRAVENOUS
  Filled 2016-03-13: qty 46

## 2016-03-13 MED ORDER — PALONOSETRON HCL INJECTION 0.25 MG/5ML
0.2500 mg | Freq: Once | INTRAVENOUS | Status: AC
  Administered 2016-03-13: 0.25 mg via INTRAVENOUS

## 2016-03-13 NOTE — Patient Instructions (Signed)
[facility] Discharge Instructions for Patients Receiving Chemotherapy  Today you received the following chemotherapy agents: Alimta, Carboplatin, Keytruda  To help prevent nausea and vomiting after your treatment, we encourage you to take your nausea medication: as directed.   If you develop nausea and vomiting that is not controlled by your nausea medication, call the clinic.   BELOW ARE SYMPTOMS THAT SHOULD BE REPORTED IMMEDIATELY:  *FEVER GREATER THAN 100.5 F  *CHILLS WITH OR WITHOUT FEVER  NAUSEA AND VOMITING THAT IS NOT CONTROLLED WITH YOUR NAUSEA MEDICATION  *UNUSUAL SHORTNESS OF BREATH  *UNUSUAL BRUISING OR BLEEDING  TENDERNESS IN MOUTH AND THROAT WITH OR WITHOUT PRESENCE OF ULCERS  *URINARY PROBLEMS  *BOWEL PROBLEMS  UNUSUAL RASH Items with * indicate a potential emergency and should be followed up as soon as possible.  Feel free to call the clinic you have any questions or concerns. The clinic phone number is [phone].  Please show the [facility] at check-in to the Emergency Department and triage nurse.   Pemetrexed injection What is this medicine? PEMETREXED (PEM e TREX ed) is a chemotherapy drug. This medicine affects cells that are rapidly growing, such as cancer cells and cells in your mouth and stomach. It is usually used to treat lung cancers like non-small cell lung cancer and mesothelioma. It may also be used to treat other cancers. This medicine may be used for other purposes; ask your health care provider or pharmacist if you have questions. What should I tell my health care provider before I take this medicine? They need to know if you have any of these conditions: -if you frequently drink alcohol containing beverages -infection (especially a virus infection such as chickenpox, cold sores, or herpes) -kidney disease -liver disease -low blood counts, like low platelets, red bloods, or white blood cells -an unusual or allergic  reaction to pemetrexed, mannitol, other medicines, foods, dyes, or preservatives -pregnant or trying to get pregnant -breast-feeding How should I use this medicine? This drug is given as an infusion into a vein. It is administered in a hospital or clinic by a specially trained health care professional. Talk to your pediatrician regarding the use of this medicine in children. Special care may be needed. Overdosage: If you think you have taken too much of this medicine contact a poison control center or emergency room at once. NOTE: This medicine is only for you. Do not share this medicine with others. What if I miss a dose? It is important not to miss your dose. Call your doctor or health care professional if you are unable to keep an appointment. What may interact with this medicine? -aspirin and aspirin-like medicines -medicines to increase blood counts like filgrastim, pegfilgrastim, sargramostim -methotrexate -NSAIDS, medicines for pain and inflammation, like ibuprofen or naproxen -probenecid -pyrimethamine -vaccines Talk to your doctor or health care professional before taking any of these medicines: -acetaminophen -aspirin -ibuprofen -ketoprofen -naproxen This list may not describe all possible interactions. Give your health care provider a list of all the medicines, herbs, non-prescription drugs, or dietary supplements you use. Also tell them if you smoke, drink alcohol, or use illegal drugs. Some items may interact with your medicine. What should I watch for while using this medicine? Visit your doctor for checks on your progress. This drug may make you feel generally unwell. This is not uncommon, as chemotherapy can affect healthy cells as well as cancer cells. Report any side effects. Continue your course of treatment even though you  feel ill unless your doctor tells you to stop. In some cases, you may be given additional medicines to help with side effects. Follow all directions  for their use. Call your doctor or health care professional for advice if you get a fever, chills or sore throat, or other symptoms of a cold or flu. Do not treat yourself. This drug decreases your body's ability to fight infections. Try to avoid being around people who are sick. This medicine may increase your risk to bruise or bleed. Call your doctor or health care professional if you notice any unusual bleeding. Be careful brushing and flossing your teeth or using a toothpick because you may get an infection or bleed more easily. If you have any dental work done, tell your dentist you are receiving this medicine. Avoid taking products that contain aspirin, acetaminophen, ibuprofen, naproxen, or ketoprofen unless instructed by your doctor. These medicines may hide a fever. Call your doctor or health care professional if you get diarrhea or mouth sores. Do not treat yourself. To protect your kidneys, drink water or other fluids as directed while you are taking this medicine. Men and women must use effective birth control while taking this medicine. You may also need to continue using effective birth control for a time after stopping this medicine. Do not become pregnant while taking this medicine. Tell your doctor right away if you think that you or your partner might be pregnant. There is a potential for serious side effects to an unborn child. Talk to your health care professional or pharmacist for more information. Do not breast-feed an infant while taking this medicine. This medicine may lower sperm counts. What side effects may I notice from receiving this medicine? Side effects that you should report to your doctor or health care professional as soon as possible: -allergic reactions like skin rash, itching or hives, swelling of the face, lips, or tongue -low blood counts - this medicine may decrease the number of white blood cells, red blood cells and platelets. You may be at increased risk for  infections and bleeding. -signs of infection - fever or chills, cough, sore throat, pain or difficulty passing urine -signs of decreased platelets or bleeding - bruising, pinpoint red spots on the skin, black, tarry stools, blood in the urine -signs of decreased red blood cells - unusually weak or tired, fainting spells, lightheadedness -breathing problems, like a dry cough -changes in emotions or moods -chest pain -confusion -diarrhea -high blood pressure -mouth or throat sores or ulcers -pain, swelling, warmth in the leg -pain on swallowing -swelling of the ankles, feet, hands -trouble passing urine or change in the amount of urine -vomiting -yellowing of the eyes or skin Side effects that usually do not require medical attention (report to your doctor or health care professional if they continue or are bothersome): -hair loss -loss of appetite -nausea -stomach upset This list may not describe all possible side effects. Call your doctor for medical advice about side effects. You may report side effects to FDA at 1-800-FDA-1088. Where should I keep my medicine? This drug is given in a hospital or clinic and will not be stored at home. NOTE: This sheet is a summary. It may not cover all possible information. If you have questions about this medicine, talk to your doctor, pharmacist, or health care provider.    2016, Elsevier/Gold Standard. (2008-02-23 13:24:03)   Carboplatin injection What is this medicine? CARBOPLATIN (KAR boe pla tin) is a chemotherapy drug. It targets fast  dividing cells, like cancer cells, and causes these cells to die. This medicine is used to treat ovarian cancer and many other cancers. This medicine may be used for other purposes; ask your health care provider or pharmacist if you have questions. What should I tell my health care provider before I take this medicine? They need to know if you have any of these conditions: -blood disorders -hearing  problems -kidney disease -recent or ongoing radiation therapy -an unusual or allergic reaction to carboplatin, cisplatin, other chemotherapy, other medicines, foods, dyes, or preservatives -pregnant or trying to get pregnant -breast-feeding How should I use this medicine? This drug is usually given as an infusion into a vein. It is administered in a hospital or clinic by a specially trained health care professional. Talk to your pediatrician regarding the use of this medicine in children. Special care may be needed. Overdosage: If you think you have taken too much of this medicine contact a poison control center or emergency room at once. NOTE: This medicine is only for you. Do not share this medicine with others. What if I miss a dose? It is important not to miss a dose. Call your doctor or health care professional if you are unable to keep an appointment. What may interact with this medicine? -medicines for seizures -medicines to increase blood counts like filgrastim, pegfilgrastim, sargramostim -some antibiotics like amikacin, gentamicin, neomycin, streptomycin, tobramycin -vaccines Talk to your doctor or health care professional before taking any of these medicines: -acetaminophen -aspirin -ibuprofen -ketoprofen -naproxen This list may not describe all possible interactions. Give your health care provider a list of all the medicines, herbs, non-prescription drugs, or dietary supplements you use. Also tell them if you smoke, drink alcohol, or use illegal drugs. Some items may interact with your medicine. What should I watch for while using this medicine? Your condition will be monitored carefully while you are receiving this medicine. You will need important blood work done while you are taking this medicine. This drug may make you feel generally unwell. This is not uncommon, as chemotherapy can affect healthy cells as well as cancer cells. Report any side effects. Continue your course  of treatment even though you feel ill unless your doctor tells you to stop. In some cases, you may be given additional medicines to help with side effects. Follow all directions for their use. Call your doctor or health care professional for advice if you get a fever, chills or sore throat, or other symptoms of a cold or flu. Do not treat yourself. This drug decreases your body's ability to fight infections. Try to avoid being around people who are sick. This medicine may increase your risk to bruise or bleed. Call your doctor or health care professional if you notice any unusual bleeding. Be careful brushing and flossing your teeth or using a toothpick because you may get an infection or bleed more easily. If you have any dental work done, tell your dentist you are receiving this medicine. Avoid taking products that contain aspirin, acetaminophen, ibuprofen, naproxen, or ketoprofen unless instructed by your doctor. These medicines may hide a fever. Do not become pregnant while taking this medicine. Women should inform their doctor if they wish to become pregnant or think they might be pregnant. There is a potential for serious side effects to an unborn child. Talk to your health care professional or pharmacist for more information. Do not breast-feed an infant while taking this medicine. What side effects may I notice from receiving  this medicine? Side effects that you should report to your doctor or health care professional as soon as possible: -allergic reactions like skin rash, itching or hives, swelling of the face, lips, or tongue -signs of infection - fever or chills, cough, sore throat, pain or difficulty passing urine -signs of decreased platelets or bleeding - bruising, pinpoint red spots on the skin, black, tarry stools, nosebleeds -signs of decreased red blood cells - unusually weak or tired, fainting spells, lightheadedness -breathing problems -changes in hearing -changes in  vision -chest pain -high blood pressure -low blood counts - This drug may decrease the number of white blood cells, red blood cells and platelets. You may be at increased risk for infections and bleeding. -nausea and vomiting -pain, swelling, redness or irritation at the injection site -pain, tingling, numbness in the hands or feet -problems with balance, talking, walking -trouble passing urine or change in the amount of urine Side effects that usually do not require medical attention (report to your doctor or health care professional if they continue or are bothersome): -hair loss -loss of appetite -metallic taste in the mouth or changes in taste This list may not describe all possible side effects. Call your doctor for medical advice about side effects. You may report side effects to FDA at 1-800-FDA-1088. Where should I keep my medicine? This drug is given in a hospital or clinic and will not be stored at home. NOTE: This sheet is a summary. It may not cover all possible information. If you have questions about this medicine, talk to your doctor, pharmacist, or health care provider.    2016, Elsevier/Gold Standard. (2007-10-27 14:38:05)    Pembrolizumab injection What is this medicine? PEMBROLIZUMAB (pem broe liz ue mab) is a monoclonal antibody. It is used to treat melanoma and non-small cell lung cancer. This medicine may be used for other purposes; ask your health care provider or pharmacist if you have questions. What should I tell my health care provider before I take this medicine? They need to know if you have any of these conditions: -diabetes -immune system problems -inflammatory bowel disease -liver disease -lung or breathing disease -lupus -an unusual or allergic reaction to pembrolizumab, other medicines, foods, dyes, or preservatives -pregnant or trying to get pregnant -breast-feeding How should I use this medicine? This medicine is for infusion into a vein. It  is given by a health care professional in a hospital or clinic setting. A special MedGuide will be given to you before each treatment. Be sure to read this information carefully each time. Talk to your pediatrician regarding the use of this medicine in children. Special care may be needed. Overdosage: If you think you have taken too much of this medicine contact a poison control center or emergency room at once. NOTE: This medicine is only for you. Do not share this medicine with others. What if I miss a dose? It is important not to miss your dose. Call your doctor or health care professional if you are unable to keep an appointment. What may interact with this medicine? Interactions have not been studied. Give your health care provider a list of all the medicines, herbs, non-prescription drugs, or dietary supplements you use. Also tell them if you smoke, drink alcohol, or use illegal drugs. Some items may interact with your medicine. This list may not describe all possible interactions. Give your health care provider a list of all the medicines, herbs, non-prescription drugs, or dietary supplements you use. Also tell them if  you smoke, drink alcohol, or use illegal drugs. Some items may interact with your medicine. What should I watch for while using this medicine? Your condition will be monitored carefully while you are receiving this medicine. You may need blood work done while you are taking this medicine. Do not become pregnant while taking this medicine or for 4 months after stopping it. Women should inform their doctor if they wish to become pregnant or think they might be pregnant. There is a potential for serious side effects to an unborn child. Talk to your health care professional or pharmacist for more information. Do not breast-feed an infant while taking this medicine or for 4 months after the last dose. What side effects may I notice from receiving this medicine? Side effects that you  should report to your doctor or health care professional as soon as possible: -allergic reactions like skin rash, itching or hives, swelling of the face, lips, or tongue -bloody or black, tarry stools -breathing problems -change in the amount of urine -changes in vision -chest pain -chills -dark urine -dizziness or feeling faint or lightheaded -fast or irregular heartbeat -fever -flushing -hair loss -muscle pain -muscle weakness -persistent headache -signs and symptoms of high blood sugar such as dizziness; dry mouth; dry skin; fruity breath; nausea; stomach pain; increased hunger or thirst; increased urination -signs and symptoms of liver injury like dark urine, light-colored stools, loss of appetite, nausea, right upper belly pain, yellowing of the eyes or skin -stomach pain -weight loss Side effects that usually do not require medical attention (Report these to your doctor or health care professional if they continue or are bothersome.):constipation -cough -diarrhea -joint pain -tiredness This list may not describe all possible side effects. Call your doctor for medical advice about side effects. You may report side effects to FDA at 1-800-FDA-1088. Where should I keep my medicine? This drug is given in a hospital or clinic and will not be stored at home. NOTE: This sheet is a summary. It may not cover all possible information. If you have questions about this medicine, talk to your doctor, pharmacist, or health care provider.    2016, Elsevier/Gold Standard. (2014-09-20 17:24:19)

## 2016-03-14 ENCOUNTER — Telehealth: Payer: Self-pay | Admitting: *Deleted

## 2016-03-14 NOTE — Telephone Encounter (Signed)
TC to patient for chemo f/u call. Reached patient's vm only and unable to leave message as mailbox is full. Call made to Laura Walter, pt's partner. Spoke with her and she states that Laura Walter is doing actually a bit better last night and today. Appetite good, drinking fluids fairly well with additional water via feeding tube. Denies nausea, fever. Slept well last night and Laura Walter states that Laura Walter is actually a bit more alert and wakeful today. 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 Reviewed meds. All questions answered regarding steroids and nausea meds (Zyprexa). Zyprexa is working very well for patient. She takes at night. This is the only anti-emetic that has worked for patient.  Reinforced use of [place] # for bonnie to call with any questions or concerns.

## 2016-03-14 NOTE — Telephone Encounter (Signed)
[facility] patient for chemotherapy F/U and received voicemail.  Unable to leave message.  Received electronic message that "unable to leave message, voicemail box is full."

## 2016-03-14 NOTE — Telephone Encounter (Signed)
-----   Message from Otila Kluver, RN sent at 03/13/2016  5:39 PM EDT ----- Regarding: Laura Walter 1st time chemo Contact: [phone] 1st time chemo-Keytruda, Alimta and Carboplatin.  Tolerated well without reactions during infusion.

## 2016-03-14 NOTE — Telephone Encounter (Signed)
-----   Message from Otila Kluver, RN sent at 03/13/2016  5:39 PM EDT ----- Regarding: mohamed 1st time chemo Contact: [phone] 1st time chemo-Keytruda, Alimta and Carboplatin.  Tolerated well without reactions during infusion.

## 2016-03-15 NOTE — Progress Notes (Signed)
  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     Indication for treatment:  Palliative       Radiation treatment dates:  02/15/2016 to 03/04/2016  Site/dose:    1. T8 - T10 was treated to 30 Gy in 10 fractions at 3 Gy per fraction. 2. The Right upper lobe 2.7 cm was treated to 30 Gy in 12 fractions at 2.5 Gy per fraction.  3. The Left frontal lobe 5.3 mm was treated to 20 Gy in 1 fraction.  4. The cervical spine was treated to 20 Gy in 4 fractions at 5 Gy per fraction.  Beams/energy:    1. Isodose plan // 10X, 6X 2. 3D // 10X, 6X 3. SBRT/SRT-3D // 6FFF 4. Isodose plan // 6X  Narrative: The patient tolerated radiation treatment relatively well.  The patient complained of pain 3/10 in her bilateral posterior upper arms. Her motor strength was 5-/-5 with some grip weakness on the left. She did not experience any skin irritation.  She denied shortness of breath, but did experience very little coughing.  Plan: The patient has completed radiation treatment. The patient will return to radiation oncology clinic for routine followup in one month. I advised them to call or return sooner if they have any questions or concerns related to their recovery or treatment.  ------------------------------------------------  Jodelle Gross, MD, PhD  This document serves as a record of services personally performed by Kyung Rudd, MD. It was created on his behalf by Arlyce Harman, a trained medical scribe. The creation of this record is based on the scribe's personal observations and the provider's statements to them. This document has been checked and approved by the attending provider.

## 2016-03-18 ENCOUNTER — Encounter: Payer: Self-pay | Admitting: *Deleted

## 2016-03-18 NOTE — Progress Notes (Signed)
Oncology Nurse Navigator Documentation  Oncology Nurse Navigator Flowsheets 03/18/2016  Navigator Encounter Type Telephone/patient's partner called.  She wanted to know about molecular results. They are not back and I updated her on that.  She stated Deborha is congested at night and sitting in a chair helps.  I stated she was doing a great job taking care of Leonetta and sitting in the chair at night was the best thing to do.  I also recommended Mucin ex to help with congestion.  I updated Dr. Julien Nordmann.    Telephone Incoming Call  Treatment Phase Treatment  Barriers/Navigation Needs Education  Education Other  Interventions Education Method  Education Method Verbal  Acuity Level 2  Acuity Level 2 Educational needs  Time Spent with Patient 30

## 2016-03-19 ENCOUNTER — Ambulatory Visit: Payer: BC Managed Care – PPO

## 2016-03-19 ENCOUNTER — Other Ambulatory Visit: Payer: BC Managed Care – PPO

## 2016-03-19 ENCOUNTER — Ambulatory Visit: Payer: BC Managed Care – PPO | Admitting: Internal Medicine

## 2016-03-20 ENCOUNTER — Other Ambulatory Visit (HOSPITAL_BASED_OUTPATIENT_CLINIC_OR_DEPARTMENT_OTHER): Payer: BC Managed Care – PPO

## 2016-03-20 DIAGNOSIS — C3411 Malignant neoplasm of upper lobe, right bronchus or lung: Secondary | ICD-10-CM

## 2016-03-20 DIAGNOSIS — Z5111 Encounter for antineoplastic chemotherapy: Secondary | ICD-10-CM

## 2016-03-20 DIAGNOSIS — C7951 Secondary malignant neoplasm of bone: Secondary | ICD-10-CM | POA: Diagnosis not present

## 2016-03-20 LAB — COMPREHENSIVE METABOLIC PANEL
ALBUMIN: 2.3 g/dL — AB (ref 3.5–5.0)
ALK PHOS: 60 U/L (ref 40–150)
ALT: 34 U/L (ref 0–55)
AST: 30 U/L (ref 5–34)
Anion Gap: 9 mEq/L (ref 3–11)
BUN: 29.5 mg/dL — AB (ref 7.0–26.0)
CALCIUM: 8.8 mg/dL (ref 8.4–10.4)
CO2: 27 mEq/L (ref 22–29)
Chloride: 105 mEq/L (ref 98–109)
Creatinine: 0.8 mg/dL (ref 0.6–1.1)
EGFR: 74 mL/min/{1.73_m2} — AB (ref >90)
Glucose: 140 mg/dl (ref 70–140)
POTASSIUM: 3.6 meq/L (ref 3.5–5.1)
Sodium: 141 mEq/L (ref 136–145)
Total Bilirubin: 0.41 mg/dL (ref 0.20–1.20)
Total Protein: 6 g/dL — ABNORMAL LOW (ref 6.4–8.3)

## 2016-03-20 LAB — CBC WITH DIFFERENTIAL/PLATELET
BASO%: 0 % (ref 0.0–2.0)
BASOS ABS: 0 10*3/uL (ref 0.0–0.1)
EOS%: 2.1 % (ref 0.0–7.0)
Eosinophils Absolute: 0.1 10*3/uL (ref 0.0–0.5)
HCT: 25.7 % — ABNORMAL LOW (ref 34.8–46.6)
HEMOGLOBIN: 8.3 g/dL — AB (ref 11.6–15.9)
LYMPH#: 0.2 10*3/uL — AB (ref 0.9–3.3)
LYMPH%: 6.8 % — ABNORMAL LOW (ref 14.0–49.7)
MCH: 30 pg (ref 25.1–34.0)
MCHC: 32.3 g/dL (ref 31.5–36.0)
MCV: 92.8 fL (ref 79.5–101.0)
MONO#: 0.1 10*3/uL (ref 0.1–0.9)
MONO%: 2.6 % (ref 0.0–14.0)
NEUT#: 2.1 10*3/uL (ref 1.5–6.5)
NEUT%: 88.5 % — AB (ref 38.4–76.8)
Platelets: 50 10*3/uL — ABNORMAL LOW (ref 145–400)
RBC: 2.77 10*6/uL — ABNORMAL LOW (ref 3.70–5.45)
RDW: 16.1 % — ABNORMAL HIGH (ref 11.2–14.5)
WBC: 2.4 10*3/uL — ABNORMAL LOW (ref 3.9–10.3)
nRBC: 0 % (ref 0–0)

## 2016-03-25 ENCOUNTER — Other Ambulatory Visit: Payer: BC Managed Care – PPO

## 2016-03-25 ENCOUNTER — Ambulatory Visit: Payer: BC Managed Care – PPO | Admitting: Internal Medicine

## 2016-03-27 ENCOUNTER — Other Ambulatory Visit: Payer: Self-pay | Admitting: Pharmacist

## 2016-03-27 ENCOUNTER — Ambulatory Visit (HOSPITAL_BASED_OUTPATIENT_CLINIC_OR_DEPARTMENT_OTHER): Payer: BC Managed Care – PPO

## 2016-03-27 ENCOUNTER — Telehealth: Payer: Self-pay | Admitting: *Deleted

## 2016-03-27 ENCOUNTER — Ambulatory Visit: Payer: BC Managed Care – PPO

## 2016-03-27 ENCOUNTER — Other Ambulatory Visit: Payer: Self-pay | Admitting: *Deleted

## 2016-03-27 ENCOUNTER — Other Ambulatory Visit (HOSPITAL_BASED_OUTPATIENT_CLINIC_OR_DEPARTMENT_OTHER): Payer: BC Managed Care – PPO

## 2016-03-27 ENCOUNTER — Encounter: Payer: Self-pay | Admitting: *Deleted

## 2016-03-27 ENCOUNTER — Telehealth: Payer: Self-pay | Admitting: Internal Medicine

## 2016-03-27 ENCOUNTER — Ambulatory Visit (HOSPITAL_COMMUNITY)
Admission: RE | Admit: 2016-03-27 | Discharge: 2016-03-27 | Disposition: A | Discharge status: Home / Self Care | Payer: BC Managed Care – PPO | Source: Ambulatory Visit | Attending: Internal Medicine | Admitting: Internal Medicine

## 2016-03-27 ENCOUNTER — Other Ambulatory Visit (HOSPITAL_COMMUNITY)
Admission: RE | Admit: 2016-03-27 | Discharge: 2016-03-27 | Disposition: A | Discharge status: Home / Self Care | Payer: BC Managed Care – PPO | Source: Ambulatory Visit | Attending: Internal Medicine | Admitting: Internal Medicine

## 2016-03-27 DIAGNOSIS — C7951 Secondary malignant neoplasm of bone: Secondary | ICD-10-CM | POA: Diagnosis not present

## 2016-03-27 DIAGNOSIS — C3411 Malignant neoplasm of upper lobe, right bronchus or lung: Secondary | ICD-10-CM

## 2016-03-27 DIAGNOSIS — D649 Anemia, unspecified: Secondary | ICD-10-CM

## 2016-03-27 DIAGNOSIS — C3491 Malignant neoplasm of unspecified part of right bronchus or lung: Secondary | ICD-10-CM | POA: Insufficient documentation

## 2016-03-27 DIAGNOSIS — Z5111 Encounter for antineoplastic chemotherapy: Secondary | ICD-10-CM

## 2016-03-27 LAB — CBC WITH DIFFERENTIAL/PLATELET
BASO%: 0 % (ref 0.0–2.0)
Basophils Absolute: 0 10*3/uL (ref 0.0–0.1)
EOS ABS: 0 10*3/uL (ref 0.0–0.5)
EOS%: 1.4 % (ref 0.0–7.0)
HCT: 21.7 % — ABNORMAL LOW (ref 34.8–46.6)
HGB: 7 g/dL — ABNORMAL LOW (ref 11.6–15.9)
LYMPH%: 10.1 % — AB (ref 14.0–49.7)
MCH: 29.9 pg (ref 25.1–34.0)
MCHC: 32.3 g/dL (ref 31.5–36.0)
MCV: 92.7 fL (ref 79.5–101.0)
MONO#: 0.1 10*3/uL (ref 0.1–0.9)
MONO%: 8 % (ref 0.0–14.0)
NEUT%: 80.5 % — ABNORMAL HIGH (ref 38.4–76.8)
NEUTROS ABS: 1.1 10*3/uL — AB (ref 1.5–6.5)
PLATELETS: 18 10*3/uL — AB (ref 145–400)
RBC: 2.34 10*6/uL — AB (ref 3.70–5.45)
RDW: 16 % — ABNORMAL HIGH (ref 11.2–14.5)
WBC: 1.4 10*3/uL — AB (ref 3.9–10.3)
lymph#: 0.1 10*3/uL — ABNORMAL LOW (ref 0.9–3.3)
nRBC: 0 % (ref 0–0)

## 2016-03-27 LAB — COMPREHENSIVE METABOLIC PANEL
ALK PHOS: 71 U/L (ref 40–150)
ALT: 51 U/L (ref 0–55)
AST: 29 U/L (ref 5–34)
Albumin: 2.3 g/dL — ABNORMAL LOW (ref 3.5–5.0)
Anion Gap: 10 mEq/L (ref 3–11)
BILIRUBIN TOTAL: 0.43 mg/dL (ref 0.20–1.20)
BUN: 23.6 mg/dL (ref 7.0–26.0)
CHLORIDE: 107 meq/L (ref 98–109)
CO2: 26 meq/L (ref 22–29)
CREATININE: 0.8 mg/dL (ref 0.6–1.1)
Calcium: 8.7 mg/dL (ref 8.4–10.4)
EGFR: 85 mL/min/{1.73_m2} — AB (ref >90)
Glucose: 164 mg/dl — ABNORMAL HIGH (ref 70–140)
Potassium: 3.5 mEq/L (ref 3.5–5.1)
Sodium: 143 mEq/L (ref 136–145)
Total Protein: 6 g/dL — ABNORMAL LOW (ref 6.4–8.3)

## 2016-03-27 LAB — PREPARE RBC (CROSSMATCH)

## 2016-03-27 LAB — ABO/RH: ABO/RH(D): B NEG

## 2016-03-27 MED ORDER — ACETAMINOPHEN 325 MG PO TABS
ORAL_TABLET | ORAL | Status: AC
  Filled 2016-03-27: qty 2

## 2016-03-27 MED ORDER — DIPHENHYDRAMINE HCL 25 MG PO CAPS
ORAL_CAPSULE | ORAL | Status: AC
  Filled 2016-03-27: qty 2

## 2016-03-27 MED ORDER — SODIUM CHLORIDE 0.9 % IV SOLN
250.0000 mL | Freq: Once | INTRAVENOUS | Status: AC
  Administered 2016-03-27: 250 mL via INTRAVENOUS

## 2016-03-27 MED ORDER — ACETAMINOPHEN 325 MG PO TABS
650.0000 mg | ORAL_TABLET | Freq: Once | ORAL | Status: AC
  Administered 2016-03-27: 650 mg via ORAL

## 2016-03-27 MED ORDER — DIPHENHYDRAMINE HCL 25 MG PO CAPS
25.0000 mg | ORAL_CAPSULE | Freq: Once | ORAL | Status: AC
  Administered 2016-03-27: 25 mg via ORAL

## 2016-03-27 NOTE — Patient Instructions (Signed)
Platelet Transfusion, Care After Refer to this sheet in the next few weeks. These instructions provide you with information about caring for yourself after your procedure. Your health care provider may also give you more specific instructions. Your treatment has been planned according to current medical practices, but problems sometimes occur. Call your health care provider if you have any problems or questions after your procedure.  WHAT TO EXPECT AFTER THE PROCEDURE After your procedure, it is common to have:   Bruising and soreness at the IV site.   Fever or chills within the first 48 hours of your transfusion. HOME CARE INSTRUCTIONS  Take medicines only as directed by your health care provider. Ask your health care provider if you can take an over-the-counter pain reliever in case you have a fever or headache a day or two after your transfusion.   Return to your normal activities as directed by your health care provider.  SEEK MEDICAL CARE IF:  You have a fever.  You have a headache.  You have redness, swelling, or pain at your IV site.  You have skin itching or a rash.  You vomit.  You feel unusually tired or weak. SEEK IMMEDIATE MEDICAL CARE IF:   You have trouble breathing.  You have a decreased amount of urine or you urinate less often than you normally do.  Your urine is darker than normal.  You have pain in your back, abdomen, or chest.  You have cool, clammy skin.  You have a rapid heartbeat.   This information is not intended to replace advice given to you by your health care provider. Make sure you discuss any questions you have with your health care provider.   Document Released: 08/12/2014 Document Reviewed: 08/12/2014 Elsevier Interactive Patient Education 2016 [place]. Blood Transfusion, Care After These instructions give you information about caring for yourself after your procedure. Your doctor may also give you more specific instructions. Call  your doctor if you have any problems or questions after your procedure.  HOME CARE  Take medicines only as told by your doctor. Ask your doctor if you can take an over-the-counter pain reliever if you have a fever or headache a day or two after your procedure.  Return to your normal activities as told by your doctor. GET HELP IF:   You develop redness or irritation at your IV site.  You have a fever, chills, or a headache that does not go away.  Your pee (urine) is darker than normal.  Your urine turns:  Pink.  Red.  [place]ark.  The white part of your eye turns yellow (jaundice).  You feel weak after doing your normal activities. GET HELP RIGHT AWAY IF:   You have trouble breathing.  You have fever and chills and you also have:  Anxiety.  Chest or back pain.  Flushed or pink skin.  Clammy or sweaty skin.  A fast heartbeat.  A sick feeling in your stomach (nausea).   This information is not intended to replace advice given to you by your health care provider. Make sure you discuss any questions you have with your health care provider.   Document Released: 08/12/2014 Document Reviewed: 08/12/2014 Elsevier Interactive Patient Education [employer].

## 2016-03-27 NOTE — Progress Notes (Signed)
Labs reviewed with MD, pt will need blood and platelets. Spoke with Agricultural consultant appt for 12:15 infusion. Message to scheduling for lab appt type and cross/

## 2016-03-27 NOTE — Telephone Encounter (Signed)
Oncology Nurse Navigator Documentation  Oncology Nurse Navigator Flowsheets 03/27/2016  Navigator Location -  Navigator Encounter Type Telephone/Laura Walter called and asked that I call her.  I did.  She was asking about why her "counts" were so low and what were next steps.  I explained that chemo can effect her blood counts and lower them.  I stated the blood and plt she received today will help her.  I explained we will get labs every week to see how she recovers.  She is worried that [name] is progressing and is concerned. I listened as she expressed her feelings.  I offered her to speak with CSW or [facility] to help with her feelings.  She is not interested at this time.   Telephone Outgoing Call  Treatment Phase Treatment  Barriers/Navigation Needs Education  Education Other  Interventions Education Method  Education Method Verbal  Time Spent with Patient 30

## 2016-03-27 NOTE — Telephone Encounter (Signed)
APPT SCHD GIVEN TO PATIENT PER 08/23 SCHD MSG.

## 2016-03-27 NOTE — Progress Notes (Signed)
Oncology Nurse Navigator Documentation  Oncology Nurse Navigator Flowsheets 03/27/2016  Navigator Location -  Navigator Encounter Type Other;Telephone/I noted patient's labs today.  Her H and H as well as plt are low today.  I updated Dr. Julien Nordmann.  He ordered 2 units blood and 1 pack platelets.  I called and updated her partner [name].  I asked that they come back to cancer center for blood and platelets.  They are coming back now.    Telephone Outgoing Call  Treatment Phase Treatment  Barriers/Navigation Needs Coordination of Care  Interventions Coordination of Care  Coordination of Care Appts;Other  Acuity Level 2  Acuity Level 2 Assistance expediting appointments;Other  Time Spent with Patient 30

## 2016-03-28 ENCOUNTER — Telehealth: Payer: Self-pay | Admitting: *Deleted

## 2016-03-28 LAB — PREPARE PLATELET PHERESIS: Unit division: 0

## 2016-03-28 NOTE — Telephone Encounter (Signed)
Oncology Nurse Navigator Documentation  Oncology Nurse Navigator Flowsheets 03/28/2016  Navigator Encounter Type Telephone/I called Laura Walter to check on her today.  I was unable to reach nor leave a vm message.    Telephone Outgoing Call  Treatment Phase Treatment  Barriers/Navigation Needs Education  Education Other  Interventions Education Method  Acuity Level 1  Time Spent with Patient 15

## 2016-03-29 ENCOUNTER — Ambulatory Visit (HOSPITAL_BASED_OUTPATIENT_CLINIC_OR_DEPARTMENT_OTHER): Payer: BC Managed Care – PPO

## 2016-03-29 ENCOUNTER — Other Ambulatory Visit: Payer: Self-pay | Admitting: Medical Oncology

## 2016-03-29 DIAGNOSIS — D649 Anemia, unspecified: Secondary | ICD-10-CM

## 2016-03-29 LAB — TYPE AND SCREEN
ABO/RH(D): B NEG
Antibody Screen: NEGATIVE
UNIT DIVISION: 0
UNIT DIVISION: 0

## 2016-03-29 LAB — PREPARE RBC (CROSSMATCH)

## 2016-03-29 MED ORDER — ACETAMINOPHEN 325 MG PO TABS
ORAL_TABLET | ORAL | Status: AC
  Filled 2016-03-29: qty 2

## 2016-03-29 MED ORDER — HEPARIN SOD (PORK) LOCK FLUSH 100 UNIT/ML IV SOLN
250.0000 [IU] | INTRAVENOUS | Status: DC | PRN
  Filled 2016-03-29: qty 5

## 2016-03-29 MED ORDER — SODIUM CHLORIDE 0.9 % IV SOLN: 250.0000 mL | Freq: Once | INTRAVENOUS | Status: DC

## 2016-03-29 MED ORDER — DIPHENHYDRAMINE HCL 25 MG PO CAPS: 25.0000 mg | ORAL_CAPSULE | Freq: Once | ORAL | Status: DC

## 2016-03-29 MED ORDER — ACETAMINOPHEN 325 MG PO TABS
650.0000 mg | ORAL_TABLET | Freq: Once | ORAL | Status: AC
  Administered 2016-03-29: 650 mg via ORAL

## 2016-03-29 MED ORDER — SODIUM CHLORIDE 0.9% FLUSH
3.0000 mL | INTRAVENOUS | Status: DC | PRN
  Filled 2016-03-29: qty 10

## 2016-03-29 MED ORDER — ACETAMINOPHEN 325 MG PO TABS: 650.0000 mg | ORAL_TABLET | Freq: Once | ORAL | Status: DC

## 2016-03-29 MED ORDER — DIPHENHYDRAMINE HCL 25 MG PO CAPS
ORAL_CAPSULE | ORAL | Status: AC
  Filled 2016-03-29: qty 1

## 2016-03-29 MED ORDER — SODIUM CHLORIDE 0.9% FLUSH
10.0000 mL | INTRAVENOUS | Status: DC | PRN
  Filled 2016-03-29: qty 10

## 2016-03-29 MED ORDER — DIPHENHYDRAMINE HCL 25 MG PO CAPS
25.0000 mg | ORAL_CAPSULE | Freq: Once | ORAL | Status: AC
  Administered 2016-03-29: 25 mg via ORAL

## 2016-03-29 NOTE — Patient Instructions (Signed)
Blood Transfusion  A blood transfusion is a procedure in which you receive donated blood through an IV tube. You may need a blood transfusion because of illness, surgery, or injury. The blood may come from a donor, or it may be your own blood that you donated previously. The blood given in a transfusion is made up of different types of cells. You may receive:  Red blood cells. These carry oxygen and replace lost blood.  Platelets. These control bleeding.  Plasma. Thishelps blood to clot. If you have hemophilia or another clotting disorder, you may also receive other types of blood products. LET [facility] CARE PROVIDER KNOW ABOUT:  Any allergies you have.  All medicines you are taking, including vitamins, herbs, eye drops, creams, and over-the-counter medicines.  Previous problems you or members of your family have had with the use of anesthetics.  Any blood disorders you have.  Previous surgeries you have had.  Any medical conditions you may have.  Any previous reactions you have had during a blood transfusion.  RISKS AND COMPLICATIONS Generally, this is a safe procedure. However, problems may occur, including:  Having an allergic reaction to something in the donated blood.  Fever. This may be a reaction to the white blood cells in the transfused blood.  Iron overload. This can happen from having many transfusions.  Transfusion-related acute lung injury (TRALI). This is a rare reaction that causes lung damage. The cause is not known.TRALI can occur within hours of a transfusion or several days later.  Sudden (acute) or delayed hemolytic reactions. This happens if your blood does not match the cells in your transfusion. Your body's defense system (immune system) may try to attack the new cells. This complication is rare.  Infection. This is rare. BEFORE THE PROCEDURE  You may have a blood test to determine your blood type. This is necessary to know what kind of blood your  body will accept.  If you are going to have a planned surgery, you may donate your own blood. This may be done in case you need to have a transfusion.  If you have had an allergic reaction to a transfusion in the past, you may be given medicine to help prevent a reaction. Take this medicine only as directed by your health care provider.  You will have your temperature, blood pressure, and pulse monitored before the transfusion. PROCEDURE   An IV will be started in your hand or arm.  The bag of donated blood will be attached to your IV tube and given into your vein.  Your temperature, blood pressure, and pulse will be monitored regularly during the transfusion. This monitoring is done to detect early signs of a transfusion reaction.  If you have any signs or symptoms of a reaction, your transfusion will be stopped and you may be given medicine.  When the transfusion is over, your IV will be removed.  Pressure may be applied to the IV site for a few minutes.  A bandage (dressing) will be applied. The procedure may vary among health care providers and hospitals. AFTER THE PROCEDURE  Your blood pressure, temperature, and pulse will be monitored regularly.   This information is not intended to replace advice given to you by your health care provider. Make sure you discuss any questions you have with your health care provider.   Document Released: 07/19/2000 Document Revised: 08/12/2014 Document Reviewed: 06/01/2014 Elsevier Interactive Patient Education 2016 [employer]   Dehydration, Adult Dehydration is a condition  in which you do not have enough fluid or water in your body. It happens when you take in less fluid than you lose. Vital organs such as the kidneys, brain, and heart cannot function without a proper amount of fluids. Any loss of fluids from the body can cause dehydration.  Dehydration can range from mild to severe. This condition should be treated right away to help  prevent it from becoming severe. CAUSES  This condition may be caused by:  Vomiting.  Diarrhea.  Excessive sweating, such as when exercising in hot or humid weather.  Not drinking enough fluid during strenuous exercise or during an illness.  Excessive urine output.  Fever.  Certain medicines. RISK FACTORS This condition is more likely to develop in:  People who are taking certain medicines that cause the body to lose excess fluid (diuretics).   People who have a chronic illness, such as diabetes, that may increase urination.  Older adults.   People who live at high altitudes.   People who participate in endurance sports.  SYMPTOMS  Mild Dehydration  Thirst.  Dry lips.  Slightly dry mouth.  Dry, warm skin. Moderate Dehydration  Very dry mouth.   Muscle cramps.   Dark urine and decreased urine production.   Decreased tear production.   Headache.   Light-headedness, especially when you stand up from a sitting position.  Severe Dehydration  Changes in skin.   Cold and clammy skin.   Skin does not spring back quickly when lightly pinched and released.   Changes in body fluids.   Extreme thirst.   No tears.   Not able to sweat when body temperature is high, such as in hot weather.   Minimal urine production.   Changes in vital signs.   Rapid, weak pulse (more than 100 beats per minute when you are sitting still).   Rapid breathing.   Low blood pressure.   Other changes.   Sunken eyes.   Cold hands and feet.   Confusion.  Lethargy and difficulty being awakened.  Fainting (syncope).   Short-term weight loss.   Unconsciousness. DIAGNOSIS  This condition may be diagnosed based on your symptoms. You may also have tests to determine how severe your dehydration is. These tests may include:   Urine tests.   Blood tests.  TREATMENT  Treatment for this condition depends on the severity. Mild or moderate  dehydration can often be treated at home. Treatment should be started right away. Do not wait until dehydration becomes severe. Severe dehydration needs to be treated at the hospital. Treatment for Mild Dehydration  Drinking plenty of water to replace the fluid you have lost.   Replacing minerals in your blood (electrolytes) that you may have lost.  Treatment for Moderate Dehydration  Consuming oral rehydration solution (ORS). Treatment for Severe Dehydration  Receiving fluid through an IV tube.   Receiving electrolyte solution through a feeding tube that is passed through your nose and into your stomach (nasogastric tube or NG tube).  Correcting any abnormalities in electrolytes. HOME CARE INSTRUCTIONS   Drink enough fluid to keep your urine clear or pale yellow.   Drink water or fluid slowly by taking small sips. You can also try sucking on ice cubes.  Have food or beverages that contain electrolytes. Examples include bananas and sports drinks.  Take over-the-counter and prescription medicines only as told by your health care provider.   Prepare ORS according to the manufacturer's instructions. Take sips of ORS every 5 minutes  until your urine returns to normal.  If you have vomiting or diarrhea, continue to try to drink water, ORS, or both.   If you have diarrhea, avoid:   Beverages that contain caffeine.   Fruit juice.   Milk.   Carbonated soft drinks.  Do not take salt tablets. This can lead to the condition of having too much sodium in your body (hypernatremia).  SEEK MEDICAL CARE IF:  You cannot eat or drink without vomiting.  You have had moderate diarrhea during a period of more than 24 hours.  You have a fever. SEEK IMMEDIATE MEDICAL CARE IF:   You have extreme thirst.  You have severe diarrhea.  You have not urinated in 6-8 hours, or you have urinated only a small amount of very dark urine.  You have shriveled skin.  You are dizzy,  confused, or both.   This information is not intended to replace advice given to you by your health care provider. Make sure you discuss any questions you have with your health care provider.   Document Released: 07/22/2005 Document Revised: 04/12/2015 Document Reviewed: 12/07/2014 Elsevier Interactive Patient Education [employer].

## 2016-03-29 NOTE — Progress Notes (Signed)
Pt arrived to infusion at 1427. After IV access noticed that pt was no longer wearing blood bank bracelet from two days prior. Involved contacting blood bank, lab, and increased pt wait time. New type and screen performed. Lab and blood bank aware. Explained to pt that blue blood bracelets are good for three days, and to leave them on until that time has elapsed in the event of infusion. Pt verbalized understanding and commented that she had not been told to keep the bracelet on at her last visit.

## 2016-03-29 NOTE — Progress Notes (Signed)
1815- Pt done with 1 unit prbc. Pt vss upon discahrge. Told pt to keep her blue blood bracelet band on until she comes back for labs next week. Pt verbalized understanding and will make sure that she keeps it on.

## 2016-04-01 LAB — TYPE AND SCREEN
ABO/RH(D): B NEG
ANTIBODY SCREEN: NEGATIVE
UNIT DIVISION: 0

## 2016-04-03 ENCOUNTER — Encounter: Payer: Self-pay | Admitting: *Deleted

## 2016-04-03 ENCOUNTER — Encounter: Payer: Self-pay | Admitting: Internal Medicine

## 2016-04-03 ENCOUNTER — Ambulatory Visit: Payer: BC Managed Care – PPO

## 2016-04-03 ENCOUNTER — Telehealth: Payer: Self-pay | Admitting: Internal Medicine

## 2016-04-03 ENCOUNTER — Ambulatory Visit (HOSPITAL_BASED_OUTPATIENT_CLINIC_OR_DEPARTMENT_OTHER): Payer: BC Managed Care – PPO | Admitting: Internal Medicine

## 2016-04-03 ENCOUNTER — Ambulatory Visit
Admission: RE | Admit: 2016-04-03 | Discharge: 2016-04-03 | Disposition: A | Discharge status: Home / Self Care | Payer: BC Managed Care – PPO | Source: Ambulatory Visit | Attending: Radiation Oncology | Admitting: Radiation Oncology

## 2016-04-03 ENCOUNTER — Encounter: Payer: Self-pay | Admitting: Radiation Oncology

## 2016-04-03 ENCOUNTER — Other Ambulatory Visit (HOSPITAL_BASED_OUTPATIENT_CLINIC_OR_DEPARTMENT_OTHER): Payer: BC Managed Care – PPO

## 2016-04-03 VITALS — BP 110/77 | HR 109 | Temp 97.8°F | Resp 20 | Ht 67.0 in | Wt 131.3 lb

## 2016-04-03 DIAGNOSIS — C7931 Secondary malignant neoplasm of brain: Secondary | ICD-10-CM

## 2016-04-03 DIAGNOSIS — Z5111 Encounter for antineoplastic chemotherapy: Secondary | ICD-10-CM

## 2016-04-03 DIAGNOSIS — C3490 Malignant neoplasm of unspecified part of unspecified bronchus or lung: Secondary | ICD-10-CM | POA: Insufficient documentation

## 2016-04-03 DIAGNOSIS — Z79811 Long term (current) use of aromatase inhibitors: Secondary | ICD-10-CM

## 2016-04-03 DIAGNOSIS — D630 Anemia in neoplastic disease: Secondary | ICD-10-CM | POA: Diagnosis not present

## 2016-04-03 DIAGNOSIS — R110 Nausea: Secondary | ICD-10-CM

## 2016-04-03 DIAGNOSIS — C3411 Malignant neoplasm of upper lobe, right bronchus or lung: Secondary | ICD-10-CM

## 2016-04-03 DIAGNOSIS — C7951 Secondary malignant neoplasm of bone: Secondary | ICD-10-CM

## 2016-04-03 DIAGNOSIS — E860 Dehydration: Secondary | ICD-10-CM

## 2016-04-03 DIAGNOSIS — G893 Neoplasm related pain (acute) (chronic): Secondary | ICD-10-CM

## 2016-04-03 LAB — CBC WITH DIFFERENTIAL/PLATELET
BASO%: 0.4 % (ref 0.0–2.0)
Basophils Absolute: 0 10*3/uL (ref 0.0–0.1)
EOS ABS: 0 10*3/uL (ref 0.0–0.5)
EOS%: 0.5 % (ref 0.0–7.0)
HCT: 30.3 % — ABNORMAL LOW (ref 34.8–46.6)
HGB: 10 g/dL — ABNORMAL LOW (ref 11.6–15.9)
LYMPH%: 9.4 % — AB (ref 14.0–49.7)
MCH: 29.7 pg (ref 25.1–34.0)
MCHC: 33 g/dL (ref 31.5–36.0)
MCV: 90 fL (ref 79.5–101.0)
MONO#: 0.3 10*3/uL (ref 0.1–0.9)
MONO%: 16.4 % — AB (ref 0.0–14.0)
NEUT%: 73.3 % (ref 38.4–76.8)
NEUTROS ABS: 1.5 10*3/uL (ref 1.5–6.5)
Platelets: 60 10*3/uL — ABNORMAL LOW (ref 145–400)
RBC: 3.37 10*6/uL — AB (ref 3.70–5.45)
RDW: 16.8 % — ABNORMAL HIGH (ref 11.2–14.5)
WBC: 2 10*3/uL — AB (ref 3.9–10.3)
lymph#: 0.2 10*3/uL — ABNORMAL LOW (ref 0.9–3.3)

## 2016-04-03 LAB — COMPREHENSIVE METABOLIC PANEL
ALK PHOS: 81 U/L (ref 40–150)
ALT: 39 U/L (ref 0–55)
ANION GAP: 9 meq/L (ref 3–11)
AST: 25 U/L (ref 5–34)
Albumin: 2.5 g/dL — ABNORMAL LOW (ref 3.5–5.0)
BUN: 19.2 mg/dL (ref 7.0–26.0)
CALCIUM: 8.9 mg/dL (ref 8.4–10.4)
CO2: 25 mEq/L (ref 22–29)
Chloride: 107 mEq/L (ref 98–109)
Creatinine: 1.1 mg/dL (ref 0.6–1.1)
EGFR: 54 mL/min/{1.73_m2} — AB (ref >90)
Glucose: 148 mg/dl — ABNORMAL HIGH (ref 70–140)
POTASSIUM: 3.9 meq/L (ref 3.5–5.1)
SODIUM: 141 meq/L (ref 136–145)
TOTAL PROTEIN: 6.4 g/dL (ref 6.4–8.3)
Total Bilirubin: 0.44 mg/dL (ref 0.20–1.20)

## 2016-04-03 LAB — TSH: TSH: 1.583 m(IU)/L (ref 0.308–3.960)

## 2016-04-03 NOTE — Telephone Encounter (Signed)
GAVE PATIENT AVS REPORT AND APPOINTMENTS FOR September THRU November  °

## 2016-04-03 NOTE — Progress Notes (Signed)
Radiation Oncology         [phone] ________________________________  Name: Laura Walter MRN: [id number]  Date: 04/03/2016  DOB: 1951-03-25  Follow-Up Visit Note  CC: [name], MD  Curt Bears, MD  Diagnosis: Stage IV (t2a, N3, M1b) Non-small lung cancer  Interval Since Last Radiation:  1 month, 03/04/2016  Narrative:  The patient returns today for routine follow-up. Patient had a fall at the start of August and episodes of confusion began. Upon hospitalization, she underwent an MRI on 03/10/16. Dr. 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 She was placed on Keppra. She saw Dr. Jannifer Franklin on 03/13/16. Patient now complains of general weakness, fatigue, taste changes, yellow tongue. Patient states she is not very active and poor balance that requires a lot of attention. Patient is able to open and close a fist on her left hand, but not strong.  Patient is taking carboplatin/almita, keytruda q 3 weeks. Patient will see Dr. Julien Nordmann today.  ALLERGIES:  is allergic to sulfonamide derivatives.  Meds: Current Outpatient Prescriptions  Medication Sig Dispense Refill  . FeFum-FePoly-FA-B Cmp-C-Biot (INTEGRA PLUS) CAPS Take 1 capsule by mouth every morning. 30 capsule 2  . folic acid (FOLVITE) 1 MG tablet Take 1 tablet (1 mg total) by mouth daily. 30 tablet 4  . levETIRAcetam (KEPPRA) 500 MG tablet Take 1 tablet (500 mg total) by mouth 2 (two) times daily. 60 tablet 2  . Nutritional Supplements (FEEDING SUPPLEMENT, OSMOLITE 1.5 CAL,) LIQD Place 237 mLs into feeding tube 4 (four) times daily. (Patient taking differently: Place 237 mLs into feeding tube 2 (two) times daily. ) 958 mL 12  . OLANZapine (ZYPREXA) 10 MG tablet TAKE 1 TABLET (10 MG TOTAL) BY MOUTH AT BEDTIME. (INS PAYS 12/13/2015) 30 tablet 0  . dexamethasone (DECADRON) 4 MG tablet Take 1 tablet (4 mg total) by mouth 4 (four) times daily. 120 tablet 0  . HYDROcodone-acetaminophen (NORCO) 5-325 MG tablet  Take 1-2 tablets by mouth every 4 (four) hours as needed for moderate pain. (Patient not taking: Reported on 03/12/2016) 60 tablet 0  . ibuprofen (ADVIL,MOTRIN) 200 MG tablet Take 200 mg by mouth every 6 (six) hours as needed for moderate pain. Reported on 12/19/2015    . loperamide (IMODIUM) 2 MG capsule Take 2 mg by mouth as needed for diarrhea or loose stools (Up to 8 times daily, if needed.). Reported on 12/19/2015    . traMADol (ULTRAM) 50 MG tablet Take 1 tablet (50 mg total) by mouth every 6 (six) hours as needed. (Patient not taking: Reported on 03/08/2016) 60 tablet 0   No current facility-administered medications for this encounter.     Physical Findings: The patient is in no acute distress. Patient is alert and oriented.  height is '5\' 7"'$  (1.702 m) and weight is 131 lb 4.8 oz (59.6 kg). Her oral temperature is 97.8 F (36.6 C). Her blood pressure is 110/77 and her pulse is 109 (abnormal). Her respiration is 20 and oxygen saturation is 100%. .    Lab Findings: Lab Results  Component Value Date   WBC 2.0 (L) 04/03/2016   HGB 10.0 (L) 04/03/2016   HCT 30.3 (L) 04/03/2016   MCV 90.0 04/03/2016   PLT 60 (L) 04/03/2016     Radiographic Findings: Ct Head Wo Contrast  Result Date: 03/09/2016 CLINICAL DATA:  65 year old female with fall. History of brain tumor. EXAM: CT HEAD WITHOUT CONTRAST TECHNIQUE: Contiguous axial images were obtained from  the base of the skull through the vertex without intravenous contrast. COMPARISON:  Brain MRI dated 02/09/2016 FINDINGS: The ventricles and sulci are appropriate in size for patient's age. Moderate periventricular and deep white matter chronic microvascular ischemic changes noted. There is no acute intracranial hemorrhage. No mass effect or midline shift noted. The visualized paranasal sinuses and mastoid air cells are clear. The calvarium is intact. IMPRESSION: No acute intracranial hemorrhage. Mild moderate age-related atrophy and chronic microvascular  ischemic changes. Electronically Signed   By: Anner Crete M.D.   On: 03/09/2016 23:12   Mr Jodene Nam Head Wo Contrast  Result Date: 03/10/2016 CLINICAL DATA:  Aphasia and confusion. History of stage IV metastatic lung cancer, status post whole-brain radiation. EXAM: MRI HEAD WITHOUT AND WITH CONTRAST MRA HEAD WITHOUT CONTRAST MRI NECK WITHOUT AND WITH CONTRAST TECHNIQUE: Multiplanar, multiecho pulse sequences of the brain and surrounding structures were obtained without and with intravenous contrast. Angiographic images of the head were obtained using MRA technique without contrast. Multiplanar, multiecho pulse sequences of the neck and surrounding structures were obtained without and with intravenous contrast. CONTRAST:  32m MULTIHANCE GADOBENATE DIMEGLUMINE 529 MG/ML IV SOLN COMPARISON:  MRI of the brain February 09, 2016 FINDINGS: MRI HEAD FINDINGS INTRACRANIAL CONTENTS: No reduced diffusion to suggest acute ischemia. Punctate focus of spurious reduced diffusion corresponding to susceptibility artifact and known metastasis (previously 5 mm, now 3 mm) LEFT frontal lobe, decreased enhancement from prior examination. No new foci of susceptibility artifact, no new metastasis. Mild ventriculomegaly on the basis of global parenchymal brain volume loss. Confluent supratentorial white matter FLAIR T2 hyperintensities similar to prior examination. No midline shift, mass effect. No abnormal extra-axial fluid collection or enhancement. No extra-axial masses. ORBITS: The included ocular globes and orbital contents are non-suspicious. SINUSES: The mastoid air-cells and included paranasal sinuses are well-aerated. SKULL/SOFT TISSUES: No abnormal sellar expansion. No suspicious calvarial bone marrow signal. T2 bright signal in skullbase compatible with prior radiation. Craniocervical junction maintained. MRA HEAD FINDINGS ANTERIOR CIRCULATION: Normal flow related enhancement of the included cervical, petrous, cavernous and  supraclinoid internal carotid arteries. Bilateral anterior cerebral arteries arise directly from the shortening LEFT A1 segment. Segment Normal flow related enhancement of the anterior and middle cerebral arteries, including distal segments. No large vessel occlusion, high-grade stenosis, abnormal luminal irregularity, aneurysm. POSTERIOR CIRCULATION: vertebral artery is dominant. Basilar artery is patent, with normal flow related enhancement of the main branch vessels. Normal flow related enhancement of the posterior cerebral arteries. No large vessel occlusion, high-grade stenosis, abnormal luminal irregularity, aneurysm. MRI NECK FINDINGS Source images and MIP image were reviewed. The common carotid arteries are widely patent bilaterally. The carotid bifurcation is patent bilaterally and there is no significant carotid stenosis. Both vertebral arteries and patent to the basilar without significant vertebral stenosis. There is no evidence for atherosclerosis or flow limiting stenosis. IMPRESSION: MRI HEAD: No acute intracranial process. Interval response of treatment with faint residual LEFT frontal lobe metastasis. Mild chronic small-vessel Mild global parenchymal brain volume loss with extensive white matter changes compatible with whole-brain radiation. MRA HEAD: Negative. MRA NECK: Negative. Preliminary findings discussed with and reconfirmed by Dr. MRoland Rackon 03/10/2016 at approximately 2:30 am. Electronically Signed   By: CElon AlasM.D.   On: 03/10/2016 02:49   Mr Angiogram Neck W Wo Contrast  Result Date: 03/10/2016 CLINICAL DATA:  Aphasia and confusion. History of stage IV metastatic lung cancer, status post whole-brain radiation. EXAM: MRI HEAD WITHOUT AND WITH CONTRAST MRA HEAD WITHOUT CONTRAST MRI  NECK WITHOUT AND WITH CONTRAST TECHNIQUE: Multiplanar, multiecho pulse sequences of the brain and surrounding structures were obtained without and with intravenous contrast. Angiographic  images of the head were obtained using MRA technique without contrast. Multiplanar, multiecho pulse sequences of the neck and surrounding structures were obtained without and with intravenous contrast. CONTRAST:  82m MULTIHANCE GADOBENATE DIMEGLUMINE 529 MG/ML IV SOLN COMPARISON:  MRI of the brain February 09, 2016 FINDINGS: MRI HEAD FINDINGS INTRACRANIAL CONTENTS: No reduced diffusion to suggest acute ischemia. Punctate focus of spurious reduced diffusion corresponding to susceptibility artifact and known metastasis (previously 5 mm, now 3 mm) LEFT frontal lobe, decreased enhancement from prior examination. No new foci of susceptibility artifact, no new metastasis. Mild ventriculomegaly on the basis of global parenchymal brain volume loss. Confluent supratentorial white matter FLAIR T2 hyperintensities similar to prior examination. No midline shift, mass effect. No abnormal extra-axial fluid collection or enhancement. No extra-axial masses. ORBITS: The included ocular globes and orbital contents are non-suspicious. SINUSES: The mastoid air-cells and included paranasal sinuses are well-aerated. SKULL/SOFT TISSUES: No abnormal sellar expansion. No suspicious calvarial bone marrow signal. T2 bright signal in skullbase compatible with prior radiation. Craniocervical junction maintained. MRA HEAD FINDINGS ANTERIOR CIRCULATION: Normal flow related enhancement of the included cervical, petrous, cavernous and supraclinoid internal carotid arteries. Bilateral anterior cerebral arteries arise directly from the shortening LEFT A1 segment. Segment Normal flow related enhancement of the anterior and middle cerebral arteries, including distal segments. No large vessel occlusion, high-grade stenosis, abnormal luminal irregularity, aneurysm. POSTERIOR CIRCULATION: vertebral artery is dominant. Basilar artery is patent, with normal flow related enhancement of the main branch vessels. Normal flow related enhancement of the posterior  cerebral arteries. No large vessel occlusion, high-grade stenosis, abnormal luminal irregularity, aneurysm. MRI NECK FINDINGS Source images and MIP image were reviewed. The common carotid arteries are widely patent bilaterally. The carotid bifurcation is patent bilaterally and there is no significant carotid stenosis. Both vertebral arteries and patent to the basilar without significant vertebral stenosis. There is no evidence for atherosclerosis or flow limiting stenosis. IMPRESSION: MRI HEAD: No acute intracranial process. Interval response of treatment with faint residual LEFT frontal lobe metastasis. Mild chronic small-vessel Mild global parenchymal brain volume loss with extensive white matter changes compatible with whole-brain radiation. MRA HEAD: Negative. MRA NECK: Negative. Preliminary findings discussed with and reconfirmed by Dr. MRoland Rackon 03/10/2016 at approximately 2:30 am. Electronically Signed   By: CElon AlasM.D.   On: 03/10/2016 02:49   Mr Brain W Or Wo Contrast  Result Date: 03/10/2016 CLINICAL DATA:  Aphasia and confusion. History of stage IV metastatic lung cancer, status post whole-brain radiation. EXAM: MRI HEAD WITHOUT AND WITH CONTRAST MRA HEAD WITHOUT CONTRAST MRI NECK WITHOUT AND WITH CONTRAST TECHNIQUE: Multiplanar, multiecho pulse sequences of the brain and surrounding structures were obtained without and with intravenous contrast. Angiographic images of the head were obtained using MRA technique without contrast. Multiplanar, multiecho pulse sequences of the neck and surrounding structures were obtained without and with intravenous contrast. CONTRAST:  133mMULTIHANCE GADOBENATE DIMEGLUMINE 529 MG/ML IV SOLN COMPARISON:  MRI of the brain February 09, 2016 FINDINGS: MRI HEAD FINDINGS INTRACRANIAL CONTENTS: No reduced diffusion to suggest acute ischemia. Punctate focus of spurious reduced diffusion corresponding to susceptibility artifact and known metastasis (previously  5 mm, now 3 mm) LEFT frontal lobe, decreased enhancement from prior examination. No new foci of susceptibility artifact, no new metastasis. Mild ventriculomegaly on the basis of global parenchymal brain volume loss. Confluent supratentorial white  matter FLAIR T2 hyperintensities similar to prior examination. No midline shift, mass effect. No abnormal extra-axial fluid collection or enhancement. No extra-axial masses. ORBITS: The included ocular globes and orbital contents are non-suspicious. SINUSES: The mastoid air-cells and included paranasal sinuses are well-aerated. SKULL/SOFT TISSUES: No abnormal sellar expansion. No suspicious calvarial bone marrow signal. T2 bright signal in skullbase compatible with prior radiation. Craniocervical junction maintained. MRA HEAD FINDINGS ANTERIOR CIRCULATION: Normal flow related enhancement of the included cervical, petrous, cavernous and supraclinoid internal carotid arteries. Bilateral anterior cerebral arteries arise directly from the shortening LEFT A1 segment. Segment Normal flow related enhancement of the anterior and middle cerebral arteries, including distal segments. No large vessel occlusion, high-grade stenosis, abnormal luminal irregularity, aneurysm. POSTERIOR CIRCULATION: vertebral artery is dominant. Basilar artery is patent, with normal flow related enhancement of the main branch vessels. Normal flow related enhancement of the posterior cerebral arteries. No large vessel occlusion, high-grade stenosis, abnormal luminal irregularity, aneurysm. MRI NECK FINDINGS Source images and MIP image were reviewed. The common carotid arteries are widely patent bilaterally. The carotid bifurcation is patent bilaterally and there is no significant carotid stenosis. Both vertebral arteries and patent to the basilar without significant vertebral stenosis. There is no evidence for atherosclerosis or flow limiting stenosis. IMPRESSION: MRI HEAD: No acute intracranial process.  Interval response of treatment with faint residual LEFT frontal lobe metastasis. Mild chronic small-vessel Mild global parenchymal brain volume loss with extensive white matter changes compatible with whole-brain radiation. MRA HEAD: Negative. MRA NECK: Negative. Preliminary findings discussed with and reconfirmed by Dr. Roland Rack on 03/10/2016 at approximately 2:30 am. Electronically Signed   By: Elon Alas M.D.   On: 03/10/2016 02:49    Impression: Patient appears to have good clinical response to palliative radiation treatment. Patient, however, is extremely fatigued today and it appears her performance status has declined some. Patient is seeing Dr. Julien Nordmann today to discuss systemic treatment.  Plan:  Repeat brain MRI scan in 2 months in continued follow-up through the brain tumor program.   Jodelle Gross, M.D., Ph.D.    This document serves as a record of services personally performed by Kyung Rudd, MD. It was created on his behalf by Bethann Humble, a trained medical scribe. The creation of this record is based on the scribe's personal observations and the provider's statements to them. This document has been checked and approved by the attending provider.

## 2016-04-03 NOTE — Progress Notes (Signed)
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 Lisbeth Renshaw. 2) Gilotrif 40 mg by mouth daily. Status post 5 months of treatment. 3) stereotactic radiotherapy to the enlarging right upper lobe lung mass under the care of Dr. Lisbeth Renshaw. 4) stereotactic radiotherapy to another right upper lobe lung mass as well as solitary brain metastases. 5) Gilotrif 30 mg by mouth daily. First dose started 08/31/2015. Status post 6 months of treatment, discontinued secondary to disease progression. 6) palliative radiotherapy to the progressive lesion in the right side of the chest as well as the metastatic bone lesion involving C6 and C7 vertebrae with epidural tumor in the lateral recess as well as palliative radiotherapy to T8-the in the spine, under the care of Dr. Lisbeth Renshaw completed on 03/04/2016.  CURRENT THERAPY:  1) Systemic chemotherapy with carboplatin for AUC of 5, Alimta 500 MG/M2 and Ketruda (pembrolizumab) 200 MG/M2 every 3 weeks. First dose 03/13/2016. 2) Xgeva 120 mg subcutaneously every 4 weeks. First dose 05/01/2015.  INTERVAL HISTORY: Laura Walter 65 y.o. female returns to the clinic today for follow-up visit accompanied by her partner. The patient had a rough time with the first cycle of the systemic chemotherapy with significant weakness and fatigue as well as severe pancytopenia. She required PRBCs as well as platelet transfusion last week. She is feeling a little bit better today. She denied having any  significant chest pain, shortness of breath, cough or hemoptysis. She has no nausea or vomiting. She denied having any significant weight loss or night sweats. She has no fever or chills. She is here today for evaluation and repeat blood work before starting cycle #2. Molecular studies are still pending.  MEDICAL HISTORY: Past Medical History:  Diagnosis Date  . Allergy    seasonal  . Bone cancer ([facility]) 01/10/15 PET   lytic lesion lt 1st rib,T9,L3 Lt acetabulum  . Brain cancer ([facility]) 01/13/15   MRI multiple small brain mets 10 individual lesions  . Bronchitis   . H/O reactive hypoglycemia   . IBS (irritable bowel syndrome)   . Lung cancer ([facility])    stageIV nscca rul  . Osteopenia   . Paronychia 11/17/2015  . Pneumonia 2009 & 2011  . S/P radiation therapy completed 02/08/15   whole brain ,spine  . S/P radiation therapy completed 11/27/15   lung    ALLERGIES:  is allergic to sulfonamide derivatives.  MEDICATIONS:  Current Outpatient Prescriptions  Medication Sig Dispense Refill  . dexamethasone (DECADRON) 4 MG tablet Take 1 tablet (4 mg total) by mouth 4 (four) times daily. (Patient not taking: Reported on 04/03/2016) 120 tablet 0  . FeFum-FePoly-FA-B Cmp-C-Biot (INTEGRA PLUS) CAPS Take 1 capsule by mouth every morning. 30 capsule 2  . folic acid (FOLVITE) 1 MG tablet Take 1 tablet (1 mg total) by mouth daily. 30 tablet 4  . HYDROcodone-acetaminophen (NORCO) 5-325 MG tablet Take 1-2 tablets by mouth every 4 (four) hours as needed for moderate pain. (Patient not taking: Reported on 03/12/2016) 60 tablet 0  .  ibuprofen (ADVIL,MOTRIN) 200 MG tablet Take 200 mg by mouth every 6 (six) hours as needed for moderate pain. Reported on 12/19/2015    . levETIRAcetam (KEPPRA) 500 MG tablet Take 1 tablet (500 mg total) by mouth 2 (two) times daily. 60 tablet 2  . loperamide (IMODIUM) 2 MG capsule Take 2 mg by mouth as needed for diarrhea or loose stools (Up to 8 times daily, if needed.). Reported on 12/19/2015     . Nutritional Supplements (FEEDING SUPPLEMENT, OSMOLITE 1.5 CAL,) LIQD Place 237 mLs into feeding tube 4 (four) times daily. (Patient taking differently: Place 237 mLs into feeding tube 2 (two) times daily. ) 958 mL 12  . OLANZapine (ZYPREXA) 10 MG tablet TAKE 1 TABLET (10 MG TOTAL) BY MOUTH AT BEDTIME. (INS PAYS 12/13/2015) 30 tablet 0  . traMADol (ULTRAM) 50 MG tablet Take 1 tablet (50 mg total) by mouth every 6 (six) hours as needed. (Patient not taking: Reported on 03/08/2016) 60 tablet 0   No current facility-administered medications for this visit.     SURGICAL HISTORY:  Past Surgical History:  Procedure Laterality Date  . COLONOSCOPY  2009   Negative; [facility]  . ESOPHAGOGASTRODUODENOSCOPY (EGD) WITH PROPOFOL N/A 04/11/2015   Procedure: ESOPHAGOGASTRODUODENOSCOPY (EGD) WITH PROPOFOL;  Surgeon: Carol Ada, MD;  Location: [facility];  Service: Endoscopy;  Laterality: N/A;  . PEG PLACEMENT N/A 04/11/2015   Procedure: PERCUTANEOUS ENDOSCOPIC GASTROSTOMY (PEG) PLACEMENT;  Surgeon: Carol Ada, MD;  Location: [facility];  Service: Endoscopy;  Laterality: N/A;  . TONSILLECTOMY AND ADENOIDECTOMY    . WISDOM TOOTH EXTRACTION      REVIEW OF SYSTEMS:  Constitutional: positive for fatigue Eyes: negative Ears, nose, mouth, throat, and face: negative Respiratory: positive for dyspnea on exertion Cardiovascular: negative Gastrointestinal: negative Genitourinary:negative Integument/breast: negative Hematologic/lymphatic: negative Musculoskeletal:negative Neurological: negative Behavioral/Psych: negative Endocrine: negative Allergic/Immunologic: negative   PHYSICAL EXAMINATION: General appearance: alert, cooperative, fatigued and no distress Head: Normocephalic, without obvious abnormality, Several areas of bruises and ecchymosis on the right side of the face Neck: no adenopathy, no JVD, supple, symmetrical, trachea midline and thyroid not enlarged, symmetric, no  tenderness/mass/nodules Lymph nodes: Cervical, supraclavicular, and axillary nodes normal. Resp: clear to auscultation bilaterally Back: Tenderness to palpation at the lower thoracic and lumbar vertebrae. Cardio: regular rate and rhythm, S1, S2 normal, no murmur, click, rub or gallop GI: soft, non-tender; bowel sounds normal; no masses,  no organomegaly Extremities: extremities normal, atraumatic, no cyanosis or edema Neurologic: Alert and oriented X 3, normal strength and tone. Normal symmetric reflexes. Normal coordination and gait    ECOG PERFORMANCE STATUS: 1 - Symptomatic but completely ambulatory  Blood pressure 110/77, pulse (!) 109, temperature 97.8 F (36.6 C), temperature source Oral, resp. rate 20, height _0  (1.702 m), weight 131 lb 4.8 oz (59.6 kg), SpO2 100 %.  LABORATORY DATA: Lab Results  Component Value Date   WBC 2.0 (L) 04/03/2016   HGB 10.0 (L) 04/03/2016   HCT 30.3 (L) 04/03/2016   MCV 90.0 04/03/2016   PLT 60 (L) 04/03/2016      Chemistry      Component Value Date/Time   NA 141 04/03/2016 1306   K 3.9 04/03/2016 1306   CL 105 03/09/2016 2238   CO2 25 04/03/2016 1306   BUN 19.2 04/03/2016 1306   CREATININE 1.1 04/03/2016 1306      Component Value Date/Time   CALCIUM 8.9 04/03/2016 1306   ALKPHOS 81 04/03/2016 1306   AST 25 04/03/2016 1306  ALT 39 04/03/2016 1306   BILITOT 0.44 04/03/2016 1306       RADIOGRAPHIC STUDIES: Ct Head Wo Contrast  Result Date: 03/09/2016 CLINICAL DATA:  65 year old female with fall. History of brain tumor. EXAM: CT HEAD WITHOUT CONTRAST TECHNIQUE: Contiguous axial images were obtained from the base of the skull through the vertex without intravenous contrast. COMPARISON:  Brain MRI dated 02/09/2016 FINDINGS: The ventricles and sulci are appropriate in size for patient's age. Moderate periventricular and deep white matter chronic microvascular ischemic changes noted. There is no acute intracranial hemorrhage. No mass  effect or midline shift noted. The visualized paranasal sinuses and mastoid air cells are clear. The calvarium is intact. IMPRESSION: No acute intracranial hemorrhage. Mild moderate age-related atrophy and chronic microvascular ischemic changes. Electronically Signed   By: Anner Crete M.D.   On: 03/09/2016 23:12   Mr Jodene Nam Head Wo Contrast  Result Date: 03/10/2016 CLINICAL DATA:  Aphasia and confusion. History of stage IV metastatic lung cancer, status post whole-brain radiation. EXAM: MRI HEAD WITHOUT AND WITH CONTRAST MRA HEAD WITHOUT CONTRAST MRI NECK WITHOUT AND WITH CONTRAST TECHNIQUE: Multiplanar, multiecho pulse sequences of the brain and surrounding structures were obtained without and with intravenous contrast. Angiographic images of the head were obtained using MRA technique without contrast. Multiplanar, multiecho pulse sequences of the neck and surrounding structures were obtained without and with intravenous contrast. CONTRAST:  63m MULTIHANCE GADOBENATE DIMEGLUMINE 529 MG/ML IV SOLN COMPARISON:  MRI of the brain February 09, 2016 FINDINGS: MRI HEAD FINDINGS INTRACRANIAL CONTENTS: No reduced diffusion to suggest acute ischemia. Punctate focus of spurious reduced diffusion corresponding to susceptibility artifact and known metastasis (previously 5 mm, now 3 mm) LEFT frontal lobe, decreased enhancement from prior examination. No new foci of susceptibility artifact, no new metastasis. Mild ventriculomegaly on the basis of global parenchymal brain volume loss. Confluent supratentorial white matter FLAIR T2 hyperintensities similar to prior examination. No midline shift, mass effect. No abnormal extra-axial fluid collection or enhancement. No extra-axial masses. ORBITS: The included ocular globes and orbital contents are non-suspicious. SINUSES: The mastoid air-cells and included paranasal sinuses are well-aerated. SKULL/SOFT TISSUES: No abnormal sellar expansion. No suspicious calvarial bone marrow  signal. T2 bright signal in skullbase compatible with prior radiation. Craniocervical junction maintained. MRA HEAD FINDINGS ANTERIOR CIRCULATION: Normal flow related enhancement of the included cervical, petrous, cavernous and supraclinoid internal carotid arteries. Bilateral anterior cerebral arteries arise directly from the shortening LEFT A1 segment. Segment Normal flow related enhancement of the anterior and middle cerebral arteries, including distal segments. No large vessel occlusion, high-grade stenosis, abnormal luminal irregularity, aneurysm. POSTERIOR CIRCULATION: vertebral artery is dominant. Basilar artery is patent, with normal flow related enhancement of the main branch vessels. Normal flow related enhancement of the posterior cerebral arteries. No large vessel occlusion, high-grade stenosis, abnormal luminal irregularity, aneurysm. MRI NECK FINDINGS Source images and MIP image were reviewed. The common carotid arteries are widely patent bilaterally. The carotid bifurcation is patent bilaterally and there is no significant carotid stenosis. Both vertebral arteries and patent to the basilar without significant vertebral stenosis. There is no evidence for atherosclerosis or flow limiting stenosis. IMPRESSION: MRI HEAD: No acute intracranial process. Interval response of treatment with faint residual LEFT frontal lobe metastasis. Mild chronic small-vessel Mild global parenchymal brain volume loss with extensive white matter changes compatible with whole-brain radiation. MRA HEAD: Negative. MRA NECK: Negative. Preliminary findings discussed with and reconfirmed by Dr. MRoland Rackon 03/10/2016 at approximately 2:30 am. Electronically Signed   By: CSandie Ano  Bloomer M.D.   On: 03/10/2016 02:49   Mr Angiogram Neck W Wo Contrast  Result Date: 03/10/2016 CLINICAL DATA:  Aphasia and confusion. History of stage IV metastatic lung cancer, status post whole-brain radiation. EXAM: MRI HEAD WITHOUT AND WITH  CONTRAST MRA HEAD WITHOUT CONTRAST MRI NECK WITHOUT AND WITH CONTRAST TECHNIQUE: Multiplanar, multiecho pulse sequences of the brain and surrounding structures were obtained without and with intravenous contrast. Angiographic images of the head were obtained using MRA technique without contrast. Multiplanar, multiecho pulse sequences of the neck and surrounding structures were obtained without and with intravenous contrast. CONTRAST:  1m MULTIHANCE GADOBENATE DIMEGLUMINE 529 MG/ML IV SOLN COMPARISON:  MRI of the brain February 09, 2016 FINDINGS: MRI HEAD FINDINGS INTRACRANIAL CONTENTS: No reduced diffusion to suggest acute ischemia. Punctate focus of spurious reduced diffusion corresponding to susceptibility artifact and known metastasis (previously 5 mm, now 3 mm) LEFT frontal lobe, decreased enhancement from prior examination. No new foci of susceptibility artifact, no new metastasis. Mild ventriculomegaly on the basis of global parenchymal brain volume loss. Confluent supratentorial white matter FLAIR T2 hyperintensities similar to prior examination. No midline shift, mass effect. No abnormal extra-axial fluid collection or enhancement. No extra-axial masses. ORBITS: The included ocular globes and orbital contents are non-suspicious. SINUSES: The mastoid air-cells and included paranasal sinuses are well-aerated. SKULL/SOFT TISSUES: No abnormal sellar expansion. No suspicious calvarial bone marrow signal. T2 bright signal in skullbase compatible with prior radiation. Craniocervical junction maintained. MRA HEAD FINDINGS ANTERIOR CIRCULATION: Normal flow related enhancement of the included cervical, petrous, cavernous and supraclinoid internal carotid arteries. Bilateral anterior cerebral arteries arise directly from the shortening LEFT A1 segment. Segment Normal flow related enhancement of the anterior and middle cerebral arteries, including distal segments. No large vessel occlusion, high-grade stenosis, abnormal  luminal irregularity, aneurysm. POSTERIOR CIRCULATION: vertebral artery is dominant. Basilar artery is patent, with normal flow related enhancement of the main branch vessels. Normal flow related enhancement of the posterior cerebral arteries. No large vessel occlusion, high-grade stenosis, abnormal luminal irregularity, aneurysm. MRI NECK FINDINGS Source images and MIP image were reviewed. The common carotid arteries are widely patent bilaterally. The carotid bifurcation is patent bilaterally and there is no significant carotid stenosis. Both vertebral arteries and patent to the basilar without significant vertebral stenosis. There is no evidence for atherosclerosis or flow limiting stenosis. IMPRESSION: MRI HEAD: No acute intracranial process. Interval response of treatment with faint residual LEFT frontal lobe metastasis. Mild chronic small-vessel Mild global parenchymal brain volume loss with extensive white matter changes compatible with whole-brain radiation. MRA HEAD: Negative. MRA NECK: Negative. Preliminary findings discussed with and reconfirmed by Dr. MRoland Rackon 03/10/2016 at approximately 2:30 am. Electronically Signed   By: CElon AlasM.D.   On: 03/10/2016 02:49   Mr Brain W Or Wo Contrast  Result Date: 03/10/2016 CLINICAL DATA:  Aphasia and confusion. History of stage IV metastatic lung cancer, status post whole-brain radiation. EXAM: MRI HEAD WITHOUT AND WITH CONTRAST MRA HEAD WITHOUT CONTRAST MRI NECK WITHOUT AND WITH CONTRAST TECHNIQUE: Multiplanar, multiecho pulse sequences of the brain and surrounding structures were obtained without and with intravenous contrast. Angiographic images of the head were obtained using MRA technique without contrast. Multiplanar, multiecho pulse sequences of the neck and surrounding structures were obtained without and with intravenous contrast. CONTRAST:  147mMULTIHANCE GADOBENATE DIMEGLUMINE 529 MG/ML IV SOLN COMPARISON:  MRI of the brain February 09, 2016 FINDINGS: MRI HEAD FINDINGS INTRACRANIAL CONTENTS: No reduced diffusion to suggest acute ischemia. Punctate focus  of spurious reduced diffusion corresponding to susceptibility artifact and known metastasis (previously 5 mm, now 3 mm) LEFT frontal lobe, decreased enhancement from prior examination. No new foci of susceptibility artifact, no new metastasis. Mild ventriculomegaly on the basis of global parenchymal brain volume loss. Confluent supratentorial white matter FLAIR T2 hyperintensities similar to prior examination. No midline shift, mass effect. No abnormal extra-axial fluid collection or enhancement. No extra-axial masses. ORBITS: The included ocular globes and orbital contents are non-suspicious. SINUSES: The mastoid air-cells and included paranasal sinuses are well-aerated. SKULL/SOFT TISSUES: No abnormal sellar expansion. No suspicious calvarial bone marrow signal. T2 bright signal in skullbase compatible with prior radiation. Craniocervical junction maintained. MRA HEAD FINDINGS ANTERIOR CIRCULATION: Normal flow related enhancement of the included cervical, petrous, cavernous and supraclinoid internal carotid arteries. Bilateral anterior cerebral arteries arise directly from the shortening LEFT A1 segment. Segment Normal flow related enhancement of the anterior and middle cerebral arteries, including distal segments. No large vessel occlusion, high-grade stenosis, abnormal luminal irregularity, aneurysm. POSTERIOR CIRCULATION: vertebral artery is dominant. Basilar artery is patent, with normal flow related enhancement of the main branch vessels. Normal flow related enhancement of the posterior cerebral arteries. No large vessel occlusion, high-grade stenosis, abnormal luminal irregularity, aneurysm. MRI NECK FINDINGS Source images and MIP image were reviewed. The common carotid arteries are widely patent bilaterally. The carotid bifurcation is patent bilaterally and there is no significant  carotid stenosis. Both vertebral arteries and patent to the basilar without significant vertebral stenosis. There is no evidence for atherosclerosis or flow limiting stenosis. IMPRESSION: MRI HEAD: No acute intracranial process. Interval response of treatment with faint residual LEFT frontal lobe metastasis. Mild chronic small-vessel Mild global parenchymal brain volume loss with extensive white matter changes compatible with whole-brain radiation. MRA HEAD: Negative. MRA NECK: Negative. Preliminary findings discussed with and reconfirmed by Dr. Roland Rack on 03/10/2016 at approximately 2:30 am. Electronically Signed   By: Elon Alas M.D.   On: 03/10/2016 02:49    ASSESSMENT AND PLAN: This is a very pleasant 65 years old never smoker white female recently diagnosed with stage IV non-small cell lung cancer, adenocarcinoma with positive EGFR mutation with deletion in exon 19 presented with large right upper lobe lung mass in addition to bilateral mediastinal, supraclavicular lymphadenopathy as well as bone and multiple brain metastasis diagnosed in June 2016.  The patient completed whole brain irradiation under the care of Dr. Lisbeth Renshaw. She was started on treatment with Gilotrif 40 mg by mouth daily status post 5 months but this was discontinued secondary to intolerance with significant bony contact, skin rash in the sacral area as well as few episodes of diarrhea  The patient was started on Gilotrif 30 mg by mouth daily since 08/31/2015. She is tolerating it much better with the reduced dose was less adverse effects. Unfortunately the recent CT scan of the chest, abdomen and pelvis showed interval increase in the size of the right upper lobe lung mass compatible with disease progression. She is currently undergoing stereotactic radiotherapy to the enlarging right upper lobe lung mass. Molecular studies from blood test as well as CT-guided core biopsy of the enlarging right upper lobe lung mass  showed no evidence for T790M resistant mutation. She tolerated her treatment with Gilotrif fairly well. Unfortunately the recent CT scan of the chest, abdomen and pelvis showed mixed response with new pleural based nodule adjacent to the previously treated right upper lobe mass. She underwent palliative radiotherapy to the new pleural-based nodule under the care of  Dr. Lisbeth Renshaw. She is also undergoing stereotactic radiotherapy to new solitary brain metastasis. She also underwent palliative radiotherapy to the lesion in the cervical spines.  The patient was started on systemic chemotherapy with carboplatin, Alimta and Ketruda (pembrolizumab) status post 1 cycle. She has rough time with the first cycle of her treatment with significant fatigue and weakness as well as severe pancytopenia requiring PRBCs and platelet transfusion. Platelets count 60,000 today and the patient is still have pancytopenia. I recommended for her to delay the start of cycle #2 x 1 week until improvement of her count. I will reduce her dose of chemotherapy to carboplatin for AUC of 4 and Alimta 375 MG/M2 in addition to the standard dose of Ketruda (pembrolizumab) every 3 weeks is starting from cycle #2. We are also still waiting for the molecular studies to see if the patient has any actionable mutation. For the metastatic bone disease, I continue the patient on Xgeva 120 mg subcutaneously on monthly basis. The patient was also advised to increased her intake of calcium supplement and vitamin D. For the nausea, she will continue on Zyprexa 10 mg by mouth daily at bedtime.  For pain management, she would continue on tramadol 50 mg by mouth every 6 hours as needed for pain. For the anemia, I started the patient on Integra +1 capsule by mouth daily. The patient would come back for follow-up visit in one month for evaluation and management of any adverse effect of her treatment before starting cycle #3. For dehydration, I strongly  encouraged the patient to increase her by mouth intake and we will be happy to arrange for IV fluid if needed. She was advised to call immediately if she has any concerning symptoms in the interval. The patient voices understanding of current disease status and treatment options and is in agreement with the current care plan.  All questions were answered. The patient knows to call the clinic with any problems, questions or concerns. We can certainly see the patient much sooner if necessary.  Disclaimer: This note was dictated with voice recognition software. Similar sounding words can inadvertently be transcribed and may not be corrected upon review.

## 2016-04-03 NOTE — Progress Notes (Addendum)
Follow up  S/p rad txs Bone mets T-8-T10,  SRS brain,  Recent hospitalization 03/10/16 with confusion,  With recent fall a few days before, had MRI 03/10/16, Dr. Leonel Ramsay saw her in the ED, felt she had a ratial seizure,  was placed on Keppra, saw Dr. Jannifer Franklin 03/13/16,  Very weak and fatigued, tastes change, tongue is yellow thick ness patient taking carboplatin/almita, keytruda q 3 weeks,  Had blood work today and sees Dr. Julien Nordmann today after this visit, scheduled EEG  04/09/16 at 830am  With Dr, Benson Norway,  1:50 PM BP 110/77 (BP Location: Left Arm, Patient Position: Sitting, Cuff Size: Normal)   Pulse (!) 109   Temp 97.8 F (36.6 C) (Oral)   Resp 20   Ht '5\' 7"'$  (1.702 m)   Wt 131 lb 4.8 oz (59.6 kg)   SpO2 100% Comment: roomair  BMI 20.56 kg/m   Wt Readings from Last 3 Encounters:  04/03/16 131 lb 4.8 oz (59.6 kg)  03/12/16 125 lb 8 oz (56.9 kg)  03/09/16 130 lb 8 oz (59.2 kg)

## 2016-04-03 NOTE — Progress Notes (Signed)
Oncology Nurse Navigator Documentation  Oncology Nurse Navigator Flowsheets 04/03/2016  Navigator Encounter Type Other/I called [facility] to check on results.  Results are not finalized and I was told they will not be completed for 11 to 13 days.  I will updated Dr. Julien Nordmann.   Treatment Phase Treatment  Barriers/Navigation Needs Coordination of Care  Interventions Coordination of Care  Acuity Level 1  Acuity Level 1 Minimal follow up required  Time Spent with Patient 15

## 2016-04-08 NOTE — Progress Notes (Signed)
  Radiation Oncology         [phone] ________________________________  Name: Laura Walter MRN: [id number]  Date: 02/07/2016  DOB: 1951-04-17  SIMULATION AND TREATMENT PLANNING NOTE  DIAGNOSIS:     ICD-9-CM ICD-10-CM   1. Malignant neoplasm of upper lobe of right lung (HCC) 162.3 C34.11   2. Metastasis to bone (HCC) 198.5 C79.51      Site:   1.  Right upper lung 2.  T8-T10 vertebral bodies  NARRATIVE:  The patient was brought to the [facility].  Identity was confirmed.  All relevant records and images related to the planned course of therapy were reviewed.   Written consent to proceed with treatment was confirmed which was freely given after reviewing the details related to the planned course of therapy had been reviewed with the patient.  Then, the patient was set-up in a stable reproducible  supine position for radiation therapy.  CT images were obtained.  Surface markings were placed.    Medically necessary complex treatment device(s) for immobilization:  Not applicable.   The CT images were loaded into the planning software.  Then the target and avoidance structures were contoured.  Treatment planning then occurred.  The radiation prescription was entered and confirmed.  A total of 5 complex treatment devices were fabricated which relate to the designed radiation treatment fields for each of the 2 separate target regions:  A 3 field technique to the lung and a 2 field technique for the spine. Each of these customized fields/ complex treatment devices will be used on a daily basis during the radiation course. I have requested : 3D Simulation  I have requested a DVH of the following structures: Target volume, lungs, spinal cord, heart.   PLAN:  The patient will receive 30 Gy in 12 fractions to the right upper lung and 30 gray in 10 fractions to the thoracic spine  Special treatment procedure The treatment to the right upper lung constitutes a course of reirradiation.  This will require extra work to account for this prior course of radiation treatments which will be reviewed in detail with a cumulative plan. This therefore constitutes a special treatment procedure.  ________________________________   Jodelle Gross, MD, PhD

## 2016-04-08 NOTE — Addendum Note (Signed)
Encounter addended by: Kyung Rudd, MD on: 04/08/2016 10:35 PM<BR>    Actions taken: Visit diagnoses modified, Sign clinical note

## 2016-04-08 NOTE — Progress Notes (Signed)
  Radiation Oncology         [phone]) [phone] ________________________________  Name: Laura Walter MRN: [id number]  Date: 02/23/2016  DOB: [birth year]  SIMULATION AND TREATMENT PLANNING NOTE  DIAGNOSIS:     ICD-9-CM ICD-10-CM   1. Metastasis to bone (HCC) 198.5 C79.51      Site:  Cervical spine  NARRATIVE:  The patient was brought to the [facility].  Identity was confirmed.  All relevant records and images related to the planned course of therapy were reviewed.   Written consent to proceed with treatment was confirmed which was freely given after reviewing the details related to the planned course of therapy had been reviewed with the patient.  Then, the patient was set-up in a stable reproducible  supine position for radiation therapy.  CT images were obtained.  Surface markings were placed.    Medically necessary complex treatment device(s) for immobilization:  Customized thermoplastic head cast.   The CT images were loaded into the planning software.  Then the target and avoidance structures were contoured.  Treatment planning then occurred.  The radiation prescription was entered and confirmed.  A total of 2 complex treatment devices were fabricated which relate to the designed radiation treatment fields. Each of these customized fields/ complex treatment devices will be used on a daily basis during the radiation course. I have requested : Isodose Plan.   PLAN:  The patient will receive 20 Gy in 5 fractions.  ________________________________   Jodelle Gross, MD, PhD

## 2016-04-08 NOTE — Addendum Note (Signed)
Encounter addended by: Kyung Rudd, MD on: 04/08/2016 10:34 PM<BR>    Actions taken: Visit diagnoses modified, Sign clinical note

## 2016-04-09 ENCOUNTER — Ambulatory Visit (INDEPENDENT_AMBULATORY_CARE_PROVIDER_SITE_OTHER): Payer: BC Managed Care – PPO | Admitting: Diagnostic Neuroimaging

## 2016-04-09 ENCOUNTER — Other Ambulatory Visit: Payer: Self-pay | Admitting: *Deleted

## 2016-04-09 ENCOUNTER — Ambulatory Visit (HOSPITAL_BASED_OUTPATIENT_CLINIC_OR_DEPARTMENT_OTHER): Payer: BC Managed Care – PPO

## 2016-04-09 ENCOUNTER — Telehealth: Payer: Self-pay | Admitting: *Deleted

## 2016-04-09 ENCOUNTER — Other Ambulatory Visit: Payer: Self-pay | Admitting: Medical Oncology

## 2016-04-09 DIAGNOSIS — E860 Dehydration: Secondary | ICD-10-CM | POA: Diagnosis not present

## 2016-04-09 DIAGNOSIS — R112 Nausea with vomiting, unspecified: Secondary | ICD-10-CM

## 2016-04-09 DIAGNOSIS — R4701 Aphasia: Secondary | ICD-10-CM | POA: Diagnosis not present

## 2016-04-09 DIAGNOSIS — C3411 Malignant neoplasm of upper lobe, right bronchus or lung: Secondary | ICD-10-CM

## 2016-04-09 DIAGNOSIS — C7931 Secondary malignant neoplasm of brain: Secondary | ICD-10-CM

## 2016-04-09 MED ORDER — SODIUM CHLORIDE 0.9 % IV SOLN
Freq: Once | INTRAVENOUS | Status: DC
  Filled 2016-04-09: qty 4

## 2016-04-09 MED ORDER — SODIUM CHLORIDE 0.9 % IV SOLN
Freq: Once | INTRAVENOUS | Status: AC
  Administered 2016-04-09: 16:00:00 via INTRAVENOUS

## 2016-04-09 NOTE — Patient Instructions (Signed)

## 2016-04-09 NOTE — Telephone Encounter (Signed)
Called patient as requested by [facility] infusion staff for patient to come in at 2:00 or 2:30 pm today.  "She has had some success with the fleets bullet and yes we will come in earlier.

## 2016-04-09 NOTE — Telephone Encounter (Signed)
"  I'm calling because she needs IVF.  She's weak, hands shaking.  Receives tube feedings and by mouth but more by tube because nothing tastes good.  Terribly constipated.  Hasn't had a BM in a week and not passing gas.  Took dulcolax tablet on two different days but didn't work.  No abdominal swelling.  Bought a fleets bullet but want to start with IVF."   Verbal order received and read back from Dr. Julien Nordmann for 1Liter 0.9% NS today and to use the bullet, miralax or lactulose.  Order given to [place] at this time.  Says they will be here at 3:00 pm after trying to get relief from bowels.  Scheduling POF message sent.

## 2016-04-10 ENCOUNTER — Encounter: Payer: Self-pay | Admitting: *Deleted

## 2016-04-10 ENCOUNTER — Telehealth: Payer: Self-pay | Admitting: Neurology

## 2016-04-10 NOTE — Progress Notes (Signed)
Oncology Nurse Navigator Documentation  Oncology Nurse Navigator Flowsheets 04/10/2016  Navigator Encounter Type Other/I called to follow up with [facility] regarding results.  I was told completed results by either Friday 9/8 or Monday 9/11.  I will updated Dr. Julien Nordmann.   Treatment Phase Treatment  Barriers/Navigation Needs Coordination of Care  Interventions Coordination of Care  Coordination of Care Other  Acuity Level 2  Acuity Level 2 Other  Time Spent with Patient 30

## 2016-04-10 NOTE — Telephone Encounter (Signed)
I tried to call the patient with the results of the EEG study, unable to contact the patient, unable to leave a message. I will call back tomorrow.    EEG 04/09/16:  IMPRESSION:  Abnormal EEG in the awake and drowsy state demonstrating: 1. Rare left frontal and temporal sharp waves which are  potentially epileptiform. No electrographic seizures are seen. 2. Mild generalized slowing consistent with mild encephalopathy.

## 2016-04-10 NOTE — Procedures (Signed)
   [facility]  EEG (ELECTROENCEPHALOGRAM) REPORT   STUDY DATE: 04/09/16 PATIENT NAME: Laura Walter DOB: 1950-12-13 MRN: [id number]  ORDERING CLINICIAN: Kathrynn Ducking, MD   TECHNOLOGIST: Laretta Alstrom  TECHNIQUE: Electroencephalogram was recorded utilizing standard 10-20 system of lead placement and reformatted into average and bipolar montages.  RECORDING TIME: 22 minutes  ACTIVATION: photic stimulation  CLINICAL INFORMATION: 65 year old female with metastatic lung cancer to the left frontal love of brain; evaluate for seizures.  FINDINGS: Background rhythms of 7-8 hertz and 20-30 microvolts. Rare left frontal and left temporal sharp waves are noted at FP1, F7, T3 and T5. No other focal, lateralizing, epileptiform activity or seizures are seen. Patient recorded in the awake and drowsy state. EKG channel shows regular rhythm of 80 beats per minute.   IMPRESSION:  Abnormal EEG in the awake and drowsy state demonstrating: 1. Rare left frontal and temporal sharp waves which are potentially epileptiform. No electrographic seizures are seen. 2. Mild generalized slowing consistent with mild encephalopathy.    INTERPRETING PHYSICIAN:  Penni Bombard, MD Certified in Neurology, Neurophysiology and Neuroimaging  [facility] [address] [place], [place] [address] [phone]

## 2016-04-11 ENCOUNTER — Other Ambulatory Visit (HOSPITAL_BASED_OUTPATIENT_CLINIC_OR_DEPARTMENT_OTHER): Payer: BC Managed Care – PPO

## 2016-04-11 ENCOUNTER — Other Ambulatory Visit: Payer: Self-pay | Admitting: Internal Medicine

## 2016-04-11 ENCOUNTER — Ambulatory Visit (HOSPITAL_BASED_OUTPATIENT_CLINIC_OR_DEPARTMENT_OTHER): Payer: BC Managed Care – PPO

## 2016-04-11 VITALS — BP 123/71 | HR 100 | Temp 97.2°F | Resp 18

## 2016-04-11 DIAGNOSIS — C7951 Secondary malignant neoplasm of bone: Secondary | ICD-10-CM

## 2016-04-11 DIAGNOSIS — C3411 Malignant neoplasm of upper lobe, right bronchus or lung: Secondary | ICD-10-CM

## 2016-04-11 DIAGNOSIS — Z5111 Encounter for antineoplastic chemotherapy: Secondary | ICD-10-CM

## 2016-04-11 LAB — COMPREHENSIVE METABOLIC PANEL
ALBUMIN: 2.7 g/dL — AB (ref 3.5–5.0)
ALK PHOS: 85 U/L (ref 40–150)
ALT: 42 U/L (ref 0–55)
ANION GAP: 11 meq/L (ref 3–11)
AST: 30 U/L (ref 5–34)
BUN: 23 mg/dL (ref 7.0–26.0)
CALCIUM: 9.1 mg/dL (ref 8.4–10.4)
CO2: 26 mEq/L (ref 22–29)
CREATININE: 1.1 mg/dL (ref 0.6–1.1)
Chloride: 105 mEq/L (ref 98–109)
EGFR: 55 mL/min/{1.73_m2} — ABNORMAL LOW (ref >90)
Glucose: 91 mg/dl (ref 70–140)
POTASSIUM: 4.2 meq/L (ref 3.5–5.1)
Sodium: 142 mEq/L (ref 136–145)
Total Bilirubin: 0.39 mg/dL (ref 0.20–1.20)
Total Protein: 7.1 g/dL (ref 6.4–8.3)

## 2016-04-11 LAB — CBC WITH DIFFERENTIAL/PLATELET
BASO%: 0.3 % (ref 0.0–2.0)
Basophils Absolute: 0 10*3/uL (ref 0.0–0.1)
EOS ABS: 0 10*3/uL (ref 0.0–0.5)
EOS%: 0.3 % (ref 0.0–7.0)
HEMATOCRIT: 28 % — AB (ref 34.8–46.6)
HEMOGLOBIN: 9.1 g/dL — AB (ref 11.6–15.9)
LYMPH#: 0.6 10*3/uL — AB (ref 0.9–3.3)
LYMPH%: 17.1 % (ref 14.0–49.7)
MCH: 30 pg (ref 25.1–34.0)
MCHC: 32.5 g/dL (ref 31.5–36.0)
MCV: 92.4 fL (ref 79.5–101.0)
MONO#: 0.7 10*3/uL (ref 0.1–0.9)
MONO%: 18.9 % — ABNORMAL HIGH (ref 0.0–14.0)
NEUT#: 2.2 10*3/uL (ref 1.5–6.5)
NEUT%: 63.4 % (ref 38.4–76.8)
PLATELETS: 161 10*3/uL (ref 145–400)
RBC: 3.03 10*6/uL — ABNORMAL LOW (ref 3.70–5.45)
RDW: 17.6 % — ABNORMAL HIGH (ref 11.2–14.5)
WBC: 3.5 10*3/uL — ABNORMAL LOW (ref 3.9–10.3)
nRBC: 0 % (ref 0–0)

## 2016-04-11 MED ORDER — PALONOSETRON HCL INJECTION 0.25 MG/5ML
0.2500 mg | Freq: Once | INTRAVENOUS | Status: AC
  Administered 2016-04-11: 0.25 mg via INTRAVENOUS

## 2016-04-11 MED ORDER — SODIUM CHLORIDE 0.9 % IV SOLN
294.4000 mg | Freq: Once | INTRAVENOUS | Status: AC
  Administered 2016-04-11: 290 mg via INTRAVENOUS
  Filled 2016-04-11: qty 29

## 2016-04-11 MED ORDER — DEXAMETHASONE SODIUM PHOSPHATE 100 MG/10ML IJ SOLN
10.0000 mg | Freq: Once | INTRAMUSCULAR | Status: AC
Start: 2016-04-11 — End: 2016-04-11
  Administered 2016-04-11: 10 mg via INTRAVENOUS
  Filled 2016-04-11: qty 1

## 2016-04-11 MED ORDER — PALONOSETRON HCL INJECTION 0.25 MG/5ML
INTRAVENOUS | Status: AC
  Filled 2016-04-11: qty 5

## 2016-04-11 MED ORDER — SODIUM CHLORIDE 0.9 % IV SOLN
365.0000 mg/m2 | Freq: Once | INTRAVENOUS | Status: AC
  Administered 2016-04-11: 600 mg via INTRAVENOUS
  Filled 2016-04-11: qty 20

## 2016-04-11 MED ORDER — DENOSUMAB 120 MG/1.7ML ~~LOC~~ SOLN
120.0000 mg | Freq: Once | SUBCUTANEOUS | Status: AC
  Administered 2016-04-11: 120 mg via SUBCUTANEOUS
  Filled 2016-04-11: qty 1.7

## 2016-04-11 MED ORDER — SODIUM CHLORIDE 0.9 % IV SOLN
Freq: Once | INTRAVENOUS | Status: AC
  Administered 2016-04-11: 15:00:00 via INTRAVENOUS

## 2016-04-11 MED ORDER — SODIUM CHLORIDE 0.9 % IV SOLN
200.0000 mg | Freq: Once | INTRAVENOUS | Status: AC
  Administered 2016-04-11: 200 mg via INTRAVENOUS
  Filled 2016-04-11: qty 8

## 2016-04-11 NOTE — Patient Instructions (Signed)
[facility] Discharge Instructions for Patients Receiving Chemotherapy  Today you received the following chemotherapy agents: Alimta, Carboplatin, Keytruda  To help prevent nausea and vomiting after your treatment, we encourage you to take your nausea medication: as directed.   If you develop nausea and vomiting that is not controlled by your nausea medication, call the clinic.   BELOW ARE SYMPTOMS THAT SHOULD BE REPORTED IMMEDIATELY:  *FEVER GREATER THAN 100.5 F  *CHILLS WITH OR WITHOUT FEVER  NAUSEA AND VOMITING THAT IS NOT CONTROLLED WITH YOUR NAUSEA MEDICATION  *UNUSUAL SHORTNESS OF BREATH  *UNUSUAL BRUISING OR BLEEDING  TENDERNESS IN MOUTH AND THROAT WITH OR WITHOUT PRESENCE OF ULCERS  *URINARY PROBLEMS  *BOWEL PROBLEMS  UNUSUAL RASH Items with * indicate a potential emergency and should be followed up as soon as possible.  Feel free to call the clinic you have any questions or concerns. The clinic phone number is [phone].  Please show the [place] at check-in to the Emergency Department and triage nurse.   Pemetrexed injection What is this medicine? PEMETREXED (PEM e TREX ed) is a chemotherapy drug. This medicine affects cells that are rapidly growing, such as cancer cells and cells in your mouth and stomach. It is usually used to treat lung cancers like non-small cell lung cancer and mesothelioma. It may also be used to treat other cancers. This medicine may be used for other purposes; ask your health care provider or pharmacist if you have questions. What should I tell my health care provider before I take this medicine? They need to know if you have any of these conditions: -if you frequently drink alcohol containing beverages -infection (especially a virus infection such as chickenpox, cold sores, or herpes) -kidney disease -liver disease -low blood counts, like low platelets, red bloods, or white blood cells -an unusual or allergic  reaction to pemetrexed, mannitol, other medicines, foods, dyes, or preservatives -pregnant or trying to get pregnant -breast-feeding How should I use this medicine? This drug is given as an infusion into a vein. It is administered in a hospital or clinic by a specially trained health care professional. Talk to your pediatrician regarding the use of this medicine in children. Special care may be needed. Overdosage: If you think you have taken too much of this medicine contact a poison control center or emergency room at once. NOTE: This medicine is only for you. Do not share this medicine with others. What if I miss a dose? It is important not to miss your dose. Call your doctor or health care professional if you are unable to keep an appointment. What may interact with this medicine? -aspirin and aspirin-like medicines -medicines to increase blood counts like filgrastim, pegfilgrastim, sargramostim -methotrexate -NSAIDS, medicines for pain and inflammation, like ibuprofen or naproxen -probenecid -pyrimethamine -vaccines Talk to your doctor or health care professional before taking any of these medicines: -acetaminophen -aspirin -ibuprofen -ketoprofen -naproxen This list may not describe all possible interactions. Give your health care provider a list of all the medicines, herbs, non-prescription drugs, or dietary supplements you use. Also tell them if you smoke, drink alcohol, or use illegal drugs. Some items may interact with your medicine. What should I watch for while using this medicine? Visit your doctor for checks on your progress. This drug may make you feel generally unwell. This is not uncommon, as chemotherapy can affect healthy cells as well as cancer cells. Report any side effects. Continue your course of treatment even though you  feel ill unless your doctor tells you to stop. In some cases, you may be given additional medicines to help with side effects. Follow all directions  for their use. Call your doctor or health care professional for advice if you get a fever, chills or sore throat, or other symptoms of a cold or flu. Do not treat yourself. This drug decreases your body's ability to fight infections. Try to avoid being around people who are sick. This medicine may increase your risk to bruise or bleed. Call your doctor or health care professional if you notice any unusual bleeding. Be careful brushing and flossing your teeth or using a toothpick because you may get an infection or bleed more easily. If you have any dental work done, tell your dentist you are receiving this medicine. Avoid taking products that contain aspirin, acetaminophen, ibuprofen, naproxen, or ketoprofen unless instructed by your doctor. These medicines may hide a fever. Call your doctor or health care professional if you get diarrhea or mouth sores. Do not treat yourself. To protect your kidneys, drink water or other fluids as directed while you are taking this medicine. Men and women must use effective birth control while taking this medicine. You may also need to continue using effective birth control for a time after stopping this medicine. Do not become pregnant while taking this medicine. Tell your doctor right away if you think that you or your partner might be pregnant. There is a potential for serious side effects to an unborn child. Talk to your health care professional or pharmacist for more information. Do not breast-feed an infant while taking this medicine. This medicine may lower sperm counts. What side effects may I notice from receiving this medicine? Side effects that you should report to your doctor or health care professional as soon as possible: -allergic reactions like skin rash, itching or hives, swelling of the face, lips, or tongue -low blood counts - this medicine may decrease the number of white blood cells, red blood cells and platelets. You may be at increased risk for  infections and bleeding. -signs of infection - fever or chills, cough, sore throat, pain or difficulty passing urine -signs of decreased platelets or bleeding - bruising, pinpoint red spots on the skin, black, tarry stools, blood in the urine -signs of decreased red blood cells - unusually weak or tired, fainting spells, lightheadedness -breathing problems, like a dry cough -changes in emotions or moods -chest pain -confusion -diarrhea -high blood pressure -mouth or throat sores or ulcers -pain, swelling, warmth in the leg -pain on swallowing -swelling of the ankles, feet, hands -trouble passing urine or change in the amount of urine -vomiting -yellowing of the eyes or skin Side effects that usually do not require medical attention (report to your doctor or health care professional if they continue or are bothersome): -hair loss -loss of appetite -nausea -stomach upset This list may not describe all possible side effects. Call your doctor for medical advice about side effects. You may report side effects to FDA at 1-800-FDA-1088. Where should I keep my medicine? This drug is given in a hospital or clinic and will not be stored at home. NOTE: This sheet is a summary. It may not cover all possible information. If you have questions about this medicine, talk to your doctor, pharmacist, or health care provider.    2016, Elsevier/Gold Standard. (2008-02-23 13:24:03)   Carboplatin injection What is this medicine? CARBOPLATIN (KAR boe pla tin) is a chemotherapy drug. It targets fast  dividing cells, like cancer cells, and causes these cells to die. This medicine is used to treat ovarian cancer and many other cancers. This medicine may be used for other purposes; ask your health care provider or pharmacist if you have questions. What should I tell my health care provider before I take this medicine? They need to know if you have any of these conditions: -blood disorders -hearing  problems -kidney disease -recent or ongoing radiation therapy -an unusual or allergic reaction to carboplatin, cisplatin, other chemotherapy, other medicines, foods, dyes, or preservatives -pregnant or trying to get pregnant -breast-feeding How should I use this medicine? This drug is usually given as an infusion into a vein. It is administered in a hospital or clinic by a specially trained health care professional. Talk to your pediatrician regarding the use of this medicine in children. Special care may be needed. Overdosage: If you think you have taken too much of this medicine contact a poison control center or emergency room at once. NOTE: This medicine is only for you. Do not share this medicine with others. What if I miss a dose? It is important not to miss a dose. Call your doctor or health care professional if you are unable to keep an appointment. What may interact with this medicine? -medicines for seizures -medicines to increase blood counts like filgrastim, pegfilgrastim, sargramostim -some antibiotics like amikacin, gentamicin, neomycin, streptomycin, tobramycin -vaccines Talk to your doctor or health care professional before taking any of these medicines: -acetaminophen -aspirin -ibuprofen -ketoprofen -naproxen This list may not describe all possible interactions. Give your health care provider a list of all the medicines, herbs, non-prescription drugs, or dietary supplements you use. Also tell them if you smoke, drink alcohol, or use illegal drugs. Some items may interact with your medicine. What should I watch for while using this medicine? Your condition will be monitored carefully while you are receiving this medicine. You will need important blood work done while you are taking this medicine. This drug may make you feel generally unwell. This is not uncommon, as chemotherapy can affect healthy cells as well as cancer cells. Report any side effects. Continue your course  of treatment even though you feel ill unless your doctor tells you to stop. In some cases, you may be given additional medicines to help with side effects. Follow all directions for their use. Call your doctor or health care professional for advice if you get a fever, chills or sore throat, or other symptoms of a cold or flu. Do not treat yourself. This drug decreases your body's ability to fight infections. Try to avoid being around people who are sick. This medicine may increase your risk to bruise or bleed. Call your doctor or health care professional if you notice any unusual bleeding. Be careful brushing and flossing your teeth or using a toothpick because you may get an infection or bleed more easily. If you have any dental work done, tell your dentist you are receiving this medicine. Avoid taking products that contain aspirin, acetaminophen, ibuprofen, naproxen, or ketoprofen unless instructed by your doctor. These medicines may hide a fever. Do not become pregnant while taking this medicine. Women should inform their doctor if they wish to become pregnant or think they might be pregnant. There is a potential for serious side effects to an unborn child. Talk to your health care professional or pharmacist for more information. Do not breast-feed an infant while taking this medicine. What side effects may I notice from receiving  this medicine? Side effects that you should report to your doctor or health care professional as soon as possible: -allergic reactions like skin rash, itching or hives, swelling of the face, lips, or tongue -signs of infection - fever or chills, cough, sore throat, pain or difficulty passing urine -signs of decreased platelets or bleeding - bruising, pinpoint red spots on the skin, black, tarry stools, nosebleeds -signs of decreased red blood cells - unusually weak or tired, fainting spells, lightheadedness -breathing problems -changes in hearing -changes in  vision -chest pain -high blood pressure -low blood counts - This drug may decrease the number of white blood cells, red blood cells and platelets. You may be at increased risk for infections and bleeding. -nausea and vomiting -pain, swelling, redness or irritation at the injection site -pain, tingling, numbness in the hands or feet -problems with balance, talking, walking -trouble passing urine or change in the amount of urine Side effects that usually do not require medical attention (report to your doctor or health care professional if they continue or are bothersome): -hair loss -loss of appetite -metallic taste in the mouth or changes in taste This list may not describe all possible side effects. Call your doctor for medical advice about side effects. You may report side effects to FDA at 1-800-FDA-1088. Where should I keep my medicine? This drug is given in a hospital or clinic and will not be stored at home. NOTE: This sheet is a summary. It may not cover all possible information. If you have questions about this medicine, talk to your doctor, pharmacist, or health care provider.    2016, Elsevier/Gold Standard. (2007-10-27 14:38:05)    Pembrolizumab injection What is this medicine? PEMBROLIZUMAB (pem broe liz ue mab) is a monoclonal antibody. It is used to treat melanoma and non-small cell lung cancer. This medicine may be used for other purposes; ask your health care provider or pharmacist if you have questions. What should I tell my health care provider before I take this medicine? They need to know if you have any of these conditions: -diabetes -immune system problems -inflammatory bowel disease -liver disease -lung or breathing disease -lupus -an unusual or allergic reaction to pembrolizumab, other medicines, foods, dyes, or preservatives -pregnant or trying to get pregnant -breast-feeding How should I use this medicine? This medicine is for infusion into a vein. It  is given by a health care professional in a hospital or clinic setting. A special MedGuide will be given to you before each treatment. Be sure to read this information carefully each time. Talk to your pediatrician regarding the use of this medicine in children. Special care may be needed. Overdosage: If you think you have taken too much of this medicine contact a poison control center or emergency room at once. NOTE: This medicine is only for you. Do not share this medicine with others. What if I miss a dose? It is important not to miss your dose. Call your doctor or health care professional if you are unable to keep an appointment. What may interact with this medicine? Interactions have not been studied. Give your health care provider a list of all the medicines, herbs, non-prescription drugs, or dietary supplements you use. Also tell them if you smoke, drink alcohol, or use illegal drugs. Some items may interact with your medicine. This list may not describe all possible interactions. Give your health care provider a list of all the medicines, herbs, non-prescription drugs, or dietary supplements you use. Also tell them if  you smoke, drink alcohol, or use illegal drugs. Some items may interact with your medicine. What should I watch for while using this medicine? Your condition will be monitored carefully while you are receiving this medicine. You may need blood work done while you are taking this medicine. Do not become pregnant while taking this medicine or for 4 months after stopping it. Women should inform their doctor if they wish to become pregnant or think they might be pregnant. There is a potential for serious side effects to an unborn child. Talk to your health care professional or pharmacist for more information. Do not breast-feed an infant while taking this medicine or for 4 months after the last dose. What side effects may I notice from receiving this medicine? Side effects that you  should report to your doctor or health care professional as soon as possible: -allergic reactions like skin rash, itching or hives, swelling of the face, lips, or tongue -bloody or black, tarry stools -breathing problems -change in the amount of urine -changes in vision -chest pain -chills -dark urine -dizziness or feeling faint or lightheaded -fast or irregular heartbeat -fever -flushing -hair loss -muscle pain -muscle weakness -persistent headache -signs and symptoms of high blood sugar such as dizziness; dry mouth; dry skin; fruity breath; nausea; stomach pain; increased hunger or thirst; increased urination -signs and symptoms of liver injury like dark urine, light-colored stools, loss of appetite, nausea, right upper belly pain, yellowing of the eyes or skin -stomach pain -weight loss Side effects that usually do not require medical attention (Report these to your doctor or health care professional if they continue or are bothersome.):constipation -cough -diarrhea -joint pain -tiredness This list may not describe all possible side effects. Call your doctor for medical advice about side effects. You may report side effects to FDA at 1-800-FDA-1088. Where should I keep my medicine? This drug is given in a hospital or clinic and will not be stored at home. NOTE: This sheet is a summary. It may not cover all possible information. If you have questions about this medicine, talk to your doctor, pharmacist, or health care provider.    2016, Elsevier/Gold Standard. (2014-09-20 17:24:19)

## 2016-04-11 NOTE — Telephone Encounter (Signed)
I called patient. 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 The patient remains somewhat fatigued, she denies any episodes of sudden confusion or jerking. She remains on Keppra 500 mg twice daily.  Lumbar puncture was ordered on August 8, the patient has not heard anything about scheduling, the orders are in Epic.

## 2016-04-15 ENCOUNTER — Encounter: Payer: Self-pay | Admitting: *Deleted

## 2016-04-15 ENCOUNTER — Telehealth: Payer: Self-pay | Admitting: *Deleted

## 2016-04-15 NOTE — Progress Notes (Signed)
Oncology Nurse Navigator Documentation  Oncology Nurse Navigator Flowsheets 04/15/2016  Navigator Encounter Type Other/per Dr. Julien Nordmann I called [facility] to get results of patient's recent lab work.  I left vm message with my name and phone number to call.   Treatment Phase Treatment  Barriers/Navigation Needs Coordination of Care  Interventions Coordination of Care  Coordination of Care Other  Acuity Level 2  Acuity Level 2 Other  Time Spent with Patient 30

## 2016-04-15 NOTE — Telephone Encounter (Signed)
Oncology Nurse Navigator Documentation  Oncology Nurse Navigator Flowsheets 04/15/2016  Navigator Encounter Type Telephone;Other/Dr. Julien Nordmann updated me that the [id number] not identified on Mardi's report.  He asked that I call [facility] for results.  After multiple attempts and several emails to rep, I was able to get an update on results.  [facility] stated results are not completed yet.  I updated Dr. MJulien Nordmann I then called DZaniyaand updated her as well.    Telephone Outgoing Call  Treatment Phase Treatment  Barriers/Navigation Needs Coordination of Care  Interventions Coordination of Care  Coordination of Care Other  Acuity Level 2  Acuity Level 2 Other  Time Spent with Patient 60

## 2016-04-17 ENCOUNTER — Encounter: Payer: Self-pay | Admitting: *Deleted

## 2016-04-17 ENCOUNTER — Telehealth: Payer: Self-pay | Admitting: *Deleted

## 2016-04-17 NOTE — Progress Notes (Signed)
Oncology Nurse Navigator Documentation  Oncology Nurse Navigator Flowsheets 04/17/2016  Navigator Encounter Type Other/I followed up with [facility] to get test results.  I was unable to get through and was hung up on after 30 minutes.  I notified rep to help assist.   Treatment Phase Treatment  Barriers/Navigation Needs Coordination of Care  Interventions Coordination of Care  Acuity Level 2  Acuity Level 2 Other  Time Spent with Patient 30

## 2016-04-17 NOTE — Telephone Encounter (Signed)
Oncology Nurse Navigator Documentation  Oncology Nurse Navigator Flowsheets 04/17/2016  Navigator Encounter Type Telephone/I received a call from [facility].  She stated not enough tumor buren or insufficient material for testing or just negative results.   Final results out today.  I updated Dr. Julien Nordmann.  He stated for me to update patient.  I called and spoke with patient and partner.  I updated.  They were thankful for the call.   Telephone Outgoing Call  Treatment Phase Treatment  Barriers/Navigation Needs Education  Education Other  Interventions Education Method  Education Method Verbal  Acuity Level 1  Acuity Level 1 Minimal follow up required  Time Spent with Patient 15

## 2016-04-18 ENCOUNTER — Other Ambulatory Visit (HOSPITAL_BASED_OUTPATIENT_CLINIC_OR_DEPARTMENT_OTHER): Payer: BC Managed Care – PPO

## 2016-04-18 DIAGNOSIS — C7951 Secondary malignant neoplasm of bone: Secondary | ICD-10-CM

## 2016-04-18 DIAGNOSIS — C3411 Malignant neoplasm of upper lobe, right bronchus or lung: Secondary | ICD-10-CM | POA: Diagnosis not present

## 2016-04-18 DIAGNOSIS — Z5111 Encounter for antineoplastic chemotherapy: Secondary | ICD-10-CM

## 2016-04-18 LAB — CBC WITH DIFFERENTIAL/PLATELET
BASO%: 0.8 % (ref 0.0–2.0)
BASOS ABS: 0 10*3/uL (ref 0.0–0.1)
EOS%: 0.5 % (ref 0.0–7.0)
Eosinophils Absolute: 0 10*3/uL (ref 0.0–0.5)
HEMATOCRIT: 26.6 % — AB (ref 34.8–46.6)
HEMOGLOBIN: 8.9 g/dL — AB (ref 11.6–15.9)
LYMPH#: 0.1 10*3/uL — AB (ref 0.9–3.3)
LYMPH%: 8.6 % — ABNORMAL LOW (ref 14.0–49.7)
MCH: 30.3 pg (ref 25.1–34.0)
MCHC: 33.5 g/dL (ref 31.5–36.0)
MCV: 90.5 fL (ref 79.5–101.0)
MONO#: 0.3 10*3/uL (ref 0.1–0.9)
MONO%: 22.1 % — ABNORMAL HIGH (ref 0.0–14.0)
NEUT#: 1 10*3/uL — ABNORMAL LOW (ref 1.5–6.5)
NEUT%: 68 % (ref 38.4–76.8)
PLATELETS: 152 10*3/uL (ref 145–400)
RBC: 2.93 10*6/uL — ABNORMAL LOW (ref 3.70–5.45)
RDW: 17.5 % — AB (ref 11.2–14.5)
WBC: 1.5 10*3/uL — ABNORMAL LOW (ref 3.9–10.3)

## 2016-04-18 LAB — COMPREHENSIVE METABOLIC PANEL
ALBUMIN: 2.6 g/dL — AB (ref 3.5–5.0)
ALT: 40 U/L (ref 0–55)
AST: 30 U/L (ref 5–34)
Alkaline Phosphatase: 73 U/L (ref 40–150)
Anion Gap: 11 mEq/L (ref 3–11)
BILIRUBIN TOTAL: 0.36 mg/dL (ref 0.20–1.20)
BUN: 29.2 mg/dL — AB (ref 7.0–26.0)
CALCIUM: 9.4 mg/dL (ref 8.4–10.4)
CO2: 25 mEq/L (ref 22–29)
CREATININE: 0.8 mg/dL (ref 0.6–1.1)
Chloride: 105 mEq/L (ref 98–109)
EGFR: 76 mL/min/{1.73_m2} — ABNORMAL LOW (ref >90)
Glucose: 151 mg/dl — ABNORMAL HIGH (ref 70–140)
Potassium: 4 mEq/L (ref 3.5–5.1)
Sodium: 141 mEq/L (ref 136–145)
Total Protein: 6.9 g/dL (ref 6.4–8.3)

## 2016-04-19 ENCOUNTER — Encounter: Payer: Self-pay | Admitting: *Deleted

## 2016-04-19 NOTE — Progress Notes (Signed)
Oncology Nurse Navigator Documentation  Oncology Nurse Navigator Flowsheets 04/19/2016  Navigator Encounter Type Other/I received fax of results from [facility].  Dr. Julien Nordmann updated. Results will be scanned into EMR  Treatment Phase Treatment  Barriers/Navigation Needs Coordination of Care  Interventions Coordination of Care  Coordination of Care Other  Acuity Level 2  Acuity Level 2 Other  Time Spent with Patient 28

## 2016-04-20 ENCOUNTER — Other Ambulatory Visit: Payer: Self-pay | Admitting: Internal Medicine

## 2016-04-25 ENCOUNTER — Other Ambulatory Visit (HOSPITAL_BASED_OUTPATIENT_CLINIC_OR_DEPARTMENT_OTHER): Payer: BC Managed Care – PPO

## 2016-04-25 DIAGNOSIS — C7951 Secondary malignant neoplasm of bone: Secondary | ICD-10-CM

## 2016-04-25 DIAGNOSIS — Z79899 Other long term (current) drug therapy: Secondary | ICD-10-CM

## 2016-04-25 DIAGNOSIS — C3411 Malignant neoplasm of upper lobe, right bronchus or lung: Secondary | ICD-10-CM

## 2016-04-25 DIAGNOSIS — Z5111 Encounter for antineoplastic chemotherapy: Secondary | ICD-10-CM

## 2016-04-25 LAB — CBC WITH DIFFERENTIAL/PLATELET
BASO%: 0.1 % (ref 0.0–2.0)
Basophils Absolute: 0 10*3/uL (ref 0.0–0.1)
EOS ABS: 0 10*3/uL (ref 0.0–0.5)
EOS%: 0.3 % (ref 0.0–7.0)
HCT: 25.9 % — ABNORMAL LOW (ref 34.8–46.6)
HGB: 8.6 g/dL — ABNORMAL LOW (ref 11.6–15.9)
LYMPH%: 5.4 % — AB (ref 14.0–49.7)
MCH: 30.4 pg (ref 25.1–34.0)
MCHC: 33.4 g/dL (ref 31.5–36.0)
MCV: 91.3 fL (ref 79.5–101.0)
MONO#: 0.6 10*3/uL (ref 0.1–0.9)
MONO%: 13.1 % (ref 0.0–14.0)
NEUT%: 81.1 % — AB (ref 38.4–76.8)
NEUTROS ABS: 3.6 10*3/uL (ref 1.5–6.5)
Platelets: 77 10*3/uL — ABNORMAL LOW (ref 145–400)
RBC: 2.83 10*6/uL — AB (ref 3.70–5.45)
RDW: 19.1 % — ABNORMAL HIGH (ref 11.2–14.5)
WBC: 4.4 10*3/uL (ref 3.9–10.3)
lymph#: 0.2 10*3/uL — ABNORMAL LOW (ref 0.9–3.3)

## 2016-04-25 LAB — COMPREHENSIVE METABOLIC PANEL
ALT: 37 U/L (ref 0–55)
ANION GAP: 11 meq/L (ref 3–11)
AST: 25 U/L (ref 5–34)
Albumin: 2.9 g/dL — ABNORMAL LOW (ref 3.5–5.0)
Alkaline Phosphatase: 88 U/L (ref 40–150)
BILIRUBIN TOTAL: 0.42 mg/dL (ref 0.20–1.20)
BUN: 30.4 mg/dL — ABNORMAL HIGH (ref 7.0–26.0)
CHLORIDE: 107 meq/L (ref 98–109)
CO2: 25 meq/L (ref 22–29)
Calcium: 9.2 mg/dL (ref 8.4–10.4)
Creatinine: 0.8 mg/dL (ref 0.6–1.1)
EGFR: 82 mL/min/{1.73_m2} — AB (ref >90)
GLUCOSE: 121 mg/dL (ref 70–140)
Potassium: 4.1 mEq/L (ref 3.5–5.1)
SODIUM: 142 meq/L (ref 136–145)
TOTAL PROTEIN: 7.2 g/dL (ref 6.4–8.3)

## 2016-04-25 LAB — TSH: TSH: 1.546 m(IU)/L (ref 0.308–3.960)

## 2016-04-30 ENCOUNTER — Telehealth: Payer: Self-pay | Admitting: Neurology

## 2016-04-30 ENCOUNTER — Encounter: Payer: Self-pay | Admitting: Neurology

## 2016-04-30 MED ORDER — LEVETIRACETAM 500 MG PO TABS: ORAL_TABLET | ORAL | 2 refills | Status: DC

## 2016-04-30 NOTE — Telephone Encounter (Signed)
I called patient. The patient had an event associated with difficulty speaking lasting about an hour, this could have represented a seizure. We have set her up for lumbar puncture, she decided that she did not wish to have this study done. We will go up on the [place] slightly, she indicates that she is drowsy on the medication already, we'll go to 500 mg in the morning and 750 mg in the evening.

## 2016-05-01 ENCOUNTER — Telehealth: Payer: Self-pay | Admitting: *Deleted

## 2016-05-01 ENCOUNTER — Other Ambulatory Visit (HOSPITAL_BASED_OUTPATIENT_CLINIC_OR_DEPARTMENT_OTHER): Payer: BC Managed Care – PPO

## 2016-05-01 ENCOUNTER — Ambulatory Visit (HOSPITAL_BASED_OUTPATIENT_CLINIC_OR_DEPARTMENT_OTHER): Payer: BC Managed Care – PPO

## 2016-05-01 ENCOUNTER — Telehealth: Payer: Self-pay | Admitting: Internal Medicine

## 2016-05-01 ENCOUNTER — Encounter: Payer: Self-pay | Admitting: Internal Medicine

## 2016-05-01 ENCOUNTER — Ambulatory Visit (HOSPITAL_BASED_OUTPATIENT_CLINIC_OR_DEPARTMENT_OTHER): Payer: BC Managed Care – PPO | Admitting: Internal Medicine

## 2016-05-01 VITALS — BP 106/65 | HR 113 | Temp 97.6°F | Resp 18 | Ht 67.0 in | Wt 130.0 lb

## 2016-05-01 DIAGNOSIS — C7951 Secondary malignant neoplasm of bone: Secondary | ICD-10-CM | POA: Diagnosis not present

## 2016-05-01 DIAGNOSIS — C7931 Secondary malignant neoplasm of brain: Secondary | ICD-10-CM | POA: Diagnosis not present

## 2016-05-01 DIAGNOSIS — C3411 Malignant neoplasm of upper lobe, right bronchus or lung: Secondary | ICD-10-CM | POA: Diagnosis not present

## 2016-05-01 DIAGNOSIS — R000 Tachycardia, unspecified: Secondary | ICD-10-CM

## 2016-05-01 DIAGNOSIS — D6959 Other secondary thrombocytopenia: Secondary | ICD-10-CM | POA: Diagnosis not present

## 2016-05-01 DIAGNOSIS — Z5111 Encounter for antineoplastic chemotherapy: Secondary | ICD-10-CM

## 2016-05-01 DIAGNOSIS — E860 Dehydration: Secondary | ICD-10-CM

## 2016-05-01 DIAGNOSIS — D61810 Antineoplastic chemotherapy induced pancytopenia: Secondary | ICD-10-CM

## 2016-05-01 LAB — COMPREHENSIVE METABOLIC PANEL
ALBUMIN: 2.8 g/dL — AB (ref 3.5–5.0)
ALK PHOS: 82 U/L (ref 40–150)
ALT: 24 U/L (ref 0–55)
ANION GAP: 12 meq/L — AB (ref 3–11)
AST: 21 U/L (ref 5–34)
BILIRUBIN TOTAL: 0.45 mg/dL (ref 0.20–1.20)
BUN: 27.3 mg/dL — ABNORMAL HIGH (ref 7.0–26.0)
CO2: 20 meq/L — AB (ref 22–29)
Calcium: 9 mg/dL (ref 8.4–10.4)
Chloride: 107 mEq/L (ref 98–109)
Creatinine: 0.8 mg/dL (ref 0.6–1.1)
EGFR: 80 mL/min/{1.73_m2} — AB (ref >90)
Glucose: 167 mg/dl — ABNORMAL HIGH (ref 70–140)
POTASSIUM: 4 meq/L (ref 3.5–5.1)
Sodium: 140 mEq/L (ref 136–145)
TOTAL PROTEIN: 7 g/dL (ref 6.4–8.3)

## 2016-05-01 LAB — CBC WITH DIFFERENTIAL/PLATELET
BASO%: 0 % (ref 0.0–2.0)
BASOS ABS: 0 10*3/uL (ref 0.0–0.1)
EOS ABS: 0 10*3/uL (ref 0.0–0.5)
EOS%: 0.5 % (ref 0.0–7.0)
HCT: 24.7 % — ABNORMAL LOW (ref 34.8–46.6)
HGB: 8.1 g/dL — ABNORMAL LOW (ref 11.6–15.9)
LYMPH#: 0.2 10*3/uL — AB (ref 0.9–3.3)
LYMPH%: 5.5 % — AB (ref 14.0–49.7)
MCH: 30.7 pg (ref 25.1–34.0)
MCHC: 32.8 g/dL (ref 31.5–36.0)
MCV: 93.6 fL (ref 79.5–101.0)
MONO#: 0.6 10*3/uL (ref 0.1–0.9)
MONO%: 14.5 % — AB (ref 0.0–14.0)
NEUT%: 79.5 % — ABNORMAL HIGH (ref 38.4–76.8)
NEUTROS ABS: 3 10*3/uL (ref 1.5–6.5)
PLATELETS: 57 10*3/uL — AB (ref 145–400)
RBC: 2.64 10*6/uL — AB (ref 3.70–5.45)
RDW: 20 % — AB (ref 11.2–14.5)
WBC: 3.8 10*3/uL — AB (ref 3.9–10.3)

## 2016-05-01 MED ORDER — SODIUM CHLORIDE 0.9 % IV SOLN
INTRAVENOUS | Status: AC
  Administered 2016-05-01: 12:00:00 via INTRAVENOUS

## 2016-05-01 NOTE — Telephone Encounter (Signed)
Per LOS I have moved chemo from this week to nwxt week

## 2016-05-01 NOTE — Patient Instructions (Signed)

## 2016-05-01 NOTE — Progress Notes (Signed)
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 Lisbeth Renshaw. 2) Gilotrif 40 mg by mouth daily. Status post 5 months of treatment. 3) stereotactic radiotherapy to the enlarging right upper lobe lung mass under the care of Dr. Lisbeth Renshaw. 4) stereotactic radiotherapy to another right upper lobe lung mass as well as solitary brain metastases. 5) Gilotrif 30 mg by mouth daily. First dose started 08/31/2015. Status post 6 months of treatment, discontinued secondary to disease progression. 6) palliative radiotherapy to the progressive lesion in the right side of the chest as well as the metastatic bone lesion involving C6 and C7 vertebrae with epidural tumor in the lateral recess as well as palliative radiotherapy to T8-the in the spine, under the care of Dr. Lisbeth Renshaw completed on 03/04/2016.  CURRENT THERAPY:  1) Systemic chemotherapy with carboplatin for AUC of 5, Alimta 500 MG/M2 and Ketruda (pembrolizumab) 200 MG/M2 every 3 weeks. First dose 03/13/2016. 2) Xgeva 120 mg subcutaneously every 4 weeks. First dose 05/01/2015.  INTERVAL HISTORY: Laura Walter 65 y.o. female returns to the clinic today for follow-up visit accompanied by her partner. The patient tolerated the second cycle of her systemic chemotherapy little bit better compared to the first one. She continues to have weakness and fatigue. She had any episodes of confusion and she was advised by Dr. Jannifer Franklin to increase her dose of Keppra to 750 mg by mouth daily.  She has no episodes since that time. She denied having any bleeding issues. She denied having any significant chest pain, shortness of breath, cough or hemoptysis. She has no nausea or vomiting. She denied having any significant weight loss or night sweats. She has no fever or chills. She is here today for evaluation and repeat blood work before starting cycle #3.  MEDICAL HISTORY: Past Medical History:  Diagnosis Date  . Allergy    seasonal  . Bone cancer ([place]) 01/10/15 PET   lytic lesion lt 1st rib,T9,L3 Lt acetabulum  . Brain cancer ([place]) 01/13/15   MRI multiple small brain mets 10 individual lesions  . Bronchitis   . H/O reactive hypoglycemia   . IBS (irritable bowel syndrome)   . Lung cancer ([place])    stageIV nscca rul  . Osteopenia   . Paronychia 11/17/2015  . Pneumonia 2009 & 2011  . S/P radiation therapy completed 02/08/15   whole brain ,spine  . S/P radiation therapy completed 11/27/15   lung    ALLERGIES:  is allergic to sulfonamide derivatives.  MEDICATIONS:  Current Outpatient Prescriptions  Medication Sig Dispense Refill  . dexamethasone (DECADRON) 4 MG tablet Take 1 tablet (4 mg total) by mouth 4 (four) times daily. 120 tablet 0  . FeFum-FePoly-FA-B Cmp-C-Biot (INTEGRA PLUS) CAPS Take 1 capsule by mouth every morning. 30 capsule 2  . folic acid (FOLVITE) 1 MG tablet Take 1 tablet (1 mg total) by mouth daily. 30 tablet 4  . HYDROcodone-acetaminophen (NORCO) 5-325 MG tablet Take 1-2 tablets by mouth every 4 (four) hours as needed for moderate pain. [place]  tablet 0  . ibuprofen (ADVIL,MOTRIN) 200 MG tablet Take 200 mg by mouth every 6 (six) hours as needed for moderate pain. Reported on 12/19/2015    . levETIRAcetam (KEPPRA) 500 MG tablet 1 tablet in the morning and 1.5 tablets in the evening 80 tablet 2  . loperamide (IMODIUM) 2 MG capsule Take 2 mg by mouth as needed for diarrhea or loose stools (Up to 8 times daily, if needed.). Reported on 12/19/2015    . Nutritional Supplements  (FEEDING SUPPLEMENT, OSMOLITE 1.5 CAL,) LIQD Place 237 mLs into feeding tube 4 (four) times daily. (Patient taking differently: Place 237 mLs into feeding tube 2 (two) times daily. ) 958 mL 12  . OLANZapine (ZYPREXA) 10 MG tablet TAKE 1 TABLET (10 MG TOTAL) BY MOUTH AT BEDTIME. (INS PAYS 12/13/2015) 30 tablet 0  . traMADol (ULTRAM) 50 MG tablet Take 1 tablet (50 mg total) by mouth every 6 (six) hours as needed. 60 tablet 0   Current Facility-Administered Medications  Medication Dose Route Frequency Provider Last Rate Last Dose  . 0.9 %  sodium chloride infusion   Intravenous Continuous Curt Bears, MD        SURGICAL HISTORY:  Past Surgical History:  Procedure Laterality Date  . COLONOSCOPY  2009   Negative; [facility]  . ESOPHAGOGASTRODUODENOSCOPY (EGD) WITH PROPOFOL N/A 04/11/2015   Procedure: ESOPHAGOGASTRODUODENOSCOPY (EGD) WITH PROPOFOL;  Surgeon: Carol Ada, MD;  Location: [facility];  Service: Endoscopy;  Laterality: N/A;  . PEG PLACEMENT N/A 04/11/2015   Procedure: PERCUTANEOUS ENDOSCOPIC GASTROSTOMY (PEG) PLACEMENT;  Surgeon: Carol Ada, MD;  Location: [facility];  Service: Endoscopy;  Laterality: N/A;  . TONSILLECTOMY AND ADENOIDECTOMY    . WISDOM TOOTH EXTRACTION      REVIEW OF SYSTEMS:  Constitutional: positive for anorexia, fatigue and weight loss Eyes: negative Ears, nose, mouth, throat, and face: negative Respiratory: positive for dyspnea on exertion Cardiovascular: negative Gastrointestinal: negative Genitourinary:negative Integument/breast: negative Hematologic/lymphatic: negative Musculoskeletal:positive for muscle weakness Neurological: negative Behavioral/Psych: negative Endocrine: negative Allergic/Immunologic: negative   PHYSICAL EXAMINATION: General appearance: alert, cooperative, fatigued and no distress Head: Normocephalic, without obvious abnormality, Several areas of bruises and ecchymosis on the right side of the face Neck: no adenopathy, no  JVD, supple, symmetrical, trachea midline and thyroid not enlarged, symmetric, no tenderness/mass/nodules Lymph nodes: Cervical, supraclavicular, and axillary nodes normal. Resp: clear to auscultation bilaterally Back: Tenderness to palpation at the lower thoracic and lumbar vertebrae. Cardio: regular rate and rhythm, S1, S2 normal, no murmur, click, rub or gallop GI: soft, non-tender; bowel sounds normal; no masses,  no organomegaly Extremities: extremities normal, atraumatic, no cyanosis or edema Neurologic: Alert and oriented X 3, normal strength and tone. Normal symmetric reflexes. Normal coordination and gait    ECOG PERFORMANCE STATUS: 1 - Symptomatic but completely ambulatory  Blood pressure 106/65, pulse (!) 113, temperature 97.6 F (36.4 C), temperature source Oral, resp. rate 18, height 5' 7"  (1.702 m), weight 130 lb (59 kg), SpO2 99 %.  LABORATORY DATA: Lab Results  Component Value Date   WBC 3.8 (L) 05/01/2016   HGB 8.1 (L) 05/01/2016   HCT 24.7 (L) 05/01/2016   MCV 93.6 05/01/2016   PLT 57 (L) 05/01/2016      Chemistry      Component Value Date/Time   NA 140 05/01/2016 1024   K 4.0 05/01/2016 1024   CL 105 03/09/2016 2238   CO2 20 (L) 05/01/2016 1024   BUN 27.3 (H) 05/01/2016 1024   CREATININE 0.8 05/01/2016 1024  Component Value Date/Time   CALCIUM 9.0 05/01/2016 1024   ALKPHOS 82 05/01/2016 1024   AST 21 05/01/2016 1024   ALT 24 05/01/2016 1024   BILITOT 0.45 05/01/2016 1024       RADIOGRAPHIC STUDIES: No results found.  ASSESSMENT AND PLAN: This is a very pleasant 65 years old never smoker white female recently diagnosed with stage IV non-small cell lung cancer, adenocarcinoma with positive EGFR mutation with deletion in exon 19 presented with large right upper lobe lung mass in addition to bilateral mediastinal, supraclavicular lymphadenopathy as well as bone and multiple brain metastasis diagnosed in June 2016.  The patient completed whole brain  irradiation under the care of Dr. Lisbeth Renshaw. She was started on treatment with Gilotrif 40 mg by mouth daily status post 5 months but this was discontinued secondary to intolerance with significant bony contact, skin rash in the sacral area as well as few episodes of diarrhea  The patient was started on Gilotrif 30 mg by mouth daily since 08/31/2015. She is tolerating it much better with the reduced dose was less adverse effects. Unfortunately the recent CT scan of the chest, abdomen and pelvis showed interval increase in the size of the right upper lobe lung mass compatible with disease progression. She is currently undergoing stereotactic radiotherapy to the enlarging right upper lobe lung mass. Molecular studies from blood test as well as CT-guided core biopsy of the enlarging right upper lobe lung mass showed no evidence for T790M resistant mutation. She tolerated her treatment with Gilotrif fairly well. Unfortunately the recent CT scan of the chest, abdomen and pelvis showed mixed response with new pleural based nodule adjacent to the previously treated right upper lobe mass. She underwent palliative radiotherapy to the new pleural-based nodule under the care of Dr. Lisbeth Renshaw. She is also undergoing stereotactic radiotherapy to new solitary brain metastasis. She also underwent palliative radiotherapy to the lesion in the cervical spines.  The patient was started on systemic chemotherapy with carboplatin, Alimta and Ketruda (pembrolizumab) status post 1 cycle. She has rough time with the first cycle of her treatment with significant fatigue and weakness as well as severe pancytopenia requiring PRBCs and platelet transfusion. Platelets count 60,000 today and the patient is still have pancytopenia. I recommended for her to delay the start of cycle #2 x 1 week until improvement of her count. I will reduce her dose of chemotherapy to carboplatin for AUC of 4 and Alimta 375 MG/M2 in addition to the standard dose  of Ketruda (pembrolizumab) 200 mg every 3 weeks starting from cycle #2. The molecular studies performed at least 4 times now showed no evidence for T790M resistant mutation.  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 I will delay the start of cycle #3 by 1 week until improvement of her platelets count. I also discussed with the patient and her partner consideration of palliative care and hospice referral at this point but the patient is not interested in hospice referral at this point. I will arrange for the patient to have repeat CT scan of the chest, abdomen and pelvis in 4 weeks for restaging of her disease. For the dehydration and tachycardia, I will arrange for the patient to receive 1 L of normal saline today. For the metastatic bone disease, I continue the patient on Xgeva 120 mg subcutaneously on monthly basis. The patient was also advised to increased her intake of calcium supplement and vitamin D.  For the nausea, she will continue on Zyprexa 10 mg by mouth daily at bedtime.  For pain management, she would continue on tramadol 50 mg by mouth every 6 hours as needed for pain. For the anemia, I started the patient on Integra +1 capsule by mouth daily.  She was advised to call immediately if she has any concerning symptoms in the interval. The patient voices understanding of current disease status and treatment options and is in agreement with the current care plan.  All questions were answered. The patient knows to call the clinic with any problems, questions or concerns. We can certainly see the patient much sooner if necessary.  Disclaimer: This note was dictated with voice recognition software. Similar sounding words can inadvertently be transcribed and may not be corrected upon review.

## 2016-05-01 NOTE — Telephone Encounter (Signed)
Keppra rx printed, signed, faxed to [facility].

## 2016-05-01 NOTE — Telephone Encounter (Signed)
2 bottles of Contrast given to patient per 05/01/16 los. Avs report and appointment schedule given to patient, per 05/01/16 los.

## 2016-05-03 ENCOUNTER — Telehealth: Payer: Self-pay | Admitting: *Deleted

## 2016-05-03 ENCOUNTER — Encounter: Payer: Self-pay | Admitting: *Deleted

## 2016-05-03 DIAGNOSIS — C3411 Malignant neoplasm of upper lobe, right bronchus or lung: Secondary | ICD-10-CM

## 2016-05-03 NOTE — Telephone Encounter (Signed)
Oncology Nurse Navigator Documentation  Oncology Nurse Navigator Flowsheets 05/03/2016  Navigator Encounter Type Other/patient and partner, [name] would like palliative care consult.  I called palliative care and left a message to call with referral. I updated Dr. Julien Nordmann   Treatment Phase Treatment  Barriers/Navigation Needs Coordination of Care  Interventions Coordination of Care  Coordination of Care Other  Acuity Level 2  Acuity Level 2 Referrals such as genetics, survivorship  Time Spent with Patient 30

## 2016-05-06 ENCOUNTER — Telehealth: Payer: Self-pay

## 2016-05-06 ENCOUNTER — Telehealth: Payer: Self-pay | Admitting: Internal Medicine

## 2016-05-06 NOTE — Telephone Encounter (Signed)
Mailed disability paperwork to [employer], left copy for pt at front desk

## 2016-05-06 NOTE — Telephone Encounter (Signed)
Spoke with bonnie confiming next appointment for 10/4. Patient to get new schedule 10/4.

## 2016-05-08 ENCOUNTER — Ambulatory Visit (HOSPITAL_BASED_OUTPATIENT_CLINIC_OR_DEPARTMENT_OTHER): Payer: BC Managed Care – PPO

## 2016-05-08 ENCOUNTER — Ambulatory Visit (HOSPITAL_COMMUNITY)
Admission: RE | Admit: 2016-05-08 | Discharge: 2016-05-08 | Disposition: A | Discharge status: Home / Self Care | Payer: BC Managed Care – PPO | Source: Ambulatory Visit | Attending: Internal Medicine | Admitting: Internal Medicine

## 2016-05-08 ENCOUNTER — Other Ambulatory Visit: Payer: Self-pay | Admitting: Internal Medicine

## 2016-05-08 ENCOUNTER — Other Ambulatory Visit (HOSPITAL_BASED_OUTPATIENT_CLINIC_OR_DEPARTMENT_OTHER): Payer: BC Managed Care – PPO

## 2016-05-08 ENCOUNTER — Other Ambulatory Visit: Payer: Self-pay | Admitting: Medical Oncology

## 2016-05-08 ENCOUNTER — Other Ambulatory Visit: Payer: Self-pay | Admitting: *Deleted

## 2016-05-08 DIAGNOSIS — D649 Anemia, unspecified: Secondary | ICD-10-CM

## 2016-05-08 DIAGNOSIS — C3411 Malignant neoplasm of upper lobe, right bronchus or lung: Secondary | ICD-10-CM | POA: Diagnosis not present

## 2016-05-08 DIAGNOSIS — Z5111 Encounter for antineoplastic chemotherapy: Secondary | ICD-10-CM

## 2016-05-08 DIAGNOSIS — C7951 Secondary malignant neoplasm of bone: Secondary | ICD-10-CM | POA: Diagnosis not present

## 2016-05-08 LAB — CBC WITH DIFFERENTIAL/PLATELET
BASO%: 0.2 % (ref 0.0–2.0)
BASOS ABS: 0 10*3/uL (ref 0.0–0.1)
EOS ABS: 0 10*3/uL (ref 0.0–0.5)
EOS%: 0.6 % (ref 0.0–7.0)
HCT: 21.8 % — ABNORMAL LOW (ref 34.8–46.6)
HGB: 7.2 g/dL — ABNORMAL LOW (ref 11.6–15.9)
LYMPH%: 4.9 % — AB (ref 14.0–49.7)
MCH: 31.4 pg (ref 25.1–34.0)
MCHC: 33 g/dL (ref 31.5–36.0)
MCV: 95.2 fL (ref 79.5–101.0)
MONO#: 0.5 10*3/uL (ref 0.1–0.9)
MONO%: 10.7 % (ref 0.0–14.0)
NEUT#: 3.9 10*3/uL (ref 1.5–6.5)
NEUT%: 83.6 % — AB (ref 38.4–76.8)
PLATELETS: 52 10*3/uL — AB (ref 145–400)
RBC: 2.29 10*6/uL — AB (ref 3.70–5.45)
RDW: 21 % — ABNORMAL HIGH (ref 11.2–14.5)
WBC: 4.7 10*3/uL (ref 3.9–10.3)
lymph#: 0.2 10*3/uL — ABNORMAL LOW (ref 0.9–3.3)

## 2016-05-08 LAB — COMPREHENSIVE METABOLIC PANEL
ALT: 24 U/L (ref 0–55)
ANION GAP: 12 meq/L — AB (ref 3–11)
AST: 24 U/L (ref 5–34)
Albumin: 2.6 g/dL — ABNORMAL LOW (ref 3.5–5.0)
Alkaline Phosphatase: 74 U/L (ref 40–150)
BILIRUBIN TOTAL: 0.43 mg/dL (ref 0.20–1.20)
BUN: 26.7 mg/dL — ABNORMAL HIGH (ref 7.0–26.0)
CHLORIDE: 106 meq/L (ref 98–109)
CO2: 21 meq/L — AB (ref 22–29)
Calcium: 9.2 mg/dL (ref 8.4–10.4)
Creatinine: 0.8 mg/dL (ref 0.6–1.1)
EGFR: 80 mL/min/{1.73_m2} — AB (ref >90)
Glucose: 174 mg/dl — ABNORMAL HIGH (ref 70–140)
Potassium: 4.1 mEq/L (ref 3.5–5.1)
Sodium: 139 mEq/L (ref 136–145)
TOTAL PROTEIN: 7 g/dL (ref 6.4–8.3)

## 2016-05-08 LAB — PREPARE RBC (CROSSMATCH)

## 2016-05-08 MED ORDER — ACETAMINOPHEN 325 MG PO TABS
ORAL_TABLET | ORAL | Status: AC
  Filled 2016-05-08: qty 2

## 2016-05-08 MED ORDER — HEPARIN SOD (PORK) LOCK FLUSH 100 UNIT/ML IV SOLN
500.0000 [IU] | Freq: Every day | INTRAVENOUS | Status: DC | PRN
  Filled 2016-05-08: qty 5

## 2016-05-08 MED ORDER — DIPHENHYDRAMINE HCL 25 MG PO CAPS
25.0000 mg | ORAL_CAPSULE | Freq: Once | ORAL | Status: AC
  Administered 2016-05-08: 25 mg via ORAL

## 2016-05-08 MED ORDER — SODIUM CHLORIDE 0.9% FLUSH
3.0000 mL | INTRAVENOUS | Status: DC | PRN
  Filled 2016-05-08: qty 10

## 2016-05-08 MED ORDER — ACETAMINOPHEN 325 MG PO TABS
650.0000 mg | ORAL_TABLET | Freq: Once | ORAL | Status: AC
  Administered 2016-05-08: 650 mg via ORAL

## 2016-05-08 MED ORDER — SODIUM CHLORIDE 0.9 % IV SOLN
250.0000 mL | Freq: Once | INTRAVENOUS | Status: AC
  Administered 2016-05-08: 250 mL via INTRAVENOUS

## 2016-05-08 MED ORDER — DIPHENHYDRAMINE HCL 25 MG PO CAPS
ORAL_CAPSULE | ORAL | Status: AC
  Filled 2016-05-08: qty 1

## 2016-05-08 NOTE — Progress Notes (Signed)
Writer called Dr. Julien Nordmann with current lab values. No chemo today but will infuse 2 unit of blood.

## 2016-05-08 NOTE — Patient Instructions (Signed)

## 2016-05-08 NOTE — Progress Notes (Signed)
Reviewed labs with Dr. Julien Nordmann ( Hemoglobin 7.2, platelets 52) per Dr. Julien Nordmann hold treatment today and pt to receive 2 units of PRBCs.

## 2016-05-09 LAB — TYPE AND SCREEN
ABO/RH(D): B NEG
Antibody Screen: NEGATIVE
Unit division: 0
Unit division: 0

## 2016-05-10 ENCOUNTER — Ambulatory Visit: Payer: BC Managed Care – PPO

## 2016-05-10 ENCOUNTER — Telehealth: Payer: Self-pay

## 2016-05-10 NOTE — Telephone Encounter (Signed)
Laura Walter with home health and hospice in [facility] called for orders for pt to receive private duty aid for 4 hours per day for 4 days next week while pt is visiting a friend there. VO given. She will be discharged when returns to [facility].

## 2016-05-11 ENCOUNTER — Emergency Department (HOSPITAL_COMMUNITY): Payer: BC Managed Care – PPO

## 2016-05-11 ENCOUNTER — Other Ambulatory Visit: Payer: Self-pay

## 2016-05-11 ENCOUNTER — Inpatient Hospital Stay (HOSPITAL_COMMUNITY)
Admission: EM | Admit: 2016-05-11 | Discharge: 2016-05-16 | DRG: 054 | Disposition: A | Discharge status: Home Health | Payer: BC Managed Care – PPO | Attending: Internal Medicine | Admitting: Internal Medicine

## 2016-05-11 ENCOUNTER — Encounter (HOSPITAL_COMMUNITY): Payer: Self-pay | Admitting: Emergency Medicine

## 2016-05-11 DIAGNOSIS — K5900 Constipation, unspecified: Secondary | ICD-10-CM | POA: Diagnosis present

## 2016-05-11 DIAGNOSIS — Z8701 Personal history of pneumonia (recurrent): Secondary | ICD-10-CM

## 2016-05-11 DIAGNOSIS — Z79899 Other long term (current) drug therapy: Secondary | ICD-10-CM

## 2016-05-11 DIAGNOSIS — R531 Weakness: Secondary | ICD-10-CM

## 2016-05-11 DIAGNOSIS — C7931 Secondary malignant neoplasm of brain: Secondary | ICD-10-CM | POA: Diagnosis not present

## 2016-05-11 DIAGNOSIS — Z85118 Personal history of other malignant neoplasm of bronchus and lung: Secondary | ICD-10-CM

## 2016-05-11 DIAGNOSIS — E860 Dehydration: Secondary | ICD-10-CM

## 2016-05-11 DIAGNOSIS — Z66 Do not resuscitate: Secondary | ICD-10-CM | POA: Diagnosis not present

## 2016-05-11 DIAGNOSIS — E43 Unspecified severe protein-calorie malnutrition: Secondary | ICD-10-CM | POA: Insufficient documentation

## 2016-05-11 DIAGNOSIS — Z923 Personal history of irradiation: Secondary | ICD-10-CM

## 2016-05-11 DIAGNOSIS — C3490 Malignant neoplasm of unspecified part of unspecified bronchus or lung: Secondary | ICD-10-CM

## 2016-05-11 DIAGNOSIS — Z800 Family history of malignant neoplasm of digestive organs: Secondary | ICD-10-CM

## 2016-05-11 DIAGNOSIS — R627 Adult failure to thrive: Secondary | ICD-10-CM | POA: Diagnosis present

## 2016-05-11 DIAGNOSIS — R112 Nausea with vomiting, unspecified: Secondary | ICD-10-CM | POA: Diagnosis present

## 2016-05-11 DIAGNOSIS — Z931 Gastrostomy status: Secondary | ICD-10-CM

## 2016-05-11 DIAGNOSIS — Z8249 Family history of ischemic heart disease and other diseases of the circulatory system: Secondary | ICD-10-CM

## 2016-05-11 DIAGNOSIS — Z833 Family history of diabetes mellitus: Secondary | ICD-10-CM

## 2016-05-11 DIAGNOSIS — Z6821 Body mass index (BMI) 21.0-21.9, adult: Secondary | ICD-10-CM

## 2016-05-11 DIAGNOSIS — C7951 Secondary malignant neoplasm of bone: Secondary | ICD-10-CM | POA: Diagnosis present

## 2016-05-11 DIAGNOSIS — R1110 Vomiting, unspecified: Secondary | ICD-10-CM

## 2016-05-11 DIAGNOSIS — D61818 Other pancytopenia: Secondary | ICD-10-CM | POA: Diagnosis present

## 2016-05-11 DIAGNOSIS — C3411 Malignant neoplasm of upper lobe, right bronchus or lung: Secondary | ICD-10-CM | POA: Diagnosis present

## 2016-05-11 DIAGNOSIS — Z882 Allergy status to sulfonamides status: Secondary | ICD-10-CM

## 2016-05-11 DIAGNOSIS — C799 Secondary malignant neoplasm of unspecified site: Secondary | ICD-10-CM

## 2016-05-11 LAB — URINE MICROSCOPIC-ADD ON
BACTERIA UA: NONE SEEN
SQUAMOUS EPITHELIAL / LPF: NONE SEEN

## 2016-05-11 LAB — I-STAT CG4 LACTIC ACID, ED
Lactic Acid, Venous: 1.34 mmol/L (ref 0.5–1.9)
Lactic Acid, Venous: 0.69 mmol/L (ref 0.5–1.9)

## 2016-05-11 LAB — COMPREHENSIVE METABOLIC PANEL
ALBUMIN: 2.7 g/dL — AB (ref 3.5–5.0)
ALT: 35 U/L (ref 14–54)
AST: 33 U/L (ref 15–41)
Alkaline Phosphatase: 67 U/L (ref 38–126)
Anion gap: 8 (ref 5–15)
BUN: 27 mg/dL — AB (ref 6–20)
CALCIUM: 8.1 mg/dL — AB (ref 8.9–10.3)
CHLORIDE: 107 mmol/L (ref 101–111)
CO2: 23 mmol/L (ref 22–32)
Creatinine, Ser: 0.77 mg/dL (ref 0.44–1.00)
GFR calc non Af Amer: >60 mL/min (ref >60)
Glucose, Bld: 135 mg/dL — ABNORMAL HIGH (ref 65–99)
Potassium: 3.5 mmol/L (ref 3.5–5.1)
SODIUM: 138 mmol/L (ref 135–145)
TOTAL PROTEIN: 6.5 g/dL (ref 6.5–8.1)
Total Bilirubin: 0.5 mg/dL (ref 0.3–1.2)

## 2016-05-11 LAB — URINALYSIS, ROUTINE W REFLEX MICROSCOPIC
BILIRUBIN URINE: NEGATIVE
Glucose, UA: NEGATIVE mg/dL
KETONES UR: NEGATIVE mg/dL
LEUKOCYTES UA: NEGATIVE
NITRITE: NEGATIVE
PH: 6 (ref 5.0–8.0)
PROTEIN: NEGATIVE mg/dL
Specific Gravity, Urine: 1.021 (ref 1.005–1.030)

## 2016-05-11 LAB — CBC WITH DIFFERENTIAL/PLATELET
BASOS ABS: 0 10*3/uL (ref 0.0–0.1)
Basophils Relative: 0 %
EOS ABS: 0.1 10*3/uL (ref 0.0–0.7)
Eosinophils Relative: 1 %
HEMATOCRIT: 29.2 % — AB (ref 36.0–46.0)
Hemoglobin: 9.8 g/dL — ABNORMAL LOW (ref 12.0–15.0)
LYMPHS ABS: 0.2 10*3/uL — AB (ref 0.7–4.0)
LYMPHS PCT: 3 %
MCH: 31.2 pg (ref 26.0–34.0)
MCHC: 33.6 g/dL (ref 30.0–36.0)
MCV: 93 fL (ref 78.0–100.0)
MONOS PCT: 13 %
Monocytes Absolute: 0.7 10*3/uL (ref 0.1–1.0)
Neutro Abs: 4.1 10*3/uL (ref 1.7–7.7)
Neutrophils Relative %: 83 %
Platelets: 50 10*3/uL — ABNORMAL LOW (ref 150–400)
RBC: 3.14 MIL/uL — AB (ref 3.87–5.11)
RDW: 18.4 % — AB (ref 11.5–15.5)
WBC: 5.1 10*3/uL (ref 4.0–10.5)

## 2016-05-11 MED ORDER — SODIUM CHLORIDE 0.9 % IV BOLUS (SEPSIS)
1000.0000 mL | Freq: Once | INTRAVENOUS | Status: AC
  Administered 2016-05-11: 1000 mL via INTRAVENOUS

## 2016-05-11 MED ORDER — FE FUMARATE-B12-VIT C-FA-IFC PO CAPS
1.0000 | ORAL_CAPSULE | Freq: Every day | ORAL | Status: DC
  Filled 2016-05-11: qty 1

## 2016-05-11 MED ORDER — IOPAMIDOL (ISOVUE-300) INJECTION 61%
100.0000 mL | Freq: Once | INTRAVENOUS | Status: AC | PRN
  Administered 2016-05-11: 100 mL via INTRAVENOUS

## 2016-05-11 MED ORDER — SODIUM CHLORIDE 0.9 % IV SOLN
INTRAVENOUS | Status: DC
  Administered 2016-05-11 – 2016-05-14 (×6): via INTRAVENOUS
  Administered 2016-05-15: 1000 mL via INTRAVENOUS

## 2016-05-11 MED ORDER — OLANZAPINE 5 MG PO TABS
10.0000 mg | ORAL_TABLET | Freq: Every day | ORAL | Status: DC
  Administered 2016-05-11 – 2016-05-14 (×4): 10 mg via ORAL
  Administered 2016-05-15: 5 mg via ORAL
  Filled 2016-05-11 (×5): qty 2

## 2016-05-11 MED ORDER — FOLIC ACID 1 MG PO TABS
1.0000 mg | ORAL_TABLET | Freq: Every day | ORAL | Status: DC
  Administered 2016-05-12 – 2016-05-16 (×5): 1 mg via ORAL
  Filled 2016-05-11 (×5): qty 1

## 2016-05-11 MED ORDER — FE FUMARATE-B12-VIT C-FA-IFC PO CAPS
1.0000 | ORAL_CAPSULE | Freq: Every day | ORAL | Status: DC
  Administered 2016-05-12 – 2016-05-16 (×5): 1 via ORAL
  Filled 2016-05-11 (×5): qty 1

## 2016-05-11 MED ORDER — OSMOLITE 1.5 CAL PO LIQD
237.0000 mL | Freq: Three times a day (TID) | ORAL | Status: DC
  Administered 2016-05-12 – 2016-05-16 (×12): 237 mL
  Filled 2016-05-11 (×16): qty 237

## 2016-05-11 MED ORDER — LEVETIRACETAM 750 MG PO TABS
750.0000 mg | ORAL_TABLET | Freq: Every day | ORAL | Status: DC
  Administered 2016-05-12 – 2016-05-15 (×4): 750 mg via ORAL
  Filled 2016-05-11 (×5): qty 1

## 2016-05-11 MED ORDER — LEVETIRACETAM 500 MG PO TABS
500.0000 mg | ORAL_TABLET | Freq: Every day | ORAL | Status: DC
  Administered 2016-05-12 – 2016-05-16 (×5): 500 mg via ORAL
  Filled 2016-05-11 (×5): qty 1

## 2016-05-11 MED ORDER — HYDROCODONE-ACETAMINOPHEN 5-325 MG PO TABS: 1.0000 | ORAL_TABLET | ORAL | Status: DC | PRN

## 2016-05-11 MED ORDER — TRAMADOL HCL 50 MG PO TABS: 50.0000 mg | ORAL_TABLET | Freq: Four times a day (QID) | ORAL | Status: DC | PRN

## 2016-05-11 MED ORDER — INTEGRA PLUS PO CAPS: 1.0000 | ORAL_CAPSULE | Freq: Every morning | ORAL | Status: DC

## 2016-05-11 MED ORDER — IOPAMIDOL (ISOVUE-300) INJECTION 61%
15.0000 mL | Freq: Once | INTRAVENOUS | Status: AC | PRN
  Administered 2016-05-11: 30 mL via ORAL

## 2016-05-11 MED ORDER — LEVETIRACETAM 500 MG PO TABS: 500.0000 mg | ORAL_TABLET | Freq: Two times a day (BID) | ORAL | Status: DC

## 2016-05-11 NOTE — Care Management Note (Signed)
Case Management Note  Patient Details  Name: Laura Walter MRN: [id number] Date of Birth: [birth year]  Subjective/Objective:                    Action/Plan:   Expected Discharge Date:                  Expected Discharge Plan:  Home/Self Care  In-House Referral:  NA  Discharge planning Services  CM Consult  Post Acute Care Choice:  Home Health Choice offered to:  Patient (Partner Teodoro Spray with the verbal permission of pt)  DME Arranged:  Hospital bed (Has RW, BSC, W/C) DME Agency:  [place]:  RN, PT, Nurse's Aide [place] Agency:  Other - See comment ([facility])  Status of Service:  Completed, signed off  If discussed at [place] of Stay Meetings, dates discussed:    Additional Comments:  Delrae Sawyers, RN 05/11/2016, 4:27 PM

## 2016-05-11 NOTE — ED Provider Notes (Signed)
[place] of generalized weakness. She reports that for the past 2 days she's been too weak to stand. She has home health supplies being delivered to her home tomorrow including hospital bed.Dr Alcario Drought consulted to evalaute pt in ED for overnight stay Results for orders placed or performed during the hospital encounter of 05/11/16  Comprehensive metabolic panel  Result Value Ref Range   Sodium 138 135 - 145 mmol/L   Potassium 3.5 3.5 - 5.1 mmol/L   Chloride 107 101 - 111 mmol/L   CO2 23 22 - 32 mmol/L   Glucose, Bld 135 (H) 65 - 99 mg/dL   BUN 27 (H) 6 - 20 mg/dL   Creatinine, Ser 0.77 0.44 - 1.00 mg/dL   Calcium 8.1 (L) 8.9 - 10.3 mg/dL   Total Protein 6.5 6.5 - 8.1 g/dL   Albumin 2.7 (L) 3.5 - 5.0 g/dL   AST 33 15 - 41 U/L   ALT 35 14 - 54 U/L   Alkaline Phosphatase 67 38 - 126 U/L   Total Bilirubin 0.5 0.3 - 1.2 mg/dL   GFR calc non Af Amer >60 >60 mL/min   GFR calc Af Amer >60 >60 mL/min   Anion gap 8 5 - 15  CBC WITH DIFFERENTIAL  Result Value Ref Range   WBC 5.1 4.0 - 10.5 K/uL   RBC 3.14 (L) 3.87 - 5.11 MIL/uL   Hemoglobin 9.8 (L) 12.0 - 15.0 g/dL   HCT 29.2 (L) 36.0 - 46.0 %   MCV 93.0 78.0 - 100.0 fL   MCH 31.2 26.0 - 34.0 pg   MCHC 33.6 30.0 - 36.0 g/dL   RDW 18.4 (H) 11.5 - 15.5 %   Platelets 50 (L) 150 - 400 K/uL   Neutrophils Relative % 83 %   Lymphocytes Relative 3 %   Monocytes Relative 13 %   Eosinophils Relative 1 %   Basophils Relative 0 %   Neutro Abs 4.1 1.7 - 7.7 K/uL   Lymphs Abs 0.2 (L) 0.7 - 4.0 K/uL   Monocytes Absolute 0.7 0.1 - 1.0 K/uL   Eosinophils Absolute 0.1 0.0 - 0.7 K/uL   Basophils Absolute 0.0 0.0 - 0.1 K/uL   Smear Review MORPHOLOGY UNREMARKABLE   Urinalysis, Routine w reflex microscopic (not at [facility])  Result Value Ref Range   Color, Urine YELLOW YELLOW   APPearance CLEAR CLEAR   Specific Gravity, Urine 1.021 1.005 - 1.030   pH 6.0 5.0 - 8.0   Glucose, UA NEGATIVE NEGATIVE mg/dL   Hgb urine dipstick MODERATE (A) NEGATIVE   Bilirubin  Urine NEGATIVE NEGATIVE   Ketones, ur NEGATIVE NEGATIVE mg/dL   Protein, ur NEGATIVE NEGATIVE mg/dL   Nitrite NEGATIVE NEGATIVE   Leukocytes, UA NEGATIVE NEGATIVE  Urine microscopic-add on  Result Value Ref Range   Squamous Epithelial / LPF NONE SEEN NONE SEEN   WBC, UA 0-5 0 - 5 WBC/hpf   RBC / HPF 6-30 0 - 5 RBC/hpf   Bacteria, UA NONE SEEN NONE SEEN  I-Stat CG4 Lactic Acid, ED  (not at  [facility])  Result Value Ref Range   Lactic Acid, Venous 1.34 0.5 - 1.9 mmol/L  I-Stat CG4 Lactic Acid, ED  (not at  [facility])  Result Value Ref Range   Lactic Acid, Venous 0.69 0.5 - 1.9 mmol/L   Ct Head W & Wo Contrast  Result Date: 05/11/2016 CLINICAL DATA:  Follow-up lung cancer. EXAM: CT HEAD WITHOUT AND WITH CONTRAST CT CHEST, ABDOMEN AND PELVIS WITH CONTRAST TECHNIQUE:  Contiguous axial images were obtained from the base of the skull through the vertex without and with intravenous contrast. Multidetector CT imaging of the chest, abdomen and pelvis was performed following the standard protocol during bolus administration of intravenous contrast. CONTRAST:  162m ISOVUE-300 IOPAMIDOL (ISOVUE-300) INJECTION 61%, 361mISOVUE-300 IOPAMIDOL (ISOVUE-300) INJECTION 61%<Contrast>10035mSOVUE-300 IOPAMIDOL (ISOVUE-300) INJECTION 61%, 63m31mOVUE-300 IOPAMIDOL (ISOVUE-300) INJECTION 61% COMPARISON:  CT scan of the brain March 09, 2016, MRI brain March 10, 2016, and CT of the chest/abdomen/ pelvis January 15, 2016 FINDINGS: CT HEAD FINDINGS Brain: No subdural, epidural, or subarachnoid. High attenuation in the right basal ganglia on series 2, image 12 is consistent with calcification. No intraparenchymal hemorrhage is identified. White matter changes are stable and severe. No acute cortical ischemia or infarct. The cerebellum and brainstem are normal. The basal cisterns are unremarkable. No midline shift. No enhancing metastatic disease identified. Vascular: No hyperdense vessel or unexpected calcification. Skull: Normal.  Negative for fracture or focal lesion. Sinuses/Orbits: No acute finding. Other: No other abnormalities. CT CHEST FINDINGS Cardiovascular: No significant vascular findings. Normal heart size. Mediastinum/Nodes: No adenopathy is seen within the chest. The thyroid is normal in appearance. The thoracic aorta is non aneurysmal. A small amount of pericardial fluid is stable. The heart size is within normal limits. Mild coronary artery calcifications are noted. The central great vessels otherwise normal. Lungs/Pleura: The central airways are normal. No pneumothorax. The timing of the contrast is suboptimal limiting evaluation of the patient's known lung malignancies. The 2 separate lesions in the right upper chest on the previous study are difficult to differentiate on today's study. Additionally, there is increased infiltrate surrounding the known malignancy. The subpleural nodule seen on the previous study is difficult to measure due to timing of contrast. However, it appears to be similar in size measuring 2.0 x 2.7 cm today versus 2.3 x 2.7 cm previously. The larger slightly more inferior mass is also difficult to measure but measures approximately 3.0 by 2.8 cm today versus 3.5 x 3.2 cm previously. There is increased infiltrate surrounding this mass and extending towards the suprahilar region on the right. By report, the patient's last radiation therapy to the lung was April 2017. The infiltrate in the medial right lower lobe is likely unchanged but obscured by an enlarging right-sided pleural effusion and atelectasis. The right-sided pleural effusion is larger in the interval. There is a small left effusion now present as well. There is interlobular septal thickening in the right upper lobe which is mild but new. No other changes on the right. There is atelectasis in the left base. No suspicious nodules or masses are seen in the left lung. Musculoskeletal: The mixed lytic and sclerotic lesion with pathologic fracture  in T9 is stable. A sclerotic focus the anterior aspect of C7 is stable. A sclerotic focus near the superior endplate of T11 F62stable. Sclerosis in the manubrium is unchanged. Sclerosis in the anterior eleventh seventh rib is stable. Sclerosis in the posterior left fifth rib is stable. No entirely new bony metastatic lesions are seen within the chest. CT ABDOMEN AND PELVIS FINDINGS Hepatobiliary: Evaluation of the parenchymal organs is limited due to timing of contrast. There is a cyst in the left hepatic lobe. Another seen in the right hepatic lobe. No liver metastases are identified. The gallbladder and portal vein are unremarkable. Pancreas: No mass, inflammatory changes, or other significant abnormality. Spleen: Within normal limits in size and appearance. Adrenals/Urinary Tract: The adrenal glands are normal in appearance. No  suspicious renal masses are seen. There is a nodule however posterior to the left kidney on series 2, image 59 measuring 14 by 12 mm, new in the interval. Stomach/Bowel: A PEG tube is identified. The stomach and small bowel are otherwise normal. The colon and appendix are unremarkable. The Vascular/Lymphatic: The abdominal aorta is non aneurysmal. Mild atherosclerotic change in the aorta. No adenopathy. Reproductive: No mass or other significant abnormality. Musculoskeletal: A tiny sclerotic focus in T12 is stable. Metastatic disease and L2 and L3 with endplate irregularity is stable. No disease and L4. Sclerosis in the lateral aspect of L5 is stable. 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 No new bony metastatic disease seen in the abdomen or pelvis today. Other: There is a small amount of free fluid in the pelvis which is stable. No additional suspicious abnormalities. IMPRESSION: 1. No intracranial abnormalities identified. No metastatic disease. MRI imaging would be more sensitive and specific for metastatic disease in the brain. 2. Evaluation of the lesions  in the chest is limited due to poor timing of contrast. The subpleural nodule in the right upper lobe is similar to slightly smaller in the interval. The larger more inferior nodule in the right upper lobe is a little smaller in the interval. Increased opacity adjacent to these right upper lobe masses, extending centrally towards the right hilum is nonspecific and could represent an infectious or inflammatory process. Evolving postradiation change or worsening malignancy are also possibilities. Recommend attention on follow-up. Interlobular septal thickening in the right upper lobe is mild but new and lymphangitic spread of tumor is not excluded. 3. Bony metastatic disease is essentially stable in the interval. 4. There is a new soft tissue nodule posterior to the left kidney consistent with metastatic disease. This is new in the interval. Electronically Signed   By: Dorise Bullion III M.D   On: 05/11/2016 20:23   Ct Chest W Contrast  Result Date: 05/11/2016 CLINICAL DATA:  Follow-up lung cancer. EXAM: CT HEAD WITHOUT AND WITH CONTRAST CT CHEST, ABDOMEN AND PELVIS WITH CONTRAST TECHNIQUE: Contiguous axial images were obtained from the base of the skull through the vertex without and with intravenous contrast. Multidetector CT imaging of the chest, abdomen and pelvis was performed following the standard protocol during bolus administration of intravenous contrast. CONTRAST:  112m ISOVUE-300 IOPAMIDOL (ISOVUE-300) INJECTION 61%, 39mISOVUE-300 IOPAMIDOL (ISOVUE-300) INJECTION 61%<Contrast>10033mSOVUE-300 IOPAMIDOL (ISOVUE-300) INJECTION 61%, 65m47mOVUE-300 IOPAMIDOL (ISOVUE-300) INJECTION 61% COMPARISON:  CT scan of the brain March 09, 2016, MRI brain March 10, 2016, and CT of the chest/abdomen/ pelvis January 15, 2016 FINDINGS: CT HEAD FINDINGS Brain: No subdural, epidural, or subarachnoid. High attenuation in the right basal ganglia on series 2, image 12 is consistent with calcification. No intraparenchymal  hemorrhage is identified. White matter changes are stable and severe. No acute cortical ischemia or infarct. The cerebellum and brainstem are normal. The basal cisterns are unremarkable. No midline shift. No enhancing metastatic disease identified. Vascular: No hyperdense vessel or unexpected calcification. Skull: Normal. Negative for fracture or focal lesion. Sinuses/Orbits: No acute finding. Other: No other abnormalities. CT CHEST FINDINGS Cardiovascular: No significant vascular findings. Normal heart size. Mediastinum/Nodes: No adenopathy is seen within the chest. The thyroid is normal in appearance. The thoracic aorta is non aneurysmal. A small amount of pericardial fluid is stable. The heart size is within normal limits. Mild coronary artery calcifications are noted. The central great vessels otherwise normal. Lungs/Pleura: The central airways are normal. No pneumothorax. The timing of  the contrast is suboptimal limiting evaluation of the patient's known lung malignancies. The 2 separate lesions in the right upper chest on the previous study are difficult to differentiate on today's study. Additionally, there is increased infiltrate surrounding the known malignancy. The subpleural nodule seen on the previous study is difficult to measure due to timing of contrast. However, it appears to be similar in size measuring 2.0 x 2.7 cm today versus 2.3 x 2.7 cm previously. The larger slightly more inferior mass is also difficult to measure but measures approximately 3.0 by 2.8 cm today versus 3.5 x 3.2 cm previously. There is increased infiltrate surrounding this mass and extending towards the suprahilar region on the right. By report, the patient's last radiation therapy to the lung was April 2017. The infiltrate in the medial right lower lobe is likely unchanged but obscured by an enlarging right-sided pleural effusion and atelectasis. The right-sided pleural effusion is larger in the interval. There is a small left  effusion now present as well. There is interlobular septal thickening in the right upper lobe which is mild but new. No other changes on the right. There is atelectasis in the left base. No suspicious nodules or masses are seen in the left lung. Musculoskeletal: The mixed lytic and sclerotic lesion with pathologic fracture in T9 is stable. A sclerotic focus the anterior aspect of C7 is stable. A sclerotic focus near the superior endplate of J67 is stable. Sclerosis in the manubrium is unchanged. Sclerosis in the anterior eleventh seventh rib is stable. Sclerosis in the posterior left fifth rib is stable. No entirely new bony metastatic lesions are seen within the chest. CT ABDOMEN AND PELVIS FINDINGS Hepatobiliary: Evaluation of the parenchymal organs is limited due to timing of contrast. There is a cyst in the left hepatic lobe. Another seen in the right hepatic lobe. No liver metastases are identified. The gallbladder and portal vein are unremarkable. Pancreas: No mass, inflammatory changes, or other significant abnormality. Spleen: Within normal limits in size and appearance. Adrenals/Urinary Tract: The adrenal glands are normal in appearance. No suspicious renal masses are seen. There is a nodule however posterior to the left kidney on series 2, image 59 measuring 14 by 12 mm, new in the interval. Stomach/Bowel: A PEG tube is identified. The stomach and small bowel are otherwise normal. The colon and appendix are unremarkable. The Vascular/Lymphatic: The abdominal aorta is non aneurysmal. Mild atherosclerotic change in the aorta. No adenopathy. Reproductive: No mass or other significant abnormality. Musculoskeletal: A tiny sclerotic focus in T12 is stable. Metastatic disease and L2 and L3 with endplate irregularity is stable. No disease and L4. Sclerosis in the lateral aspect of L5 is stable. 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 No new bony metastatic disease seen in the abdomen or  pelvis today. Other: There is a small amount of free fluid in the pelvis which is stable. No additional suspicious abnormalities. IMPRESSION: 1. No intracranial abnormalities identified. No metastatic disease. MRI imaging would be more sensitive and specific for metastatic disease in the brain. 2. Evaluation of the lesions in the chest is limited due to poor timing of contrast. The subpleural nodule in the right upper lobe is similar to slightly smaller in the interval. The larger more inferior nodule in the right upper lobe is a little smaller in the interval. Increased opacity adjacent to these right upper lobe masses, extending centrally towards the right hilum is nonspecific and could represent an infectious or inflammatory process. Evolving postradiation  change or worsening malignancy are also possibilities. Recommend attention on follow-up. Interlobular septal thickening in the right upper lobe is mild but new and lymphangitic spread of tumor is not excluded. 3. Bony metastatic disease is essentially stable in the interval. 4. There is a new soft tissue nodule posterior to the left kidney consistent with metastatic disease. This is new in the interval. Electronically Signed   By: Dorise Bullion III M.D   On: 05/11/2016 20:23   Ct Abdomen Pelvis W Contrast  Result Date: 05/11/2016 CLINICAL DATA:  Follow-up lung cancer. EXAM: CT HEAD WITHOUT AND WITH CONTRAST CT CHEST, ABDOMEN AND PELVIS WITH CONTRAST TECHNIQUE: Contiguous axial images were obtained from the base of the skull through the vertex without and with intravenous contrast. Multidetector CT imaging of the chest, abdomen and pelvis was performed following the standard protocol during bolus administration of intravenous contrast. CONTRAST:  135m ISOVUE-300 IOPAMIDOL (ISOVUE-300) INJECTION 61%, 338mISOVUE-300 IOPAMIDOL (ISOVUE-300) INJECTION 61%<Contrast>10062mSOVUE-300 IOPAMIDOL (ISOVUE-300) INJECTION 61%, 23m51mOVUE-300 IOPAMIDOL (ISOVUE-300)  INJECTION 61% COMPARISON:  CT scan of the brain March 09, 2016, MRI brain March 10, 2016, and CT of the chest/abdomen/ pelvis January 15, 2016 FINDINGS: CT HEAD FINDINGS Brain: No subdural, epidural, or subarachnoid. High attenuation in the right basal ganglia on series 2, image 12 is consistent with calcification. No intraparenchymal hemorrhage is identified. White matter changes are stable and severe. No acute cortical ischemia or infarct. The cerebellum and brainstem are normal. The basal cisterns are unremarkable. No midline shift. No enhancing metastatic disease identified. Vascular: No hyperdense vessel or unexpected calcification. Skull: Normal. Negative for fracture or focal lesion. Sinuses/Orbits: No acute finding. Other: No other abnormalities. CT CHEST FINDINGS Cardiovascular: No significant vascular findings. Normal heart size. Mediastinum/Nodes: No adenopathy is seen within the chest. The thyroid is normal in appearance. The thoracic aorta is non aneurysmal. A small amount of pericardial fluid is stable. The heart size is within normal limits. Mild coronary artery calcifications are noted. The central great vessels otherwise normal. Lungs/Pleura: The central airways are normal. No pneumothorax. The timing of the contrast is suboptimal limiting evaluation of the patient's known lung malignancies. The 2 separate lesions in the right upper chest on the previous study are difficult to differentiate on today's study. Additionally, there is increased infiltrate surrounding the known malignancy. The subpleural nodule seen on the previous study is difficult to measure due to timing of contrast. However, it appears to be similar in size measuring 2.0 x 2.7 cm today versus 2.3 x 2.7 cm previously. The larger slightly more inferior mass is also difficult to measure but measures approximately 3.0 by 2.8 cm today versus 3.5 x 3.2 cm previously. There is increased infiltrate surrounding this mass and extending towards  the suprahilar region on the right. By report, the patient's last radiation therapy to the lung was April 2017. The infiltrate in the medial right lower lobe is likely unchanged but obscured by an enlarging right-sided pleural effusion and atelectasis. The right-sided pleural effusion is larger in the interval. There is a small left effusion now present as well. There is interlobular septal thickening in the right upper lobe which is mild but new. No other changes on the right. There is atelectasis in the left base. No suspicious nodules or masses are seen in the left lung. Musculoskeletal: The mixed lytic and sclerotic lesion with pathologic fracture in T9 is stable. A sclerotic focus the anterior aspect of C7 is stable. A sclerotic focus near the superior endplate of  T11 is stable. Sclerosis in the manubrium is unchanged. Sclerosis in the anterior eleventh seventh rib is stable. Sclerosis in the posterior left fifth rib is stable. No entirely new bony metastatic lesions are seen within the chest. CT ABDOMEN AND PELVIS FINDINGS Hepatobiliary: Evaluation of the parenchymal organs is limited due to timing of contrast. There is a cyst in the left hepatic lobe. Another seen in the right hepatic lobe. No liver metastases are identified. The gallbladder and portal vein are unremarkable. Pancreas: No mass, inflammatory changes, or other significant abnormality. Spleen: Within normal limits in size and appearance. Adrenals/Urinary Tract: The adrenal glands are normal in appearance. No suspicious renal masses are seen. There is a nodule however posterior to the left kidney on series 2, image 59 measuring 14 by 12 mm, new in the interval. Stomach/Bowel: A PEG tube is identified. The stomach and small bowel are otherwise normal. The colon and appendix are unremarkable. The Vascular/Lymphatic: The abdominal aorta is non aneurysmal. Mild atherosclerotic change in the aorta. No adenopathy. Reproductive: No mass or other  significant abnormality. Musculoskeletal: A tiny sclerotic focus in T12 is stable. Metastatic disease and L2 and L3 with endplate irregularity is stable. No disease and L4. Sclerosis in the lateral aspect of L5 is stable. 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 No new bony metastatic disease seen in the abdomen or pelvis today. Other: There is a small amount of free fluid in the pelvis which is stable. No additional suspicious abnormalities. IMPRESSION: 1. No intracranial abnormalities identified. No metastatic disease. MRI imaging would be more sensitive and specific for metastatic disease in the brain. 2. Evaluation of the lesions in the chest is limited due to poor timing of contrast. The subpleural nodule in the right upper lobe is similar to slightly smaller in the interval. The larger more inferior nodule in the right upper lobe is a little smaller in the interval. Increased opacity adjacent to these right upper lobe masses, extending centrally towards the right hilum is nonspecific and could represent an infectious or inflammatory process. Evolving postradiation change or worsening malignancy are also possibilities. Recommend attention on follow-up. Interlobular septal thickening in the right upper lobe is mild but new and lymphangitic spread of tumor is not excluded. 3. Bony metastatic disease is essentially stable in the interval. 4. There is a new soft tissue nodule posterior to the left kidney consistent with metastatic disease. This is new in the interval. Electronically Signed   By: Dorise Bullion III M.D   On: 05/11/2016 20:23   Dg Chest Port 1 View  Result Date: 05/11/2016 CLINICAL DATA:  Worsening weakness today. Metastatic lung carcinoma. EXAM: PORTABLE CHEST 1 VIEW COMPARISON:  Chest CT on 01/15/2016 FINDINGS: Persistent mass seen in the lateral right upper lobe, similar in appearance to recent chest CT. No new or increased areas of pulmonary opacity are seen. No  evidence of pneumothorax or pleural effusion. Heart size is normal. IMPRESSION: Persistent mass in lateral right upper lobe, similar in appearance to recent chest CT. No new findings. Electronically Signed   By: Earle Gell M.D.   On: 05/11/2016 12:56  Diagnosis #1 generalized weakness 2 metastatic cancer #3 thrombocytopenia    Orlie Dakin, MD 05/11/16 [id number]

## 2016-05-11 NOTE — Progress Notes (Signed)
Pt tolerated IV placement . All blood obtained. Family in the room.

## 2016-05-11 NOTE — Care Management Note (Signed)
Case Management Note  Patient Details  Name: JASMAN PFEIFLE MRN: [id number] Date of Birth: Apr 01, 1951  Subjective/Objective: 65 y.o. F seen in the [facility] today with progressive weakness. CM received consult for assistance with [facility] needs as pt is in process of receiving "long term care" and Palliative has been consulted.                    Action/Plan:    Expected Discharge Date:                  Expected Discharge Plan:  Assisted Living / Rest Home  In-House Referral:  NA  Discharge planning Services  CM Consult  Post Acute Care Choice:  Home Health Choice offered to:  Patient  DME Arranged:    DME Agency:     HH Arranged:    [place] Agency:     Status of Service:  Completed, signed off  If discussed at [employer] of Stay Meetings, dates discussed:    Additional Comments:  Delrae Sawyers, RN 05/11/2016, 3:47 PM

## 2016-05-11 NOTE — H&P (Signed)
History and Physical    Laura Walter:[id number] DOB: 1950/10/31 DOA: 05/11/2016   PCP: [name], MD Chief Complaint:  Chief Complaint  Patient presents with  . Weakness    HPI: Laura Walter is a 65 y.o. female with medical history significant of metastatic lung cancer with brain met.  Patient presents to ED with progressive generalized weakness.  She has been getting progressively weaker over the past weeks-months but this has especially been severe over the past 1 day where she has become too weak to stand up.  Weakness is in both arms and both legs, no numbness, tingling, pain.  No SOB, no vomiting, no fever, has chronic cough that is unchanged.  No recent seizures.  ED Course: whole body CT scan unremarkable other than a tiny new nodule behind left kidney, some non-specific haziness next to a lung lesion.  UA negative, no SIRS.  BMP unremarkable.  CBC shows improvement of anemia post transfusion.  Review of Systems: As per HPI otherwise 10 point review of systems negative.    Past Medical History:  Diagnosis Date  . Allergy    seasonal  . Bone cancer ([place]) 01/10/15 PET   lytic lesion lt 1st rib,T9,L3 Lt acetabulum  . Brain cancer ([place]) 01/13/15   MRI multiple small brain mets 10 individual lesions  . Bronchitis   . H/O reactive hypoglycemia   . IBS (irritable bowel syndrome)   . Lung cancer ([place])    stageIV nscca rul  . Osteopenia   . Paronychia 11/17/2015  . Pneumonia 2009 & 2011  . S/P radiation therapy completed 02/08/15   whole brain ,spine  . S/P radiation therapy completed 11/27/15   lung    Past Surgical History:  Procedure Laterality Date  . COLONOSCOPY  2009   Negative; [facility]  . ESOPHAGOGASTRODUODENOSCOPY (EGD) WITH PROPOFOL N/A 04/11/2015   Procedure: ESOPHAGOGASTRODUODENOSCOPY (EGD) WITH PROPOFOL;  Surgeon: Carol Ada, MD;  Location: [facility];  Service: Endoscopy;  Laterality: N/A;  . PEG PLACEMENT N/A 04/11/2015   Procedure: PERCUTANEOUS  ENDOSCOPIC GASTROSTOMY (PEG) PLACEMENT;  Surgeon: Carol Ada, MD;  Location: [facility];  Service: Endoscopy;  Laterality: N/A;  . TONSILLECTOMY AND ADENOIDECTOMY    . WISDOM TOOTH EXTRACTION       reports that she has never smoked. She has never used smokeless tobacco. She reports that she drinks alcohol. She reports that she does not use drugs.  Allergies  Allergen Reactions  . Sulfonamide Derivatives Rash    Childhood reaction    Family History  Problem Relation Age of Onset  . Transient ischemic attack Mother   . Mental illness Mother     Dementia  . Diabetes Father   . Heart attack Father 60  . Heart attack Maternal Grandfather     in 51s  . Stomach cancer Maternal Aunt   . Heart attack Maternal Aunt     in 80s  . Diabetes Paternal Uncle     X 3      Prior to Admission medications   Medication Sig Start Date End Date Taking? Authorizing Provider  FeFum-FePoly-FA-B Cmp-C-Biot (INTEGRA PLUS) CAPS Take 1 capsule by mouth every morning. 02/19/16  Yes Curt Bears, MD  folic acid (FOLVITE) 1 MG tablet Take 1 tablet (1 mg total) by mouth daily. Patient taking differently: Take 1 mg by mouth every morning.  03/08/16  Yes Curt Bears, MD  HYDROcodone-acetaminophen (NORCO) 5-325 MG tablet Take 1-2 tablets by mouth every 4 (four) hours as  needed for moderate pain. 02/14/16  Yes Hayden Pedro, PA-C  levETIRAcetam (KEPPRA) 500 MG tablet 1 tablet in the morning and 1.5 tablets in the evening Patient taking differently: Take 500-750 mg by mouth 2 (two) times daily. 1 tablet in the morning and 1.5 tablets in the evening 04/30/16  Yes Kathrynn Ducking, MD  Nutritional Supplements (FEEDING SUPPLEMENT, OSMOLITE 1.5 CAL,) LIQD Place 237 mLs into feeding tube 4 (four) times daily. Patient taking differently: Place 237 mLs into feeding tube 3 (three) times daily.  04/14/15  Yes Carol Ada, MD  OLANZapine (ZYPREXA) 10 MG tablet TAKE 1 TABLET (10 MG TOTAL) BY MOUTH AT BEDTIME.  (INS PAYS 12/13/2015) 04/22/16  Yes Curt Bears, MD  traMADol (ULTRAM) 50 MG tablet Take 1 tablet (50 mg total) by mouth every 6 (six) hours as needed. Patient taking differently: Take 50 mg by mouth every 6 (six) hours as needed for moderate pain or severe pain.  02/19/16  Yes Curt Bears, MD  dexamethasone (DECADRON) 4 MG tablet Take 1 tablet (4 mg total) by mouth 4 (four) times daily. Patient not taking: Reported on 05/11/2016 02/22/16   Hayden Pedro, PA-C    Physical Exam: Vitals:   05/11/16 1937 05/11/16 1945 05/11/16 2000 05/11/16 2100  BP: 116/75  110/77 104/66  Pulse: 94 94    Resp: 21 (!) 32 (!) 33 (!) 33  Temp: 97.9 F (36.6 C)     TempSrc: Oral     SpO2: 97% 96%        Constitutional: NAD, calm, comfortable Eyes: PERRL, lids and conjunctivae normal ENMT: Mucous membranes are moist. Posterior pharynx clear of any exudate or lesions.Normal dentition.  Neck: normal, supple, no masses, no thyromegaly Respiratory: clear to auscultation bilaterally, no wheezing, no crackles. Normal respiratory effort. No accessory muscle use.  Cardiovascular: Regular rate and rhythm, no murmurs / rubs / gallops. No extremity edema. 2+ pedal pulses. No carotid bruits.  Abdomen: no tenderness, no masses palpated. No hepatosplenomegaly. Bowel sounds positive.  Musculoskeletal: no clubbing / cyanosis. No joint deformity upper and lower extremities. Good ROM, no contractures. Normal muscle tone.  Skin: no rashes, lesions, ulcers. No induration Neurologic: CN 2-12 grossly intact. Sensation intact, DTR normal. Strength 5/5 in all 4.  Psychiatric: Normal judgment and insight. Alert and oriented x 3. Normal mood.    Labs on Admission: I have personally reviewed following labs and imaging studies  CBC:  Recent Labs Lab 05/08/16 1050 05/11/16 1210  WBC 4.7 5.1  NEUTROABS 3.9 4.1  HGB 7.2* 9.8*  HCT 21.8* 29.2*  MCV 95.2 93.0  PLT 52* 50*   Basic Metabolic Panel:  Recent  Labs Lab 05/08/16 1050 05/11/16 1210  NA 139 138  K 4.1 3.5  CL  --  107  CO2 21* 23  GLUCOSE 174* 135*  BUN 26.7* 27*  CREATININE 0.8 0.77  CALCIUM 9.2 8.1*   GFR: Estimated Creatinine Clearance: 67.9 mL/min (by C-G formula based on SCr of 0.77 mg/dL). Liver Function Tests:  Recent Labs Lab 05/08/16 1050 05/11/16 1210  AST 24 33  ALT 24 35  ALKPHOS 74 67  BILITOT 0.43 0.5  PROT 7.0 6.5  ALBUMIN 2.6* 2.7*   No results for input(s): LIPASE, AMYLASE in the last 168 hours. No results for input(s): AMMONIA in the last 168 hours. Coagulation Profile: No results for input(s): INR, PROTIME in the last 168 hours. Cardiac Enzymes: No results for input(s): CKTOTAL, CKMB, CKMBINDEX, TROPONINI in the last 168 hours.  BNP (last 3 results) No results for input(s): PROBNP in the last 8760 hours. HbA1C: No results for input(s): HGBA1C in the last 72 hours. CBG: No results for input(s): GLUCAP in the last 168 hours. Lipid Profile: No results for input(s): CHOL, HDL, LDLCALC, TRIG, CHOLHDL, LDLDIRECT in the last 72 hours. Thyroid Function Tests: No results for input(s): TSH, T4TOTAL, FREET4, T3FREE, THYROIDAB in the last 72 hours. Anemia Panel: No results for input(s): VITAMINB12, FOLATE, FERRITIN, TIBC, IRON, RETICCTPCT in the last 72 hours. Urine analysis:    Component Value Date/Time   COLORURINE YELLOW 05/11/2016 1215   APPEARANCEUR CLEAR 05/11/2016 1215   LABSPEC 1.021 05/11/2016 1215   PHURINE 6.0 05/11/2016 1215   GLUCOSEU NEGATIVE 05/11/2016 1215   HGBUR MODERATE (A) 05/11/2016 1215   BILIRUBINUR NEGATIVE 05/11/2016 1215   KETONESUR NEGATIVE 05/11/2016 1215   PROTEINUR NEGATIVE 05/11/2016 1215   UROBILINOGEN 1.0 02/11/2015 0748   NITRITE NEGATIVE 05/11/2016 1215   LEUKOCYTESUR NEGATIVE 05/11/2016 1215   Sepsis Labs: @LABRCNTIP (procalcitonin:4,lacticidven:4) )No results found for this or any previous visit (from the past 240 hour(s)).   Radiological Exams on  Admission: Ct Head W & Wo Contrast  Result Date: 05/11/2016 CLINICAL DATA:  Follow-up lung cancer. EXAM: CT HEAD WITHOUT AND WITH CONTRAST CT CHEST, ABDOMEN AND PELVIS WITH CONTRAST TECHNIQUE: Contiguous axial images were obtained from the base of the skull through the vertex without and with intravenous contrast. Multidetector CT imaging of the chest, abdomen and pelvis was performed following the standard protocol during bolus administration of intravenous contrast. CONTRAST:  117m ISOVUE-300 IOPAMIDOL (ISOVUE-300) INJECTION 61%, 32mISOVUE-300 IOPAMIDOL (ISOVUE-300) INJECTION 61%<Contrast>10083mSOVUE-300 IOPAMIDOL (ISOVUE-300) INJECTION 61%, 4m49mOVUE-300 IOPAMIDOL (ISOVUE-300) INJECTION 61% COMPARISON:  CT scan of the brain March 09, 2016, MRI brain March 10, 2016, and CT of the chest/abdomen/ pelvis January 15, 2016 FINDINGS: CT HEAD FINDINGS Brain: No subdural, epidural, or subarachnoid. High attenuation in the right basal ganglia on series 2, image 12 is consistent with calcification. No intraparenchymal hemorrhage is identified. White matter changes are stable and severe. No acute cortical ischemia or infarct. The cerebellum and brainstem are normal. The basal cisterns are unremarkable. No midline shift. No enhancing metastatic disease identified. Vascular: No hyperdense vessel or unexpected calcification. Skull: Normal. Negative for fracture or focal lesion. Sinuses/Orbits: No acute finding. Other: No other abnormalities. CT CHEST FINDINGS Cardiovascular: No significant vascular findings. Normal heart size. Mediastinum/Nodes: No adenopathy is seen within the chest. The thyroid is normal in appearance. The thoracic aorta is non aneurysmal. A small amount of pericardial fluid is stable. The heart size is within normal limits. Mild coronary artery calcifications are noted. The central great vessels otherwise normal. Lungs/Pleura: The central airways are normal. No pneumothorax. The timing of the contrast  is suboptimal limiting evaluation of the patient's known lung malignancies. The 2 separate lesions in the right upper chest on the previous study are difficult to differentiate on today's study. Additionally, there is increased infiltrate surrounding the known malignancy. The subpleural nodule seen on the previous study is difficult to measure due to timing of contrast. However, it appears to be similar in size measuring 2.0 x 2.7 cm today versus 2.3 x 2.7 cm previously. The larger slightly more inferior mass is also difficult to measure but measures approximately 3.0 by 2.8 cm today versus 3.5 x 3.2 cm previously. There is increased infiltrate surrounding this mass and extending towards the suprahilar region on the right. By report, the patient's last radiation therapy to the lung was  April 2017. The infiltrate in the medial right lower lobe is likely unchanged but obscured by an enlarging right-sided pleural effusion and atelectasis. The right-sided pleural effusion is larger in the interval. There is a small left effusion now present as well. There is interlobular septal thickening in the right upper lobe which is mild but new. No other changes on the right. There is atelectasis in the left base. No suspicious nodules or masses are seen in the left lung. Musculoskeletal: The mixed lytic and sclerotic lesion with pathologic fracture in T9 is stable. A sclerotic focus the anterior aspect of C7 is stable. A sclerotic focus near the superior endplate of G40 is stable. Sclerosis in the manubrium is unchanged. Sclerosis in the anterior eleventh seventh rib is stable. Sclerosis in the posterior left fifth rib is stable. No entirely new bony metastatic lesions are seen within the chest. CT ABDOMEN AND PELVIS FINDINGS Hepatobiliary: Evaluation of the parenchymal organs is limited due to timing of contrast. There is a cyst in the left hepatic lobe. Another seen in the right hepatic lobe. No liver metastases are identified.  The gallbladder and portal vein are unremarkable. Pancreas: No mass, inflammatory changes, or other significant abnormality. Spleen: Within normal limits in size and appearance. Adrenals/Urinary Tract: The adrenal glands are normal in appearance. No suspicious renal masses are seen. There is a nodule however posterior to the left kidney on series 2, image 59 measuring 14 by 12 mm, new in the interval. Stomach/Bowel: A PEG tube is identified. The stomach and small bowel are otherwise normal. The colon and appendix are unremarkable. The Vascular/Lymphatic: The abdominal aorta is non aneurysmal. Mild atherosclerotic change in the aorta. No adenopathy. Reproductive: No mass or other significant abnormality. Musculoskeletal: A tiny sclerotic focus in T12 is stable. Metastatic disease and L2 and L3 with endplate irregularity is stable. No disease and L4. Sclerosis in the lateral aspect of L5 is stable. 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 No new bony metastatic disease seen in the abdomen or pelvis today. Other: There is a small amount of free fluid in the pelvis which is stable. No additional suspicious abnormalities. IMPRESSION: 1. No intracranial abnormalities identified. No metastatic disease. MRI imaging would be more sensitive and specific for metastatic disease in the brain. 2. Evaluation of the lesions in the chest is limited due to poor timing of contrast. The subpleural nodule in the right upper lobe is similar to slightly smaller in the interval. The larger more inferior nodule in the right upper lobe is a little smaller in the interval. Increased opacity adjacent to these right upper lobe masses, extending centrally towards the right hilum is nonspecific and could represent an infectious or inflammatory process. Evolving postradiation change or worsening malignancy are also possibilities. Recommend attention on follow-up. Interlobular septal thickening in the right upper lobe is  mild but new and lymphangitic spread of tumor is not excluded. 3. Bony metastatic disease is essentially stable in the interval. 4. There is a new soft tissue nodule posterior to the left kidney consistent with metastatic disease. This is new in the interval. Electronically Signed   By: Dorise Bullion III M.D   On: 05/11/2016 20:23   Ct Chest W Contrast  Result Date: 05/11/2016 CLINICAL DATA:  Follow-up lung cancer. EXAM: CT HEAD WITHOUT AND WITH CONTRAST CT CHEST, ABDOMEN AND PELVIS WITH CONTRAST TECHNIQUE: Contiguous axial images were obtained from the base of the skull through the vertex without and with intravenous contrast. Multidetector  CT imaging of the chest, abdomen and pelvis was performed following the standard protocol during bolus administration of intravenous contrast. CONTRAST:  167m ISOVUE-300 IOPAMIDOL (ISOVUE-300) INJECTION 61%, 369mISOVUE-300 IOPAMIDOL (ISOVUE-300) INJECTION 61%<Contrast>10043mSOVUE-300 IOPAMIDOL (ISOVUE-300) INJECTION 61%, 52m70mOVUE-300 IOPAMIDOL (ISOVUE-300) INJECTION 61% COMPARISON:  CT scan of the brain March 09, 2016, MRI brain March 10, 2016, and CT of the chest/abdomen/ pelvis January 15, 2016 FINDINGS: CT HEAD FINDINGS Brain: No subdural, epidural, or subarachnoid. High attenuation in the right basal ganglia on series 2, image 12 is consistent with calcification. No intraparenchymal hemorrhage is identified. White matter changes are stable and severe. No acute cortical ischemia or infarct. The cerebellum and brainstem are normal. The basal cisterns are unremarkable. No midline shift. No enhancing metastatic disease identified. Vascular: No hyperdense vessel or unexpected calcification. Skull: Normal. Negative for fracture or focal lesion. Sinuses/Orbits: No acute finding. Other: No other abnormalities. CT CHEST FINDINGS Cardiovascular: No significant vascular findings. Normal heart size. Mediastinum/Nodes: No adenopathy is seen within the chest. The thyroid is  normal in appearance. The thoracic aorta is non aneurysmal. A small amount of pericardial fluid is stable. The heart size is within normal limits. Mild coronary artery calcifications are noted. The central great vessels otherwise normal. Lungs/Pleura: The central airways are normal. No pneumothorax. The timing of the contrast is suboptimal limiting evaluation of the patient's known lung malignancies. The 2 separate lesions in the right upper chest on the previous study are difficult to differentiate on today's study. Additionally, there is increased infiltrate surrounding the known malignancy. The subpleural nodule seen on the previous study is difficult to measure due to timing of contrast. However, it appears to be similar in size measuring 2.0 x 2.7 cm today versus 2.3 x 2.7 cm previously. The larger slightly more inferior mass is also difficult to measure but measures approximately 3.0 by 2.8 cm today versus 3.5 x 3.2 cm previously. There is increased infiltrate surrounding this mass and extending towards the suprahilar region on the right. By report, the patient's last radiation therapy to the lung was April 2017. The infiltrate in the medial right lower lobe is likely unchanged but obscured by an enlarging right-sided pleural effusion and atelectasis. The right-sided pleural effusion is larger in the interval. There is a small left effusion now present as well. There is interlobular septal thickening in the right upper lobe which is mild but new. No other changes on the right. There is atelectasis in the left base. No suspicious nodules or masses are seen in the left lung. Musculoskeletal: The mixed lytic and sclerotic lesion with pathologic fracture in T9 is stable. A sclerotic focus the anterior aspect of C7 is stable. A sclerotic focus near the superior endplate of T11 M54stable. Sclerosis in the manubrium is unchanged. Sclerosis in the anterior eleventh seventh rib is stable. Sclerosis in the posterior  left fifth rib is stable. No entirely new bony metastatic lesions are seen within the chest. CT ABDOMEN AND PELVIS FINDINGS Hepatobiliary: Evaluation of the parenchymal organs is limited due to timing of contrast. There is a cyst in the left hepatic lobe. Another seen in the right hepatic lobe. No liver metastases are identified. The gallbladder and portal vein are unremarkable. Pancreas: No mass, inflammatory changes, or other significant abnormality. Spleen: Within normal limits in size and appearance. Adrenals/Urinary Tract: The adrenal glands are normal in appearance. No suspicious renal masses are seen. There is a nodule however posterior to the left kidney on series 2, image 59  measuring 14 by 12 mm, new in the interval. Stomach/Bowel: A PEG tube is identified. The stomach and small bowel are otherwise normal. The colon and appendix are unremarkable. The Vascular/Lymphatic: The abdominal aorta is non aneurysmal. Mild atherosclerotic change in the aorta. No adenopathy. Reproductive: No mass or other significant abnormality. Musculoskeletal: A tiny sclerotic focus in T12 is stable. Metastatic disease and L2 and L3 with endplate irregularity is stable. No disease and L4. Sclerosis in the lateral aspect of L5 is stable. 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 No new bony metastatic disease seen in the abdomen or pelvis today. Other: There is a small amount of free fluid in the pelvis which is stable. No additional suspicious abnormalities. IMPRESSION: 1. No intracranial abnormalities identified. No metastatic disease. MRI imaging would be more sensitive and specific for metastatic disease in the brain. 2. Evaluation of the lesions in the chest is limited due to poor timing of contrast. The subpleural nodule in the right upper lobe is similar to slightly smaller in the interval. The larger more inferior nodule in the right upper lobe is a little smaller in the interval. Increased opacity  adjacent to these right upper lobe masses, extending centrally towards the right hilum is nonspecific and could represent an infectious or inflammatory process. Evolving postradiation change or worsening malignancy are also possibilities. Recommend attention on follow-up. Interlobular septal thickening in the right upper lobe is mild but new and lymphangitic spread of tumor is not excluded. 3. Bony metastatic disease is essentially stable in the interval. 4. There is a new soft tissue nodule posterior to the left kidney consistent with metastatic disease. This is new in the interval. Electronically Signed   By: Dorise Bullion III M.D   On: 05/11/2016 20:23   Ct Abdomen Pelvis W Contrast  Result Date: 05/11/2016 CLINICAL DATA:  Follow-up lung cancer. EXAM: CT HEAD WITHOUT AND WITH CONTRAST CT CHEST, ABDOMEN AND PELVIS WITH CONTRAST TECHNIQUE: Contiguous axial images were obtained from the base of the skull through the vertex without and with intravenous contrast. Multidetector CT imaging of the chest, abdomen and pelvis was performed following the standard protocol during bolus administration of intravenous contrast. CONTRAST:  147m ISOVUE-300 IOPAMIDOL (ISOVUE-300) INJECTION 61%, 36mISOVUE-300 IOPAMIDOL (ISOVUE-300) INJECTION 61%<Contrast>10022mSOVUE-300 IOPAMIDOL (ISOVUE-300) INJECTION 61%, 24m47mOVUE-300 IOPAMIDOL (ISOVUE-300) INJECTION 61% COMPARISON:  CT scan of the brain March 09, 2016, MRI brain March 10, 2016, and CT of the chest/abdomen/ pelvis January 15, 2016 FINDINGS: CT HEAD FINDINGS Brain: No subdural, epidural, or subarachnoid. High attenuation in the right basal ganglia on series 2, image 12 is consistent with calcification. No intraparenchymal hemorrhage is identified. White matter changes are stable and severe. No acute cortical ischemia or infarct. The cerebellum and brainstem are normal. The basal cisterns are unremarkable. No midline shift. No enhancing metastatic disease identified.  Vascular: No hyperdense vessel or unexpected calcification. Skull: Normal. Negative for fracture or focal lesion. Sinuses/Orbits: No acute finding. Other: No other abnormalities. CT CHEST FINDINGS Cardiovascular: No significant vascular findings. Normal heart size. Mediastinum/Nodes: No adenopathy is seen within the chest. The thyroid is normal in appearance. The thoracic aorta is non aneurysmal. A small amount of pericardial fluid is stable. The heart size is within normal limits. Mild coronary artery calcifications are noted. The central great vessels otherwise normal. Lungs/Pleura: The central airways are normal. No pneumothorax. The timing of the contrast is suboptimal limiting evaluation of the patient's known lung malignancies. The 2 separate lesions in the right  upper chest on the previous study are difficult to differentiate on today's study. Additionally, there is increased infiltrate surrounding the known malignancy. The subpleural nodule seen on the previous study is difficult to measure due to timing of contrast. However, it appears to be similar in size measuring 2.0 x 2.7 cm today versus 2.3 x 2.7 cm previously. The larger slightly more inferior mass is also difficult to measure but measures approximately 3.0 by 2.8 cm today versus 3.5 x 3.2 cm previously. There is increased infiltrate surrounding this mass and extending towards the suprahilar region on the right. By report, the patient's last radiation therapy to the lung was April 2017. The infiltrate in the medial right lower lobe is likely unchanged but obscured by an enlarging right-sided pleural effusion and atelectasis. The right-sided pleural effusion is larger in the interval. There is a small left effusion now present as well. There is interlobular septal thickening in the right upper lobe which is mild but new. No other changes on the right. There is atelectasis in the left base. No suspicious nodules or masses are seen in the left lung.  Musculoskeletal: The mixed lytic and sclerotic lesion with pathologic fracture in T9 is stable. A sclerotic focus the anterior aspect of C7 is stable. A sclerotic focus near the superior endplate of O96 is stable. Sclerosis in the manubrium is unchanged. Sclerosis in the anterior eleventh seventh rib is stable. Sclerosis in the posterior left fifth rib is stable. No entirely new bony metastatic lesions are seen within the chest. CT ABDOMEN AND PELVIS FINDINGS Hepatobiliary: Evaluation of the parenchymal organs is limited due to timing of contrast. There is a cyst in the left hepatic lobe. Another seen in the right hepatic lobe. No liver metastases are identified. The gallbladder and portal vein are unremarkable. Pancreas: No mass, inflammatory changes, or other significant abnormality. Spleen: Within normal limits in size and appearance. Adrenals/Urinary Tract: The adrenal glands are normal in appearance. No suspicious renal masses are seen. There is a nodule however posterior to the left kidney on series 2, image 59 measuring 14 by 12 mm, new in the interval. Stomach/Bowel: A PEG tube is identified. The stomach and small bowel are otherwise normal. The colon and appendix are unremarkable. The Vascular/Lymphatic: The abdominal aorta is non aneurysmal. Mild atherosclerotic change in the aorta. No adenopathy. Reproductive: No mass or other significant abnormality. Musculoskeletal: A tiny sclerotic focus in T12 is stable. Metastatic disease and L2 and L3 with endplate irregularity is stable. No disease and L4. Sclerosis in the lateral aspect of L5 is stable. 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 No new bony metastatic disease seen in the abdomen or pelvis today. Other: There is a small amount of free fluid in the pelvis which is stable. No additional suspicious abnormalities. IMPRESSION: 1. No intracranial abnormalities identified. No metastatic disease. MRI imaging would be more sensitive  and specific for metastatic disease in the brain. 2. Evaluation of the lesions in the chest is limited due to poor timing of contrast. The subpleural nodule in the right upper lobe is similar to slightly smaller in the interval. The larger more inferior nodule in the right upper lobe is a little smaller in the interval. Increased opacity adjacent to these right upper lobe masses, extending centrally towards the right hilum is nonspecific and could represent an infectious or inflammatory process. Evolving postradiation change or worsening malignancy are also possibilities. Recommend attention on follow-up. Interlobular septal thickening in the right upper lobe  is mild but new and lymphangitic spread of tumor is not excluded. 3. Bony metastatic disease is essentially stable in the interval. 4. There is a new soft tissue nodule posterior to the left kidney consistent with metastatic disease. This is new in the interval. Electronically Signed   By: Dorise Bullion III M.D   On: 05/11/2016 20:23   Dg Chest Port 1 View  Result Date: 05/11/2016 CLINICAL DATA:  Worsening weakness today. Metastatic lung carcinoma. EXAM: PORTABLE CHEST 1 VIEW COMPARISON:  Chest CT on 01/15/2016 FINDINGS: Persistent mass seen in the lateral right upper lobe, similar in appearance to recent chest CT. No new or increased areas of pulmonary opacity are seen. No evidence of pneumothorax or pleural effusion. Heart size is normal. IMPRESSION: Persistent mass in lateral right upper lobe, similar in appearance to recent chest CT. No new findings. Electronically Signed   By: Earle Gell M.D.   On: 05/11/2016 12:56    EKG: Independently reviewed.  Assessment/Plan Principal Problem:   Weakness Active Problems:   Malignant neoplasm of upper lobe of right lung (HCC)   Brain metastases ([place])    1. Weakness - 1. Generalized weakness that has been slowly progressive until yesterday when it became severely worse 2. Admitting for  observation, gentle hydration 3. MRI brain to look for new brain metastatic disease ordered 2. Metastatic NSCLC - 1. IP consult to oncology placed in epic    DVT prophylaxis: Lovenox Code Status: Full Family Communication: Family at bedside Consults called: Put oncology consult into epic Admission status: Admit to obs   Etta Quill DO [facility] Pager [phone] from 7PM-7AM  If 7AM-7PM, please contact the day physician for the patient [email or web address] Password [email or web address]  05/11/2016, 9:28 PM

## 2016-05-11 NOTE — ED Triage Notes (Signed)
Patient with stage 4 lung cancer, started with increased weakness last night.  She was given 2 units of blood on Wednesday, but was unable to take chemo for the last 5 weeks due to abnormal lab values.

## 2016-05-11 NOTE — ED Notes (Signed)
Bed: VH46 Expected date:  Expected time:  Means of arrival:  Comments: EMS-cancer

## 2016-05-11 NOTE — ED Provider Notes (Signed)
[facility] DEPT Provider Note   CSN: [id number] Arrival date & time: 05/11/16  1111     History   Chief Complaint Chief Complaint  Patient presents with  . Weakness    HPI Laura Walter is a 65 y.o. female.   Weakness  Primary symptoms include no focal weakness, no loss of sensation, no loss of balance, no dizziness. This is a new problem. The current episode started more than 2 days ago. The problem has been gradually worsening. There was no focality noted. There has been no fever. The fever has been present for less than 1 day. Pertinent negatives include no shortness of breath and no vomiting. There were no medications administered prior to arrival. Associated medical issues do not include mood changes or seizures.    Past Medical History:  Diagnosis Date  . Allergy    seasonal  . Bone cancer ([place]) 01/10/15 PET   lytic lesion lt 1st rib,T9,L3 Lt acetabulum  . Brain cancer ([place]) 01/13/15   MRI multiple small brain mets 10 individual lesions  . Bronchitis   . H/O reactive hypoglycemia   . IBS (irritable bowel syndrome)   . Lung cancer ([place])    stageIV nscca rul  . Osteopenia   . Paronychia 11/17/2015  . Pneumonia 2009 & 2011  . S/P radiation therapy completed 02/08/15   whole brain ,spine  . S/P radiation therapy completed 11/27/15   lung    Patient Active Problem List   Diagnosis Date Noted  . Aphasia 03/12/2016  . Pain management 03/09/2016  . Dehydration 03/09/2016  . 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 Brain metastases ([place]) 02/14/2016  . Paronychia 11/17/2015  . Paronychia of great toe, right 08/21/2015  . Paronychia of great toe, left 08/21/2015  . Nausea without vomiting 04/24/2015  . Feeding difficulties 04/11/2015  . Encounter for antineoplastic chemotherapy 03/06/2015  . Hypokalemia 02/15/2015  . Protein-calorie malnutrition ([place]) 02/15/2015  . Neutropenic fever ([place]) 02/11/2015  . Neutropenia with fever ([place]) 02/11/2015  . Bicytopenia  02/11/2015  . Diarrhea 02/11/2015  . Malignant neoplasm of upper lobe of right lung ([place]) 01/05/2015  . 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 Metastasis to bone ([place]) 01/03/2015  . Physical exam 10/13/2014  . Osteopenia 10/07/2013  . Benign positional vertigo 05/08/2012  . TINNITUS, LEFT 01/09/2009  . CARDIAC MURMUR, AORTIC 01/05/2007  . MOTION SICKNESS 01/05/2007    Past Surgical History:  Procedure Laterality Date  . COLONOSCOPY  2009   Negative; [facility]  . ESOPHAGOGASTRODUODENOSCOPY (EGD) WITH PROPOFOL N/A 04/11/2015   Procedure: ESOPHAGOGASTRODUODENOSCOPY (EGD) WITH PROPOFOL;  Surgeon: Carol Ada, MD;  Location: [facility] END[facility]COPY;  Service: Endoscopy;  Laterality: N/A;  . PEG PLACEMENT N/A 04/11/2015   Procedure: PERCUTANEOUS ENDOSCOPIC GASTROSTOMY (PEG) PLACEMENT;  Surgeon: Carol Ada, MD;  Location: [facility] ENDOSCOPY;  Service: Endoscopy;  Laterality: N/A;  . TONSILLECTOMY AND ADENOIDECTOMY    . WISDOM TOOTH EXTRACTION      OB History    No data available       Home Medications    Prior to Admission medications   Medication Sig Start Date End Date Taking? Authorizing Provider  dexamethasone (DECADRON) 4 MG tablet Take 1 tablet (4 mg total) by mouth 4 (four) times daily. 02/22/16   Hayden Pedro, PA-C  FeFum-FePoly-FA-B Cmp-C-Biot (INTEGRA PLUS) CAPS Take 1 capsule by mouth every morning. 02/19/16   Curt Bears, MD  folic acid (FOLVITE) 1 MG tablet Take 1 tablet (1 mg total) by mouth daily. 03/08/16  Curt Bears, MD  HYDROcodone-acetaminophen [facility]) 5-325 MG tablet Take 1-2 tablets by mouth every 4 (four) hours as needed for moderate pain. 02/14/16   Hayden Pedro, PA-C  ibuprofen (ADVIL,MOTRIN) 200 MG tablet Take 200 mg by mouth every 6 (six) hours as needed for moderate pain. Reported on 12/19/2015    Historical Provider, MD  levETIRAcetam (KEPPRA) 500 MG tablet 1 tablet in the morning and 1.5 tablets in the evening 04/30/16   Kathrynn Ducking, MD  loperamide  (IMODIUM) 2 MG capsule Take 2 mg by mouth as needed for diarrhea or loose stools (Up to 8 times daily, if needed.). Reported on 12/19/2015    Historical Provider, MD  Nutritional Supplements (FEEDING SUPPLEMENT, OSMOLITE 1.5 CAL,) LIQD Place 237 mLs into feeding tube 4 (four) times daily. Patient taking differently: Place 237 mLs into feeding tube 2 (two) times daily.  04/14/15   Carol Ada, MD  OLANZapine (ZYPREXA) 10 MG tablet TAKE 1 TABLET (10 MG TOTAL) BY MOUTH AT BEDTIME. (INS PAYS 12/13/2015) 04/22/16   Curt Bears, MD  traMADol (ULTRAM) 50 MG tablet Take 1 tablet (50 mg total) by mouth every 6 (six) hours as needed. 02/19/16   Curt Bears, MD    Family History Family History  Problem Relation Age of Onset  . Transient ischemic attack Mother   . Mental illness Mother     Dementia  . Diabetes Father   . Heart attack Father 72  . Heart attack Maternal Grandfather     in 29s  . Stomach cancer Maternal Aunt   . Heart attack Maternal Aunt     in 72s  . Diabetes Paternal Uncle     X 3    Social History Social History  Substance Use Topics  . Smoking status: Never Smoker  . Smokeless tobacco: Never Used  . Alcohol use Yes     Comment: occasionally rare wine     Allergies   Sulfonamide derivatives   Review of Systems Review of Systems  Respiratory: Negative for shortness of breath.   Gastrointestinal: Negative for vomiting.  Neurological: Positive for weakness. Negative for dizziness, focal weakness, seizures, numbness and loss of balance.  All other systems reviewed and are negative.    Physical Exam Updated Vital Signs BP 107/67 (BP Location: Right Arm)   Pulse 95   Temp 97.6 F (36.4 C) (Oral)   Resp 18   SpO2 98%   Physical Exam  Constitutional: She is oriented to person, place, and time. She appears well-developed and well-nourished.  HENT:  Head: Normocephalic and atraumatic.  Neck: Normal range of motion.  Cardiovascular: Normal rate and regular  rhythm.   Pulmonary/Chest: Effort normal. No stridor. No respiratory distress.  Abdominal: She exhibits no distension.  Musculoskeletal: She exhibits no edema or deformity.  Neurological: She is alert and oriented to person, place, and time.  No altered mental status, able to give full seemingly accurate history.  Face is symmetric, EOM's intact, pupils equal and reactive, vision intact, tongue and uvula midline without deviation Upper and Lower extremity motor 5/5, intact pain perception in distal extremities, 2+ reflexes in biceps, patella and achilles tendons. Finger to nose normal, heel to shin normal. Walks without assistance or evident ataxia.    Skin: Skin is warm and dry.  Nursing note and vitals reviewed.    ED Treatments / Results  Labs (all labs ordered are listed, but only abnormal results are displayed) Labs Reviewed  COMPREHENSIVE METABOLIC PANEL - Abnormal; Notable for  the following:       Result Value   Glucose, Bld 135 (*)    BUN 27 (*)    Calcium 8.1 (*)    Albumin 2.7 (*)    All other components within normal limits  CBC WITH DIFFERENTIAL/PLATELET - Abnormal; Notable for the following:    RBC 3.14 (*)    Hemoglobin 9.8 (*)    HCT 29.2 (*)    RDW 18.4 (*)    Platelets 50 (*)    Lymphs Abs 0.2 (*)    All other components within normal limits  URINALYSIS, ROUTINE W REFLEX MICROSCOPIC (NOT AT [facility]) - Abnormal; Notable for the following:    Hgb urine dipstick MODERATE (*)    All other components within normal limits  CULTURE, BLOOD (ROUTINE X 2)  CULTURE, BLOOD (ROUTINE X 2)  URINE CULTURE  URINE MICROSCOPIC-ADD ON  I-STAT CG4 LACTIC ACID, ED  I-STAT CG4 LACTIC ACID, ED    EKG  EKG Interpretation None       Radiology Dg Chest Port 1 View  Result Date: 05/11/2016 CLINICAL DATA:  Worsening weakness today. Metastatic lung carcinoma. EXAM: PORTABLE CHEST 1 VIEW COMPARISON:  Chest CT on 01/15/2016 FINDINGS: Persistent mass seen in the lateral right upper  lobe, similar in appearance to recent chest CT. No new or increased areas of pulmonary opacity are seen. No evidence of pneumothorax or pleural effusion. Heart size is normal. IMPRESSION: Persistent mass in lateral right upper lobe, similar in appearance to recent chest CT. No new findings. Electronically Signed   By: Earle Gell M.D.   On: 05/11/2016 12:56    Procedures Procedures (including critical care time)  Medications Ordered in ED Medications  iopamidol (ISOVUE-300) 61 % injection 15 mL (not administered)  sodium chloride 0.9 % bolus 1,000 mL (0 mLs Intravenous Stopped 05/11/16 1347)     Initial Impression / Assessment and Plan / ED Course  I have reviewed the triage vital signs and the nursing notes.  Pertinent labs & imaging results that were available during my care of the patient were reviewed by me and considered in my medical decision making (see chart for details).  Clinical Course    Generalized weakness. No e/o or s/s of infection. Suspect possible worsening metastatic disease. Awaiting CT scan to eval for any new issues that could cause symptoms that require intervention or hospitalization.  Care management involved and will work on home PT/OT/RN/NA help.  Care transferred to Dr. Winfred Leeds awaiting CT results and likely discharge.   Final Clinical Impressions(s) / ED Diagnoses   Final diagnoses:  Weakness  Dehydration    New Prescriptions New Prescriptions   No medications on file     Merrily Pew, MD 05/11/16 1733

## 2016-05-11 NOTE — Care Management Note (Signed)
Case Management Note  Patient Details  Name: Laura Walter MRN: [id number] Date of Birth: September 06, 1950  Subjective/Objective:                    Action/Plan: Spoke with Brenton Grills, Representative for [facility] to arrange [facility], HHPT and [facility] Nurse Aide. He will also arrange for pt to receive Hospital Bed if this is what she needs at home. [facility] had [facility] chosen [facility] who only provides Non skilled services such as house work, grocery shopping etc.. No Further care management needs.    Expected Discharge Date:                  Expected Discharge Plan:  Home/Self Care  In-House Referral:  NA  Discharge planning Services  CM Consult  Post Acute Care Choice:  Home Health Choice offered to:  Patient (Partner Teodoro Spray with the verbal permission of pt)  DME Arranged:  Hospital bed (Has RW, BSC, W/C) DME Agency:  [place]:  RN, PT, Nurse's Aide [place] Agency:  Other - See comment ([facility])  Status of Service:  Completed, signed off  If discussed at [place] of Stay Meetings, dates discussed:    Additional Comments:  Delrae Sawyers, RN 05/11/2016, 4:28 PM

## 2016-05-11 NOTE — ED Notes (Signed)
Patient transported to CT 

## 2016-05-12 ENCOUNTER — Observation Stay (HOSPITAL_COMMUNITY): Payer: BC Managed Care – PPO

## 2016-05-12 DIAGNOSIS — C799 Secondary malignant neoplasm of unspecified site: Secondary | ICD-10-CM | POA: Diagnosis present

## 2016-05-12 DIAGNOSIS — R531 Weakness: Secondary | ICD-10-CM | POA: Diagnosis not present

## 2016-05-12 DIAGNOSIS — C7931 Secondary malignant neoplasm of brain: Secondary | ICD-10-CM | POA: Diagnosis not present

## 2016-05-12 DIAGNOSIS — E860 Dehydration: Secondary | ICD-10-CM | POA: Diagnosis not present

## 2016-05-12 DIAGNOSIS — C3411 Malignant neoplasm of upper lobe, right bronchus or lung: Secondary | ICD-10-CM | POA: Diagnosis not present

## 2016-05-12 DIAGNOSIS — R112 Nausea with vomiting, unspecified: Secondary | ICD-10-CM | POA: Diagnosis not present

## 2016-05-12 LAB — CBC
HCT: 27 % — ABNORMAL LOW (ref 36.0–46.0)
HEMOGLOBIN: 9 g/dL — AB (ref 12.0–15.0)
MCH: 31.8 pg (ref 26.0–34.0)
MCHC: 33.3 g/dL (ref 30.0–36.0)
MCV: 95.4 fL (ref 78.0–100.0)
PLATELETS: 47 10*3/uL — AB (ref 150–400)
RBC: 2.83 MIL/uL — AB (ref 3.87–5.11)
RDW: 17.9 % — ABNORMAL HIGH (ref 11.5–15.5)
WBC: 3.8 10*3/uL — ABNORMAL LOW (ref 4.0–10.5)

## 2016-05-12 LAB — BASIC METABOLIC PANEL
Anion gap: 8 (ref 5–15)
BUN: 19 mg/dL (ref 6–20)
CHLORIDE: 108 mmol/L (ref 101–111)
CO2: 21 mmol/L — ABNORMAL LOW (ref 22–32)
Calcium: 8 mg/dL — ABNORMAL LOW (ref 8.9–10.3)
Creatinine, Ser: 0.72 mg/dL (ref 0.44–1.00)
GFR calc Af Amer: >60 mL/min (ref >60)
GFR calc non Af Amer: >60 mL/min (ref >60)
GLUCOSE: 97 mg/dL (ref 65–99)
POTASSIUM: 3.7 mmol/L (ref 3.5–5.1)
Sodium: 137 mmol/L (ref 135–145)

## 2016-05-12 LAB — URINE CULTURE: CULTURE: NO GROWTH

## 2016-05-12 MED ORDER — GADOBENATE DIMEGLUMINE 529 MG/ML IV SOLN
12.0000 mL | Freq: Once | INTRAVENOUS | Status: AC | PRN
  Administered 2016-05-12: 12 mL via INTRAVENOUS

## 2016-05-12 NOTE — Progress Notes (Signed)
Called Pt's son Lennette Bihari, informed him Pt was getting MRI. Thomasene Lot, RN

## 2016-05-12 NOTE — Progress Notes (Signed)
PROGRESS NOTE    [name]  IRJ:[id number] DOB: [birth year] DOA: 05/11/2016 PCP: [name], MD    Brief Narrative:   Laura Walter is a 65 y.o. female with medical history significant of metastatic lung cancer with brain met.  Patient presented to ED with progressive generalized weakness.  She had been getting progressively weaker over the past weeks-months but this has especially been severe over the past 1 day where she has become too weak to stand up.  Weakness is in both arms and both legs, no numbness, tingling, pain.  No SOB, no vomiting, no fever, has chronic cough that is unchanged.  No recent seizures.  ED Course: whole body CT scan unremarkable other than a tiny new nodule behind left kidney, some non-specific haziness next to a lung lesion.  UA negative, no SIRS.  BMP unremarkable.  CBC shows improvement of anemia post transfusion.   Assessment & Plan:   Principal Problem:   Weakness Active Problems:   Malignant neoplasm of upper lobe of right lung ([place])   Brain metastases ([place])  #1 generalized weakness Patient with generalized weakness with left lower extremity weakness greater than right lower extremity weakness. Likely secondary to new 2 mm metastatic lesion in the cerebellum noted on MRI of the brain. Patient with no signs or symptoms of infection. CT chest abdomen and pelvis unremarkable other than a tiny new nodule behind left kidney. Continue Keppra for seizure prophylaxis. IV fluids. PT/OT. Follow.  #2 dehydration IV fluids  #3 metastatic non-small cell lung cancer CT chest, abdomen, pelvis done was unremarkable other than a tiny new nodule behind the left kidney. Patient had presented with generalized weakness and a such MRI of the brain was obtained showed a new 2 mm metastatic lesion in the cerebellum. Continue Keppra for seizure prophylaxis. Will consult with oncology for further evaluation and management.  #4 pancytopenia Likely cigarettes a metastatic  non-small cell lung cancer. Patient with no bleeding. Follow. Patient will be seen by hematology/oncology.  #5   DVT prophylaxis: SCDs Code Status: Full Family Communication: Updated patient and significant other at bedside. Disposition Plan: Pending oncology evaluation and PT evaluation.   Consultants:   Oncology via Epic  Procedures:   MRI brain 05/12/2016  Chest x-ray 05/11/2016  CT head/CT chest/CT abdomen and pelvis 05/11/2016  Antimicrobials:   None   Subjective: Patient sleeping however easily arousable. No chest pain. No shortness of breath. Patient complaining of generalized weakness.  Objective: Vitals:   05/11/16 2000 05/11/16 2100 05/11/16 2230 05/12/16 0550  BP: 110/77 104/66 119/71 120/77  Pulse:   94 (!) 103  Resp: (!) 33 (!) 33 (!) 22 18  Temp:   97.6 F (36.4 C) 99 F (37.2 C)  TempSrc:   Oral Oral  SpO2:   96% 96%  Weight:   59.6 kg (131 lb 6.3 oz)   Height:   _0  (1.676 m)     Intake/Output Summary (Last 24 hours) at 05/12/16 1244 Last data filed at 05/12/16 0550  Gross per 24 hour  Intake           358.33 ml  Output              350 ml  Net             8.33 ml   Filed Weights   05/11/16 2230  Weight: 59.6 kg (131 lb 6.3 oz)    Examination:  General exam: Appears calm and comfortable.Dry mucous membranes.  Respiratory system: Clear to auscultation. Respiratory effort normal. Cardiovascular system: S1 & S2 heard, RRR. No JVD, murmurs, rubs, gallops or clicks. No pedal edema. Gastrointestinal system: Abdomen is nondistended, soft and nontender. No organomegaly or masses felt. Normal bowel sounds heard. Central nervous system: Alert and oriented. No focal neurological deficits. Extremities: 5/5 BUE strength. 2/5 LLE strength.  4/5 RLE strength. Skin: No rashes, lesions or ulcers Psychiatry: Judgement and insight appear normal. Mood & affect appropriate.     Data Reviewed: I have personally reviewed following labs and imaging  studies  CBC:  Recent Labs Lab 05/08/16 1050 05/11/16 1210 05/12/16 0418  WBC 4.7 5.1 3.8*  NEUTROABS 3.9 4.1  --   HGB 7.2* 9.8* 9.0*  HCT 21.8* 29.2* 27.0*  MCV 95.2 93.0 95.4  PLT 52* 50* 47*   Basic Metabolic Panel:  Recent Labs Lab 05/08/16 1050 05/11/16 1210 05/12/16 0418  NA 139 138 137  K 4.1 3.5 3.7  CL  --  107 108  CO2 21* 23 21*  GLUCOSE 174* 135* 97  BUN 26.7* 27* 19  CREATININE 0.8 0.77 0.72  CALCIUM 9.2 8.1* 8.0*   GFR: Estimated Creatinine Clearance: 68.3 mL/min (by C-G formula based on SCr of 0.72 mg/dL). Liver Function Tests:  Recent Labs Lab 05/08/16 1050 05/11/16 1210  AST 24 33  ALT 24 35  ALKPHOS 74 67  BILITOT 0.43 0.5  PROT 7.0 6.5  ALBUMIN 2.6* 2.7*   No results for input(s): LIPASE, AMYLASE in the last 168 hours. No results for input(s): AMMONIA in the last 168 hours. Coagulation Profile: No results for input(s): INR, PROTIME in the last 168 hours. Cardiac Enzymes: No results for input(s): CKTOTAL, CKMB, CKMBINDEX, TROPONINI in the last 168 hours. BNP (last 3 results) No results for input(s): PROBNP in the last 8760 hours. HbA1C: No results for input(s): HGBA1C in the last 72 hours. CBG: No results for input(s): GLUCAP in the last 168 hours. Lipid Profile: No results for input(s): CHOL, HDL, LDLCALC, TRIG, CHOLHDL, LDLDIRECT in the last 72 hours. Thyroid Function Tests: No results for input(s): TSH, T4TOTAL, FREET4, T3FREE, THYROIDAB in the last 72 hours. Anemia Panel: No results for input(s): VITAMINB12, FOLATE, FERRITIN, TIBC, IRON, RETICCTPCT in the last 72 hours. Sepsis Labs:  Recent Labs Lab 05/11/16 1259 05/11/16 1557  LATICACIDVEN 1.34 0.69    No results found for this or any previous visit (from the past 240 hour(s)).       Radiology Studies: Ct Head W & Wo Contrast  Result Date: 05/11/2016 CLINICAL DATA:  Follow-up lung cancer. EXAM: CT HEAD WITHOUT AND WITH CONTRAST CT CHEST, ABDOMEN AND PELVIS WITH  CONTRAST TECHNIQUE: Contiguous axial images were obtained from the base of the skull through the vertex without and with intravenous contrast. Multidetector CT imaging of the chest, abdomen and pelvis was performed following the standard protocol during bolus administration of intravenous contrast. CONTRAST:  134m ISOVUE-300 IOPAMIDOL (ISOVUE-300) INJECTION 61%, 34mISOVUE-300 IOPAMIDOL (ISOVUE-300) INJECTION 61%<Contrast>10033mSOVUE-300 IOPAMIDOL (ISOVUE-300) INJECTION 61%, 37m63mOVUE-300 IOPAMIDOL (ISOVUE-300) INJECTION 61% COMPARISON:  CT scan of the brain March 09, 2016, MRI brain March 10, 2016, and CT of the chest/abdomen/ pelvis January 15, 2016 FINDINGS: CT HEAD FINDINGS Brain: No subdural, epidural, or subarachnoid. High attenuation in the right basal ganglia on series 2, image 12 is consistent with calcification. No intraparenchymal hemorrhage is identified. White matter changes are stable and severe. No acute cortical ischemia or infarct. The cerebellum and brainstem are normal. The basal cisterns are  unremarkable. No midline shift. No enhancing metastatic disease identified. Vascular: No hyperdense vessel or unexpected calcification. Skull: Normal. Negative for fracture or focal lesion. Sinuses/Orbits: No acute finding. Other: No other abnormalities. CT CHEST FINDINGS Cardiovascular: No significant vascular findings. Normal heart size. Mediastinum/Nodes: No adenopathy is seen within the chest. The thyroid is normal in appearance. The thoracic aorta is non aneurysmal. A small amount of pericardial fluid is stable. The heart size is within normal limits. Mild coronary artery calcifications are noted. The central great vessels otherwise normal. Lungs/Pleura: The central airways are normal. No pneumothorax. The timing of the contrast is suboptimal limiting evaluation of the patient's known lung malignancies. The 2 separate lesions in the right upper chest on the previous study are difficult to differentiate  on today's study. Additionally, there is increased infiltrate surrounding the known malignancy. The subpleural nodule seen on the previous study is difficult to measure due to timing of contrast. However, it appears to be similar in size measuring 2.0 x 2.7 cm today versus 2.3 x 2.7 cm previously. The larger slightly more inferior mass is also difficult to measure but measures approximately 3.0 by 2.8 cm today versus 3.5 x 3.2 cm previously. There is increased infiltrate surrounding this mass and extending towards the suprahilar region on the right. By report, the patient's last radiation therapy to the lung was April 2017. The infiltrate in the medial right lower lobe is likely unchanged but obscured by an enlarging right-sided pleural effusion and atelectasis. The right-sided pleural effusion is larger in the interval. There is a small left effusion now present as well. There is interlobular septal thickening in the right upper lobe which is mild but new. No other changes on the right. There is atelectasis in the left base. No suspicious nodules or masses are seen in the left lung. Musculoskeletal: The mixed lytic and sclerotic lesion with pathologic fracture in T9 is stable. A sclerotic focus the anterior aspect of C7 is stable. A sclerotic focus near the superior endplate of M35 is stable. Sclerosis in the manubrium is unchanged. Sclerosis in the anterior eleventh seventh rib is stable. Sclerosis in the posterior left fifth rib is stable. No entirely new bony metastatic lesions are seen within the chest. CT ABDOMEN AND PELVIS FINDINGS Hepatobiliary: Evaluation of the parenchymal organs is limited due to timing of contrast. There is a cyst in the left hepatic lobe. Another seen in the right hepatic lobe. No liver metastases are identified. The gallbladder and portal vein are unremarkable. Pancreas: No mass, inflammatory changes, or other significant abnormality. Spleen: Within normal limits in size and  appearance. Adrenals/Urinary Tract: The adrenal glands are normal in appearance. No suspicious renal masses are seen. There is a nodule however posterior to the left kidney on series 2, image 59 measuring 14 by 12 mm, new in the interval. Stomach/Bowel: A PEG tube is identified. The stomach and small bowel are otherwise normal. The colon and appendix are unremarkable. The Vascular/Lymphatic: The abdominal aorta is non aneurysmal. Mild atherosclerotic change in the aorta. No adenopathy. Reproductive: No mass or other significant abnormality. Musculoskeletal: A tiny sclerotic focus in T12 is stable. Metastatic disease and L2 and L3 with endplate irregularity is stable. No disease and L4. Sclerosis in the lateral aspect of L5 is stable. 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 No new bony metastatic disease seen in the abdomen or pelvis today. Other: There is a small amount of free fluid in the pelvis which is stable. No additional  suspicious abnormalities. IMPRESSION: 1. No intracranial abnormalities identified. No metastatic disease. MRI imaging would be more sensitive and specific for metastatic disease in the brain. 2. Evaluation of the lesions in the chest is limited due to poor timing of contrast. The subpleural nodule in the right upper lobe is similar to slightly smaller in the interval. The larger more inferior nodule in the right upper lobe is a little smaller in the interval. Increased opacity adjacent to these right upper lobe masses, extending centrally towards the right hilum is nonspecific and could represent an infectious or inflammatory process. Evolving postradiation change or worsening malignancy are also possibilities. Recommend attention on follow-up. Interlobular septal thickening in the right upper lobe is mild but new and lymphangitic spread of tumor is not excluded. 3. Bony metastatic disease is essentially stable in the interval. 4. There is a new soft tissue nodule  posterior to the left kidney consistent with metastatic disease. This is new in the interval. Electronically Signed   By: Dorise Bullion III M.D   On: 05/11/2016 20:23   Ct Chest W Contrast  Result Date: 05/11/2016 CLINICAL DATA:  Follow-up lung cancer. EXAM: CT HEAD WITHOUT AND WITH CONTRAST CT CHEST, ABDOMEN AND PELVIS WITH CONTRAST TECHNIQUE: Contiguous axial images were obtained from the base of the skull through the vertex without and with intravenous contrast. Multidetector CT imaging of the chest, abdomen and pelvis was performed following the standard protocol during bolus administration of intravenous contrast. CONTRAST:  155m ISOVUE-300 IOPAMIDOL (ISOVUE-300) INJECTION 61%, 376mISOVUE-300 IOPAMIDOL (ISOVUE-300) INJECTION 61%<Contrast>10085mSOVUE-300 IOPAMIDOL (ISOVUE-300) INJECTION 61%, 76m76mOVUE-300 IOPAMIDOL (ISOVUE-300) INJECTION 61% COMPARISON:  CT scan of the brain March 09, 2016, MRI brain March 10, 2016, and CT of the chest/abdomen/ pelvis January 15, 2016 FINDINGS: CT HEAD FINDINGS Brain: No subdural, epidural, or subarachnoid. High attenuation in the right basal ganglia on series 2, image 12 is consistent with calcification. No intraparenchymal hemorrhage is identified. White matter changes are stable and severe. No acute cortical ischemia or infarct. The cerebellum and brainstem are normal. The basal cisterns are unremarkable. No midline shift. No enhancing metastatic disease identified. Vascular: No hyperdense vessel or unexpected calcification. Skull: Normal. Negative for fracture or focal lesion. Sinuses/Orbits: No acute finding. Other: No other abnormalities. CT CHEST FINDINGS Cardiovascular: No significant vascular findings. Normal heart size. Mediastinum/Nodes: No adenopathy is seen within the chest. The thyroid is normal in appearance. The thoracic aorta is non aneurysmal. A small amount of pericardial fluid is stable. The heart size is within normal limits. Mild coronary artery  calcifications are noted. The central great vessels otherwise normal. Lungs/Pleura: The central airways are normal. No pneumothorax. The timing of the contrast is suboptimal limiting evaluation of the patient's known lung malignancies. The 2 separate lesions in the right upper chest on the previous study are difficult to differentiate on today's study. Additionally, there is increased infiltrate surrounding the known malignancy. The subpleural nodule seen on the previous study is difficult to measure due to timing of contrast. However, it appears to be similar in size measuring 2.0 x 2.7 cm today versus 2.3 x 2.7 cm previously. The larger slightly more inferior mass is also difficult to measure but measures approximately 3.0 by 2.8 cm today versus 3.5 x 3.2 cm previously. There is increased infiltrate surrounding this mass and extending towards the suprahilar region on the right. By report, the patient's last radiation therapy to the lung was April 2017. The infiltrate in the medial right lower lobe is likely unchanged  but obscured by an enlarging right-sided pleural effusion and atelectasis. The right-sided pleural effusion is larger in the interval. There is a small left effusion now present as well. There is interlobular septal thickening in the right upper lobe which is mild but new. No other changes on the right. There is atelectasis in the left base. No suspicious nodules or masses are seen in the left lung. Musculoskeletal: The mixed lytic and sclerotic lesion with pathologic fracture in T9 is stable. A sclerotic focus the anterior aspect of C7 is stable. A sclerotic focus near the superior endplate of Z32 is stable. Sclerosis in the manubrium is unchanged. Sclerosis in the anterior eleventh seventh rib is stable. Sclerosis in the posterior left fifth rib is stable. No entirely new bony metastatic lesions are seen within the chest. CT ABDOMEN AND PELVIS FINDINGS Hepatobiliary: Evaluation of the parenchymal  organs is limited due to timing of contrast. There is a cyst in the left hepatic lobe. Another seen in the right hepatic lobe. No liver metastases are identified. The gallbladder and portal vein are unremarkable. Pancreas: No mass, inflammatory changes, or other significant abnormality. Spleen: Within normal limits in size and appearance. Adrenals/Urinary Tract: The adrenal glands are normal in appearance. No suspicious renal masses are seen. There is a nodule however posterior to the left kidney on series 2, image 59 measuring 14 by 12 mm, new in the interval. Stomach/Bowel: A PEG tube is identified. The stomach and small bowel are otherwise normal. The colon and appendix are unremarkable. The Vascular/Lymphatic: The abdominal aorta is non aneurysmal. Mild atherosclerotic change in the aorta. No adenopathy. Reproductive: No mass or other significant abnormality. Musculoskeletal: A tiny sclerotic focus in T12 is stable. Metastatic disease and L2 and L3 with endplate irregularity is stable. No disease and L4. Sclerosis in the lateral aspect of L5 is stable. 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 No new bony metastatic disease seen in the abdomen or pelvis today. Other: There is a small amount of free fluid in the pelvis which is stable. No additional suspicious abnormalities. IMPRESSION: 1. No intracranial abnormalities identified. No metastatic disease. MRI imaging would be more sensitive and specific for metastatic disease in the brain. 2. Evaluation of the lesions in the chest is limited due to poor timing of contrast. The subpleural nodule in the right upper lobe is similar to slightly smaller in the interval. The larger more inferior nodule in the right upper lobe is a little smaller in the interval. Increased opacity adjacent to these right upper lobe masses, extending centrally towards the right hilum is nonspecific and could represent an infectious or inflammatory process. Evolving  postradiation change or worsening malignancy are also possibilities. Recommend attention on follow-up. Interlobular septal thickening in the right upper lobe is mild but new and lymphangitic spread of tumor is not excluded. 3. Bony metastatic disease is essentially stable in the interval. 4. There is a new soft tissue nodule posterior to the left kidney consistent with metastatic disease. This is new in the interval. Electronically Signed   By: Dorise Bullion III M.D   On: 05/11/2016 20:23   Ct Abdomen Pelvis W Contrast  Result Date: 05/11/2016 CLINICAL DATA:  Follow-up lung cancer. EXAM: CT HEAD WITHOUT AND WITH CONTRAST CT CHEST, ABDOMEN AND PELVIS WITH CONTRAST TECHNIQUE: Contiguous axial images were obtained from the base of the skull through the vertex without and with intravenous contrast. Multidetector CT imaging of the chest, abdomen and pelvis was performed following the  standard protocol during bolus administration of intravenous contrast. CONTRAST:  148m ISOVUE-300 IOPAMIDOL (ISOVUE-300) INJECTION 61%, 379mISOVUE-300 IOPAMIDOL (ISOVUE-300) INJECTION 61%<Contrast>10078mSOVUE-300 IOPAMIDOL (ISOVUE-300) INJECTION 61%, 42m3mOVUE-300 IOPAMIDOL (ISOVUE-300) INJECTION 61% COMPARISON:  CT scan of the brain March 09, 2016, MRI brain March 10, 2016, and CT of the chest/abdomen/ pelvis January 15, 2016 FINDINGS: CT HEAD FINDINGS Brain: No subdural, epidural, or subarachnoid. High attenuation in the right basal ganglia on series 2, image 12 is consistent with calcification. No intraparenchymal hemorrhage is identified. White matter changes are stable and severe. No acute cortical ischemia or infarct. The cerebellum and brainstem are normal. The basal cisterns are unremarkable. No midline shift. No enhancing metastatic disease identified. Vascular: No hyperdense vessel or unexpected calcification. Skull: Normal. Negative for fracture or focal lesion. Sinuses/Orbits: No acute finding. Other: No other  abnormalities. CT CHEST FINDINGS Cardiovascular: No significant vascular findings. Normal heart size. Mediastinum/Nodes: No adenopathy is seen within the chest. The thyroid is normal in appearance. The thoracic aorta is non aneurysmal. A small amount of pericardial fluid is stable. The heart size is within normal limits. Mild coronary artery calcifications are noted. The central great vessels otherwise normal. Lungs/Pleura: The central airways are normal. No pneumothorax. The timing of the contrast is suboptimal limiting evaluation of the patient's known lung malignancies. The 2 separate lesions in the right upper chest on the previous study are difficult to differentiate on today's study. Additionally, there is increased infiltrate surrounding the known malignancy. The subpleural nodule seen on the previous study is difficult to measure due to timing of contrast. However, it appears to be similar in size measuring 2.0 x 2.7 cm today versus 2.3 x 2.7 cm previously. The larger slightly more inferior mass is also difficult to measure but measures approximately 3.0 by 2.8 cm today versus 3.5 x 3.2 cm previously. There is increased infiltrate surrounding this mass and extending towards the suprahilar region on the right. By report, the patient's last radiation therapy to the lung was April 2017. The infiltrate in the medial right lower lobe is likely unchanged but obscured by an enlarging right-sided pleural effusion and atelectasis. The right-sided pleural effusion is larger in the interval. There is a small left effusion now present as well. There is interlobular septal thickening in the right upper lobe which is mild but new. No other changes on the right. There is atelectasis in the left base. No suspicious nodules or masses are seen in the left lung. Musculoskeletal: The mixed lytic and sclerotic lesion with pathologic fracture in T9 is stable. A sclerotic focus the anterior aspect of C7 is stable. A sclerotic  focus near the superior endplate of T11 T24stable. Sclerosis in the manubrium is unchanged. Sclerosis in the anterior eleventh seventh rib is stable. Sclerosis in the posterior left fifth rib is stable. No entirely new bony metastatic lesions are seen within the chest. CT ABDOMEN AND PELVIS FINDINGS Hepatobiliary: Evaluation of the parenchymal organs is limited due to timing of contrast. There is a cyst in the left hepatic lobe. Another seen in the right hepatic lobe. No liver metastases are identified. The gallbladder and portal vein are unremarkable. Pancreas: No mass, inflammatory changes, or other significant abnormality. Spleen: Within normal limits in size and appearance. Adrenals/Urinary Tract: The adrenal glands are normal in appearance. No suspicious renal masses are seen. There is a nodule however posterior to the left kidney on series 2, image 59 measuring 14 by 12 mm, new in the interval. Stomach/Bowel: A PEG  tube is identified. The stomach and small bowel are otherwise normal. The colon and appendix are unremarkable. The Vascular/Lymphatic: The abdominal aorta is non aneurysmal. Mild atherosclerotic change in the aorta. No adenopathy. Reproductive: No mass or other significant abnormality. Musculoskeletal: A tiny sclerotic focus in T12 is stable. Metastatic disease and L2 and L3 with endplate irregularity is stable. No disease and L4. Sclerosis in the lateral aspect of L5 is stable. 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 No new bony metastatic disease seen in the abdomen or pelvis today. Other: There is a small amount of free fluid in the pelvis which is stable. No additional suspicious abnormalities. IMPRESSION: 1. No intracranial abnormalities identified. No metastatic disease. MRI imaging would be more sensitive and specific for metastatic disease in the brain. 2. Evaluation of the lesions in the chest is limited due to poor timing of contrast. The subpleural nodule in the  right upper lobe is similar to slightly smaller in the interval. The larger more inferior nodule in the right upper lobe is a little smaller in the interval. Increased opacity adjacent to these right upper lobe masses, extending centrally towards the right hilum is nonspecific and could represent an infectious or inflammatory process. Evolving postradiation change or worsening malignancy are also possibilities. Recommend attention on follow-up. Interlobular septal thickening in the right upper lobe is mild but new and lymphangitic spread of tumor is not excluded. 3. Bony metastatic disease is essentially stable in the interval. 4. There is a new soft tissue nodule posterior to the left kidney consistent with metastatic disease. This is new in the interval. Electronically Signed   By: Dorise Bullion III M.D   On: 05/11/2016 20:23   Dg Chest Port 1 View  Result Date: 05/11/2016 CLINICAL DATA:  Worsening weakness today. Metastatic lung carcinoma. EXAM: PORTABLE CHEST 1 VIEW COMPARISON:  Chest CT on 01/15/2016 FINDINGS: Persistent mass seen in the lateral right upper lobe, similar in appearance to recent chest CT. No new or increased areas of pulmonary opacity are seen. No evidence of pneumothorax or pleural effusion. Heart size is normal. IMPRESSION: Persistent mass in lateral right upper lobe, similar in appearance to recent chest CT. No new findings. Electronically Signed   By: Earle Gell M.D.   On: 05/11/2016 12:56        Scheduled Meds: . feeding supplement (OSMOLITE 1.5 CAL)  237 mL Per Tube TID  . ferrous [id number] C-folic acid  1 capsule Oral Daily  . folic acid  1 mg Oral Daily  . levETIRAcetam  500 mg Oral Daily  . levETIRAcetam  750 mg Oral QHS  . OLANZapine  10 mg Oral QHS   Continuous Infusions: . sodium chloride 100 mL/hr at 05/12/16 0946     LOS: 0 days    Time spent: 7 minutes    Joleigh Mineau, MD [facility] Pager [phone]  If 7PM-7AM, please  contact night-coverage [email or web address] Password [email or web address] 05/12/2016, 12:44 PM

## 2016-05-13 ENCOUNTER — Other Ambulatory Visit: Payer: Self-pay | Admitting: Medical Oncology

## 2016-05-13 ENCOUNTER — Ambulatory Visit
Admission: RE | Admit: 2016-05-13 | Discharge: 2016-05-13 | Disposition: A | Discharge status: Home / Self Care | Payer: BC Managed Care – PPO | Source: Ambulatory Visit | Attending: Radiation Oncology | Admitting: Radiation Oncology

## 2016-05-13 DIAGNOSIS — R530 Neoplastic (malignant) related fatigue: Secondary | ICD-10-CM

## 2016-05-13 DIAGNOSIS — C7951 Secondary malignant neoplasm of bone: Secondary | ICD-10-CM

## 2016-05-13 DIAGNOSIS — C7931 Secondary malignant neoplasm of brain: Principal | ICD-10-CM

## 2016-05-13 DIAGNOSIS — E860 Dehydration: Secondary | ICD-10-CM | POA: Diagnosis not present

## 2016-05-13 DIAGNOSIS — C799 Secondary malignant neoplasm of unspecified site: Secondary | ICD-10-CM

## 2016-05-13 DIAGNOSIS — Z79899 Other long term (current) drug therapy: Secondary | ICD-10-CM | POA: Insufficient documentation

## 2016-05-13 DIAGNOSIS — Z510 Encounter for antineoplastic radiation therapy: Secondary | ICD-10-CM | POA: Insufficient documentation

## 2016-05-13 DIAGNOSIS — C7902 Secondary malignant neoplasm of left kidney and renal pelvis: Secondary | ICD-10-CM

## 2016-05-13 DIAGNOSIS — Z5111 Encounter for antineoplastic chemotherapy: Secondary | ICD-10-CM

## 2016-05-13 DIAGNOSIS — C3411 Malignant neoplasm of upper lobe, right bronchus or lung: Secondary | ICD-10-CM

## 2016-05-13 DIAGNOSIS — R531 Weakness: Secondary | ICD-10-CM

## 2016-05-13 DIAGNOSIS — D6959 Other secondary thrombocytopenia: Secondary | ICD-10-CM

## 2016-05-13 LAB — CBC
HEMATOCRIT: 25.9 % — AB (ref 36.0–46.0)
HEMOGLOBIN: 8.6 g/dL — AB (ref 12.0–15.0)
MCH: 31.6 pg (ref 26.0–34.0)
MCHC: 33.2 g/dL (ref 30.0–36.0)
MCV: 95.2 fL (ref 78.0–100.0)
Platelets: 47 10*3/uL — ABNORMAL LOW (ref 150–400)
RBC: 2.72 MIL/uL — ABNORMAL LOW (ref 3.87–5.11)
RDW: 17.7 % — AB (ref 11.5–15.5)
WBC: 3.8 10*3/uL — ABNORMAL LOW (ref 4.0–10.5)

## 2016-05-13 LAB — BASIC METABOLIC PANEL
Anion gap: 7 (ref 5–15)
BUN: 16 mg/dL (ref 6–20)
CALCIUM: 7.8 mg/dL — AB (ref 8.9–10.3)
CHLORIDE: 109 mmol/L (ref 101–111)
CO2: 22 mmol/L (ref 22–32)
CREATININE: 0.63 mg/dL (ref 0.44–1.00)
GFR calc non Af Amer: >60 mL/min (ref >60)
GLUCOSE: 96 mg/dL (ref 65–99)
Potassium: 3.5 mmol/L (ref 3.5–5.1)
Sodium: 138 mmol/L (ref 135–145)

## 2016-05-13 MED ORDER — POTASSIUM CHLORIDE CRYS ER 20 MEQ PO TBCR
40.0000 meq | EXTENDED_RELEASE_TABLET | Freq: Once | ORAL | Status: AC
Start: 2016-05-13 — End: 2016-05-13
  Administered 2016-05-13: 40 meq via ORAL
  Filled 2016-05-13: qty 2

## 2016-05-13 MED ORDER — FOLIC ACID 1 MG PO TABS: 1.0000 mg | ORAL_TABLET | Freq: Every day | ORAL | 1 refills | Status: DC

## 2016-05-13 NOTE — Evaluation (Signed)
Physical Therapy Evaluation Patient Details Name: Laura Walter MRN: [id number] DOB: 05-28-1951 Today's Date: 05/13/2016   History of Present Illness  Pt admitted 2* increasing weakness with L UE and LE more affected than R.  Pt with hx of Lung CA and brain mets  Clinical Impression  Pt admitted as above and presenting with functional mobility limitations 2* generalized weakness, poor endurance and balance deficits.  Pt would benefit from follow up HHPT to further address deficits following dc to home with 24/7 assist.    Follow Up Recommendations Home health PT    Equipment Recommendations  None recommended by PT    Recommendations for Other Services OT consult     Precautions / Restrictions Precautions Precautions: Fall Restrictions Weight Bearing Restrictions: No      Mobility  Bed Mobility Overal bed mobility: Needs Assistance Bed Mobility: Supine to Sit     Supine to sit: Mod assist;+2 for safety/equipment     General bed mobility comments: cues for sequence and assist to manage LEs, bring trunk to upright and move to EOB  Transfers Overall transfer level: Needs assistance Equipment used: Rolling walker (2 wheeled) Transfers: Sit to/from Stand Sit to Stand: Min assist;Mod assist;+2 safety/equipment Stand pivot transfers: Min assist;Mod assist;+2 safety/equipment       General transfer comment: cues for safe transition position and use of UEs to self assist  Ambulation/Gait Ambulation/Gait assistance: +2 safety/equipment;Min assist;Mod assist Ambulation Distance (Feet): 90 Feet Assistive device: Rolling walker (2 wheeled) Gait Pattern/deviations: Step-through pattern;Decreased step length - right;Decreased step length - left;Shuffle;Trunk flexed;Narrow base of support Gait velocity: decr Gait velocity interpretation: Below normal speed for age/gender General Gait Details: cues for posture, position from RW and saftey awareness  Stairs             Wheelchair Mobility    Modified Rankin (Stroke Patients Only)       Balance Overall balance assessment: Needs assistance Sitting-balance support: No upper extremity supported;Feet supported Sitting balance-Leahy Scale: Fair     Standing balance support: Bilateral upper extremity supported Standing balance-Leahy Scale: Poor                               Pertinent Vitals/Pain Pain Assessment: No/denies pain Pain Score: 0-No pain    Home Living Family/patient expects to be discharged to:: Private residence Living Arrangements: Non-relatives/Friends Available Help at Discharge: Friend(s);Available 24 hours/day Type of Home: House Home Access: Stairs to enter Entrance Stairs-Rails: Right;Can reach both;Left Entrance Stairs-Number of Steps: 2 Home Layout: One level Home Equipment: Walker - 2 wheels;Bedside commode;Hospital bed      Prior Function Level of Independence: Needs assistance   Gait / Transfers Assistance Needed: Pt utilizing RW to ambulate short distances but requiring increasing levels of assist           Hand Dominance   Dominant Hand: Right    Extremity/Trunk Assessment   Upper Extremity Assessment: RUE deficits/detail;LUE deficits/detail;Generalized weakness RUE Deficits / Details: ~3+/5     LUE Deficits / Details: ~3-/5   Lower Extremity Assessment: Generalized weakness;LLE deficits/detail;RLE deficits/detail RLE Deficits / Details: ROM WFL with 3-/5 strength at hip and 3+/5 at knee and ankle LLE Deficits / Details: ROM WFL with strength at hip 2+/5, 3-/5 at knee and 3/5 at ankle  Cervical / Trunk Assessment: Kyphotic  Communication   Communication: No difficulties  Cognition Arousal/Alertness: Lethargic Behavior During Therapy: Flat affect Overall Cognitive Status: Within Functional Limits  for tasks assessed                      General Comments      Exercises     Assessment/Plan    PT Assessment Patient  needs continued PT services  PT Problem List Decreased strength;Decreased range of motion;Decreased activity tolerance;Decreased balance;Decreased mobility;Decreased knowledge of use of DME;Decreased safety awareness          PT Treatment Interventions DME instruction;Gait training;Stair training;Functional mobility training;Therapeutic activities;Therapeutic exercise;Patient/family education    PT Goals (Current goals can be found in the Care Plan section)  Acute Rehab PT Goals Patient Stated Goal: Regain strength to return home PT Goal Formulation: With patient Time For Goal Achievement: 05/25/16 Potential to Achieve Goals: Good    Frequency Min 3X/week   Barriers to discharge        Co-evaluation               End of Session Equipment Utilized During Treatment: Gait belt   Patient left: in chair;with call bell/phone within reach;with chair alarm set;with family/visitor present Nurse Communication: Mobility status    Functional Assessment Tool Used: Clinical judgement Functional Limitation: Mobility: Walking and moving around Mobility: Walking and Moving Around Current Status [phone]): At least 40 percent but less than 60 percent impaired, limited or restricted Mobility: Walking and Moving Around Goal Status [phone]): At least 20 percent but less than 40 percent impaired, limited or restricted    Time: 1040-1113 PT Time Calculation (min) (ACUTE ONLY): 33 min   Charges:   PT Evaluation $PT Eval Low Complexity: 1 Procedure PT Treatments $Gait Training: 8-22 mins   PT G Codes:   PT G-Codes **NOT FOR INPATIENT CLASS** Functional Assessment Tool Used: Clinical judgement Functional Limitation: Mobility: Walking and moving around Mobility: Walking and Moving Around Current Status (Q2300): At least 40 percent but less than 60 percent impaired, limited or restricted Mobility: Walking and Moving Around Goal Status [phone]): At least 20 percent but less than 40 percent  impaired, limited or restricted    Lagretta Loseke 05/13/2016, 12:17 PM

## 2016-05-13 NOTE — Progress Notes (Signed)
Palliative Medicine consult noted. Due to high referral volume, there may be a delay seeing this patient. Please call the Palliative Medicine Team office at [phone] if recommendations are needed in the interim.  Thank you for inviting [place] to see this patient.  Marjie Skiff Wayland Baik, RN, BSN, [facility] 05/13/2016 3:45 PM Cell [phone] 8:00-4:00 Monday-Friday Office [phone]

## 2016-05-13 NOTE — Progress Notes (Signed)
Radiation Oncology         ([phone]) [phone] ________________________________  Name: Laura Walter MRN: [id number]  Date: 05/13/16 DOB: [birth year]  VE:[id number] Birdie Riddle, MD  No ref. provider found     REFERRING PHYSICIAN: No ref. provider found   DIAGNOSIS: The primary encounter diagnosis was Weakness. Diagnoses of Dehydration, Lung cancer ([place]), and Metastatic cancer ([place]) were also pertinent to this visit.   HISTORY OF PRESENT ILLNESS: Laura Walter is a 65 y.o. female seen at the request of Dr. Julien Nordmann for a new brain metastasis. The patient is well known to our service who has had previous treatment to the brain and lung. She presented to the ED on 05/11/16 with progressive weakness and failure to thrive. She did undergo repeat imaging in the ED that did not reveal any acute findings, or new metastatic concerns. We are asked to see the patient due to the fact that there was a new brain met seen in the right cerebellum on an MRI since she has been inpatient.   PREVIOUS RADIATION THERAPY: Yes  02/15/2016 to 03/04/2016:     1. T8 - T10 was treated to 30 Gy in 10 fractions at 3 Gy per fraction. 2. The Right upper lobe 2.7 cm was treated to 30 Gy in 12 fractions at 2.5 Gy per fraction.  3. The Left frontal lobe 5.3 mm was treated to 20 Gy in 1 fraction.  4. The cervical spine was treated to 20 Gy in 4 fractions at 5 Gy per fraction.  Completed radiation 11/24/2015 to the right upper lung to 50 Gy in 8 fractions.   Completed radiation 01/25/2015 - 02/08/2015 to the spine with a 3-D conformal technique on our tomotherapy unit and with a course of whole brain radiation treatment, also delivered on our tomotherapy unit in the 3-D conformal technique mode. These treatments were delivered concurrently.   PAST MEDICAL HISTORY:  Past Medical History:  Diagnosis Date  . Allergy    seasonal  . Bone cancer ([place]) 01/10/15 PET   lytic lesion lt 1st rib,T9,L3 Lt acetabulum  . Brain cancer ([place]) 01/13/15   MRI  multiple small brain mets 10 individual lesions  . Bronchitis   . H/O reactive hypoglycemia   . IBS (irritable bowel syndrome)   . Lung cancer ([place])    stageIV nscca rul  . Osteopenia   . Paronychia 11/17/2015  . Pneumonia 2009 & 2011  . S/P radiation therapy completed 02/08/15   whole brain ,spine  . S/P radiation therapy completed 11/27/15   lung       PAST SURGICAL HISTORY: Past Surgical History:  Procedure Laterality Date  . COLONOSCOPY  2009   Negative; [facility]  . ESOPHAGOGASTRODUODENOSCOPY (EGD) WITH PROPOFOL N/A 04/11/2015   Procedure: ESOPHAGOGASTRODUODENOSCOPY (EGD) WITH PROPOFOL;  Surgeon: Carol Ada, MD;  Location: [facility];  Service: Endoscopy;  Laterality: N/A;  . PEG PLACEMENT N/A 04/11/2015   Procedure: PERCUTANEOUS ENDOSCOPIC GASTROSTOMY (PEG) PLACEMENT;  Surgeon: Carol Ada, MD;  Location: [facility];  Service: Endoscopy;  Laterality: N/A;  . TONSILLECTOMY AND ADENOIDECTOMY    . WISDOM TOOTH EXTRACTION       FAMILY HISTORY:  Family History  Problem Relation Age of Onset  . Transient ischemic attack Mother   . Mental illness Mother     Dementia  . Diabetes Father   . Heart attack Father 76  . Heart attack Maternal Grandfather     in 32s  . Stomach cancer Maternal Aunt   .  Heart attack Maternal Aunt     in 56s  . Diabetes Paternal Uncle     X 3     SOCIAL HISTORY:  reports that she has never smoked. She has never used smokeless tobacco. She reports that she drinks alcohol. She reports that she does not use drugs.   ALLERGIES: Sulfonamide derivatives   MEDICATIONS:  Current Facility-Administered Medications  Medication Dose Route Frequency Provider Last Rate Last Dose  . 0.9 %  sodium chloride infusion   Intravenous Continuous Etta Quill, DO 100 mL/hr at 05/13/16 9798    . feeding supplement (OSMOLITE 1.5 CAL) liquid 237 mL  237 mL Per Tube TID Etta Quill, DO 50 mL/hr at 05/13/16 1109 237 mL at 05/13/16 1109  . ferrous  [id number] C-folic acid (TRINSICON / FOLTRIN) capsule 1 capsule  1 capsule Oral Daily Orlie Dakin, MD   1 capsule at 05/12/16 0947  . folic acid (FOLVITE) tablet 1 mg  1 mg Oral Daily Etta Quill, DO   1 mg at 05/13/16 0947  . HYDROcodone-acetaminophen (NORCO/VICODIN) 5-325 MG per tablet 1-2 tablet  1-2 tablet Oral Q4H PRN Etta Quill, DO      . levETIRAcetam (KEPPRA) tablet 500 mg  500 mg Oral Daily Etta Quill, DO   500 mg at 05/13/16 0947  . levETIRAcetam (KEPPRA) tablet 750 mg  750 mg Oral QHS Etta Quill, DO   750 mg at 05/12/16 2241  . OLANZapine (ZYPREXA) tablet 10 mg  10 mg Oral QHS Etta Quill, DO   10 mg at 05/12/16 2240  . traMADol (ULTRAM) tablet 50 mg  50 mg Oral Q6H PRN Etta Quill, DO         REVIEW OF SYSTEMS: On review of systems, the patient reports that she is doing ok, but has been very weak, fatigued, and has been unable to do much of anything due to feeling so weak. She denies any chest pain, shortness of breath, cough, fevers, chills, night sweats, but continues to struggle with her weight, and has had trouble with eating well. She denies any bowel or bladder disturbances, and denies abdominal pain, nausea or vomiting. A complete review of systems is obtained and is otherwise negative.     PHYSICAL EXAM:  height is 5' 6" (1.676 m) and weight is 131 lb 6.3 oz (59.6 kg). Her oral temperature is 98 F (36.7 C). Her blood pressure is 139/85 and her pulse is 106 (abnormal). Her respiration is 20 and oxygen saturation is 96%.   In general this is a chronically ill, cachectic caucasian female in no acute distress. She is alert and oriented x4 and appropriate throughout the examination. Cardiopulmonary assessment is negative for acute distress and she exhibits normal effort.    ECOG = 4  0 - Asymptomatic (Fully active, able to carry on all predisease activities without restriction)  1 - Symptomatic but completely ambulatory (Restricted in  physically strenuous activity but ambulatory and able to carry out work of a light or sedentary nature. For example, light housework, office work)  2 - Symptomatic, <50% in bed during the day (Ambulatory and capable of all self care but unable to carry out any work activities. Up and about more than 50% of waking hours)  3 - Symptomatic, >50% in bed, but not bedbound (Capable of only limited self-care, confined to bed or chair 50% or more of waking hours)  4 - Bedbound (Completely disabled. Cannot carry on any self-care.  Totally confined to bed or chair)  5 - Death   Eustace Pen MM, Creech RH, Tormey DC, et al. [phone]). "Toxicity and response criteria of the [facility]". [place] Oncol. 5 (6): 649-55    LABORATORY DATA:  Lab Results  Component Value Date   WBC 3.8 (L) 05/13/2016   HGB 8.6 (L) 05/13/2016   HCT 25.9 (L) 05/13/2016   MCV 95.2 05/13/2016   PLT 47 (L) 05/13/2016   Lab Results  Component Value Date   NA 138 05/13/2016   K 3.5 05/13/2016   CL 109 05/13/2016   CO2 22 05/13/2016   Lab Results  Component Value Date   ALT 35 05/11/2016   AST 33 05/11/2016   ALKPHOS 67 05/11/2016   BILITOT 0.5 05/11/2016      RADIOGRAPHY: Ct Head W & Wo Contrast  Result Date: 05/11/2016 CLINICAL DATA:  Follow-up lung cancer. EXAM: CT HEAD WITHOUT AND WITH CONTRAST CT CHEST, ABDOMEN AND PELVIS WITH CONTRAST TECHNIQUE: Contiguous axial images were obtained from the base of the skull through the vertex without and with intravenous contrast. Multidetector CT imaging of the chest, abdomen and pelvis was performed following the standard protocol during bolus administration of intravenous contrast. CONTRAST:  149m ISOVUE-300 IOPAMIDOL (ISOVUE-300) INJECTION 61%, 321mISOVUE-300 IOPAMIDOL (ISOVUE-300) INJECTION 61%<Contrast>10082mSOVUE-300 IOPAMIDOL (ISOVUE-300) INJECTION 61%, 65m49mOVUE-300 IOPAMIDOL (ISOVUE-300) INJECTION 61% COMPARISON:  CT scan of the brain March 09, 2016, MRI brain March 10, 2016, and CT of the chest/abdomen/ pelvis January 15, 2016 FINDINGS: CT HEAD FINDINGS Brain: No subdural, epidural, or subarachnoid. High attenuation in the right basal ganglia on series 2, image 12 is consistent with calcification. No intraparenchymal hemorrhage is identified. White matter changes are stable and severe. No acute cortical ischemia or infarct. The cerebellum and brainstem are normal. The basal cisterns are unremarkable. No midline shift. No enhancing metastatic disease identified. Vascular: No hyperdense vessel or unexpected calcification. Skull: Normal. Negative for fracture or focal lesion. Sinuses/Orbits: No acute finding. Other: No other abnormalities. CT CHEST FINDINGS Cardiovascular: No significant vascular findings. Normal heart size. Mediastinum/Nodes: No adenopathy is seen within the chest. The thyroid is normal in appearance. The thoracic aorta is non aneurysmal. A small amount of pericardial fluid is stable. The heart size is within normal limits. Mild coronary artery calcifications are noted. The central great vessels otherwise normal. Lungs/Pleura: The central airways are normal. No pneumothorax. The timing of the contrast is suboptimal limiting evaluation of the patient's known lung malignancies. The 2 separate lesions in the right upper chest on the previous study are difficult to differentiate on today's study. Additionally, there is increased infiltrate surrounding the known malignancy. The subpleural nodule seen on the previous study is difficult to measure due to timing of contrast. However, it appears to be similar in size measuring 2.0 x 2.7 cm today versus 2.3 x 2.7 cm previously. The larger slightly more inferior mass is also difficult to measure but measures approximately 3.0 by 2.8 cm today versus 3.5 x 3.2 cm previously. There is increased infiltrate surrounding this mass and extending towards the suprahilar region on the right. By report, the  patient's last radiation therapy to the lung was April 2017. The infiltrate in the medial right lower lobe is likely unchanged but obscured by an enlarging right-sided pleural effusion and atelectasis. The right-sided pleural effusion is larger in the interval. There is a small left effusion now present as well. There is interlobular septal thickening in the right upper lobe  which is mild but new. No other changes on the right. There is atelectasis in the left base. No suspicious nodules or masses are seen in the left lung. Musculoskeletal: The mixed lytic and sclerotic lesion with pathologic fracture in T9 is stable. A sclerotic focus the anterior aspect of C7 is stable. A sclerotic focus near the superior endplate of T24 is stable. Sclerosis in the manubrium is unchanged. Sclerosis in the anterior eleventh seventh rib is stable. Sclerosis in the posterior left fifth rib is stable. No entirely new bony metastatic lesions are seen within the chest. CT ABDOMEN AND PELVIS FINDINGS Hepatobiliary: Evaluation of the parenchymal organs is limited due to timing of contrast. There is a cyst in the left hepatic lobe. Another seen in the right hepatic lobe. No liver metastases are identified. The gallbladder and portal vein are unremarkable. Pancreas: No mass, inflammatory changes, or other significant abnormality. Spleen: Within normal limits in size and appearance. Adrenals/Urinary Tract: The adrenal glands are normal in appearance. No suspicious renal masses are seen. There is a nodule however posterior to the left kidney on series 2, image 59 measuring 14 by 12 mm, new in the interval. Stomach/Bowel: A PEG tube is identified. The stomach and small bowel are otherwise normal. The colon and appendix are unremarkable. The Vascular/Lymphatic: The abdominal aorta is non aneurysmal. Mild atherosclerotic change in the aorta. No adenopathy. Reproductive: No mass or other significant abnormality. Musculoskeletal: A tiny sclerotic  focus in T12 is stable. Metastatic disease and L2 and L3 with endplate irregularity is stable. No disease and L4. Sclerosis in the lateral aspect of L5 is stable. 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 No new bony metastatic disease seen in the abdomen or pelvis today. Other: There is a small amount of free fluid in the pelvis which is stable. No additional suspicious abnormalities. IMPRESSION: 1. No intracranial abnormalities identified. No metastatic disease. MRI imaging would be more sensitive and specific for metastatic disease in the brain. 2. Evaluation of the lesions in the chest is limited due to poor timing of contrast. The subpleural nodule in the right upper lobe is similar to slightly smaller in the interval. The larger more inferior nodule in the right upper lobe is a little smaller in the interval. Increased opacity adjacent to these right upper lobe masses, extending centrally towards the right hilum is nonspecific and could represent an infectious or inflammatory process. Evolving postradiation change or worsening malignancy are also possibilities. Recommend attention on follow-up. Interlobular septal thickening in the right upper lobe is mild but new and lymphangitic spread of tumor is not excluded. 3. Bony metastatic disease is essentially stable in the interval. 4. There is a new soft tissue nodule posterior to the left kidney consistent with metastatic disease. This is new in the interval. Electronically Signed   By: Dorise Bullion III M.D   On: 05/11/2016 20:23   Ct Chest W Contrast  Result Date: 05/11/2016 CLINICAL DATA:  Follow-up lung cancer. EXAM: CT HEAD WITHOUT AND WITH CONTRAST CT CHEST, ABDOMEN AND PELVIS WITH CONTRAST TECHNIQUE: Contiguous axial images were obtained from the base of the skull through the vertex without and with intravenous contrast. Multidetector CT imaging of the chest, abdomen and pelvis was performed following the standard protocol  during bolus administration of intravenous contrast. CONTRAST:  115m ISOVUE-300 IOPAMIDOL (ISOVUE-300) INJECTION 61%, 346mISOVUE-300 IOPAMIDOL (ISOVUE-300) INJECTION 61%<Contrast>10064mSOVUE-300 IOPAMIDOL (ISOVUE-300) INJECTION 61%, 24m65mOVUE-300 IOPAMIDOL (ISOVUE-300) INJECTION 61% COMPARISON:  CT scan of the brain  March 09, 2016, MRI brain March 10, 2016, and CT of the chest/abdomen/ pelvis January 15, 2016 FINDINGS: CT HEAD FINDINGS Brain: No subdural, epidural, or subarachnoid. High attenuation in the right basal ganglia on series 2, image 12 is consistent with calcification. No intraparenchymal hemorrhage is identified. White matter changes are stable and severe. No acute cortical ischemia or infarct. The cerebellum and brainstem are normal. The basal cisterns are unremarkable. No midline shift. No enhancing metastatic disease identified. Vascular: No hyperdense vessel or unexpected calcification. Skull: Normal. Negative for fracture or focal lesion. Sinuses/Orbits: No acute finding. Other: No other abnormalities. CT CHEST FINDINGS Cardiovascular: No significant vascular findings. Normal heart size. Mediastinum/Nodes: No adenopathy is seen within the chest. The thyroid is normal in appearance. The thoracic aorta is non aneurysmal. A small amount of pericardial fluid is stable. The heart size is within normal limits. Mild coronary artery calcifications are noted. The central great vessels otherwise normal. Lungs/Pleura: The central airways are normal. No pneumothorax. The timing of the contrast is suboptimal limiting evaluation of the patient's known lung malignancies. The 2 separate lesions in the right upper chest on the previous study are difficult to differentiate on today's study. Additionally, there is increased infiltrate surrounding the known malignancy. The subpleural nodule seen on the previous study is difficult to measure due to timing of contrast. However, it appears to be similar in size  measuring 2.0 x 2.7 cm today versus 2.3 x 2.7 cm previously. The larger slightly more inferior mass is also difficult to measure but measures approximately 3.0 by 2.8 cm today versus 3.5 x 3.2 cm previously. There is increased infiltrate surrounding this mass and extending towards the suprahilar region on the right. By report, the patient's last radiation therapy to the lung was April 2017. The infiltrate in the medial right lower lobe is likely unchanged but obscured by an enlarging right-sided pleural effusion and atelectasis. The right-sided pleural effusion is larger in the interval. There is a small left effusion now present as well. There is interlobular septal thickening in the right upper lobe which is mild but new. No other changes on the right. There is atelectasis in the left base. No suspicious nodules or masses are seen in the left lung. Musculoskeletal: The mixed lytic and sclerotic lesion with pathologic fracture in T9 is stable. A sclerotic focus the anterior aspect of C7 is stable. A sclerotic focus near the superior endplate of G29 is stable. Sclerosis in the manubrium is unchanged. Sclerosis in the anterior eleventh seventh rib is stable. Sclerosis in the posterior left fifth rib is stable. No entirely new bony metastatic lesions are seen within the chest. CT ABDOMEN AND PELVIS FINDINGS Hepatobiliary: Evaluation of the parenchymal organs is limited due to timing of contrast. There is a cyst in the left hepatic lobe. Another seen in the right hepatic lobe. No liver metastases are identified. The gallbladder and portal vein are unremarkable. Pancreas: No mass, inflammatory changes, or other significant abnormality. Spleen: Within normal limits in size and appearance. Adrenals/Urinary Tract: The adrenal glands are normal in appearance. No suspicious renal masses are seen. There is a nodule however posterior to the left kidney on series 2, image 59 measuring 14 by 12 mm, new in the interval.  Stomach/Bowel: A PEG tube is identified. The stomach and small bowel are otherwise normal. The colon and appendix are unremarkable. The Vascular/Lymphatic: The abdominal aorta is non aneurysmal. Mild atherosclerotic change in the aorta. No adenopathy. Reproductive: No mass or other significant abnormality.  Musculoskeletal: A tiny sclerotic focus in T12 is stable. Metastatic disease and L2 and L3 with endplate irregularity is stable. No disease and L4. Sclerosis in the lateral aspect of L5 is stable. 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 No new bony metastatic disease seen in the abdomen or pelvis today. Other: There is a small amount of free fluid in the pelvis which is stable. No additional suspicious abnormalities. IMPRESSION: 1. No intracranial abnormalities identified. No metastatic disease. MRI imaging would be more sensitive and specific for metastatic disease in the brain. 2. Evaluation of the lesions in the chest is limited due to poor timing of contrast. The subpleural nodule in the right upper lobe is similar to slightly smaller in the interval. The larger more inferior nodule in the right upper lobe is a little smaller in the interval. Increased opacity adjacent to these right upper lobe masses, extending centrally towards the right hilum is nonspecific and could represent an infectious or inflammatory process. Evolving postradiation change or worsening malignancy are also possibilities. Recommend attention on follow-up. Interlobular septal thickening in the right upper lobe is mild but new and lymphangitic spread of tumor is not excluded. 3. Bony metastatic disease is essentially stable in the interval. 4. There is a new soft tissue nodule posterior to the left kidney consistent with metastatic disease. This is new in the interval. Electronically Signed   By: Dorise Bullion III M.D   On: 05/11/2016 20:23   Mr Brain W Wo Contrast  Result Date: 05/12/2016 CLINICAL DATA:   Metastatic lung cancer presenting with progressive generalized weakness. EXAM: MRI HEAD WITHOUT AND WITH CONTRAST TECHNIQUE: Multiplanar, multiecho pulse sequences of the brain and surrounding structures were obtained without and with intravenous contrast. CONTRAST:  66m MULTIHANCE GADOBENATE DIMEGLUMINE 529 MG/ML IV SOLN COMPARISON:  CT 05/11/2016. MRI 03/10/2016. Multiple previous MRIs as distant as 01/13/2015 FINDINGS: Brain: Generalized brain atrophy. No brainstem abnormality. Old small vessel infarctions in the cerebellum. Cerebral hemispheres show diffuse white matter signal consistent with radiation change. 4 mm enhancing metastasis in the left posterior frontal vertex is unchanged. There is a new 2 mm metastasis within the inferior cerebellar vermis on the right. No mass effect or edema. No evidence of acute ischemic infarction. No hemorrhage, hydrocephalus or extra-axial collection. No pituitary mass. Vascular: Major vessels at the base of the brain show flow. Skull and upper cervical spine: Negative Sinuses/Orbits: Clear/normal Other: None IMPRESSION: No acute finding by MRI. Chronic white matter hyperintensity consistent with radiation change. 4 mm enhancing metastasis in the left posterior frontal vertex region is unchanged without enlargement since 03/10/2016. Newly seen 2 mm metastasis at the inferior cerebellum on the right without mass effect or vasogenic edema. No other new lesions seen. Electronically Signed   By: MNelson ChimesM.D.   On: 05/12/2016 12:55   Ct Abdomen Pelvis W Contrast  Result Date: 05/11/2016 CLINICAL DATA:  Follow-up lung cancer. EXAM: CT HEAD WITHOUT AND WITH CONTRAST CT CHEST, ABDOMEN AND PELVIS WITH CONTRAST TECHNIQUE: Contiguous axial images were obtained from the base of the skull through the vertex without and with intravenous contrast. Multidetector CT imaging of the chest, abdomen and pelvis was performed following the standard protocol during bolus administration of  intravenous contrast. CONTRAST:  1016mISOVUE-300 IOPAMIDOL (ISOVUE-300) INJECTION 61%, 3062mSOVUE-300 IOPAMIDOL (ISOVUE-300) INJECTION 61%<Contrast>100m18mOVUE-300 IOPAMIDOL (ISOVUE-300) INJECTION 61%, 30mL34mVUE-300 IOPAMIDOL (ISOVUE-300) INJECTION 61% COMPARISON:  CT scan of the brain March 09, 2016, MRI brain March 10, 2016, and CT of  the chest/abdomen/ pelvis January 15, 2016 FINDINGS: CT HEAD FINDINGS Brain: No subdural, epidural, or subarachnoid. High attenuation in the right basal ganglia on series 2, image 12 is consistent with calcification. No intraparenchymal hemorrhage is identified. White matter changes are stable and severe. No acute cortical ischemia or infarct. The cerebellum and brainstem are normal. The basal cisterns are unremarkable. No midline shift. No enhancing metastatic disease identified. Vascular: No hyperdense vessel or unexpected calcification. Skull: Normal. Negative for fracture or focal lesion. Sinuses/Orbits: No acute finding. Other: No other abnormalities. CT CHEST FINDINGS Cardiovascular: No significant vascular findings. Normal heart size. Mediastinum/Nodes: No adenopathy is seen within the chest. The thyroid is normal in appearance. The thoracic aorta is non aneurysmal. A small amount of pericardial fluid is stable. The heart size is within normal limits. Mild coronary artery calcifications are noted. The central great vessels otherwise normal. Lungs/Pleura: The central airways are normal. No pneumothorax. The timing of the contrast is suboptimal limiting evaluation of the patient's known lung malignancies. The 2 separate lesions in the right upper chest on the previous study are difficult to differentiate on today's study. Additionally, there is increased infiltrate surrounding the known malignancy. The subpleural nodule seen on the previous study is difficult to measure due to timing of contrast. However, it appears to be similar in size measuring 2.0 x 2.7 cm today versus 2.3  x 2.7 cm previously. The larger slightly more inferior mass is also difficult to measure but measures approximately 3.0 by 2.8 cm today versus 3.5 x 3.2 cm previously. There is increased infiltrate surrounding this mass and extending towards the suprahilar region on the right. By report, the patient's last radiation therapy to the lung was April 2017. The infiltrate in the medial right lower lobe is likely unchanged but obscured by an enlarging right-sided pleural effusion and atelectasis. The right-sided pleural effusion is larger in the interval. There is a small left effusion now present as well. There is interlobular septal thickening in the right upper lobe which is mild but new. No other changes on the right. There is atelectasis in the left base. No suspicious nodules or masses are seen in the left lung. Musculoskeletal: The mixed lytic and sclerotic lesion with pathologic fracture in T9 is stable. A sclerotic focus the anterior aspect of C7 is stable. A sclerotic focus near the superior endplate of O03 is stable. Sclerosis in the manubrium is unchanged. Sclerosis in the anterior eleventh seventh rib is stable. Sclerosis in the posterior left fifth rib is stable. No entirely new bony metastatic lesions are seen within the chest. CT ABDOMEN AND PELVIS FINDINGS Hepatobiliary: Evaluation of the parenchymal organs is limited due to timing of contrast. There is a cyst in the left hepatic lobe. Another seen in the right hepatic lobe. No liver metastases are identified. The gallbladder and portal vein are unremarkable. Pancreas: No mass, inflammatory changes, or other significant abnormality. Spleen: Within normal limits in size and appearance. Adrenals/Urinary Tract: The adrenal glands are normal in appearance. No suspicious renal masses are seen. There is a nodule however posterior to the left kidney on series 2, image 59 measuring 14 by 12 mm, new in the interval. Stomach/Bowel: A PEG tube is identified. The  stomach and small bowel are otherwise normal. The colon and appendix are unremarkable. The Vascular/Lymphatic: The abdominal aorta is non aneurysmal. Mild atherosclerotic change in the aorta. No adenopathy. Reproductive: No mass or other significant abnormality. Musculoskeletal: A tiny sclerotic focus in T12 is stable. Metastatic disease  and L2 and L3 with endplate irregularity is stable. No disease and L4. Sclerosis in the lateral aspect of L5 is stable. 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 No new bony metastatic disease seen in the abdomen or pelvis today. Other: There is a small amount of free fluid in the pelvis which is stable. No additional suspicious abnormalities. IMPRESSION: 1. No intracranial abnormalities identified. No metastatic disease. MRI imaging would be more sensitive and specific for metastatic disease in the brain. 2. Evaluation of the lesions in the chest is limited due to poor timing of contrast. The subpleural nodule in the right upper lobe is similar to slightly smaller in the interval. The larger more inferior nodule in the right upper lobe is a little smaller in the interval. Increased opacity adjacent to these right upper lobe masses, extending centrally towards the right hilum is nonspecific and could represent an infectious or inflammatory process. Evolving postradiation change or worsening malignancy are also possibilities. Recommend attention on follow-up. Interlobular septal thickening in the right upper lobe is mild but new and lymphangitic spread of tumor is not excluded. 3. Bony metastatic disease is essentially stable in the interval. 4. There is a new soft tissue nodule posterior to the left kidney consistent with metastatic disease. This is new in the interval. Electronically Signed   By: Dorise Bullion III M.D   On: 05/11/2016 20:23   Dg Chest Port 1 View  Result Date: 05/11/2016 CLINICAL DATA:  Worsening weakness today. Metastatic lung  carcinoma. EXAM: PORTABLE CHEST 1 VIEW COMPARISON:  Chest CT on 01/15/2016 FINDINGS: Persistent mass seen in the lateral right upper lobe, similar in appearance to recent chest CT. No new or increased areas of pulmonary opacity are seen. No evidence of pneumothorax or pleural effusion. Heart size is normal. IMPRESSION: Persistent mass in lateral right upper lobe, similar in appearance to recent chest CT. No new findings. Electronically Signed   By: Earle Gell M.D.   On: 05/11/2016 12:56       IMPRESSION/PLAN: 1. 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 I have discussed the patient's case with Dr. Lisbeth Renshaw who has reviewed her films. At this point, the patient's symptoms do not appear to be a direct result of her new brain met, however we would recommend following up with a repeat MRI of the brain which is due in November if she continues to make clinical improvements.     Carola Rhine, PAC

## 2016-05-13 NOTE — Progress Notes (Signed)
PROGRESS NOTE    [name]  VXB:[id number] DOB: [birth year] DOA: 05/11/2016 PCP: [name], MD    Brief Narrative:   Laura Walter is a 65 y.o. female with medical history significant of metastatic lung cancer with brain met.  Patient presented to ED with progressive generalized weakness.  She had been getting progressively weaker over the past weeks-months but this has especially been severe over the past 1 day where she has become too weak to stand up.  Weakness is in both arms and both legs, no numbness, tingling, pain.  No SOB, no vomiting, no fever, has chronic cough that is unchanged.  No recent seizures.  ED Course: whole body CT scan unremarkable other than a tiny new nodule behind left kidney, some non-specific haziness next to a lung lesion.  UA negative, no SIRS.  BMP unremarkable.  CBC shows improvement of anemia post transfusion.   Assessment & Plan:   Principal Problem:   Weakness Active Problems:   Malignant neoplasm of upper lobe of right lung ([place])   Brain metastases ([place])   Metastatic cancer ([place])  #1 generalized weakness Patient with generalized weakness with left lower extremity weakness greater than right lower extremity weakness. Likely secondary to new 2 mm metastatic lesion in the cerebellum noted on MRI of the brain. Patient with no signs or symptoms of infection. CT chest abdomen and pelvis unremarkable other than a tiny new nodule behind left kidney. Continue Keppra for seizure prophylaxis. IV fluids. PT/OT. Follow.  #2 dehydration IV fluids  #3 metastatic non-small cell lung cancer CT chest, abdomen, pelvis done was unremarkable other than a tiny new nodule behind the left kidney. Patient had presented with generalized weakness and a such MRI of the brain was obtained showed a new 2 mm metastatic lesion in the cerebellum. Continue Keppra for seizure prophylaxis. Patient with poor performance score. Oncology has been consulted. Radiation oncology has  also been consulted for further evaluation. Will consult with palliative care for goals of care.   #4 pancytopenia Likely secondary to metastatic non-small cell lung cancer. Patient with no bleeding. Follow. Patient will be seen by hematology/oncology.    DVT prophylaxis: SCDs Code Status: Full Family Communication: Updated patient and significant other at bedside. Disposition Plan: Pending oncology evaluation and PT evaluation.   Consultants:   Oncology: Dr. Lorna Few 05/13/2016  Radiation oncology pending  Procedures:   MRI brain 05/12/2016  Chest x-ray 05/11/2016  CT head/CT chest/CT abdomen and pelvis 05/11/2016  Antimicrobials:   None   Subjective: Patient sleeping however easily arousable. No chest pain. No shortness of breath. Patient sitting up in chair states no significant difference with her weakness.   Objective: Vitals:   05/12/16 1500 05/12/16 2200 05/13/16 0558 05/13/16 1325  BP: 117/79 130/81 139/85 110/74  Pulse: 94 (!) 103 (!) 106 (!) 108  Resp: _0 Temp: 98.5 F (36.9 C) 98.3 F (36.8 C) 98 F (36.7 C) 98.8 F (37.1 C)  TempSrc: Oral Oral Oral Oral  SpO2: 99% 97% 96% 99%  Weight:      Height:        Intake/Output Summary (Last 24 hours) at 05/13/16 1508 Last data filed at 05/13/16 1300  Gross per 24 hour  Intake          2737.57 ml  Output              650 ml  Net          2087.57  ml   Filed Weights   05/11/16 2230  Weight: 59.6 kg (131 lb 6.3 oz)    Examination:  General exam: Appears calm and comfortable.Dry mucous membranes. Respiratory system: Clear to auscultation. Respiratory effort normal. Cardiovascular system: S1 & S2 heard, RRR. No JVD, murmurs, rubs, gallops or clicks. No pedal edema. Gastrointestinal system: Abdomen is nondistended, soft and nontender. No organomegaly or masses felt. Normal bowel sounds heard. Central nervous system: Alert and oriented. No focal neurological deficits. Extremities:  5/5 BUE strength. 2/5 LLE strength.  4/5 RLE strength. Skin: No rashes, lesions or ulcers Psychiatry: Judgement and insight appear normal. Mood & affect appropriate.     Data Reviewed: I have personally reviewed following labs and imaging studies  CBC:  Recent Labs Lab 05/08/16 1050 05/11/16 1210 05/12/16 0418 05/13/16 0340  WBC 4.7 5.1 3.8* 3.8*  NEUTROABS 3.9 4.1  --   --   HGB 7.2* 9.8* 9.0* 8.6*  HCT 21.8* 29.2* 27.0* 25.9*  MCV 95.2 93.0 95.4 95.2  PLT 52* 50* 47* 47*   Basic Metabolic Panel:  Recent Labs Lab 05/08/16 1050 05/11/16 1210 05/12/16 0418 05/13/16 0340  NA 139 138 137 138  K 4.1 3.5 3.7 3.5  CL  --  107 108 109  CO2 21* 23 21* 22  GLUCOSE 174* 135* 97 96  BUN 26.7* 27* 19 16  CREATININE 0.8 0.77 0.72 0.63  CALCIUM 9.2 8.1* 8.0* 7.8*   GFR: Estimated Creatinine Clearance: 68.3 mL/min (by C-G formula based on SCr of 0.63 mg/dL). Liver Function Tests:  Recent Labs Lab 05/08/16 1050 05/11/16 1210  AST 24 33  ALT 24 35  ALKPHOS 74 67  BILITOT 0.43 0.5  PROT 7.0 6.5  ALBUMIN 2.6* 2.7*   No results for input(s): LIPASE, AMYLASE in the last 168 hours. No results for input(s): AMMONIA in the last 168 hours. Coagulation Profile: No results for input(s): INR, PROTIME in the last 168 hours. Cardiac Enzymes: No results for input(s): CKTOTAL, CKMB, CKMBINDEX, TROPONINI in the last 168 hours. BNP (last 3 results) No results for input(s): PROBNP in the last 8760 hours. HbA1C: No results for input(s): HGBA1C in the last 72 hours. CBG: No results for input(s): GLUCAP in the last 168 hours. Lipid Profile: No results for input(s): CHOL, HDL, LDLCALC, TRIG, CHOLHDL, LDLDIRECT in the last 72 hours. Thyroid Function Tests: No results for input(s): TSH, T4TOTAL, FREET4, T3FREE, THYROIDAB in the last 72 hours. Anemia Panel: No results for input(s): VITAMINB12, FOLATE, FERRITIN, TIBC, IRON, RETICCTPCT in the last 72 hours. Sepsis Labs:  Recent  Labs Lab 05/11/16 1259 05/11/16 1557  LATICACIDVEN 1.34 0.69    Recent Results (from the past 240 hour(s))  Urine culture     Status: None   Collection Time: 05/11/16 12:15 PM  Result Value Ref Range Status   Specimen Description URINE, RANDOM  Final   Special Requests NONE  Final   Culture NO GROWTH Performed at [facility]   Final   Report Status 05/12/2016 FINAL  Final  Blood Culture (routine x 2)     Status: None (Preliminary result)   Collection Time: 05/11/16 12:39 PM  Result Value Ref Range Status   Specimen Description BLOOD LEFT ANTECUBITAL  Final   Special Requests BOTTLES DRAWN AEROBIC AND ANAEROBIC 5CC  Final   Culture   Final    NO GROWTH 2 DAYS Performed at [facility]    Report Status PENDING  Incomplete  Blood Culture (routine x 2)  Status: None (Preliminary result)   Collection Time: 05/11/16 12:47 PM  Result Value Ref Range Status   Specimen Description BLOOD LEFT HAND  Final   Special Requests IN PEDIATRIC BOTTLE 2CC  Final   Culture   Final    NO GROWTH 2 DAYS Performed at [facility]    Report Status PENDING  Incomplete         Radiology Studies: Ct Head W & Wo Contrast  Result Date: 05/11/2016 CLINICAL DATA:  Follow-up lung cancer. EXAM: CT HEAD WITHOUT AND WITH CONTRAST CT CHEST, ABDOMEN AND PELVIS WITH CONTRAST TECHNIQUE: Contiguous axial images were obtained from the base of the skull through the vertex without and with intravenous contrast. Multidetector CT imaging of the chest, abdomen and pelvis was performed following the standard protocol during bolus administration of intravenous contrast. CONTRAST:  141m ISOVUE-300 IOPAMIDOL (ISOVUE-300) INJECTION 61%, 316mISOVUE-300 IOPAMIDOL (ISOVUE-300) INJECTION 61%<Contrast>10060mSOVUE-300 IOPAMIDOL (ISOVUE-300) INJECTION 61%, 39m55mOVUE-300 IOPAMIDOL (ISOVUE-300) INJECTION 61% COMPARISON:  CT scan of the brain March 09, 2016, MRI brain March 10, 2016, and CT of the  chest/abdomen/ pelvis January 15, 2016 FINDINGS: CT HEAD FINDINGS Brain: No subdural, epidural, or subarachnoid. High attenuation in the right basal ganglia on series 2, image 12 is consistent with calcification. No intraparenchymal hemorrhage is identified. White matter changes are stable and severe. No acute cortical ischemia or infarct. The cerebellum and brainstem are normal. The basal cisterns are unremarkable. No midline shift. No enhancing metastatic disease identified. Vascular: No hyperdense vessel or unexpected calcification. Skull: Normal. Negative for fracture or focal lesion. Sinuses/Orbits: No acute finding. Other: No other abnormalities. CT CHEST FINDINGS Cardiovascular: No significant vascular findings. Normal heart size. Mediastinum/Nodes: No adenopathy is seen within the chest. The thyroid is normal in appearance. The thoracic aorta is non aneurysmal. A small amount of pericardial fluid is stable. The heart size is within normal limits. Mild coronary artery calcifications are noted. The central great vessels otherwise normal. Lungs/Pleura: The central airways are normal. No pneumothorax. The timing of the contrast is suboptimal limiting evaluation of the patient's known lung malignancies. The 2 separate lesions in the right upper chest on the previous study are difficult to differentiate on today's study. Additionally, there is increased infiltrate surrounding the known malignancy. The subpleural nodule seen on the previous study is difficult to measure due to timing of contrast. However, it appears to be similar in size measuring 2.0 x 2.7 cm today versus 2.3 x 2.7 cm previously. The larger slightly more inferior mass is also difficult to measure but measures approximately 3.0 by 2.8 cm today versus 3.5 x 3.2 cm previously. There is increased infiltrate surrounding this mass and extending towards the suprahilar region on the right. By report, the patient's last radiation therapy to the lung was April  2017. The infiltrate in the medial right lower lobe is likely unchanged but obscured by an enlarging right-sided pleural effusion and atelectasis. The right-sided pleural effusion is larger in the interval. There is a small left effusion now present as well. There is interlobular septal thickening in the right upper lobe which is mild but new. No other changes on the right. There is atelectasis in the left base. No suspicious nodules or masses are seen in the left lung. Musculoskeletal: The mixed lytic and sclerotic lesion with pathologic fracture in T9 is stable. A sclerotic focus the anterior aspect of C7 is stable. A sclerotic focus near the superior endplate of T11 K53stable. Sclerosis in the manubrium is  unchanged. Sclerosis in the anterior eleventh seventh rib is stable. Sclerosis in the posterior left fifth rib is stable. No entirely new bony metastatic lesions are seen within the chest. CT ABDOMEN AND PELVIS FINDINGS Hepatobiliary: Evaluation of the parenchymal organs is limited due to timing of contrast. There is a cyst in the left hepatic lobe. Another seen in the right hepatic lobe. No liver metastases are identified. The gallbladder and portal vein are unremarkable. Pancreas: No mass, inflammatory changes, or other significant abnormality. Spleen: Within normal limits in size and appearance. Adrenals/Urinary Tract: The adrenal glands are normal in appearance. No suspicious renal masses are seen. There is a nodule however posterior to the left kidney on series 2, image 59 measuring 14 by 12 mm, new in the interval. Stomach/Bowel: A PEG tube is identified. The stomach and small bowel are otherwise normal. The colon and appendix are unremarkable. The Vascular/Lymphatic: The abdominal aorta is non aneurysmal. Mild atherosclerotic change in the aorta. No adenopathy. Reproductive: No mass or other significant abnormality. Musculoskeletal: A tiny sclerotic focus in T12 is stable. Metastatic disease and L2 and  L3 with endplate irregularity is stable. No disease and L4. Sclerosis in the lateral aspect of L5 is stable. 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 No new bony metastatic disease seen in the abdomen or pelvis today. Other: There is a small amount of free fluid in the pelvis which is stable. No additional suspicious abnormalities. IMPRESSION: 1. No intracranial abnormalities identified. No metastatic disease. MRI imaging would be more sensitive and specific for metastatic disease in the brain. 2. Evaluation of the lesions in the chest is limited due to poor timing of contrast. The subpleural nodule in the right upper lobe is similar to slightly smaller in the interval. The larger more inferior nodule in the right upper lobe is a little smaller in the interval. Increased opacity adjacent to these right upper lobe masses, extending centrally towards the right hilum is nonspecific and could represent an infectious or inflammatory process. Evolving postradiation change or worsening malignancy are also possibilities. Recommend attention on follow-up. Interlobular septal thickening in the right upper lobe is mild but new and lymphangitic spread of tumor is not excluded. 3. Bony metastatic disease is essentially stable in the interval. 4. There is a new soft tissue nodule posterior to the left kidney consistent with metastatic disease. This is new in the interval. Electronically Signed   By: Dorise Bullion III M.D   On: 05/11/2016 20:23   Ct Chest W Contrast  Result Date: 05/11/2016 CLINICAL DATA:  Follow-up lung cancer. EXAM: CT HEAD WITHOUT AND WITH CONTRAST CT CHEST, ABDOMEN AND PELVIS WITH CONTRAST TECHNIQUE: Contiguous axial images were obtained from the base of the skull through the vertex without and with intravenous contrast. Multidetector CT imaging of the chest, abdomen and pelvis was performed following the standard protocol during bolus administration of intravenous contrast.  CONTRAST:  13m ISOVUE-300 IOPAMIDOL (ISOVUE-300) INJECTION 61%, 350mISOVUE-300 IOPAMIDOL (ISOVUE-300) INJECTION 61%<Contrast>10023mSOVUE-300 IOPAMIDOL (ISOVUE-300) INJECTION 61%, 50m37mOVUE-300 IOPAMIDOL (ISOVUE-300) INJECTION 61% COMPARISON:  CT scan of the brain March 09, 2016, MRI brain March 10, 2016, and CT of the chest/abdomen/ pelvis January 15, 2016 FINDINGS: CT HEAD FINDINGS Brain: No subdural, epidural, or subarachnoid. High attenuation in the right basal ganglia on series 2, image 12 is consistent with calcification. No intraparenchymal hemorrhage is identified. White matter changes are stable and severe. No acute cortical ischemia or infarct. The cerebellum and brainstem are normal. The basal  cisterns are unremarkable. No midline shift. No enhancing metastatic disease identified. Vascular: No hyperdense vessel or unexpected calcification. Skull: Normal. Negative for fracture or focal lesion. Sinuses/Orbits: No acute finding. Other: No other abnormalities. CT CHEST FINDINGS Cardiovascular: No significant vascular findings. Normal heart size. Mediastinum/Nodes: No adenopathy is seen within the chest. The thyroid is normal in appearance. The thoracic aorta is non aneurysmal. A small amount of pericardial fluid is stable. The heart size is within normal limits. Mild coronary artery calcifications are noted. The central great vessels otherwise normal. Lungs/Pleura: The central airways are normal. No pneumothorax. The timing of the contrast is suboptimal limiting evaluation of the patient's known lung malignancies. The 2 separate lesions in the right upper chest on the previous study are difficult to differentiate on today's study. Additionally, there is increased infiltrate surrounding the known malignancy. The subpleural nodule seen on the previous study is difficult to measure due to timing of contrast. However, it appears to be similar in size measuring 2.0 x 2.7 cm today versus 2.3 x 2.7 cm previously.  The larger slightly more inferior mass is also difficult to measure but measures approximately 3.0 by 2.8 cm today versus 3.5 x 3.2 cm previously. There is increased infiltrate surrounding this mass and extending towards the suprahilar region on the right. By report, the patient's last radiation therapy to the lung was April 2017. The infiltrate in the medial right lower lobe is likely unchanged but obscured by an enlarging right-sided pleural effusion and atelectasis. The right-sided pleural effusion is larger in the interval. There is a small left effusion now present as well. There is interlobular septal thickening in the right upper lobe which is mild but new. No other changes on the right. There is atelectasis in the left base. No suspicious nodules or masses are seen in the left lung. Musculoskeletal: The mixed lytic and sclerotic lesion with pathologic fracture in T9 is stable. A sclerotic focus the anterior aspect of C7 is stable. A sclerotic focus near the superior endplate of O15 is stable. Sclerosis in the manubrium is unchanged. Sclerosis in the anterior eleventh seventh rib is stable. Sclerosis in the posterior left fifth rib is stable. No entirely new bony metastatic lesions are seen within the chest. CT ABDOMEN AND PELVIS FINDINGS Hepatobiliary: Evaluation of the parenchymal organs is limited due to timing of contrast. There is a cyst in the left hepatic lobe. Another seen in the right hepatic lobe. No liver metastases are identified. The gallbladder and portal vein are unremarkable. Pancreas: No mass, inflammatory changes, or other significant abnormality. Spleen: Within normal limits in size and appearance. Adrenals/Urinary Tract: The adrenal glands are normal in appearance. No suspicious renal masses are seen. There is a nodule however posterior to the left kidney on series 2, image 59 measuring 14 by 12 mm, new in the interval. Stomach/Bowel: A PEG tube is identified. The stomach and small bowel  are otherwise normal. The colon and appendix are unremarkable. The Vascular/Lymphatic: The abdominal aorta is non aneurysmal. Mild atherosclerotic change in the aorta. No adenopathy. Reproductive: No mass or other significant abnormality. Musculoskeletal: A tiny sclerotic focus in T12 is stable. Metastatic disease and L2 and L3 with endplate irregularity is stable. No disease and L4. Sclerosis in the lateral aspect of L5 is stable. 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 No new bony metastatic disease seen in the abdomen or pelvis today. Other: There is a small amount of free fluid in the pelvis which is stable.  No additional suspicious abnormalities. IMPRESSION: 1. No intracranial abnormalities identified. No metastatic disease. MRI imaging would be more sensitive and specific for metastatic disease in the brain. 2. Evaluation of the lesions in the chest is limited due to poor timing of contrast. The subpleural nodule in the right upper lobe is similar to slightly smaller in the interval. The larger more inferior nodule in the right upper lobe is a little smaller in the interval. Increased opacity adjacent to these right upper lobe masses, extending centrally towards the right hilum is nonspecific and could represent an infectious or inflammatory process. Evolving postradiation change or worsening malignancy are also possibilities. Recommend attention on follow-up. Interlobular septal thickening in the right upper lobe is mild but new and lymphangitic spread of tumor is not excluded. 3. Bony metastatic disease is essentially stable in the interval. 4. There is a new soft tissue nodule posterior to the left kidney consistent with metastatic disease. This is new in the interval. Electronically Signed   By: Dorise Bullion III M.D   On: 05/11/2016 20:23   Mr Brain W Wo Contrast  Result Date: 05/12/2016 CLINICAL DATA:  Metastatic lung cancer presenting with progressive generalized  weakness. EXAM: MRI HEAD WITHOUT AND WITH CONTRAST TECHNIQUE: Multiplanar, multiecho pulse sequences of the brain and surrounding structures were obtained without and with intravenous contrast. CONTRAST:  3m MULTIHANCE GADOBENATE DIMEGLUMINE 529 MG/ML IV SOLN COMPARISON:  CT 05/11/2016. MRI 03/10/2016. Multiple previous MRIs as distant as 01/13/2015 FINDINGS: Brain: Generalized brain atrophy. No brainstem abnormality. Old small vessel infarctions in the cerebellum. Cerebral hemispheres show diffuse white matter signal consistent with radiation change. 4 mm enhancing metastasis in the left posterior frontal vertex is unchanged. There is a new 2 mm metastasis within the inferior cerebellar vermis on the right. No mass effect or edema. No evidence of acute ischemic infarction. No hemorrhage, hydrocephalus or extra-axial collection. No pituitary mass. Vascular: Major vessels at the base of the brain show flow. Skull and upper cervical spine: Negative Sinuses/Orbits: Clear/normal Other: None IMPRESSION: No acute finding by MRI. Chronic white matter hyperintensity consistent with radiation change. 4 mm enhancing metastasis in the left posterior frontal vertex region is unchanged without enlargement since 03/10/2016. Newly seen 2 mm metastasis at the inferior cerebellum on the right without mass effect or vasogenic edema. No other new lesions seen. Electronically Signed   By: MNelson ChimesM.D.   On: 05/12/2016 12:55   Ct Abdomen Pelvis W Contrast  Result Date: 05/11/2016 CLINICAL DATA:  Follow-up lung cancer. EXAM: CT HEAD WITHOUT AND WITH CONTRAST CT CHEST, ABDOMEN AND PELVIS WITH CONTRAST TECHNIQUE: Contiguous axial images were obtained from the base of the skull through the vertex without and with intravenous contrast. Multidetector CT imaging of the chest, abdomen and pelvis was performed following the standard protocol during bolus administration of intravenous contrast. CONTRAST:  1073mISOVUE-300 IOPAMIDOL  (ISOVUE-300) INJECTION 61%, 3038mSOVUE-300 IOPAMIDOL (ISOVUE-300) INJECTION 61%<Contrast>100m48mOVUE-300 IOPAMIDOL (ISOVUE-300) INJECTION 61%, 30mL32mVUE-300 IOPAMIDOL (ISOVUE-300) INJECTION 61% COMPARISON:  CT scan of the brain March 09, 2016, MRI brain March 10, 2016, and CT of the chest/abdomen/ pelvis January 15, 2016 FINDINGS: CT HEAD FINDINGS Brain: No subdural, epidural, or subarachnoid. High attenuation in the right basal ganglia on series 2, image 12 is consistent with calcification. No intraparenchymal hemorrhage is identified. White matter changes are stable and severe. No acute cortical ischemia or infarct. The cerebellum and brainstem are normal. The basal cisterns are unremarkable. No midline shift. No enhancing metastatic disease identified.  Vascular: No hyperdense vessel or unexpected calcification. Skull: Normal. Negative for fracture or focal lesion. Sinuses/Orbits: No acute finding. Other: No other abnormalities. CT CHEST FINDINGS Cardiovascular: No significant vascular findings. Normal heart size. Mediastinum/Nodes: No adenopathy is seen within the chest. The thyroid is normal in appearance. The thoracic aorta is non aneurysmal. A small amount of pericardial fluid is stable. The heart size is within normal limits. Mild coronary artery calcifications are noted. The central great vessels otherwise normal. Lungs/Pleura: The central airways are normal. No pneumothorax. The timing of the contrast is suboptimal limiting evaluation of the patient's known lung malignancies. The 2 separate lesions in the right upper chest on the previous study are difficult to differentiate on today's study. Additionally, there is increased infiltrate surrounding the known malignancy. The subpleural nodule seen on the previous study is difficult to measure due to timing of contrast. However, it appears to be similar in size measuring 2.0 x 2.7 cm today versus 2.3 x 2.7 cm previously. The larger slightly more inferior mass  is also difficult to measure but measures approximately 3.0 by 2.8 cm today versus 3.5 x 3.2 cm previously. There is increased infiltrate surrounding this mass and extending towards the suprahilar region on the right. By report, the patient's last radiation therapy to the lung was April 2017. The infiltrate in the medial right lower lobe is likely unchanged but obscured by an enlarging right-sided pleural effusion and atelectasis. The right-sided pleural effusion is larger in the interval. There is a small left effusion now present as well. There is interlobular septal thickening in the right upper lobe which is mild but new. No other changes on the right. There is atelectasis in the left base. No suspicious nodules or masses are seen in the left lung. Musculoskeletal: The mixed lytic and sclerotic lesion with pathologic fracture in T9 is stable. A sclerotic focus the anterior aspect of C7 is stable. A sclerotic focus near the superior endplate of D63 is stable. Sclerosis in the manubrium is unchanged. Sclerosis in the anterior eleventh seventh rib is stable. Sclerosis in the posterior left fifth rib is stable. No entirely new bony metastatic lesions are seen within the chest. CT ABDOMEN AND PELVIS FINDINGS Hepatobiliary: Evaluation of the parenchymal organs is limited due to timing of contrast. There is a cyst in the left hepatic lobe. Another seen in the right hepatic lobe. No liver metastases are identified. The gallbladder and portal vein are unremarkable. Pancreas: No mass, inflammatory changes, or other significant abnormality. Spleen: Within normal limits in size and appearance. Adrenals/Urinary Tract: The adrenal glands are normal in appearance. No suspicious renal masses are seen. There is a nodule however posterior to the left kidney on series 2, image 59 measuring 14 by 12 mm, new in the interval. Stomach/Bowel: A PEG tube is identified. The stomach and small bowel are otherwise normal. The colon and  appendix are unremarkable. The Vascular/Lymphatic: The abdominal aorta is non aneurysmal. Mild atherosclerotic change in the aorta. No adenopathy. Reproductive: No mass or other significant abnormality. Musculoskeletal: A tiny sclerotic focus in T12 is stable. Metastatic disease and L2 and L3 with endplate irregularity is stable. No disease and L4. Sclerosis in the lateral aspect of L5 is stable. 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 No new bony metastatic disease seen in the abdomen or pelvis today. Other: There is a small amount of free fluid in the pelvis which is stable. No additional suspicious abnormalities. IMPRESSION: 1. No intracranial abnormalities identified. No  metastatic disease. MRI imaging would be more sensitive and specific for metastatic disease in the brain. 2. Evaluation of the lesions in the chest is limited due to poor timing of contrast. The subpleural nodule in the right upper lobe is similar to slightly smaller in the interval. The larger more inferior nodule in the right upper lobe is a little smaller in the interval. Increased opacity adjacent to these right upper lobe masses, extending centrally towards the right hilum is nonspecific and could represent an infectious or inflammatory process. Evolving postradiation change or worsening malignancy are also possibilities. Recommend attention on follow-up. Interlobular septal thickening in the right upper lobe is mild but new and lymphangitic spread of tumor is not excluded. 3. Bony metastatic disease is essentially stable in the interval. 4. There is a new soft tissue nodule posterior to the left kidney consistent with metastatic disease. This is new in the interval. Electronically Signed   By: Dorise Bullion III M.D   On: 05/11/2016 20:23        Scheduled Meds: . feeding supplement (OSMOLITE 1.5 CAL)  237 mL Per Tube TID  . ferrous [id number] C-folic acid  1 capsule Oral Daily  . folic acid  1  mg Oral Daily  . levETIRAcetam  500 mg Oral Daily  . levETIRAcetam  750 mg Oral QHS  . OLANZapine  10 mg Oral QHS   Continuous Infusions: . sodium chloride 100 mL/hr at 05/13/16 0711     LOS: 0 days    Time spent: 73 minutes    ,, MD [facility] Pager [phone]  If 7PM-7AM, please contact night-coverage [email or web address] Password [email or web address] 05/13/2016, 3:08 PM

## 2016-05-13 NOTE — Progress Notes (Signed)
Subjective: The patient is seen and examined today. Her partner and another friend were at the bedside. She was admitted to [facility] with significant fatigue and weakness. She is a very pleasant 65 years old with a stage IV non-small cell lung cancer, adenocarcinoma with positive EGFR mutation diagnosed in June 2016. She underwent several treatment regimens including oral EGFR tyrosine kinase inhibitor as well as palliative radiation. She is currently on systemic chemotherapy with carboplatin, Alimta and Ketruda (pembrolizumab) status post 2 cycles. Her treatment is currently on hold secondary to significant thrombocytopenia and weakness. She is feeling much better today and was eating her breakfast. She continues to have generalized fatigue. She had repeat imaging studies on admission including MRI of the brain that showed new tiny 2 mm metastasis in the inferior cerebellum on the right without mass effect or vasogenic edema. CT scan of the chest showed stable bony metastasis in addition to smaller right upper lobe subpleural nodule. There was also a new soft tissue nodule posterior to the left kidney consistent with metastatic disease.  Objective: Vital signs in last 24 hours: Temp:  [98 F (36.7 C)-98.5 F (36.9 C)] 98 F (36.7 C) (10/09 0558) Pulse Rate:  [94-106] 106 (10/09 0558) Resp:  [20] 20 (10/09 0558) BP: (117-139)/(79-85) 139/85 (10/09 0558) SpO2:  [96 %-99 %] 96 % (10/09 0558)  Intake/Output from previous day: 10/08 0701 - 10/09 0700 In: 2737.6 [I.V.:2722.6; NG/GT:15] Out: 500 [Urine:500] Intake/Output this shift: No intake/output data recorded.  General appearance: alert, cooperative, fatigued and no distress Resp: clear to auscultation bilaterally Cardio: regular rate and rhythm, S1, S2 normal, no murmur, click, rub or gallop GI: soft, non-tender; bowel sounds normal; no masses,  no organomegaly Extremities: extremities normal, atraumatic, no cyanosis or  edema  Lab Results:   Recent Labs  05/12/16 0418 05/13/16 0340  WBC 3.8* 3.8*  HGB 9.0* 8.6*  HCT 27.0* 25.9*  PLT 47* 47*   BMET  Recent Labs  05/12/16 0418 05/13/16 0340  NA 137 138  K 3.7 3.5  CL 108 109  CO2 21* 22  GLUCOSE 97 96  BUN 19 16  CREATININE 0.72 0.63  CALCIUM 8.0* 7.8*    Studies/Results: Ct Head W & Wo Contrast  Result Date: 05/11/2016 CLINICAL DATA:  Follow-up lung cancer. EXAM: CT HEAD WITHOUT AND WITH CONTRAST CT CHEST, ABDOMEN AND PELVIS WITH CONTRAST TECHNIQUE: Contiguous axial images were obtained from the base of the skull through the vertex without and with intravenous contrast. Multidetector CT imaging of the chest, abdomen and pelvis was performed following the standard protocol during bolus administration of intravenous contrast. CONTRAST:  145m ISOVUE-300 IOPAMIDOL (ISOVUE-300) INJECTION 61%, 327mISOVUE-300 IOPAMIDOL (ISOVUE-300) INJECTION 61%<Contrast>10064mSOVUE-300 IOPAMIDOL (ISOVUE-300) INJECTION 61%, 91m49mOVUE-300 IOPAMIDOL (ISOVUE-300) INJECTION 61% COMPARISON:  CT scan of the brain March 09, 2016, MRI brain March 10, 2016, and CT of the chest/abdomen/ pelvis January 15, 2016 FINDINGS: CT HEAD FINDINGS Brain: No subdural, epidural, or subarachnoid. High attenuation in the right basal ganglia on series 2, image 12 is consistent with calcification. No intraparenchymal hemorrhage is identified. White matter changes are stable and severe. No acute cortical ischemia or infarct. The cerebellum and brainstem are normal. The basal cisterns are unremarkable. No midline shift. No enhancing metastatic disease identified. Vascular: No hyperdense vessel or unexpected calcification. Skull: Normal. Negative for fracture or focal lesion. Sinuses/Orbits: No acute finding. Other: No other abnormalities. CT CHEST FINDINGS Cardiovascular: No significant vascular findings. Normal heart size. Mediastinum/Nodes: No adenopathy is  seen within the chest. The thyroid is  normal in appearance. The thoracic aorta is non aneurysmal. A small amount of pericardial fluid is stable. The heart size is within normal limits. Mild coronary artery calcifications are noted. The central great vessels otherwise normal. Lungs/Pleura: The central airways are normal. No pneumothorax. The timing of the contrast is suboptimal limiting evaluation of the patient's known lung malignancies. The 2 separate lesions in the right upper chest on the previous study are difficult to differentiate on today's study. Additionally, there is increased infiltrate surrounding the known malignancy. The subpleural nodule seen on the previous study is difficult to measure due to timing of contrast. However, it appears to be similar in size measuring 2.0 x 2.7 cm today versus 2.3 x 2.7 cm previously. The larger slightly more inferior mass is also difficult to measure but measures approximately 3.0 by 2.8 cm today versus 3.5 x 3.2 cm previously. There is increased infiltrate surrounding this mass and extending towards the suprahilar region on the right. By report, the patient's last radiation therapy to the lung was April 2017. The infiltrate in the medial right lower lobe is likely unchanged but obscured by an enlarging right-sided pleural effusion and atelectasis. The right-sided pleural effusion is larger in the interval. There is a small left effusion now present as well. There is interlobular septal thickening in the right upper lobe which is mild but new. No other changes on the right. There is atelectasis in the left base. No suspicious nodules or masses are seen in the left lung. Musculoskeletal: The mixed lytic and sclerotic lesion with pathologic fracture in T9 is stable. A sclerotic focus the anterior aspect of C7 is stable. A sclerotic focus near the superior endplate of P82 is stable. Sclerosis in the manubrium is unchanged. Sclerosis in the anterior eleventh seventh rib is stable. Sclerosis in the posterior  left fifth rib is stable. No entirely new bony metastatic lesions are seen within the chest. CT ABDOMEN AND PELVIS FINDINGS Hepatobiliary: Evaluation of the parenchymal organs is limited due to timing of contrast. There is a cyst in the left hepatic lobe. Another seen in the right hepatic lobe. No liver metastases are identified. The gallbladder and portal vein are unremarkable. Pancreas: No mass, inflammatory changes, or other significant abnormality. Spleen: Within normal limits in size and appearance. Adrenals/Urinary Tract: The adrenal glands are normal in appearance. No suspicious renal masses are seen. There is a nodule however posterior to the left kidney on series 2, image 59 measuring 14 by 12 mm, new in the interval. Stomach/Bowel: A PEG tube is identified. The stomach and small bowel are otherwise normal. The colon and appendix are unremarkable. The Vascular/Lymphatic: The abdominal aorta is non aneurysmal. Mild atherosclerotic change in the aorta. No adenopathy. Reproductive: No mass or other significant abnormality. Musculoskeletal: A tiny sclerotic focus in T12 is stable. Metastatic disease and L2 and L3 with endplate irregularity is stable. No disease and L4. Sclerosis in the lateral aspect of L5 is stable. 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 No new bony metastatic disease seen in the abdomen or pelvis today. Other: There is a small amount of free fluid in the pelvis which is stable. No additional suspicious abnormalities. IMPRESSION: 1. No intracranial abnormalities identified. No metastatic disease. MRI imaging would be more sensitive and specific for metastatic disease in the brain. 2. Evaluation of the lesions in the chest is limited due to poor timing of contrast. The subpleural nodule in the right  upper lobe is similar to slightly smaller in the interval. The larger more inferior nodule in the right upper lobe is a little smaller in the interval. Increased opacity  adjacent to these right upper lobe masses, extending centrally towards the right hilum is nonspecific and could represent an infectious or inflammatory process. Evolving postradiation change or worsening malignancy are also possibilities. Recommend attention on follow-up. Interlobular septal thickening in the right upper lobe is mild but new and lymphangitic spread of tumor is not excluded. 3. Bony metastatic disease is essentially stable in the interval. 4. There is a new soft tissue nodule posterior to the left kidney consistent with metastatic disease. This is new in the interval. Electronically Signed   By: Dorise Bullion III M.D   On: 05/11/2016 20:23   Ct Chest W Contrast  Result Date: 05/11/2016 CLINICAL DATA:  Follow-up lung cancer. EXAM: CT HEAD WITHOUT AND WITH CONTRAST CT CHEST, ABDOMEN AND PELVIS WITH CONTRAST TECHNIQUE: Contiguous axial images were obtained from the base of the skull through the vertex without and with intravenous contrast. Multidetector CT imaging of the chest, abdomen and pelvis was performed following the standard protocol during bolus administration of intravenous contrast. CONTRAST:  171m ISOVUE-300 IOPAMIDOL (ISOVUE-300) INJECTION 61%, 331mISOVUE-300 IOPAMIDOL (ISOVUE-300) INJECTION 61%<Contrast>10051mSOVUE-300 IOPAMIDOL (ISOVUE-300) INJECTION 61%, 109m41mOVUE-300 IOPAMIDOL (ISOVUE-300) INJECTION 61% COMPARISON:  CT scan of the brain March 09, 2016, MRI brain March 10, 2016, and CT of the chest/abdomen/ pelvis January 15, 2016 FINDINGS: CT HEAD FINDINGS Brain: No subdural, epidural, or subarachnoid. High attenuation in the right basal ganglia on series 2, image 12 is consistent with calcification. No intraparenchymal hemorrhage is identified. White matter changes are stable and severe. No acute cortical ischemia or infarct. The cerebellum and brainstem are normal. The basal cisterns are unremarkable. No midline shift. No enhancing metastatic disease identified. Vascular: No  hyperdense vessel or unexpected calcification. Skull: Normal. Negative for fracture or focal lesion. Sinuses/Orbits: No acute finding. Other: No other abnormalities. CT CHEST FINDINGS Cardiovascular: No significant vascular findings. Normal heart size. Mediastinum/Nodes: No adenopathy is seen within the chest. The thyroid is normal in appearance. The thoracic aorta is non aneurysmal. A small amount of pericardial fluid is stable. The heart size is within normal limits. Mild coronary artery calcifications are noted. The central great vessels otherwise normal. Lungs/Pleura: The central airways are normal. No pneumothorax. The timing of the contrast is suboptimal limiting evaluation of the patient's known lung malignancies. The 2 separate lesions in the right upper chest on the previous study are difficult to differentiate on today's study. Additionally, there is increased infiltrate surrounding the known malignancy. The subpleural nodule seen on the previous study is difficult to measure due to timing of contrast. However, it appears to be similar in size measuring 2.0 x 2.7 cm today versus 2.3 x 2.7 cm previously. The larger slightly more inferior mass is also difficult to measure but measures approximately 3.0 by 2.8 cm today versus 3.5 x 3.2 cm previously. There is increased infiltrate surrounding this mass and extending towards the suprahilar region on the right. By report, the patient's last radiation therapy to the lung was April 2017. The infiltrate in the medial right lower lobe is likely unchanged but obscured by an enlarging right-sided pleural effusion and atelectasis. The right-sided pleural effusion is larger in the interval. There is a small left effusion now present as well. There is interlobular septal thickening in the right upper lobe which is mild but new. No other changes  on the right. There is atelectasis in the left base. No suspicious nodules or masses are seen in the left lung. Musculoskeletal:  The mixed lytic and sclerotic lesion with pathologic fracture in T9 is stable. A sclerotic focus the anterior aspect of C7 is stable. A sclerotic focus near the superior endplate of K74 is stable. Sclerosis in the manubrium is unchanged. Sclerosis in the anterior eleventh seventh rib is stable. Sclerosis in the posterior left fifth rib is stable. No entirely new bony metastatic lesions are seen within the chest. CT ABDOMEN AND PELVIS FINDINGS Hepatobiliary: Evaluation of the parenchymal organs is limited due to timing of contrast. There is a cyst in the left hepatic lobe. Another seen in the right hepatic lobe. No liver metastases are identified. The gallbladder and portal vein are unremarkable. Pancreas: No mass, inflammatory changes, or other significant abnormality. Spleen: Within normal limits in size and appearance. Adrenals/Urinary Tract: The adrenal glands are normal in appearance. No suspicious renal masses are seen. There is a nodule however posterior to the left kidney on series 2, image 59 measuring 14 by 12 mm, new in the interval. Stomach/Bowel: A PEG tube is identified. The stomach and small bowel are otherwise normal. The colon and appendix are unremarkable. The Vascular/Lymphatic: The abdominal aorta is non aneurysmal. Mild atherosclerotic change in the aorta. No adenopathy. Reproductive: No mass or other significant abnormality. Musculoskeletal: A tiny sclerotic focus in T12 is stable. Metastatic disease and L2 and L3 with endplate irregularity is stable. No disease and L4. Sclerosis in the lateral aspect of L5 is stable. 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 No new bony metastatic disease seen in the abdomen or pelvis today. Other: There is a small amount of free fluid in the pelvis which is stable. No additional suspicious abnormalities. IMPRESSION: 1. No intracranial abnormalities identified. No metastatic disease. MRI imaging would be more sensitive and specific for  metastatic disease in the brain. 2. Evaluation of the lesions in the chest is limited due to poor timing of contrast. The subpleural nodule in the right upper lobe is similar to slightly smaller in the interval. The larger more inferior nodule in the right upper lobe is a little smaller in the interval. Increased opacity adjacent to these right upper lobe masses, extending centrally towards the right hilum is nonspecific and could represent an infectious or inflammatory process. Evolving postradiation change or worsening malignancy are also possibilities. Recommend attention on follow-up. Interlobular septal thickening in the right upper lobe is mild but new and lymphangitic spread of tumor is not excluded. 3. Bony metastatic disease is essentially stable in the interval. 4. There is a new soft tissue nodule posterior to the left kidney consistent with metastatic disease. This is new in the interval. Electronically Signed   By: Dorise Bullion III M.D   On: 05/11/2016 20:23   Mr Brain W Wo Contrast  Result Date: 05/12/2016 CLINICAL DATA:  Metastatic lung cancer presenting with progressive generalized weakness. EXAM: MRI HEAD WITHOUT AND WITH CONTRAST TECHNIQUE: Multiplanar, multiecho pulse sequences of the brain and surrounding structures were obtained without and with intravenous contrast. CONTRAST:  43m MULTIHANCE GADOBENATE DIMEGLUMINE 529 MG/ML IV SOLN COMPARISON:  CT 05/11/2016. MRI 03/10/2016. Multiple previous MRIs as distant as 01/13/2015 FINDINGS: Brain: Generalized brain atrophy. No brainstem abnormality. Old small vessel infarctions in the cerebellum. Cerebral hemispheres show diffuse white matter signal consistent with radiation change. 4 mm enhancing metastasis in the left posterior frontal vertex is unchanged. There is a  new 2 mm metastasis within the inferior cerebellar vermis on the right. No mass effect or edema. No evidence of acute ischemic infarction. No hemorrhage, hydrocephalus or  extra-axial collection. No pituitary mass. Vascular: Major vessels at the base of the brain show flow. Skull and upper cervical spine: Negative Sinuses/Orbits: Clear/normal Other: None IMPRESSION: No acute finding by MRI. Chronic white matter hyperintensity consistent with radiation change. 4 mm enhancing metastasis in the left posterior frontal vertex region is unchanged without enlargement since 03/10/2016. Newly seen 2 mm metastasis at the inferior cerebellum on the right without mass effect or vasogenic edema. No other new lesions seen. Electronically Signed   By: Nelson Chimes M.D.   On: 05/12/2016 12:55   Ct Abdomen Pelvis W Contrast  Result Date: 05/11/2016 CLINICAL DATA:  Follow-up lung cancer. EXAM: CT HEAD WITHOUT AND WITH CONTRAST CT CHEST, ABDOMEN AND PELVIS WITH CONTRAST TECHNIQUE: Contiguous axial images were obtained from the base of the skull through the vertex without and with intravenous contrast. Multidetector CT imaging of the chest, abdomen and pelvis was performed following the standard protocol during bolus administration of intravenous contrast. CONTRAST:  157m ISOVUE-300 IOPAMIDOL (ISOVUE-300) INJECTION 61%, 385mISOVUE-300 IOPAMIDOL (ISOVUE-300) INJECTION 61%<Contrast>1004mSOVUE-300 IOPAMIDOL (ISOVUE-300) INJECTION 61%, 29m70mOVUE-300 IOPAMIDOL (ISOVUE-300) INJECTION 61% COMPARISON:  CT scan of the brain March 09, 2016, MRI brain March 10, 2016, and CT of the chest/abdomen/ pelvis January 15, 2016 FINDINGS: CT HEAD FINDINGS Brain: No subdural, epidural, or subarachnoid. High attenuation in the right basal ganglia on series 2, image 12 is consistent with calcification. No intraparenchymal hemorrhage is identified. White matter changes are stable and severe. No acute cortical ischemia or infarct. The cerebellum and brainstem are normal. The basal cisterns are unremarkable. No midline shift. No enhancing metastatic disease identified. Vascular: No hyperdense vessel or unexpected  calcification. Skull: Normal. Negative for fracture or focal lesion. Sinuses/Orbits: No acute finding. Other: No other abnormalities. CT CHEST FINDINGS Cardiovascular: No significant vascular findings. Normal heart size. Mediastinum/Nodes: No adenopathy is seen within the chest. The thyroid is normal in appearance. The thoracic aorta is non aneurysmal. A small amount of pericardial fluid is stable. The heart size is within normal limits. Mild coronary artery calcifications are noted. The central great vessels otherwise normal. Lungs/Pleura: The central airways are normal. No pneumothorax. The timing of the contrast is suboptimal limiting evaluation of the patient's known lung malignancies. The 2 separate lesions in the right upper chest on the previous study are difficult to differentiate on today's study. Additionally, there is increased infiltrate surrounding the known malignancy. The subpleural nodule seen on the previous study is difficult to measure due to timing of contrast. However, it appears to be similar in size measuring 2.0 x 2.7 cm today versus 2.3 x 2.7 cm previously. The larger slightly more inferior mass is also difficult to measure but measures approximately 3.0 by 2.8 cm today versus 3.5 x 3.2 cm previously. There is increased infiltrate surrounding this mass and extending towards the suprahilar region on the right. By report, the patient's last radiation therapy to the lung was April 2017. The infiltrate in the medial right lower lobe is likely unchanged but obscured by an enlarging right-sided pleural effusion and atelectasis. The right-sided pleural effusion is larger in the interval. There is a small left effusion now present as well. There is interlobular septal thickening in the right upper lobe which is mild but new. No other changes on the right. There is atelectasis in the left base. No  suspicious nodules or masses are seen in the left lung. Musculoskeletal: The mixed lytic and sclerotic  lesion with pathologic fracture in T9 is stable. A sclerotic focus the anterior aspect of C7 is stable. A sclerotic focus near the superior endplate of J62 is stable. Sclerosis in the manubrium is unchanged. Sclerosis in the anterior eleventh seventh rib is stable. Sclerosis in the posterior left fifth rib is stable. No entirely new bony metastatic lesions are seen within the chest. CT ABDOMEN AND PELVIS FINDINGS Hepatobiliary: Evaluation of the parenchymal organs is limited due to timing of contrast. There is a cyst in the left hepatic lobe. Another seen in the right hepatic lobe. No liver metastases are identified. The gallbladder and portal vein are unremarkable. Pancreas: No mass, inflammatory changes, or other significant abnormality. Spleen: Within normal limits in size and appearance. Adrenals/Urinary Tract: The adrenal glands are normal in appearance. No suspicious renal masses are seen. There is a nodule however posterior to the left kidney on series 2, image 59 measuring 14 by 12 mm, new in the interval. Stomach/Bowel: A PEG tube is identified. The stomach and small bowel are otherwise normal. The colon and appendix are unremarkable. The Vascular/Lymphatic: The abdominal aorta is non aneurysmal. Mild atherosclerotic change in the aorta. No adenopathy. Reproductive: No mass or other significant abnormality. Musculoskeletal: A tiny sclerotic focus in T12 is stable. Metastatic disease and L2 and L3 with endplate irregularity is stable. No disease and L4. Sclerosis in the lateral aspect of L5 is stable. 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 No new bony metastatic disease seen in the abdomen or pelvis today. Other: There is a small amount of free fluid in the pelvis which is stable. No additional suspicious abnormalities. IMPRESSION: 1. No intracranial abnormalities identified. No metastatic disease. MRI imaging would be more sensitive and specific for metastatic disease in the  brain. 2. Evaluation of the lesions in the chest is limited due to poor timing of contrast. The subpleural nodule in the right upper lobe is similar to slightly smaller in the interval. The larger more inferior nodule in the right upper lobe is a little smaller in the interval. Increased opacity adjacent to these right upper lobe masses, extending centrally towards the right hilum is nonspecific and could represent an infectious or inflammatory process. Evolving postradiation change or worsening malignancy are also possibilities. Recommend attention on follow-up. Interlobular septal thickening in the right upper lobe is mild but new and lymphangitic spread of tumor is not excluded. 3. Bony metastatic disease is essentially stable in the interval. 4. There is a new soft tissue nodule posterior to the left kidney consistent with metastatic disease. This is new in the interval. Electronically Signed   By: Dorise Bullion III M.D   On: 05/11/2016 20:23   Dg Chest Port 1 View  Result Date: 05/11/2016 CLINICAL DATA:  Worsening weakness today. Metastatic lung carcinoma. EXAM: PORTABLE CHEST 1 VIEW COMPARISON:  Chest CT on 01/15/2016 FINDINGS: Persistent mass seen in the lateral right upper lobe, similar in appearance to recent chest CT. No new or increased areas of pulmonary opacity are seen. No evidence of pneumothorax or pleural effusion. Heart size is normal. IMPRESSION: Persistent mass in lateral right upper lobe, similar in appearance to recent chest CT. No new findings. Electronically Signed   By: Earle Gell M.D.   On: 05/11/2016 12:56    Medications: I have reviewed the patient's current medications.  Assessment/Plan: This is a very pleasant 65 years old  white female with a stage IV non-small cell lung cancer currently undergoing systemic chemotherapy with carboplatin, Alimta and Ketruda (pembrolizumab) status post 2 cycles. Her treatment is currently on hold secondary to significant thrombocytopenia. The  recent CT scan of the chest, abdomen and pelvis showed no concerning finding for disease progression except for the new soft tissue nodule posterior to the left kidney. Her brain MRI also showed 2 mm right cerebellar lesion with no concerning vasogenic edema. I had a lengthy discussion with the patient and her partner today about her current status and treatment options. I advised her to continue holding her systemic therapy until improvement of her condition and resolution of the thrombocytopenia. I also discussed with the patient consideration of palliative care but she is not interested in hospice at this point. For the new metastatic brain lesion, I recommended for the patient to see Dr. Lisbeth Renshaw for evaluation. For the generalized weakness and fatigue, she may benefit from physical therapy. Thank you so much for taking good care of Ms. Mccort. I will continue to follow up the patient with you and assist in her management an as-needed basis.    LOS: 0 days    Sharyon Peitz K. 05/13/2016

## 2016-05-13 NOTE — Evaluation (Signed)
Occupational Therapy Evaluation Patient Details Name: Laura Walter MRN: [id number] DOB: 1951/04/02 Today's Date: 05/13/2016    History of Present Illness Pt admitted 2* increasing weakness with L UE and LE more affected than R.  Pt with hx of Lung CA and brain mets   Clinical Impression   Pt admitted with weakness LUE and LLE. Pt currently with functional limitations due to the deficits listed below (see OT Problem List).  Pt will benefit from skilled OT to increase their safety and independence with ADL and functional mobility for ADL to facilitate discharge to venue listed below.      Follow Up Recommendations  Home health OT;Supervision/Assistance - 24 hour    Equipment Recommendations  3 in 1 bedside comode    Recommendations for Other Services       Precautions / Restrictions Precautions Precautions: Fall Restrictions Weight Bearing Restrictions: No      Mobility Bed Mobility Overal bed mobility: Needs Assistance Bed Mobility: Supine to Sit     Supine to sit: Mod assist;+2 for safety/equipment     General bed mobility comments: cues for sequence and assist to manage LEs, bring trunk to upright and move to EOB  Transfers Overall transfer level: Needs assistance Equipment used: Rolling walker (2 wheeled)   Sit to Stand: Max assist Stand pivot transfers: Max assist            Balance                                            ADL Overall ADL's : Needs assistance/impaired Eating/Feeding: Minimal assistance;Sitting   Grooming: Moderate assistance;Sitting   Upper Body Bathing: Moderate assistance;Sitting   Lower Body Bathing: Maximal assistance;Sit to/from stand   Upper Body Dressing : Minimal assistance;Sitting   Lower Body Dressing: Maximal assistance;Sit to/from stand   Toilet Transfer: Maximal assistance;Squat-pivot;Cueing for sequencing;Cueing for safety;BSC   Toileting- Clothing Manipulation and Hygiene: Sit to/from  stand;Cueing for safety;Cueing for sequencing;Maximal assistance                         Pertinent Vitals/Pain Pain Assessment: No/denies pain Pain Score: 4  Pain Location: r shoulder with ROM     Hand Dominance Right   Extremity/Trunk Assessment Upper Extremity Assessment Upper Extremity Assessment: Generalized weakness;LUE deficits/detail;Difficult to assess due to impaired cognition RUE Deficits / Details: pain with AAROM over 70 degrees shoulder flexion LUE Deficits / Details: ~3-/5           Communication Communication Communication: No difficulties   Cognition Arousal/Alertness: Awake/alert Behavior During Therapy: Flat affect Overall Cognitive Status: Within Functional Limits for tasks assessed                                Home Living Family/patient expects to be discharged to:: Private residence Living Arrangements: Non-relatives/Friends Available Help at Discharge: Friend(s);Available 24 hours/day Type of Home: House Home Access: Stairs to enter [employer] of Steps: 2 Entrance Stairs-Rails: Right;Can reach both;Left Home Layout: One level     Bathroom Shower/Tub: Walk-in shower         Home Equipment: Environmental consultant - 2 wheels;Bedside commode;Hospital bed          Prior Functioning/Environment Level of Independence: Needs assistance  Gait / Transfers Assistance Needed: Pt utilizing RW to  ambulate short distances but requiring increasing levels of assist              OT Problem List: Decreased strength;Decreased activity tolerance;Pain;Impaired balance (sitting and/or standing)   OT Treatment/Interventions: Self-care/ADL training;DME and/or AE instruction;Patient/family education;Therapeutic activities    OT Goals(Current goals can be found in the care plan section)    OT Frequency: Min 2X/week   Barriers to D/C:               End of Session Nurse Communication: Mobility status  Activity Tolerance: Patient  tolerated treatment well Patient left: in bed;with call bell/phone within reach;with family/visitor present;with nursing/sitter in room   Time: 8185-6314 OT Time Calculation (min): 17 min Charges:  OT General Charges $OT Visit: 1 Procedure OT Evaluation $OT Eval Moderate Complexity: 1 Procedure G-Codes:    Payton Mccallum D May 17, 2016, 4:53 PM

## 2016-05-14 ENCOUNTER — Observation Stay (HOSPITAL_COMMUNITY): Payer: BC Managed Care – PPO

## 2016-05-14 DIAGNOSIS — R627 Adult failure to thrive: Secondary | ICD-10-CM | POA: Diagnosis present

## 2016-05-14 DIAGNOSIS — Z882 Allergy status to sulfonamides status: Secondary | ICD-10-CM | POA: Diagnosis not present

## 2016-05-14 DIAGNOSIS — Z923 Personal history of irradiation: Secondary | ICD-10-CM | POA: Diagnosis not present

## 2016-05-14 DIAGNOSIS — E860 Dehydration: Secondary | ICD-10-CM | POA: Diagnosis not present

## 2016-05-14 DIAGNOSIS — R1110 Vomiting, unspecified: Secondary | ICD-10-CM | POA: Diagnosis not present

## 2016-05-14 DIAGNOSIS — E43 Unspecified severe protein-calorie malnutrition: Secondary | ICD-10-CM | POA: Diagnosis present

## 2016-05-14 DIAGNOSIS — Z66 Do not resuscitate: Secondary | ICD-10-CM | POA: Diagnosis not present

## 2016-05-14 DIAGNOSIS — C7951 Secondary malignant neoplasm of bone: Secondary | ICD-10-CM | POA: Diagnosis present

## 2016-05-14 DIAGNOSIS — R531 Weakness: Secondary | ICD-10-CM | POA: Diagnosis not present

## 2016-05-14 DIAGNOSIS — Z8701 Personal history of pneumonia (recurrent): Secondary | ICD-10-CM | POA: Diagnosis not present

## 2016-05-14 DIAGNOSIS — K5900 Constipation, unspecified: Secondary | ICD-10-CM | POA: Diagnosis present

## 2016-05-14 DIAGNOSIS — Z6821 Body mass index (BMI) 21.0-21.9, adult: Secondary | ICD-10-CM | POA: Diagnosis not present

## 2016-05-14 DIAGNOSIS — Z800 Family history of malignant neoplasm of digestive organs: Secondary | ICD-10-CM | POA: Diagnosis not present

## 2016-05-14 DIAGNOSIS — Z79899 Other long term (current) drug therapy: Secondary | ICD-10-CM | POA: Diagnosis not present

## 2016-05-14 DIAGNOSIS — Z931 Gastrostomy status: Secondary | ICD-10-CM | POA: Diagnosis not present

## 2016-05-14 DIAGNOSIS — Z7189 Other specified counseling: Secondary | ICD-10-CM | POA: Diagnosis not present

## 2016-05-14 DIAGNOSIS — C799 Secondary malignant neoplasm of unspecified site: Secondary | ICD-10-CM | POA: Diagnosis not present

## 2016-05-14 DIAGNOSIS — Z85118 Personal history of other malignant neoplasm of bronchus and lung: Secondary | ICD-10-CM | POA: Diagnosis not present

## 2016-05-14 DIAGNOSIS — Z8249 Family history of ischemic heart disease and other diseases of the circulatory system: Secondary | ICD-10-CM | POA: Diagnosis not present

## 2016-05-14 DIAGNOSIS — C7931 Secondary malignant neoplasm of brain: Secondary | ICD-10-CM | POA: Diagnosis not present

## 2016-05-14 DIAGNOSIS — Z833 Family history of diabetes mellitus: Secondary | ICD-10-CM | POA: Diagnosis not present

## 2016-05-14 DIAGNOSIS — D61818 Other pancytopenia: Secondary | ICD-10-CM | POA: Diagnosis present

## 2016-05-14 DIAGNOSIS — Z515 Encounter for palliative care: Secondary | ICD-10-CM | POA: Diagnosis not present

## 2016-05-14 DIAGNOSIS — C3411 Malignant neoplasm of upper lobe, right bronchus or lung: Secondary | ICD-10-CM | POA: Diagnosis present

## 2016-05-14 DIAGNOSIS — R112 Nausea with vomiting, unspecified: Secondary | ICD-10-CM | POA: Diagnosis not present

## 2016-05-14 LAB — CBC WITH DIFFERENTIAL/PLATELET
BASOS PCT: 0 %
Basophils Absolute: 0 10*3/uL (ref 0.0–0.1)
Eosinophils Absolute: 0.1 10*3/uL (ref 0.0–0.7)
Eosinophils Relative: 2 %
HEMATOCRIT: 25.7 % — AB (ref 36.0–46.0)
HEMOGLOBIN: 8.6 g/dL — AB (ref 12.0–15.0)
LYMPHS ABS: 0.3 10*3/uL — AB (ref 0.7–4.0)
Lymphocytes Relative: 7 %
MCH: 32.2 pg (ref 26.0–34.0)
MCHC: 33.5 g/dL (ref 30.0–36.0)
MCV: 96.3 fL (ref 78.0–100.0)
MONOS PCT: 10 %
Monocytes Absolute: 0.4 10*3/uL (ref 0.1–1.0)
NEUTROS ABS: 3.3 10*3/uL (ref 1.7–7.7)
NEUTROS PCT: 82 %
Platelets: 56 10*3/uL — ABNORMAL LOW (ref 150–400)
RBC: 2.67 MIL/uL — ABNORMAL LOW (ref 3.87–5.11)
RDW: 17.8 % — ABNORMAL HIGH (ref 11.5–15.5)
WBC: 4 10*3/uL (ref 4.0–10.5)

## 2016-05-14 LAB — BASIC METABOLIC PANEL
ANION GAP: 6 (ref 5–15)
BUN: 15 mg/dL (ref 6–20)
CHLORIDE: 111 mmol/L (ref 101–111)
CO2: 21 mmol/L — AB (ref 22–32)
Calcium: 8 mg/dL — ABNORMAL LOW (ref 8.9–10.3)
Creatinine, Ser: 0.57 mg/dL (ref 0.44–1.00)
GFR calc non Af Amer: >60 mL/min (ref >60)
Glucose, Bld: 105 mg/dL — ABNORMAL HIGH (ref 65–99)
Potassium: 3.8 mmol/L (ref 3.5–5.1)
Sodium: 138 mmol/L (ref 135–145)

## 2016-05-14 LAB — GLUCOSE, CAPILLARY: Glucose-Capillary: 107 mg/dL — ABNORMAL HIGH (ref 65–99)

## 2016-05-14 MED ORDER — POLYETHYLENE GLYCOL 3350 17 G PO PACK
17.0000 g | PACK | Freq: Two times a day (BID) | ORAL | Status: DC
  Administered 2016-05-14 – 2016-05-16 (×4): 17 g via ORAL
  Filled 2016-05-14 (×3): qty 1

## 2016-05-14 MED ORDER — SENNOSIDES-DOCUSATE SODIUM 8.6-50 MG PO TABS
1.0000 | ORAL_TABLET | Freq: Two times a day (BID) | ORAL | Status: DC
  Administered 2016-05-14 – 2016-05-16 (×4): 1 via ORAL
  Filled 2016-05-14 (×4): qty 1

## 2016-05-14 NOTE — Progress Notes (Signed)
Pt had medium size bowel movement at the beginning of shift. Pt stated that she does not want soap suds enema at this time.

## 2016-05-14 NOTE — Progress Notes (Signed)
Occupational Therapy Treatment Patient Details Name: Laura Walter MRN: [id number] DOB: November 25, 1950 Today's Date: 05/14/2016    History of present illness Pt admitted 2* increasing weakness with L UE and LE more affected than R.  Pt with hx of Lung CA and brain mets   OT comments  Yellow theraputty issued  Follow Up Recommendations  Home health OT;Supervision/Assistance - 24 hour    Equipment Recommendations  3 in 1 bedside comode    Recommendations for Other Services      Precautions / Restrictions Precautions Precautions: Fall Restrictions Weight Bearing Restrictions: No              ADL Overall ADL's : Needs assistance/impaired                                       General ADL Comments: pt in bed- had been sick earlier.  Provided pt with yellow theraputty - instructed how to use to increase hand strength.  Pt very fatigued and falling asleep. Pts wife aware of putty and what to do                Cognition   Behavior During Therapy: WFL for tasks assessed/performed Overall Cognitive Status: Within Functional Limits for tasks assessed                         Exercises  instructed in hand exercise with yellow theraputty           Pertinent Vitals/ Pain       Pain Assessment: No/denies pain      Progress Toward Goals  OT Goals(current goals can now be found in the care plan section)  Progress towards OT goals: OT to reassess next treatment     Plan Discharge plan remains appropriate          Activity Tolerance Patient limited by fatigue   Patient Left in bed;with family/visitor present   Nurse Communication      Functional Assessment Tool Used: clinical observation Functional Limitation: Self care Self Care Current Status (P6195): At least 60 percent but less than 80 percent impaired, limited or restricted Self Care Goal Status (K9326): At least 1 percent but less than 20 percent impaired, limited or restricted    Time: 1255-1310 OT Time Calculation (min): 15 min  Charges: OT G-codes **NOT FOR INPATIENT CLASS** Functional Assessment Tool Used: clinical observation Functional Limitation: Self care Self Care Current Status (Z1245): At least 60 percent but less than 80 percent impaired, limited or restricted Self Care Goal Status (Y0998): At least 1 percent but less than 20 percent impaired, limited or restricted OT General Charges $OT Visit: 1 Procedure OT Treatments $Therapeutic Exercise: 8-22 mins  [place], Thereasa Parkin 05/14/2016, 1:14 PM

## 2016-05-14 NOTE — Progress Notes (Signed)
I spoke with the patient yesterday but could not reach her partner by phone. Laura Walter brain MRI has been reviewed by Dr. Lisbeth Renshaw, and at this time, the focus of new metastasis is so small, this would be unlikely to cause her symptoms. Rather, the patient's course is more likely a result of previous treatment, and deconditioning. We will be available as needed during the remainder of the patient's admission, and will plan to follow up with her MRI which is due in November to track her response to her most recent radiation treatment, and we will reassess the met seen on this admission's MRI.       Laura Walter, [name]

## 2016-05-14 NOTE — Progress Notes (Signed)
PROGRESS NOTE    Laura Walter  KDX:[id number] DOB: 12-13-1950 DOA: 05/11/2016 PCP: [name], MD    Brief Narrative:   Laura Walter is a 65 y.o. female with medical history significant of metastatic lung cancer with brain met.  Patient presented to ED with progressive generalized weakness.  She had been getting progressively weaker over the past weeks-months but this has especially been severe over the past 1 day where she has become too weak to stand up.  Weakness is in both arms and both legs, no numbness, tingling, pain.  No SOB, no vomiting, no fever, has chronic cough that is unchanged.  No recent seizures.  ED Course: whole body CT scan unremarkable other than a tiny new nodule behind left kidney, some non-specific haziness next to a lung lesion.  UA negative, no SIRS.  BMP unremarkable.  CBC shows improvement of anemia post transfusion.   Assessment & Plan:   Principal Problem:   Weakness Active Problems:   Emesis   Malignant neoplasm of upper lobe of right lung (HCC)   Brain metastases (HCC)   Dehydration   Metastatic cancer ([place])  #1 generalized weakness Patient with generalized weakness with left lower extremity weakness greater than right lower extremity weakness. Likely secondary to new 2 mm metastatic lesion in the cerebellum noted on MRI of the brain and debility and likely secondary to medications. Patient with no signs or symptoms of infection. CT chest abdomen and pelvis unremarkable other than a tiny new nodule behind left kidney. Continue Keppra for seizure prophylaxis. IV fluids. PT/OT. Follow.  #2 dehydration Patient hydrated with IV fluids.   #3 metastatic non-small cell lung cancer CT chest, abdomen, pelvis done was unremarkable other than a tiny new nodule behind the left kidney. Patient had presented with generalized weakness and a such MRI of the brain was obtained showed a new 2 mm metastatic lesion in the cerebellum. Continued on Keppra for seizure  prophylaxis. Patient with poor performance score. Oncology has been consulted. Radiation oncology has also been consulted. Patient is to follow-up in the outpatient setting with oncology and radiation oncology.   #4 pancytopenia Likely secondary to metastatic non-small cell lung cancer. Patient with no bleeding. Follow. Patient has been seen by hematology/oncology. Patient will follow-up in the outpatient setting.  #5 emesis/constipation Patient with complaints of emesis. Patient states has not had a bowel movement in approximately 6 days. Acute abdominal series. Place on a soapsuds enema. Place on MiraLAX twice daily as well as Senokot-S for bowel regimen. IV fluids. Follow.    DVT prophylaxis: SCDs Code Status: Full Family Communication: Updated patient and significant other and friend at bedside. Disposition Plan: Likely home when nausea vomiting has resolved and patient no longer constipated hopefully tomorrow 05/15/2016.    Consultants:   Oncology: Dr. Lorna Few 05/13/2016  Radiation oncology: Dr Lisbeth Renshaw 05/13/2016  Procedures:   MRI brain 05/12/2016  Chest x-ray 05/11/2016  CT head/CT chest/CT abdomen and pelvis 05/11/2016  Antimicrobials:   None   Subjective: Patient sitting up in bed. Per family sleeping howev and patient patient with significant emesis of nausea early on this morning which has since improved. Patient with no chest pain. No shortness of breath. Patient still with weakness. Patient c/o constipation.  Objective: Vitals:   05/13/16 1325 05/13/16 2239 05/14/16 0409 05/14/16 1340  BP: 110/74 124/83 (!) 147/91 134/75  Pulse: (!) 108 (!) 107 (!) 107 (!) 107  Resp: 20 16 16 16   Temp: 98.8 F (37.1  C) 98.2 F (36.8 C) 99.4 F (37.4 C) 98.2 F (36.8 C)  TempSrc: Oral Oral Oral Oral  SpO2: 99% 98% 97% 96%  Weight:      Height:        Intake/Output Summary (Last 24 hours) at 05/14/16 1542 Last data filed at 05/14/16 1526  Gross per 24 hour    Intake              360 ml  Output                0 ml  Net              360 ml   Filed Weights   05/11/16 2230  Weight: 59.6 kg (131 lb 6.3 oz)    Examination:  General exam: Appears calm and comfortable.Dry mucous membranes. Respiratory system: Clear to auscultation. Respiratory effort normal. Cardiovascular system: S1 & S2 heard, RRR. No JVD, murmurs, rubs, gallops or clicks. No pedal edema. Gastrointestinal system: Abdomen is nondistended, soft and nontender. No organomegaly or masses felt. Normal bowel sounds heard. Central nervous system: Alert and oriented. No focal neurological deficits. Extremities: 5/5 BUE strength. 2/5 LLE strength.  4/5 RLE strength. Skin: No rashes, lesions or ulcers Psychiatry: Judgement and insight appear normal. Mood & affect appropriate.     Data Reviewed: I have personally reviewed following labs and imaging studies  CBC:  Recent Labs Lab 05/08/16 1050 05/11/16 1210 05/12/16 0418 05/13/16 0340 05/14/16 0341  WBC 4.7 5.1 3.8* 3.8* 4.0  NEUTROABS 3.9 4.1  --   --  3.3  HGB 7.2* 9.8* 9.0* 8.6* 8.6*  HCT 21.8* 29.2* 27.0* 25.9* 25.7*  MCV 95.2 93.0 95.4 95.2 96.3  PLT 52* 50* 47* 47* 56*   Basic Metabolic Panel:  Recent Labs Lab 05/08/16 1050 05/11/16 1210 05/12/16 0418 05/13/16 0340 05/14/16 0341  NA 139 138 137 138 138  K 4.1 3.5 3.7 3.5 3.8  CL  --  107 108 109 111  CO2 21* 23 21* 22 21*  GLUCOSE 174* 135* 97 96 105*  BUN 26.7* 27* 19 16 15   CREATININE 0.8 0.77 0.72 0.63 0.57  CALCIUM 9.2 8.1* 8.0* 7.8* 8.0*   GFR: Estimated Creatinine Clearance: 68.3 mL/min (by C-G formula based on SCr of 0.57 mg/dL). Liver Function Tests:  Recent Labs Lab 05/08/16 1050 05/11/16 1210  AST 24 33  ALT 24 35  ALKPHOS 74 67  BILITOT 0.43 0.5  PROT 7.0 6.5  ALBUMIN 2.6* 2.7*   No results for input(s): LIPASE, AMYLASE in the last 168 hours. No results for input(s): AMMONIA in the last 168 hours. Coagulation Profile: No results  for input(s): INR, PROTIME in the last 168 hours. Cardiac Enzymes: No results for input(s): CKTOTAL, CKMB, CKMBINDEX, TROPONINI in the last 168 hours. BNP (last 3 results) No results for input(s): PROBNP in the last 8760 hours. HbA1C: No results for input(s): HGBA1C in the last 72 hours. CBG: No results for input(s): GLUCAP in the last 168 hours. Lipid Profile: No results for input(s): CHOL, HDL, LDLCALC, TRIG, CHOLHDL, LDLDIRECT in the last 72 hours. Thyroid Function Tests: No results for input(s): TSH, T4TOTAL, FREET4, T3FREE, THYROIDAB in the last 72 hours. Anemia Panel: No results for input(s): VITAMINB12, FOLATE, FERRITIN, TIBC, IRON, RETICCTPCT in the last 72 hours. Sepsis Labs:  Recent Labs Lab 05/11/16 1259 05/11/16 1557  LATICACIDVEN 1.34 0.69    Recent Results (from the past 240 hour(s))  Urine culture     Status: None  Collection Time: 05/11/16 12:15 PM  Result Value Ref Range Status   Specimen Description URINE, RANDOM  Final   Special Requests NONE  Final   Culture NO GROWTH Performed at [facility]   Final   Report Status 05/12/2016 FINAL  Final  Blood Culture (routine x 2)     Status: None (Preliminary result)   Collection Time: 05/11/16 12:39 PM  Result Value Ref Range Status   Specimen Description BLOOD LEFT ANTECUBITAL  Final   Special Requests BOTTLES DRAWN AEROBIC AND ANAEROBIC 5CC  Final   Culture   Final    NO GROWTH 3 DAYS Performed at [facility]    Report Status PENDING  Incomplete  Blood Culture (routine x 2)     Status: None (Preliminary result)   Collection Time: 05/11/16 12:47 PM  Result Value Ref Range Status   Specimen Description BLOOD LEFT HAND  Final   Special Requests IN PEDIATRIC BOTTLE 2CC  Final   Culture   Final    NO GROWTH 3 DAYS Performed at [facility]    Report Status PENDING  Incomplete         Radiology Studies: Dg Abd Acute W/chest  Result Date: 05/14/2016 CLINICAL DATA:  Weakness  and emesis.  Pneumonia.  Lung cancer. EXAM: DG ABDOMEN ACUTE W/ 1V CHEST COMPARISON:  05/11/2016 and CT scan from 05/11/2016. FINDINGS: Right apical lung mass. Retrocardiac density on the right similar to prior. Volume loss on the right. Heart size within normal limits. T9 compression fracture. Left-side-down decubitus view the abdomen demonstrates no free intraperitoneal gas. There is contrast medium within the colon. No significant abnormal air-fluid levels are identified. There is contrast medium in the distal small bowel as well. Contrast is present in the rectum. Left bony pelvic deformities from old healed fractures. Percutaneous gastrostomy tube noted. Mild levoconvex rotary scoliosis of the lumbar spine. Sclerosis at the L3 vertebral level compatible with known osseous metastatic disease in this vicinity. IMPRESSION: 1. Right apical lung mass with stable right retrocardiac density. 2. No appreciable change in the osseous metastatic disease. 3. No free air identified. Contrast medium in the bowel. No dilated bowel noted. Electronically Signed   By: Van Clines M.D.   On: 05/14/2016 15:32        Scheduled Meds: . feeding supplement (OSMOLITE 1.5 CAL)  237 mL Per Tube TID  . ferrous [id number] C-folic acid  1 capsule Oral Daily  . folic acid  1 mg Oral Daily  . levETIRAcetam  500 mg Oral Daily  . levETIRAcetam  750 mg Oral QHS  . OLANZapine  10 mg Oral QHS   Continuous Infusions: . sodium chloride 75 mL/hr at 05/14/16 1418     LOS: 0 days    Time spent: 63 minutes    Sulaiman Imbert, MD [facility] Pager [phone]  If 7PM-7AM, please contact night-coverage [email or web address] Password [email or web address] 05/14/2016, 3:42 PM

## 2016-05-14 NOTE — Progress Notes (Signed)
Palliative Medicine Team consult was received.   Patient was tired and sleeping this evening, but agreeable to meeting tomorrow morning to discuss goals moving forward. I did begin having initial conversation with patient's long-term partner, [name], to introduce palliative care.  We will plan for a follow-up meeting tomorrow morning to continue conversation. [name] reports that patient goals are to transition home and continue pursuing disease modifying therapy with Dr. Earlie Server if she is a candidate to do so in the future.  I discussed with [name] regarding advance care planning and introduced the concept of MOST form as well as healthcare power of attorney. [name] reports that she believes she has a healthcare power of attorney paperwork completed at home and she will bring in tomorrow to review and ensure that this is, in fact, what they have completed       If there are urgent needs or questions please call [phone]. Thank you for consulting out team to assist with this patients care.  NO CHARGE NOTE  Micheline Rough, MD [place] Team [phone]

## 2016-05-14 NOTE — Progress Notes (Signed)
This CM met with pt and partner [name] at bedside to ask about whether they needed the 3in1 that was ordered as previous CM note indicated that they had one at home. [name] confirmed that they do have one at home. This CM also contacted [facility] rep to confirm that they were following pt. [facility] to deliver [facility] services at discharge. No other CM needs communicated. Marney Doctor RN,BSN,NCM [phone]

## 2016-05-14 NOTE — Progress Notes (Signed)
PT Cancellation Note  Patient Details Name: Laura Walter MRN: [id number] DOB: 16-Feb-1951   Cancelled Treatment:    Reason Eval/Treat Not Completed: Patient at procedure or test/unavailable   Daton Szilagyi,KATHrine E 05/14/2016, 2:51 PM Carmelia Bake, PT, DPT 05/14/2016  Pager: [phone]

## 2016-05-15 ENCOUNTER — Telehealth: Payer: Self-pay | Admitting: Radiation Oncology

## 2016-05-15 ENCOUNTER — Other Ambulatory Visit: Payer: BC Managed Care – PPO

## 2016-05-15 DIAGNOSIS — E860 Dehydration: Secondary | ICD-10-CM

## 2016-05-15 DIAGNOSIS — R112 Nausea with vomiting, unspecified: Secondary | ICD-10-CM

## 2016-05-15 DIAGNOSIS — Z7189 Other specified counseling: Secondary | ICD-10-CM

## 2016-05-15 DIAGNOSIS — E43 Unspecified severe protein-calorie malnutrition: Secondary | ICD-10-CM | POA: Insufficient documentation

## 2016-05-15 DIAGNOSIS — Z515 Encounter for palliative care: Secondary | ICD-10-CM

## 2016-05-15 LAB — BASIC METABOLIC PANEL
Anion gap: 7 (ref 5–15)
BUN: 13 mg/dL (ref 6–20)
CALCIUM: 8.1 mg/dL — AB (ref 8.9–10.3)
CO2: 21 mmol/L — AB (ref 22–32)
Chloride: 108 mmol/L (ref 101–111)
Creatinine, Ser: 0.58 mg/dL (ref 0.44–1.00)
GFR calc Af Amer: >60 mL/min (ref >60)
GLUCOSE: 105 mg/dL — AB (ref 65–99)
Potassium: 3.6 mmol/L (ref 3.5–5.1)
Sodium: 136 mmol/L (ref 135–145)

## 2016-05-15 LAB — CBC
HEMATOCRIT: 24.9 % — AB (ref 36.0–46.0)
Hemoglobin: 8.4 g/dL — ABNORMAL LOW (ref 12.0–15.0)
MCH: 31.2 pg (ref 26.0–34.0)
MCHC: 33.7 g/dL (ref 30.0–36.0)
MCV: 92.6 fL (ref 78.0–100.0)
PLATELETS: 54 10*3/uL — AB (ref 150–400)
RBC: 2.69 MIL/uL — AB (ref 3.87–5.11)
RDW: 17.6 % — ABNORMAL HIGH (ref 11.5–15.5)
WBC: 5.9 10*3/uL (ref 4.0–10.5)

## 2016-05-15 LAB — MAGNESIUM: Magnesium: 1.8 mg/dL (ref 1.7–2.4)

## 2016-05-15 MED ORDER — PRO-STAT SUGAR FREE PO LIQD
30.0000 mL | Freq: Two times a day (BID) | ORAL | Status: DC
  Administered 2016-05-15: 5 mL via ORAL
  Administered 2016-05-15 – 2016-05-16 (×2): 30 mL via ORAL
  Filled 2016-05-15 (×3): qty 30

## 2016-05-15 MED ORDER — ONDANSETRON HCL 4 MG/2ML IJ SOLN: 4.0000 mg | Freq: Four times a day (QID) | INTRAMUSCULAR | Status: DC | PRN

## 2016-05-15 NOTE — NC FL2 (Signed)
[place] LEVEL OF CARE SCREENING TOOL     IDENTIFICATION  Patient Name: Laura Walter Birthdate: November 22, 1950 Sex: female Admission Date (Current Location): 05/11/2016  [facility] and [place] Number:  Herbalist and Address:  [facility],  [place] [address] [place]      Provider Number: [id number]  Attending Physician Name and Address:  Hosie Poisson, MD  Relative Name and Phone Number:       Current Level of Care: Hospital Recommended Level of Care: [place] Prior Approval Number:    Date Approved/Denied:   PASRR Number:    Discharge Plan: SNF    Current Diagnoses: Patient Active Problem List   Diagnosis Date Noted  . Emesis 05/14/2016  . Metastatic cancer ([place])   . Weakness 05/11/2016  . Aphasia 03/12/2016  . Pain management 03/09/2016  . Dehydration 03/09/2016  . 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 Brain metastases ([place]) 02/14/2016  . Paronychia 11/17/2015  . Paronychia of great toe, right 08/21/2015  . Paronychia of great toe, left 08/21/2015  . Nausea without vomiting 04/24/2015  . Feeding difficulties 04/11/2015  . Encounter for antineoplastic chemotherapy 03/06/2015  . Hypokalemia 02/15/2015  . Protein-calorie malnutrition ([place]) 02/15/2015  . Neutropenic fever ([place]) 02/11/2015  . Neutropenia with fever ([place]) 02/11/2015  . Bicytopenia 02/11/2015  . Diarrhea 02/11/2015  . Malignant neoplasm of upper lobe of right lung ([place]) 01/05/2015  . 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 Physical exam 10/13/2014  . Osteopenia 10/07/2013  . Benign positional vertigo 05/08/2012  . TINNITUS, LEFT 01/09/2009  . CARDIAC MURMUR, AORTIC 01/05/2007  . MOTION SICKNESS 01/05/2007    Orientation RESPIRATION BLADDER Height & Weight     Self, Time, Situation, Place  Normal Continent Weight: 131 lb 6.3 oz (59.6 kg) Height:  '5\' 6"'$  (167.6 cm)  BEHAVIORAL SYMPTOMS/MOOD  NEUROLOGICAL BOWEL NUTRITION STATUS      Continent Diet (regular)  AMBULATORY STATUS COMMUNICATION OF NEEDS Skin   Limited Assist Verbally Normal                       Personal Care Assistance Level of Assistance  Bathing, Dressing Bathing Assistance: Limited assistance   Dressing Assistance: Limited assistance     Functional Limitations Info  Sight, Hearing, Speech Sight Info: Adequate Hearing Info: Adequate Speech Info: Adequate    SPECIAL CARE FACTORS FREQUENCY  PT (By licensed PT)     PT Frequency: 5 times week              Contractures      Additional Factors Info  Code Status, Allergies Code Status Info: DNR Allergies Info: sulfonamide derivitives           Current Medications (05/15/2016):  This is the current hospital active medication list Current Facility-Administered Medications  Medication Dose Route Frequency Provider Last Rate Last Dose  . 0.9 %  sodium chloride infusion   Intravenous Continuous Eugenie Filler, MD 75 mL/hr at 05/14/16 1418    . feeding supplement (OSMOLITE 1.5 CAL) liquid 237 mL  237 mL Per Tube TID Etta Quill, DO 50 mL/hr at 05/15/16 1006 237 mL at 05/15/16 1006  . feeding supplement (PRO-STAT SUGAR FREE 64) liquid 30 mL  30 mL Oral BID Hosie Poisson, MD      . ferrous [id number] C-folic acid (TRINSICON / FOLTRIN) capsule 1 capsule  1 capsule Oral Daily Orlie Dakin, MD  1 capsule at 05/15/16 1005  . folic acid (FOLVITE) tablet 1 mg  1 mg Oral Daily Etta Quill, DO   1 mg at 05/15/16 1005  . HYDROcodone-acetaminophen (NORCO/VICODIN) 5-325 MG per tablet 1-2 tablet  1-2 tablet Oral Q4H PRN Etta Quill, DO      . levETIRAcetam (KEPPRA) tablet 500 mg  500 mg Oral Daily Etta Quill, DO   500 mg at 05/15/16 1006  . levETIRAcetam (KEPPRA) tablet 750 mg  750 mg Oral QHS Etta Quill, DO   750 mg at 05/14/16 2237  . OLANZapine (ZYPREXA) tablet 10 mg  10 mg Oral QHS Etta Quill, DO   10 mg at  05/14/16 2237  . polyethylene glycol (MIRALAX / GLYCOLAX) packet 17 g  17 g Oral BID Eugenie Filler, MD   17 g at 05/15/16 1008  . senna-docusate (Senokot-S) tablet 1 tablet  1 tablet Oral BID Eugenie Filler, MD   1 tablet at 05/15/16 1005  . traMADol (ULTRAM) tablet 50 mg  50 mg Oral Q6H PRN Etta Quill, DO         Discharge Medications: Please see discharge summary for a list of discharge medications.  Relevant Imaging Results:  Relevant Lab Results:   Additional Information PEG placed 04/10/16  Carlean Jews, LCSW

## 2016-05-15 NOTE — Progress Notes (Signed)
Initial Nutrition Assessment  DOCUMENTATION CODES:   Severe malnutrition in context of chronic illness  INTERVENTION:   -Recommend continue home TF regimen of Osmolite 1.5, 3 cans daily.  -Provide Prostat liquid protein PO 30 ml BID with meals, each supplement provides 100 kcal, 15 grams protein. -This regimen will provide the following: 1265 kcal (70% of needs), 75g protein (83% of needs) and 543 ml H2O. This allows for additional PO intake to meet needs. -RD to continue to monitor  NUTRITION DIAGNOSIS:   Increased nutrient needs related to cancer and cancer related treatments as evidenced by estimated needs.  GOAL:   Patient will meet greater than or equal to 90% of their needs  MONITOR:   PO intake, Supplement acceptance, Labs, Weight trends, I & O's, TF tolerance  REASON FOR ASSESSMENT:   Consult Assessment of nutrition requirement/status  ASSESSMENT:   65 y.o. female with medical history significant of metastatic lung cancer with brain met.  Patient presented to ED with progressive generalized weakness.  She had been getting progressively weaker over the past weeks-months but this has especially been severe over the past 1 day where she has become too weak to stand up.  Weakness is in both arms and both legs, no numbness, tingling, pain.  No SOB, no vomiting, no fever, has chronic cough that is unchanged.  No recent seizures.  Patient in room with partner at bedside. Pt unable to provide much history, pt's partner was able to provide most history. Pt has continued to consume 2 meals a day (lunch & dinner) and 3 cans of Osmolite 1.5 daily. Meals may consist of chicken salad, a sandwich or steak and gravy. Pt's partner will flush with a "syringe full" after each bolus and pt receives most of her fluid needs orally. However, pt has not been drinking much PO in the last week. Pt had lunch tray in room and consumed fresh fruit, sweet potatoes and about half of a piece of fried  chicken. Pt's partner interested in providing pt with Prostat supplement in addition to her Osmolite 1.5. RD to order.  Per pt's partner, pt has stayed within ~130 lb. Current weight is stable. Pt has been experiencing increased weakness in her legs and arms. Nutrition-Focused physical exam completed. Findings are severe fat depletion, severe muscle depletion, and no edema.   Medications: Trinsicon/Foltrin capsule daily, Folic acid tablet daily, Miralax packet BID, Senokot-S tablet BID Labs reviewed: CBGs: 107 Mg WNL  Diet Order:  Diet regular Room service appropriate? Yes; Fluid consistency: Thin  Skin:  Reviewed, no issues  Last BM:  10/10  Height:   Ht Readings from Last 1 Encounters:  05/11/16 _0  (1.676 m)    Weight:   Wt Readings from Last 1 Encounters:  05/11/16 131 lb 6.3 oz (59.6 kg)    Ideal Body Weight:  59.1 kg  BMI:  Body mass index is 21.21 kg/m.  Estimated Nutritional Needs:   Kcal:  1800-2000  Protein:  90-100g  Fluid:  2L/day  EDUCATION NEEDS:   Education needs addressed  Clayton Bibles, MS, RD, LDN Pager: [phone] After Hours Pager: [phone]

## 2016-05-15 NOTE — NC FL2 (Signed)
[place] LEVEL OF CARE SCREENING TOOL     IDENTIFICATION  Patient Name: Laura Walter Birthdate: 1950/12/21 Sex: female Admission Date (Current Location): 05/11/2016  [facility] and [place] Number:  Herbalist and Address:  [facility],  [place] [address] [place]      Provider Number: [id number]  Attending Physician Name and Address:  Hosie Poisson, MD  Relative Name and Phone Number:       Current Level of Care: Hospital Recommended Level of Care: [place] Prior Approval Number:    Date Approved/Denied:   PASRR Number: [id number]  Discharge Plan: SNF    Current Diagnoses: Patient Active Problem List   Diagnosis Date Noted  . Emesis 05/14/2016  . Metastatic cancer ([place])   . Weakness 05/11/2016  . Aphasia 03/12/2016  . Pain management 03/09/2016  . Dehydration 03/09/2016  . 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 Brain metastases ([place]) 02/14/2016  . Paronychia 11/17/2015  . Paronychia of great toe, right 08/21/2015  . Paronychia of great toe, left 08/21/2015  . Nausea without vomiting 04/24/2015  . Feeding difficulties 04/11/2015  . Encounter for antineoplastic chemotherapy 03/06/2015  . Hypokalemia 02/15/2015  . Protein-calorie malnutrition ([place]) 02/15/2015  . Neutropenic fever ([place]) 02/11/2015  . Neutropenia with fever ([place]) 02/11/2015  . Bicytopenia 02/11/2015  . Diarrhea 02/11/2015  . Malignant neoplasm of upper lobe of right lung ([place]) 01/05/2015  . 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 Metastasis to bone ([place]) 01/03/2015  . Physical exam 10/13/2014  . Osteopenia 10/07/2013  . Benign positional vertigo 05/08/2012  . TINNITUS, LEFT 01/09/2009  . CARDIAC MURMUR, AORTIC 01/05/2007  . MOTION SICKNESS 01/05/2007    Orientation RESPIRATION BLADDER Height & Weight     Self, Time, Situation, Place  Normal Continent Weight: 131 lb 6.3 oz (59.6 kg) Height:  '5\' 6"'$  (167.6 cm)  BEHAVIORAL SYMPTOMS/MOOD  NEUROLOGICAL BOWEL NUTRITION STATUS      Continent Diet (regular)  AMBULATORY STATUS COMMUNICATION OF NEEDS Skin   Limited Assist Verbally Normal                       Personal Care Assistance Level of Assistance  Bathing, Dressing Bathing Assistance: Limited assistance   Dressing Assistance: Limited assistance     Functional Limitations Info  Sight, Hearing, Speech Sight Info: Adequate Hearing Info: Adequate Speech Info: Adequate    SPECIAL CARE FACTORS FREQUENCY  PT (By licensed PT)     PT Frequency: 5 times week              Contractures      Additional Factors Info  Code Status, Allergies Code Status Info: DNR Allergies Info: sulfonamide derivitives           Current Medications (05/15/2016):  This is the current hospital active medication list Current Facility-Administered Medications  Medication Dose Route Frequency Provider Last Rate Last Dose  . 0.9 %  sodium chloride infusion   Intravenous Continuous Eugenie Filler, MD 75 mL/hr at 05/14/16 1418    . feeding supplement (OSMOLITE 1.5 CAL) liquid 237 mL  237 mL Per Tube TID Etta Quill, DO 50 mL/hr at 05/15/16 1006 237 mL at 05/15/16 1006  . feeding supplement (PRO-STAT SUGAR FREE 64) liquid 30 mL  30 mL Oral BID Hosie Poisson, MD   30 mL at 05/15/16 1452  . ferrous [id number] C-folic acid (TRINSICON / FOLTRIN) capsule 1 capsule  1 capsule Oral Daily Sam  Jacubowitz, MD   1 capsule at 05/15/16 1005  . folic acid (FOLVITE) tablet 1 mg  1 mg Oral Daily Etta Quill, DO   1 mg at 05/15/16 1005  . HYDROcodone-acetaminophen (NORCO/VICODIN) 5-325 MG per tablet 1-2 tablet  1-2 tablet Oral Q4H PRN Etta Quill, DO      . levETIRAcetam (KEPPRA) tablet 500 mg  500 mg Oral Daily Etta Quill, DO   500 mg at 05/15/16 1006  . levETIRAcetam (KEPPRA) tablet 750 mg  750 mg Oral QHS Etta Quill, DO   750 mg at 05/14/16 2237  . OLANZapine (ZYPREXA) tablet 10 mg  10 mg Oral QHS Etta Quill,  DO   10 mg at 05/14/16 2237  . polyethylene glycol (MIRALAX / GLYCOLAX) packet 17 g  17 g Oral BID Eugenie Filler, MD   17 g at 05/15/16 1008  . senna-docusate (Senokot-S) tablet 1 tablet  1 tablet Oral BID Eugenie Filler, MD   1 tablet at 05/15/16 1005  . traMADol (ULTRAM) tablet 50 mg  50 mg Oral Q6H PRN Etta Quill, DO         Discharge Medications: Please see discharge summary for a list of discharge medications.  Relevant Imaging Results:  Relevant Lab Results:   Additional Information PEG placed 04/10/16  Carlean Jews, LCSW

## 2016-05-15 NOTE — Progress Notes (Signed)
Physical Therapy Treatment Patient Details Name: Laura Walter MRN: [id number] DOB: 1951-06-07 Today's Date: 05/15/2016    History of Present Illness Pt admitted 2* increasing weakness with L UE and LE more affected than R.  Pt with hx of Lung CA and brain mets    PT Comments    Assisted pt OOB to amb to bathroom required increased assist level.  Noted L side weakness and poor balance with unsteady gait. Pt will need ST Rehab at SNF prior to returning home.  Will consult LPT  Follow Up Recommendations  SNF     Equipment Recommendations       Recommendations for Other Services       Precautions / Restrictions Precautions Precautions: Fall Restrictions Weight Bearing Restrictions: No    Mobility  Bed Mobility Overal bed mobility: Needs Assistance Bed Mobility: Supine to Sit     Supine to sit: Max assist;Total assist     General bed mobility comments: pt demonstarted increased difficulty to self perform with noted L UE weakness and R UE shoulder discomfort with decreased functional use.   Transfers Overall transfer level: Needs assistance Equipment used: Rolling walker (2 wheeled) Transfers: Sit to/from [facility] Sit to Stand: Max assist;+2 safety/equipment;+2 physical assistance Stand pivot transfers: Max assist;+2 safety/equipment;+2 physical assistance       General transfer comment: pt required increased assist to perform.  Severe initial posterior lean/LOB with no correwctive responce/reaction.  75% VC's for proper hand placement and forward lean.  Required MAX assist to control stand to sit.  Pt rigid and demonstatrates poor body awareness to midline.  HIGH FALL RISK.  Ambulation/Gait Ambulation/Gait assistance: Mod assist;Max assist;+2 safety/equipment;+2 physical assistance Ambulation Distance (Feet): 12 Feet (to and from bathroom only) Assistive device: Rolling walker (2 wheeled) Gait Pattern/deviations: Step-to pattern;Step-through  pattern;Decreased step length - right;Decreased step length - left;Narrow base of support;Drifts right/left Gait velocity: decreased   General Gait Details: very limited amb distance and required increased assist.  Poor self correction to center of gravity and severe posterior lean.  HIGH FALL RISK.     Stairs            Wheelchair Mobility    Modified Rankin (Stroke Patients Only)       Balance                                    Cognition Arousal/Alertness: Awake/alert (slow to communicate ) Behavior During Therapy: Flat affect Overall Cognitive Status: Impaired/Different from baseline Area of Impairment: Orientation;Safety/judgement;Problem solving               General Comments: follows all commands with increased time to process and deliver    Exercises      General Comments        Pertinent Vitals/Pain Pain Assessment: Faces Faces Pain Scale: Hurts little more Pain Location: R shoulder Pain Descriptors / Indicators: Constant;Discomfort Pain Intervention(s): Monitored during session;Repositioned    Home Living                      Prior Function            PT Goals (current goals can now be found in the care plan section) Progress towards PT goals: Progressing toward goals    Frequency    Min 3X/week      PT Plan Current plan remains appropriate    Co-evaluation  End of Session Equipment Utilized During Treatment: Gait belt Activity Tolerance: Patient limited by fatigue;Treatment limited secondary to medical complications (Comment) (motor function) Patient left: in chair;with call bell/phone within reach;with chair alarm set;with family/visitor present     Time: 4481-8563 PT Time Calculation (min) (ACUTE ONLY): 29 min  Charges:  $Gait Training: 8-22 mins $Therapeutic Activity: 8-22 mins                    G Codes:      Rica Koyanagi  PTA [facility]  Acute  Rehab Pager      [phone]

## 2016-05-15 NOTE — Consult Note (Signed)
Consultation Note Date: 05/15/2016   Patient Name: Laura Walter  DOB: January 13, 1951  MRN: [id number]  Age / Sex: 65 y.o., female  PCP: Midge Minium, MD Referring Physician: Hosie Poisson, MD  Reason for Consultation: Establishing goals of care  HPI/Patient Profile: 65 y.o. female  with past medical history of metastatic lung cancer admitted on 05/11/2016 with weakness.   Clinical Assessment and Goals of Care:  I met with patient and her partner, [name]. We reviewed a MOST form and discussed how to develop plan of care to focus on continuing therapies that would maximize chance of being well enough to return home and limiting therapies not in line with this goal.  I discussed with Ieesha regarding heroic interventions at the end-of-life. There is agreement this would not be in line with prior expressed wishes for a natural death or be likely to lead to getting well enough to go back home. Family in agreement with changing CODE STATUS to DO NOT RESUSCITATE.  We completed MOST form today. DNR, Limited additional interventions, IVF and ABX if indicated, time limited trial of feeding tube (already in place).  SUMMARY OF RECOMMENDATIONS   - Patient is clear in her goal to continue to pursue any therapies that may add time or quality to her life. - She would like to pursue placement at SNF for rehab.  She is hopeful that she will be strong enough to pursue further disease modifying therapy. - I began discussion with patient and her partner that there will likely come a point where she is not a candidate for further disease modifying therapy.  We discussed that at that point, it does not mean that there is not care left to give, but care should focus on aggressive symptom management through organization such as hospice.  They will continue to rely on Dr. Julien Nordmann to guide them in this decision making.   Code  Status/Advance Care Planning:  DNR   Symptom Management:   Weakness: likely deconditioning.  Recommend rehab to see how much functional status can be regained.  Palliative Prophylaxis:   Frequent Pain Assessment  Psycho-social/Spiritual:   Desire for further Chaplaincy support:no  Additional Recommendations: Caregiving  Support/Resources  Prognosis:   Unable to determine  Discharge Planning: [place] for rehab with Palliative care service follow-up      Primary Diagnoses: Present on Admission: . Malignant neoplasm of upper lobe of right lung ([place]) . Brain metastases ([place]) . Metastatic cancer ([place]) . Dehydration   I have reviewed the medical record, interviewed the patient and family, and examined the patient. The following aspects are pertinent.  Past Medical History:  Diagnosis Date  . Allergy    seasonal  . Bone cancer ([place]) 01/10/15 PET   lytic lesion lt 1st rib,T9,L3 Lt acetabulum  . Brain cancer ([place]) 01/13/15   MRI multiple small brain mets 10 individual lesions  . Bronchitis   . H/O reactive hypoglycemia   . IBS (irritable bowel syndrome)   . Lung cancer ([place])  stageIV nscca rul  . Osteopenia   . Paronychia 11/17/2015  . Pneumonia 2009 & 2011  . S/P radiation therapy completed 02/08/15   whole brain ,spine  . S/P radiation therapy completed 11/27/15   lung   Social History   Social History  . Marital status: Single    Spouse name: N/A  . Number of children: 0  . Years of education: Master's   Occupational History  . N/A    Social History Main Topics  . Smoking status: Never Smoker  . Smokeless tobacco: Never Used  . Alcohol use Yes     Comment: occasionally rare wine  . Drug use: No  . Sexual activity: No   Other Topics Concern  . None   Social History Narrative   Lives at home w/ her friend, [name]   Right-handed   Caffeine: rare   Family History  Problem Relation Age of Onset  . Transient ischemic attack Mother    . Mental illness Mother     Dementia  . Diabetes Father   . Heart attack Father 13  . Heart attack Maternal Grandfather     in 40s  . Stomach cancer Maternal Aunt   . Heart attack Maternal Aunt     in 36s  . Diabetes Paternal Uncle     X 3   Scheduled Meds: . feeding supplement (OSMOLITE 1.5 CAL)  237 mL Per Tube TID  . feeding supplement (PRO-STAT SUGAR FREE 64)  30 mL Oral BID  . ferrous [id number] C-folic acid  1 capsule Oral Daily  . folic acid  1 mg Oral Daily  . levETIRAcetam  500 mg Oral Daily  . levETIRAcetam  750 mg Oral QHS  . OLANZapine  10 mg Oral QHS  . polyethylene glycol  17 g Oral BID  . senna-docusate  1 tablet Oral BID   Continuous Infusions: . sodium chloride 1,000 mL (05/15/16 1454)   PRN Meds:.HYDROcodone-acetaminophen, traMADol Medications Prior to Admission:  Prior to Admission medications   Medication Sig Start Date End Date Taking? Authorizing Provider  FeFum-FePoly-FA-B Cmp-C-Biot (INTEGRA PLUS) CAPS Take 1 capsule by mouth every morning. 02/19/16  Yes Curt Bears, MD  HYDROcodone-acetaminophen (NORCO) 5-325 MG tablet Take 1-2 tablets by mouth every 4 (four) hours as needed for moderate pain. 02/14/16  Yes Hayden Pedro, PA-C  levETIRAcetam (KEPPRA) 500 MG tablet 1 tablet in the morning and 1.5 tablets in the evening Patient taking differently: Take 500-750 mg by mouth 2 (two) times daily. 1 tablet in the morning and 1.5 tablets in the evening 04/30/16  Yes Kathrynn Ducking, MD  Nutritional Supplements (FEEDING SUPPLEMENT, OSMOLITE 1.5 CAL,) LIQD Place 237 mLs into feeding tube 4 (four) times daily. Patient taking differently: Place 237 mLs into feeding tube 3 (three) times daily.  04/14/15  Yes Carol Ada, MD  OLANZapine (ZYPREXA) 10 MG tablet TAKE 1 TABLET (10 MG TOTAL) BY MOUTH AT BEDTIME. (INS PAYS 12/13/2015) 04/22/16  Yes Curt Bears, MD  traMADol (ULTRAM) 50 MG tablet Take 1 tablet (50 mg total) by mouth every 6 (six)  hours as needed. Patient taking differently: Take 50 mg by mouth every 6 (six) hours as needed for moderate pain or severe pain.  02/19/16  Yes Curt Bears, MD  folic acid (FOLVITE) 1 MG tablet Take 1 tablet (1 mg total) by mouth daily. 05/13/16   Curt Bears, MD   Allergies  Allergen Reactions  . Sulfonamide Derivatives Rash    Childhood reaction  Review of Systems  Constitutional: Positive for activity change and fatigue.  Neurological: Positive for weakness and light-headedness.  Psychiatric/Behavioral: Positive for sleep disturbance.  All other systems reviewed and are negative.   Physical Exam  General: Alert, awake, in no acute distress. Frail. HEENT: No bruits, no goiter, no JVD Heart: Regular rate and rhythm. No murmur appreciated. Lungs: Good air movement, clear Abdomen: Soft, nontender, nondistended, positive bowel sounds.  Ext: No significant edema Skin: Warm and dry  Vital Signs: BP 135/80   Pulse (!) 108   Temp 97.7 F (36.5 C) (Oral)   Resp 16   Ht 5' 6"  (1.676 m)   Wt 59.6 kg (131 lb 6.3 oz)   SpO2 96%   BMI 21.21 kg/m  Pain Assessment: No/denies pain   Pain Score: 0-No pain   SpO2: SpO2: 96 % O2 Device:SpO2: 96 % O2 Flow Rate: .   IO: Intake/output summary:  Intake/Output Summary (Last 24 hours) at 05/15/16 2146 Last data filed at 05/15/16 1430  Gross per 24 hour  Intake              957 ml  Output                0 ml  Net              957 ml    LBM: Last BM Date: 05/14/16 Baseline Weight: Weight: 59.6 kg (131 lb 6.3 oz) Most recent weight: Weight: 59.6 kg (131 lb 6.3 oz)     Palliative Assessment/Data:   Flowsheet Rows   Flowsheet Row Most Recent Value  Intake Tab  Referral Department  Hospitalist  Unit at Time of Referral  Oncology Unit  Palliative Care Primary Diagnosis  Cancer  Date Notified  05/13/16  Palliative Care Type  New Palliative care  Reason for referral  Clarify Goals of Care  Date of Admission  05/11/16    Date first seen by Palliative Care  05/14/16  # of days Palliative referral response time  1 Day(s)  # of days IP prior to Palliative referral  2  Clinical Assessment  Palliative Performance Scale Score  40%  Pain Max last 24 hours  6  Pain Min Last 24 hours  0  Psychosocial & Spiritual Assessment  Palliative Care Outcomes  Patient/Family meeting held?  Yes  Who was at the meeting?  Patient, Teodoro Spray (partner/HCPOA)  Palliative Care Outcomes  Clarified goals of care, Changed CPR status, Completed durable DNR, Provided advance care planning      Time In: 1240 Time Out: 1340 Time Total: 60 Greater than 50%  of this time was spent counseling and coordinating care related to the above assessment and plan.  Signed by: Micheline Rough, MD   Please contact Palliative Medicine Team phone at [phone] for questions and concerns.  For individual provider: See Shea Evans

## 2016-05-15 NOTE — Telephone Encounter (Signed)
I spoke with Laura Walter this am to review again the recommendations to repeat her next brain MRI in November at her scheduled surveillance scan, and that Dr. Lisbeth Renshaw did not feel that her weakness was due to her 2 mm brain lesion, rather due to deconditioning and failure to thrive. Laura Walter was encouraged to discuss home health needs to include nutrition as well.

## 2016-05-15 NOTE — Progress Notes (Addendum)
PROGRESS NOTE    Laura Walter  ZOX:[id number] DOB: [birth year] DOA: 05/11/2016 PCP: [name], MD    Brief Narrative:   Laura Walter is a 65 y.o. female with medical history significant of metastatic lung cancer with brain met.  Patient presented to ED with progressive generalized weakness.  She had been getting progressively weaker over the past weeks-months but this has especially been severe over the past 1 day where she has become too weak to stand up.  Weakness is in both arms and both legs, no numbness, tingling, pain.  No SOB, no vomiting, no fever, has chronic cough that is unchanged.  No recent seizures.  ED Course: whole body CT scan unremarkable other than a tiny new nodule behind left kidney, some non-specific haziness next to a lung lesion.  UA negative, no SIRS.  BMP unremarkable.  CBC shows improvement of anemia post transfusion.   Assessment & Plan:   Principal Problem:   Weakness Active Problems:   Malignant neoplasm of upper lobe of right lung (HCC)   Brain metastases (HCC)   Dehydration   Metastatic cancer (HCC)   Emesis  #1 generalized weakness Patient with generalized weakness with left lower extremity weakness greater than right lower extremity weakness. Likely secondary to new 2 mm metastatic lesion in the cerebellum noted on MRI of the brain and debility VS deconditioning and failure to thrive. Patient with no signs or symptoms of infection. CT chest abdomen and pelvis unremarkable other than a tiny new nodule behind left kidney.  Pt reports worsening weakness and dizziness on standing. Also reports nausea, slightly better than yesterday, would recommend continue IV fluids for another 24 hours prior to discharge. her po intake is minimal, suspect she would be readmitted if discharged without appropriate hydration. recommend  PT/OT evaluation for SNF placement. Pt and family open to going to rehabilitation.   #2 dehydration improving Would required another 24  hours of IV hydration and get orthostatic vital signs in am as she rpeorts dizziness and unable to ambulate .  #3 metastatic non-small cell lung cancer CT chest, abdomen, pelvis done was unremarkable other than a tiny new nodule behind the left kidney. Patient had presented with generalized weakness and a such MRI of the brain was obtained showed a new 2 mm metastatic lesion in the cerebellum. Continued on Keppra for seizure prophylaxis. Patient with poor performance score. Oncology has been consulted. Radiation oncology has also been consulted. Patient is to follow-up in the outpatient setting with oncology and radiation oncology.   #4 pancytopenia Likely secondary to metastatic non-small cell lung cancer. Hemoglobin stable at 8.4, platelets around 54,000 and normal white count. Patient has been seen by hematology/oncology. Patient will follow-up in the outpatient setting.  #5 emesis/constipation Patient with complaints of emesis. No BM for 6 days, had one BM last night, continues to have nausea, ordered IV anti emetics.  Acute abdominal series does not show any obstruction. Place on a soapsuds enema. Placed  on MiraLAX twice daily as well as Senokot-S for bowel regimen.   FTT and deconditioning: PT eval requested.     DVT prophylaxis: SCDs Code Status: Full Family Communication: Updated patient and significant other and friend at bedside. Disposition Plan: pending PT evaluation.   Consultants:   Oncology: Dr. Lorna Few 05/13/2016  Radiation oncology: Dr Lisbeth Renshaw 05/13/2016  Palliative care consult.   Procedures:   MRI brain 05/12/2016  Chest x-ray 05/11/2016  CT head/CT chest/CT abdomen and pelvis 05/11/2016  Antimicrobials:  None   Subjective: Patient sitting up in bed and dozing in the middle of the conversation.   Objective: Vitals:   05/14/16 0409 05/14/16 1340 05/14/16 2146 05/15/16 0500  BP: (!) 147/91 134/75 127/76 130/87  Pulse: (!) 107 (!) 107 (!)  112 (!) 108  Resp: 16 16 20 18   Temp: 99.4 F (37.4 C) 98.2 F (36.8 C) 98.3 F (36.8 C) 97.7 F (36.5 C)  TempSrc: Oral Oral Oral Oral  SpO2: 97% 96% 97% 96%  Weight:      Height:        Intake/Output Summary (Last 24 hours) at 05/15/16 1314 Last data filed at 05/14/16 1929  Gross per 24 hour  Intake              180 ml  Output                0 ml  Net              180 ml   Filed Weights   05/11/16 2230  Weight: 59.6 kg (131 lb 6.3 oz)    Examination:  General exam: Appears calm and comfortable.Dry mucous membranes. Respiratory system: Clear to auscultation. Respiratory effort normal. Cardiovascular system: S1 & S2 heard, RRR. No JVD, murmurs, rubs, gallops or clicks. No pedal edema. Gastrointestinal system: Abdomen is nondistended, soft and nontender. No organomegaly or masses felt. Normal bowel sounds heard. Central nervous system: Alert and oriented. No focal neurological deficits. Extremities: 5/5 BUE strength. Diminished lower extremity strength. Skin: No rashes, lesions or ulcers     Data Reviewed: I have personally reviewed following labs and imaging studies  CBC:  Recent Labs Lab 05/11/16 1210 05/12/16 0418 05/13/16 0340 05/14/16 0341 05/15/16 0402  WBC 5.1 3.8* 3.8* 4.0 5.9  NEUTROABS 4.1  --   --  3.3  --   HGB 9.8* 9.0* 8.6* 8.6* 8.4*  HCT 29.2* 27.0* 25.9* 25.7* 24.9*  MCV 93.0 95.4 95.2 96.3 92.6  PLT 50* 47* 47* 56* 54*   Basic Metabolic Panel:  Recent Labs Lab 05/11/16 1210 05/12/16 0418 05/13/16 0340 05/14/16 0341 05/15/16 0402  NA 138 137 138 138 136  K 3.5 3.7 3.5 3.8 3.6  CL 107 108 109 111 108  CO2 23 21* 22 21* 21*  GLUCOSE 135* 97 96 105* 105*  BUN 27* 19 16 15 13   CREATININE 0.77 0.72 0.63 0.57 0.58  CALCIUM 8.1* 8.0* 7.8* 8.0* 8.1*  MG  --   --   --   --  1.8   GFR: Estimated Creatinine Clearance: 68.3 mL/min (by C-G formula based on SCr of 0.58 mg/dL). Liver Function Tests:  Recent Labs Lab 05/11/16 1210  AST  33  ALT 35  ALKPHOS 67  BILITOT 0.5  PROT 6.5  ALBUMIN 2.7*   No results for input(s): LIPASE, AMYLASE in the last 168 hours. No results for input(s): AMMONIA in the last 168 hours. Coagulation Profile: No results for input(s): INR, PROTIME in the last 168 hours. Cardiac Enzymes: No results for input(s): CKTOTAL, CKMB, CKMBINDEX, TROPONINI in the last 168 hours. BNP (last 3 results) No results for input(s): PROBNP in the last 8760 hours. HbA1C: No results for input(s): HGBA1C in the last 72 hours. CBG:  Recent Labs Lab 05/14/16 1654  GLUCAP 107*   Lipid Profile: No results for input(s): CHOL, HDL, LDLCALC, TRIG, CHOLHDL, LDLDIRECT in the last 72 hours. Thyroid Function Tests: No results for input(s): TSH, T4TOTAL, FREET4, T3FREE, THYROIDAB in the  last 72 hours. Anemia Panel: No results for input(s): VITAMINB12, FOLATE, FERRITIN, TIBC, IRON, RETICCTPCT in the last 72 hours. Sepsis Labs:  Recent Labs Lab 05/11/16 1259 05/11/16 1557  LATICACIDVEN 1.34 0.69    Recent Results (from the past 240 hour(s))  Urine culture     Status: None   Collection Time: 05/11/16 12:15 PM  Result Value Ref Range Status   Specimen Description URINE, RANDOM  Final   Special Requests NONE  Final   Culture NO GROWTH Performed at [facility]   Final   Report Status 05/12/2016 FINAL  Final  Blood Culture (routine x 2)     Status: None (Preliminary result)   Collection Time: 05/11/16 12:39 PM  Result Value Ref Range Status   Specimen Description BLOOD LEFT ANTECUBITAL  Final   Special Requests BOTTLES DRAWN AEROBIC AND ANAEROBIC 5CC  Final   Culture   Final    NO GROWTH 3 DAYS Performed at [facility]    Report Status PENDING  Incomplete  Blood Culture (routine x 2)     Status: None (Preliminary result)   Collection Time: 05/11/16 12:47 PM  Result Value Ref Range Status   Specimen Description BLOOD LEFT HAND  Final   Special Requests IN PEDIATRIC BOTTLE [place]  Final    Culture   Final    NO GROWTH 3 DAYS Performed at [facility]    Report Status PENDING  Incomplete         Radiology Studies: Dg Abd Acute W/chest  Result Date: 05/14/2016 CLINICAL DATA:  Weakness and emesis.  Pneumonia.  Lung cancer. EXAM: DG ABDOMEN ACUTE W/ 1V CHEST COMPARISON:  05/11/2016 and CT scan from 05/11/2016. FINDINGS: Right apical lung mass. Retrocardiac density on the right similar to prior. Volume loss on the right. Heart size within normal limits. T9 compression fracture. Left-side-down decubitus view the abdomen demonstrates no free intraperitoneal gas. There is contrast medium within the colon. No significant abnormal air-fluid levels are identified. There is contrast medium in the distal small bowel as well. Contrast is present in the rectum. Left bony pelvic deformities from old healed fractures. Percutaneous gastrostomy tube noted. Mild levoconvex rotary scoliosis of the lumbar spine. Sclerosis at the L3 vertebral level compatible with known osseous metastatic disease in this vicinity. IMPRESSION: 1. Right apical lung mass with stable right retrocardiac density. 2. No appreciable change in the osseous metastatic disease. 3. No free air identified. Contrast medium in the bowel. No dilated bowel noted. Electronically Signed   By: Van Clines M.D.   On: 05/14/2016 15:32        Scheduled Meds: . feeding supplement (OSMOLITE 1.5 CAL)  237 mL Per Tube TID  . ferrous [id number] C-folic acid  1 capsule Oral Daily  . folic acid  1 mg Oral Daily  . levETIRAcetam  500 mg Oral Daily  . levETIRAcetam  750 mg Oral QHS  . OLANZapine  10 mg Oral QHS  . polyethylene glycol  17 g Oral BID  . senna-docusate  1 tablet Oral BID   Continuous Infusions: . sodium chloride 75 mL/hr at 05/14/16 1418     LOS: 1 day    Time spent: 44 minutes    Mccauley Diehl, MD [facility] Pager [phone]  If 7PM-7AM, please contact  night-coverage [email or web address] Password [email or web address] 05/15/2016, 1:14 PM

## 2016-05-16 ENCOUNTER — Encounter: Payer: Self-pay | Admitting: *Deleted

## 2016-05-16 DIAGNOSIS — C799 Secondary malignant neoplasm of unspecified site: Secondary | ICD-10-CM

## 2016-05-16 DIAGNOSIS — E43 Unspecified severe protein-calorie malnutrition: Secondary | ICD-10-CM

## 2016-05-16 LAB — CULTURE, BLOOD (ROUTINE X 2)
CULTURE: NO GROWTH
Culture: NO GROWTH

## 2016-05-16 MED ORDER — POLYETHYLENE GLYCOL 3350 17 G PO PACK: 17.0000 g | PACK | Freq: Every day | ORAL | 0 refills | Status: AC | PRN

## 2016-05-16 MED ORDER — PRO-STAT SUGAR FREE PO LIQD: 30.0000 mL | Freq: Two times a day (BID) | ORAL | 0 refills | Status: AC

## 2016-05-16 MED ORDER — SENNOSIDES-DOCUSATE SODIUM 8.6-50 MG PO TABS: 1.0000 | ORAL_TABLET | Freq: Two times a day (BID) | ORAL | 0 refills | Status: DC

## 2016-05-16 NOTE — Progress Notes (Signed)
LCSW met with patient and daughter at the bedside. Spoke with family with CM.  Family aware of authorization and barriers and refuses placement at [facility]. Family is aware at this time, patient will need to go home and await for authorization from other options for SNF. LCSW has faxed out to facilities of family's choice and they are aware that barrier remains to be insurance and patient is medically stable to discharge.  HH will be arranged for patient who is already active and we will add a Education officer, museum. LCSW will follow up on the bed offers and begin auth for patient and have hh sw follow up with placement.  Lane Hacker, MSW Clinical Social Work: [employer] Coverage for :  [phone]

## 2016-05-16 NOTE — Progress Notes (Signed)
Daily Progress Note   Patient Name: Laura Walter       Date: 05/16/2016 DOB: 1951-01-25  Age: 65 y.o. MRN#: [id number] Attending Physician: Hosie Poisson, MD Primary Care Physician: [name], MD Admit Date: 05/11/2016  Reason for Consultation/Follow-up: Disposition and Establishing goals of care  Subjective: Met today with patient and her partner, [name].  Laura Walter slept through most of encounter.  [name] reports that they remain hopeful to go to rehab, but she was not happy with facility after touring bed offer from last evening.  They have been working with SW to determine options for rehab and note plan to go home rather than facility where they are not comfortable.  Also discussed with [name] (outside of the room per her request as patient was sleeping) regarding disease progression and that we may be reaching the point where further disease modifying therapy will not result in addition of quality time to her life.  She expressed understanding this and states that [name] has stated she wants to continue therapy if she is a candidate.  They will continue to discuss this with Dr. Julien Nordmann at f/u.  Length of Stay: 2  Current Medications: Scheduled Meds:  . feeding supplement (OSMOLITE 1.5 CAL)  237 mL Per Tube TID  . feeding supplement (PRO-STAT SUGAR FREE 64)  30 mL Oral BID  . ferrous [id number] C-folic acid  1 capsule Oral Daily  . folic acid  1 mg Oral Daily  . levETIRAcetam  500 mg Oral Daily  . levETIRAcetam  750 mg Oral QHS  . OLANZapine  10 mg Oral QHS  . polyethylene glycol  17 g Oral BID  . senna-docusate  1 tablet Oral BID    Continuous Infusions: . sodium chloride 1,000 mL (05/15/16 1454)    PRN Meds: HYDROcodone-acetaminophen, ondansetron (ZOFRAN) IV,  traMADol  Physical Exam         General: Alert, awake, in no acute distress. Frail. HEENT: No bruits, no goiter, no JVD Heart: Regular rate and rhythm. No murmur appreciated. Lungs: Good air movement, clear Abdomen: Soft, nontender, nondistended, positive bowel sounds.  Ext: No significant edema Skin: Warm and dry  Vital Signs: BP 126/82 (BP Location: Right Arm)   Pulse (!) 103   Temp 98.7 F (37.1 C) (Axillary)   Resp 20   Ht  5' 6"  (1.676 m)   Wt 59.6 kg (131 lb 6.3 oz)   SpO2 98%   BMI 21.21 kg/m  SpO2: SpO2: 98 % O2 Device: O2 Device: Not Delivered O2 Flow Rate:    Intake/output summary:  Intake/Output Summary (Last 24 hours) at 05/16/16 1452 Last data filed at 05/16/16 1020  Gross per 24 hour  Intake                0 ml  Output              200 ml  Net             -200 ml   LBM: Last BM Date: 05/14/16 Baseline Weight: Weight: 59.6 kg (131 lb 6.3 oz) Most recent weight: Weight: 59.6 kg (131 lb 6.3 oz)       Palliative Assessment/Data:    Flowsheet Rows   Flowsheet Row Most Recent Value  Intake Tab  Referral Department  Hospitalist  Unit at Time of Referral  Oncology Unit  Palliative Care Primary Diagnosis  Cancer  Date Notified  05/13/16  Palliative Care Type  New Palliative care  Reason for referral  Clarify Goals of Care  Date of Admission  05/11/16  Date first seen by Palliative Care  05/14/16  # of days Palliative referral response time  1 Day(s)  # of days IP prior to Palliative referral  2  Clinical Assessment  Palliative Performance Scale Score  40%  Pain Max last 24 hours  6  Pain Min Last 24 hours  0  Psychosocial & Spiritual Assessment  Palliative Care Outcomes  Patient/Family meeting held?  Yes  Who was at the meeting?  Patient, Teodoro Spray (partner/HCPOA)  Palliative Care Outcomes  Clarified goals of care, Changed CPR status, Completed durable DNR, Provided advance care planning      Patient Active Problem List   Diagnosis Date  Noted  . Protein-calorie malnutrition, severe 05/15/2016  . Emesis 05/14/2016  . Metastatic cancer ([place])   . Weakness 05/11/2016  . Aphasia 03/12/2016  . Pain management 03/09/2016  . Dehydration 03/09/2016  . 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 Brain metastases ([place]) 02/14/2016  . Paronychia 11/17/2015  . Paronychia of great toe, right 08/21/2015  . Paronychia of great toe, left 08/21/2015  . Nausea without vomiting 04/24/2015  . Feeding difficulties 04/11/2015  . Encounter for antineoplastic chemotherapy 03/06/2015  . Hypokalemia 02/15/2015  . Protein-calorie malnutrition ([place]) 02/15/2015  . Neutropenic fever ([place]) 02/11/2015  . Neutropenia with fever ([place]) 02/11/2015  . Bicytopenia 02/11/2015  . Diarrhea 02/11/2015  . Malignant neoplasm of upper lobe of right lung ([place]) 01/05/2015  . 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 Metastasis to bone ([place]) 01/03/2015  . Physical exam 10/13/2014  . Osteopenia 10/07/2013  . Benign positional vertigo 05/08/2012  . TINNITUS, LEFT 01/09/2009  . CARDIAC MURMUR, AORTIC 01/05/2007  . MOTION SICKNESS 01/05/2007    Palliative Care Assessment & Plan   Recommendations/Plan: - Patient is clear in her goal to continue to pursue any therapies that may add time or quality to her life. - She would like to pursue placement at SNF for rehab.  She is hopeful that she will become strong enough to pursue further disease modifying therapy.  SW facilitating options for this. - MOST form completed and on chart.  Copy of HCPOA on chart. [name] requested I reach out to radiation oncology as she was hoping to talk about their thoughts on her weakness again.  I called and discussed with Worthy Flank from rad onc and she will d/w Dr. Lisbeth Renshaw and touch base with family.  Code Status:    Code Status Orders        Start     Ordered   05/15/16 1250  Do not attempt resuscitation (DNR)  Continuous    Question Answer Comment  In the event of cardiac or  respiratory ARREST Do not call a "code blue"   In the event of cardiac or respiratory ARREST Do not perform Intubation, CPR, defibrillation or ACLS   In the event of cardiac or respiratory ARREST Use medication by any route, position, wound care, and other measures to relive pain and suffering. May use oxygen, suction and manual treatment of airway obstruction as needed for comfort.      05/15/16 1249    Code Status History    Date Active Date Inactive Code Status Order ID Comments User Context   05/11/2016  9:25 PM 05/15/2016 12:49 PM Full Code [id number]  Etta Quill, DO ED   02/11/2015  5:58 AM 02/15/2015  4:40 PM Full Code [id number]  Lavina Hamman, MD Inpatient   01/24/2015 10:56 AM 01/25/2015  3:19 AM Full Code [id number]  Greggory Keen, MD HOV   01/12/2015  1:10 PM 01/13/2015  3:18 AM Full Code [id number]  Sandi Mariscal, MD HOV    Advance Directive Documentation   Flowsheet Row Most Recent Value  Type of Advance Directive  Healthcare Power of Attorney  Pre-existing out of facility DNR order (yellow form or pink MOST form)  No data  "MOST" Form in Place?  No data       Prognosis:   Unable to determine, however her expected prognosis without further treatment, if her disease follows its natural course, would likely be less than 6 months and she should qualify for hospice support if so desired in the future.  Discharge Planning:  [place] for rehab with Palliative care service follow-up  Care plan was discussed with patient, [name], and Worthy Flank from radiation oncology  Thank you for allowing the Palliative Medicine Team to assist in the care of this patient.   Time In: 1300 Time Out: 1340 Total Time 40 Prolonged Time Billed No      Greater than 50%  of this time was spent counseling and coordinating care related to the above assessment and plan.  Micheline Rough, MD  Please contact Palliative Medicine Team phone at [phone] for questions and concerns.

## 2016-05-16 NOTE — Discharge Summary (Signed)
Physician Discharge Summary  Laura Walter:[id number] DOB: 10/23/1950 DOA: 05/11/2016  PCP: [name], MD  Admit date: 05/11/2016 Discharge date: 05/16/2016  Admitted From: Home Disposition:  Home.   Recommendations for Outpatient Follow-up:  1. Follow up with PCP in 1-2 weeks 2. Please obtain BMP/CBC in one week 3. Please follow up on the following pending results:   Discharge Condition: stable. CODE STATUS:DNR. Diet recommendation: regular.   Brief/Interim Summary: Laura Walter a 65 y.o.femalewith medical history significant of metastatic lung cancer with brain met. Patient presented to ED with progressive generalized weakness. She had been getting progressively weaker over the past weeks-months but this has especially been severe over the past 1 day where she has become too weak to stand up. Weakness is in both arms and both legs, no numbness, tingling, pain. No SOB, no vomiting, no fever, has chronic cough that is unchanged. No recent seizures.  ED Course:whole body CT scan unremarkable other than a tiny new nodule behind left kidney, some non-specific haziness next to a lung lesion. UA negative, no SIRS. BMP unremarkable. CBC shows improvement of anemia post transfusion.  Discharge Diagnoses:  Principal Problem:   Weakness Active Problems:   Malignant neoplasm of upper lobe of right lung (HCC)   Brain metastases (HCC)   Dehydration   Metastatic cancer (HCC)   Emesis   Protein-calorie malnutrition, severe  #1 generalized weakness Patient with generalized weakness with left lower extremity weakness greater than right lower extremity weakness. Likely secondary to new 2 mm metastatic lesion in the cerebellum noted on MRI of the brain and debility VS deconditioning and failure to thrive. Patient with no signs or symptoms of infection. CT chest abdomen and pelvis unremarkable other than a tiny new nodule behind left kidney.    #2  dehydration improving Resolved.   #3 metastatic non-small cell lung cancer CT chest, abdomen, pelvis done was unremarkable other than a tiny new nodule behind the left kidney. Patient had presented with generalized weakness and a such MRI of the brain was obtained showed a new 2 mm metastatic lesion in the cerebellum. Continued on Keppra for seizure prophylaxis. Patient with poor performance score. Oncology has been consulted. Radiation oncology has also been consulted. Patient is to follow-up in the outpatient setting with oncology and radiation oncology.   #4 pancytopenia Likely secondary to metastatic non-small cell lung cancer. Hemoglobin stable at 8.4, platelets around 54,000 and normal white count. Patient has been seen by hematology/oncology. Patient will follow-up in the outpatient setting.  #5 emesis/constipation Patient with complaints of emesis. Constipation resolved.  Acute abdominal series does not show any obstruction.  Placed  on MiraLAX twice daily as well as Senokot-S for bowel regimen.   FTT and deconditioning: PT eval requested.     Discharge Instructions  Discharge Instructions    DME Hospital bed    Complete by:  As directed    The above medical condition requires:  Patient requires the ability to reposition frequently   Head must be elevated greater than:  30 degrees   Bed type:  Semi-electric   Trapeze Bar:  Yes   Diet general    Complete by:  As directed    Discharge instructions    Complete by:  As directed    Follow up with PCP in one week.  Follow up with oncology as recommended.   Face-to-face encounter (required for Medicare/Medicaid patients)    Complete by:  As directed    I Mesner, Corene Cornea certify  that this patient is under my care and that I, or a nurse practitioner or physician's assistant working with me, had a face-to-face encounter that meets the physician face-to-face encounter requirements with this patient on 05/11/2016. The encounter  with the patient was in whole, or in part for the following medical condition(s) which is the primary reason for home health care (List medical condition): metastatic cancer with worsening weakness. Further orders per PCP.   The encounter with the patient was in whole, or in part, for the following medical condition, which is the primary reason for home health care:  weak, worsening cancer   I certify that, based on my findings, the following services are medically necessary home health services:   Nursing Physical therapy     Reason for Medically Necessary Home Health Services:   Skilled Nursing- Change/Decline in Patient Status Therapy- Therapeutic Exercises to Increase Strength and Endurance     My clinical findings support the need for the above services:  Can transfer bed to chair only   Further, I certify that my clinical findings support that this patient is homebound due to:  Ambulates short distances less than 300 feet   Home Health    Complete by:  As directed    To provide the following care/treatments:   PT OT RN         Medication List    TAKE these medications   feeding supplement (OSMOLITE 1.5 CAL) Liqd Place 237 mLs into feeding tube 4 (four) times daily. What changed:  when to take this   feeding supplement (PRO-STAT SUGAR FREE 64) Liqd Take 30 mLs by mouth 2 (two) times daily.   folic acid 1 MG tablet Commonly known as:  FOLVITE Take 1 tablet (1 mg total) by mouth daily. What changed:  when to take this   HYDROcodone-acetaminophen 5-325 MG tablet Commonly known as:  NORCO Take 1-2 tablets by mouth every 4 (four) hours as needed for moderate pain.   INTEGRA PLUS Caps Take 1 capsule by mouth every morning.   levETIRAcetam 500 MG tablet Commonly known as:  KEPPRA 1 tablet in the morning and 1.5 tablets in the evening What changed:  how much to take  how to take this  when to take this  additional instructions   OLANZapine 10 MG tablet Commonly  known as:  ZYPREXA TAKE 1 TABLET (10 MG TOTAL) BY MOUTH AT BEDTIME. (INS PAYS 12/13/2015)   polyethylene glycol packet Commonly known as:  MIRALAX / GLYCOLAX Take 17 g by mouth daily as needed.   senna-docusate 8.6-50 MG tablet Commonly known as:  Senokot-S Take 1 tablet by mouth 2 (two) times daily.   traMADol 50 MG tablet Commonly known as:  ULTRAM Take 1 tablet (50 mg total) by mouth every 6 (six) hours as needed. What changed:  reasons to take this      Follow-up Information    [name], MD Follow up in 3 day(s).   Specialty:  Family Medicine Why:  reevaluation Contact information: [address] [place] [address] [place] DEPT.   Specialty:  Emergency Medicine Why:  If symptoms worsen Contact information: [place] [id number] [place] .   Why:  Hospital Bed has been ordered. Contact information: [address] [place] [address] [phone]  [place] .   Why:  HHRN, HHPT, [facility] Nurse Aide Contact information: [place] [address] [phone]          Allergies  Allergen Reactions  . Sulfonamide Derivatives Rash    Childhood reaction    Consultations:  Palliative care consult.   Oncology  Radiation oncology.   Procedures/Studies: Ct Head W & Wo Contrast  Result Date: 05/11/2016 CLINICAL DATA:  Follow-up lung cancer. EXAM: CT HEAD WITHOUT AND WITH CONTRAST CT CHEST, ABDOMEN AND PELVIS WITH CONTRAST TECHNIQUE: Contiguous axial images were obtained from the base of the skull through the vertex without and with intravenous contrast. Multidetector CT imaging of the chest, abdomen and pelvis was performed following the standard protocol during bolus administration of intravenous contrast. CONTRAST:  164m ISOVUE-300 IOPAMIDOL  (ISOVUE-300) INJECTION 61%, 338mISOVUE-300 IOPAMIDOL (ISOVUE-300) INJECTION 61%<Contrast>10073mSOVUE-300 IOPAMIDOL (ISOVUE-300) INJECTION 61%, 93m80mOVUE-300 IOPAMIDOL (ISOVUE-300) INJECTION 61% COMPARISON:  CT scan of the brain March 09, 2016, MRI brain March 10, 2016, and CT of the chest/abdomen/ pelvis January 15, 2016 FINDINGS: CT HEAD FINDINGS Brain: No subdural, epidural, or subarachnoid. High attenuation in the right basal ganglia on series 2, image 12 is consistent with calcification. No intraparenchymal hemorrhage is identified. White matter changes are stable and severe. No acute cortical ischemia or infarct. The cerebellum and brainstem are normal. The basal cisterns are unremarkable. No midline shift. No enhancing metastatic disease identified. Vascular: No hyperdense vessel or unexpected calcification. Skull: Normal. Negative for fracture or focal lesion. Sinuses/Orbits: No acute finding. Other: No other abnormalities. CT CHEST FINDINGS Cardiovascular: No significant vascular findings. Normal heart size. Mediastinum/Nodes: No adenopathy is seen within the chest. The thyroid is normal in appearance. The thoracic aorta is non aneurysmal. A small amount of pericardial fluid is stable. The heart size is within normal limits. Mild coronary artery calcifications are noted. The central great vessels otherwise normal. Lungs/Pleura: The central airways are normal. No pneumothorax. The timing of the contrast is suboptimal limiting evaluation of the patient's known lung malignancies. The 2 separate lesions in the right upper chest on the previous study are difficult to differentiate on today's study. Additionally, there is increased infiltrate surrounding the known malignancy. The subpleural nodule seen on the previous study is difficult to measure due to timing of contrast. However, it appears to be similar in size measuring 2.0 x 2.7 cm today versus 2.3 x 2.7 cm previously. The larger slightly more inferior mass  is also difficult to measure but measures approximately 3.0 by 2.8 cm today versus 3.5 x 3.2 cm previously. There is increased infiltrate surrounding this mass and extending towards the suprahilar region on the right. By report, the patient's last radiation therapy to the lung was April 2017. The infiltrate in the medial right lower lobe is likely unchanged but obscured by an enlarging right-sided pleural effusion and atelectasis. The right-sided pleural effusion is larger in the interval. There is a small left effusion now present as well. There is interlobular septal thickening in the right upper lobe which is mild but new. No other changes on the right. There is atelectasis in the left base. No suspicious nodules or masses are seen in the left lung. Musculoskeletal: The mixed lytic and sclerotic lesion with pathologic fracture in T9 is stable. A sclerotic focus the anterior aspect of C7 is stable. A sclerotic focus near the superior endplate of T11 B90stable. Sclerosis in the manubrium is unchanged. Sclerosis in the anterior eleventh seventh rib  is stable. Sclerosis in the posterior left fifth rib is stable. No entirely new bony metastatic lesions are seen within the chest. CT ABDOMEN AND PELVIS FINDINGS Hepatobiliary: Evaluation of the parenchymal organs is limited due to timing of contrast. There is a cyst in the left hepatic lobe. Another seen in the right hepatic lobe. No liver metastases are identified. The gallbladder and portal vein are unremarkable. Pancreas: No mass, inflammatory changes, or other significant abnormality. Spleen: Within normal limits in size and appearance. Adrenals/Urinary Tract: The adrenal glands are normal in appearance. No suspicious renal masses are seen. There is a nodule however posterior to the left kidney on series 2, image 59 measuring 14 by 12 mm, new in the interval. Stomach/Bowel: A PEG tube is identified. The stomach and small bowel are otherwise normal. The colon and  appendix are unremarkable. The Vascular/Lymphatic: The abdominal aorta is non aneurysmal. Mild atherosclerotic change in the aorta. No adenopathy. Reproductive: No mass or other significant abnormality. Musculoskeletal: A tiny sclerotic focus in T12 is stable. Metastatic disease and L2 and L3 with endplate irregularity is stable. No disease and L4. Sclerosis in the lateral aspect of L5 is stable. 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 No new bony metastatic disease seen in the abdomen or pelvis today. Other: There is a small amount of free fluid in the pelvis which is stable. No additional suspicious abnormalities. IMPRESSION: 1. No intracranial abnormalities identified. No metastatic disease. MRI imaging would be more sensitive and specific for metastatic disease in the brain. 2. Evaluation of the lesions in the chest is limited due to poor timing of contrast. The subpleural nodule in the right upper lobe is similar to slightly smaller in the interval. The larger more inferior nodule in the right upper lobe is a little smaller in the interval. Increased opacity adjacent to these right upper lobe masses, extending centrally towards the right hilum is nonspecific and could represent an infectious or inflammatory process. Evolving postradiation change or worsening malignancy are also possibilities. Recommend attention on follow-up. Interlobular septal thickening in the right upper lobe is mild but new and lymphangitic spread of tumor is not excluded. 3. Bony metastatic disease is essentially stable in the interval. 4. There is a new soft tissue nodule posterior to the left kidney consistent with metastatic disease. This is new in the interval. Electronically Signed   By: Dorise Bullion III M.D   On: 05/11/2016 20:23   Ct Chest W Contrast  Result Date: 05/11/2016 CLINICAL DATA:  Follow-up lung cancer. EXAM: CT HEAD WITHOUT AND WITH CONTRAST CT CHEST, ABDOMEN AND PELVIS WITH CONTRAST  TECHNIQUE: Contiguous axial images were obtained from the base of the skull through the vertex without and with intravenous contrast. Multidetector CT imaging of the chest, abdomen and pelvis was performed following the standard protocol during bolus administration of intravenous contrast. CONTRAST:  160m ISOVUE-300 IOPAMIDOL (ISOVUE-300) INJECTION 61%, 374mISOVUE-300 IOPAMIDOL (ISOVUE-300) INJECTION 61%<Contrast>10077mSOVUE-300 IOPAMIDOL (ISOVUE-300) INJECTION 61%, 15m2mOVUE-300 IOPAMIDOL (ISOVUE-300) INJECTION 61% COMPARISON:  CT scan of the brain March 09, 2016, MRI brain March 10, 2016, and CT of the chest/abdomen/ pelvis January 15, 2016 FINDINGS: CT HEAD FINDINGS Brain: No subdural, epidural, or subarachnoid. High attenuation in the right basal ganglia on series 2, image 12 is consistent with calcification. No intraparenchymal hemorrhage is identified. White matter changes are stable and severe. No acute cortical ischemia or infarct. The cerebellum and brainstem are normal. The basal cisterns are unremarkable. No midline shift. No enhancing  metastatic disease identified. Vascular: No hyperdense vessel or unexpected calcification. Skull: Normal. Negative for fracture or focal lesion. Sinuses/Orbits: No acute finding. Other: No other abnormalities. CT CHEST FINDINGS Cardiovascular: No significant vascular findings. Normal heart size. Mediastinum/Nodes: No adenopathy is seen within the chest. The thyroid is normal in appearance. The thoracic aorta is non aneurysmal. A small amount of pericardial fluid is stable. The heart size is within normal limits. Mild coronary artery calcifications are noted. The central great vessels otherwise normal. Lungs/Pleura: The central airways are normal. No pneumothorax. The timing of the contrast is suboptimal limiting evaluation of the patient's known lung malignancies. The 2 separate lesions in the right upper chest on the previous study are difficult to differentiate on  today's study. Additionally, there is increased infiltrate surrounding the known malignancy. The subpleural nodule seen on the previous study is difficult to measure due to timing of contrast. However, it appears to be similar in size measuring 2.0 x 2.7 cm today versus 2.3 x 2.7 cm previously. The larger slightly more inferior mass is also difficult to measure but measures approximately 3.0 by 2.8 cm today versus 3.5 x 3.2 cm previously. There is increased infiltrate surrounding this mass and extending towards the suprahilar region on the right. By report, the patient's last radiation therapy to the lung was April 2017. The infiltrate in the medial right lower lobe is likely unchanged but obscured by an enlarging right-sided pleural effusion and atelectasis. The right-sided pleural effusion is larger in the interval. There is a small left effusion now present as well. There is interlobular septal thickening in the right upper lobe which is mild but new. No other changes on the right. There is atelectasis in the left base. No suspicious nodules or masses are seen in the left lung. Musculoskeletal: The mixed lytic and sclerotic lesion with pathologic fracture in T9 is stable. A sclerotic focus the anterior aspect of C7 is stable. A sclerotic focus near the superior endplate of V25 is stable. Sclerosis in the manubrium is unchanged. Sclerosis in the anterior eleventh seventh rib is stable. Sclerosis in the posterior left fifth rib is stable. No entirely new bony metastatic lesions are seen within the chest. CT ABDOMEN AND PELVIS FINDINGS Hepatobiliary: Evaluation of the parenchymal organs is limited due to timing of contrast. There is a cyst in the left hepatic lobe. Another seen in the right hepatic lobe. No liver metastases are identified. The gallbladder and portal vein are unremarkable. Pancreas: No mass, inflammatory changes, or other significant abnormality. Spleen: Within normal limits in size and appearance.  Adrenals/Urinary Tract: The adrenal glands are normal in appearance. No suspicious renal masses are seen. There is a nodule however posterior to the left kidney on series 2, image 59 measuring 14 by 12 mm, new in the interval. Stomach/Bowel: A PEG tube is identified. The stomach and small bowel are otherwise normal. The colon and appendix are unremarkable. The Vascular/Lymphatic: The abdominal aorta is non aneurysmal. Mild atherosclerotic change in the aorta. No adenopathy. Reproductive: No mass or other significant abnormality. Musculoskeletal: A tiny sclerotic focus in T12 is stable. Metastatic disease and L2 and L3 with endplate irregularity is stable. No disease and L4. Sclerosis in the lateral aspect of L5 is stable. 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 No new bony metastatic disease seen in the abdomen or pelvis today. Other: There is a small amount of free fluid in the pelvis which is stable. No additional suspicious abnormalities. IMPRESSION: 1. No intracranial  abnormalities identified. No metastatic disease. MRI imaging would be more sensitive and specific for metastatic disease in the brain. 2. Evaluation of the lesions in the chest is limited due to poor timing of contrast. The subpleural nodule in the right upper lobe is similar to slightly smaller in the interval. The larger more inferior nodule in the right upper lobe is a little smaller in the interval. Increased opacity adjacent to these right upper lobe masses, extending centrally towards the right hilum is nonspecific and could represent an infectious or inflammatory process. Evolving postradiation change or worsening malignancy are also possibilities. Recommend attention on follow-up. Interlobular septal thickening in the right upper lobe is mild but new and lymphangitic spread of tumor is not excluded. 3. Bony metastatic disease is essentially stable in the interval. 4. There is a new soft tissue nodule posterior to  the left kidney consistent with metastatic disease. This is new in the interval. Electronically Signed   By: Dorise Bullion III M.D   On: 05/11/2016 20:23   Mr Brain W Wo Contrast  Result Date: 05/12/2016 CLINICAL DATA:  Metastatic lung cancer presenting with progressive generalized weakness. EXAM: MRI HEAD WITHOUT AND WITH CONTRAST TECHNIQUE: Multiplanar, multiecho pulse sequences of the brain and surrounding structures were obtained without and with intravenous contrast. CONTRAST:  8m MULTIHANCE GADOBENATE DIMEGLUMINE 529 MG/ML IV SOLN COMPARISON:  CT 05/11/2016. MRI 03/10/2016. Multiple previous MRIs as distant as 01/13/2015 FINDINGS: Brain: Generalized brain atrophy. No brainstem abnormality. Old small vessel infarctions in the cerebellum. Cerebral hemispheres show diffuse white matter signal consistent with radiation change. 4 mm enhancing metastasis in the left posterior frontal vertex is unchanged. There is a new 2 mm metastasis within the inferior cerebellar vermis on the right. No mass effect or edema. No evidence of acute ischemic infarction. No hemorrhage, hydrocephalus or extra-axial collection. No pituitary mass. Vascular: Major vessels at the base of the brain show flow. Skull and upper cervical spine: Negative Sinuses/Orbits: Clear/normal Other: None IMPRESSION: No acute finding by MRI. Chronic white matter hyperintensity consistent with radiation change. 4 mm enhancing metastasis in the left posterior frontal vertex region is unchanged without enlargement since 03/10/2016. Newly seen 2 mm metastasis at the inferior cerebellum on the right without mass effect or vasogenic edema. No other new lesions seen. Electronically Signed   By: MNelson ChimesM.D.   On: 05/12/2016 12:55   Ct Abdomen Pelvis W Contrast  Result Date: 05/11/2016 CLINICAL DATA:  Follow-up lung cancer. EXAM: CT HEAD WITHOUT AND WITH CONTRAST CT CHEST, ABDOMEN AND PELVIS WITH CONTRAST TECHNIQUE: Contiguous axial images were  obtained from the base of the skull through the vertex without and with intravenous contrast. Multidetector CT imaging of the chest, abdomen and pelvis was performed following the standard protocol during bolus administration of intravenous contrast. CONTRAST:  1038mISOVUE-300 IOPAMIDOL (ISOVUE-300) INJECTION 61%, 3061mSOVUE-300 IOPAMIDOL (ISOVUE-300) INJECTION 61%<Contrast>100m20mOVUE-300 IOPAMIDOL (ISOVUE-300) INJECTION 61%, 30mL38mVUE-300 IOPAMIDOL (ISOVUE-300) INJECTION 61% COMPARISON:  CT scan of the brain March 09, 2016, MRI brain March 10, 2016, and CT of the chest/abdomen/ pelvis January 15, 2016 FINDINGS: CT HEAD FINDINGS Brain: No subdural, epidural, or subarachnoid. High attenuation in the right basal ganglia on series 2, image 12 is consistent with calcification. No intraparenchymal hemorrhage is identified. White matter changes are stable and severe. No acute cortical ischemia or infarct. The cerebellum and brainstem are normal. The basal cisterns are unremarkable. No midline shift. No enhancing metastatic disease identified. Vascular: No hyperdense vessel or unexpected calcification. Skull:  Normal. Negative for fracture or focal lesion. Sinuses/Orbits: No acute finding. Other: No other abnormalities. CT CHEST FINDINGS Cardiovascular: No significant vascular findings. Normal heart size. Mediastinum/Nodes: No adenopathy is seen within the chest. The thyroid is normal in appearance. The thoracic aorta is non aneurysmal. A small amount of pericardial fluid is stable. The heart size is within normal limits. Mild coronary artery calcifications are noted. The central great vessels otherwise normal. Lungs/Pleura: The central airways are normal. No pneumothorax. The timing of the contrast is suboptimal limiting evaluation of the patient's known lung malignancies. The 2 separate lesions in the right upper chest on the previous study are difficult to differentiate on today's study. Additionally, there is  increased infiltrate surrounding the known malignancy. The subpleural nodule seen on the previous study is difficult to measure due to timing of contrast. However, it appears to be similar in size measuring 2.0 x 2.7 cm today versus 2.3 x 2.7 cm previously. The larger slightly more inferior mass is also difficult to measure but measures approximately 3.0 by 2.8 cm today versus 3.5 x 3.2 cm previously. There is increased infiltrate surrounding this mass and extending towards the suprahilar region on the right. By report, the patient's last radiation therapy to the lung was April 2017. The infiltrate in the medial right lower lobe is likely unchanged but obscured by an enlarging right-sided pleural effusion and atelectasis. The right-sided pleural effusion is larger in the interval. There is a small left effusion now present as well. There is interlobular septal thickening in the right upper lobe which is mild but new. No other changes on the right. There is atelectasis in the left base. No suspicious nodules or masses are seen in the left lung. Musculoskeletal: The mixed lytic and sclerotic lesion with pathologic fracture in T9 is stable. A sclerotic focus the anterior aspect of C7 is stable. A sclerotic focus near the superior endplate of Z61 is stable. Sclerosis in the manubrium is unchanged. Sclerosis in the anterior eleventh seventh rib is stable. Sclerosis in the posterior left fifth rib is stable. No entirely new bony metastatic lesions are seen within the chest. CT ABDOMEN AND PELVIS FINDINGS Hepatobiliary: Evaluation of the parenchymal organs is limited due to timing of contrast. There is a cyst in the left hepatic lobe. Another seen in the right hepatic lobe. No liver metastases are identified. The gallbladder and portal vein are unremarkable. Pancreas: No mass, inflammatory changes, or other significant abnormality. Spleen: Within normal limits in size and appearance. Adrenals/Urinary Tract: The adrenal  glands are normal in appearance. No suspicious renal masses are seen. There is a nodule however posterior to the left kidney on series 2, image 59 measuring 14 by 12 mm, new in the interval. Stomach/Bowel: A PEG tube is identified. The stomach and small bowel are otherwise normal. The colon and appendix are unremarkable. The Vascular/Lymphatic: The abdominal aorta is non aneurysmal. Mild atherosclerotic change in the aorta. No adenopathy. Reproductive: No mass or other significant abnormality. Musculoskeletal: A tiny sclerotic focus in T12 is stable. Metastatic disease and L2 and L3 with endplate irregularity is stable. No disease and L4. Sclerosis in the lateral aspect of L5 is stable. 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 No new bony metastatic disease seen in the abdomen or pelvis today. Other: There is a small amount of free fluid in the pelvis which is stable. No additional suspicious abnormalities. IMPRESSION: 1. No intracranial abnormalities identified. No metastatic disease. MRI imaging would be more sensitive  and specific for metastatic disease in the brain. 2. Evaluation of the lesions in the chest is limited due to poor timing of contrast. The subpleural nodule in the right upper lobe is similar to slightly smaller in the interval. The larger more inferior nodule in the right upper lobe is a little smaller in the interval. Increased opacity adjacent to these right upper lobe masses, extending centrally towards the right hilum is nonspecific and could represent an infectious or inflammatory process. Evolving postradiation change or worsening malignancy are also possibilities. Recommend attention on follow-up. Interlobular septal thickening in the right upper lobe is mild but new and lymphangitic spread of tumor is not excluded. 3. Bony metastatic disease is essentially stable in the interval. 4. There is a new soft tissue nodule posterior to the left kidney consistent with  metastatic disease. This is new in the interval. Electronically Signed   By: Dorise Bullion III M.D   On: 05/11/2016 20:23   Dg Chest Port 1 View  Result Date: 05/11/2016 CLINICAL DATA:  Worsening weakness today. Metastatic lung carcinoma. EXAM: PORTABLE CHEST 1 VIEW COMPARISON:  Chest CT on 01/15/2016 FINDINGS: Persistent mass seen in the lateral right upper lobe, similar in appearance to recent chest CT. No new or increased areas of pulmonary opacity are seen. No evidence of pneumothorax or pleural effusion. Heart size is normal. IMPRESSION: Persistent mass in lateral right upper lobe, similar in appearance to recent chest CT. No new findings. Electronically Signed   By: Earle Gell M.D.   On: 05/11/2016 12:56   Dg Abd Acute W/chest  Result Date: 05/14/2016 CLINICAL DATA:  Weakness and emesis.  Pneumonia.  Lung cancer. EXAM: DG ABDOMEN ACUTE W/ 1V CHEST COMPARISON:  05/11/2016 and CT scan from 05/11/2016. FINDINGS: Right apical lung mass. Retrocardiac density on the right similar to prior. Volume loss on the right. Heart size within normal limits. T9 compression fracture. Left-side-down decubitus view the abdomen demonstrates no free intraperitoneal gas. There is contrast medium within the colon. No significant abnormal air-fluid levels are identified. There is contrast medium in the distal small bowel as well. Contrast is present in the rectum. Left bony pelvic deformities from old healed fractures. Percutaneous gastrostomy tube noted. Mild levoconvex rotary scoliosis of the lumbar spine. Sclerosis at the L3 vertebral level compatible with known osseous metastatic disease in this vicinity. IMPRESSION: 1. Right apical lung mass with stable right retrocardiac density. 2. No appreciable change in the osseous metastatic disease. 3. No free air identified. Contrast medium in the bowel. No dilated bowel noted. Electronically Signed   By: Van Clines M.D.   On: 05/14/2016 15:32        Subjective: No new complaints.   Discharge Exam: Vitals:   05/16/16 0650 05/16/16 1346  BP: 126/82 110/78  Pulse: (!) 103 (!) 102  Resp: 20 18  Temp: 98.7 F (37.1 C) 97.7 F (36.5 C)   Vitals:   05/15/16 1844 05/15/16 2151 05/16/16 0650 05/16/16 1346  BP: 135/80 116/70 126/82 110/78  Pulse: (!) 108 (!) 110 (!) 103 (!) 102  Resp:  (!) _0 Temp:  98.6 F (37 C) 98.7 F (37.1 C) 97.7 F (36.5 C)  TempSrc:  Axillary Axillary Oral  SpO2:  95% 98% 98%  Weight:      Height:        General: Pt is alert, awake, not in acute distress Cardiovascular: RRR, S1/S2 +, no rubs, no gallops Respiratory: CTA bilaterally, no wheezing, no rhonchi Abdominal:  Soft, NT, ND, bowel sounds + Extremities: no edema, no cyanosis    The results of significant diagnostics from this hospitalization (including imaging, microbiology, ancillary and laboratory) are listed below for reference.     Microbiology: Recent Results (from the past 240 hour(s))  Urine culture     Status: None   Collection Time: 05/11/16 12:15 PM  Result Value Ref Range Status   Specimen Description URINE, RANDOM  Final   Special Requests NONE  Final   Culture NO GROWTH Performed at [facility]   Final   Report Status 05/12/2016 FINAL  Final  Blood Culture (routine x 2)     Status: None   Collection Time: 05/11/16 12:39 PM  Result Value Ref Range Status   Specimen Description BLOOD LEFT ANTECUBITAL  Final   Special Requests BOTTLES DRAWN AEROBIC AND ANAEROBIC 5CC  Final   Culture   Final    NO GROWTH 5 DAYS Performed at [facility]    Report Status 05/16/2016 FINAL  Final  Blood Culture (routine x 2)     Status: None   Collection Time: 05/11/16 12:47 PM  Result Value Ref Range Status   Specimen Description BLOOD LEFT HAND  Final   Special Requests IN PEDIATRIC BOTTLE [facility]  Final   Culture   Final    NO GROWTH 5 DAYS Performed at [facility]    Report Status 05/16/2016  FINAL  Final     Labs: BNP (last 3 results) No results for input(s): BNP in the last 8760 hours. Basic Metabolic Panel:  Recent Labs Lab 05/11/16 1210 05/12/16 0418 05/13/16 0340 05/14/16 0341 05/15/16 0402  NA 138 137 138 138 136  K 3.5 3.7 3.5 3.8 3.6  CL 107 108 109 111 108  CO2 23 21* 22 21* 21*  GLUCOSE 135* 97 96 105* 105*  BUN 27* _0 CREATININE 0.77 0.72 0.63 0.57 0.58  CALCIUM 8.1* 8.0* 7.8* 8.0* 8.1*  MG  --   --   --   --  1.8   Liver Function Tests:  Recent Labs Lab 05/11/16 1210  AST 33  ALT 35  ALKPHOS 67  BILITOT 0.5  PROT 6.5  ALBUMIN 2.7*   No results for input(s): LIPASE, AMYLASE in the last 168 hours. No results for input(s): AMMONIA in the last 168 hours. CBC:  Recent Labs Lab 05/11/16 1210 05/12/16 0418 05/13/16 0340 05/14/16 0341 05/15/16 0402  WBC 5.1 3.8* 3.8* 4.0 5.9  NEUTROABS 4.1  --   --  3.3  --   HGB 9.8* 9.0* 8.6* 8.6* 8.4*  HCT 29.2* 27.0* 25.9* 25.7* 24.9*  MCV 93.0 95.4 95.2 96.3 92.6  PLT 50* 47* 47* 56* 54*   Cardiac Enzymes: No results for input(s): CKTOTAL, CKMB, CKMBINDEX, TROPONINI in the last 168 hours. BNP: Invalid input(s): POCBNP CBG:  Recent Labs Lab 05/14/16 1654  GLUCAP 107*   D-Dimer No results for input(s): DDIMER in the last 72 hours. Hgb A1c No results for input(s): HGBA1C in the last 72 hours. Lipid Profile No results for input(s): CHOL, HDL, LDLCALC, TRIG, CHOLHDL, LDLDIRECT in the last 72 hours. Thyroid function studies No results for input(s): TSH, T4TOTAL, T3FREE, THYROIDAB in the last 72 hours.  Invalid input(s): FREET3 Anemia work up No results for input(s): VITAMINB12, FOLATE, FERRITIN, TIBC, IRON, RETICCTPCT in the last 72 hours. Urinalysis    Component Value Date/Time   COLORURINE YELLOW 05/11/2016 [place] 05/11/2016 1215  LABSPEC 1.021 05/11/2016 1215   PHURINE 6.0 05/11/2016 1215   GLUCOSEU NEGATIVE 05/11/2016 1215   HGBUR MODERATE (A)  05/11/2016 1215   BILIRUBINUR NEGATIVE 05/11/2016 1215   KETONESUR NEGATIVE 05/11/2016 1215   PROTEINUR NEGATIVE 05/11/2016 1215   UROBILINOGEN 1.0 02/11/2015 0748   NITRITE NEGATIVE 05/11/2016 1215   LEUKOCYTESUR NEGATIVE 05/11/2016 1215   Sepsis Labs Invalid input(s): PROCALCITONIN,  WBC,  LACTICIDVEN Microbiology Recent Results (from the past 240 hour(s))  Urine culture     Status: None   Collection Time: 05/11/16 12:15 PM  Result Value Ref Range Status   Specimen Description URINE, RANDOM  Final   Special Requests NONE  Final   Culture NO GROWTH Performed at [facility]   Final   Report Status 05/12/2016 FINAL  Final  Blood Culture (routine x 2)     Status: None   Collection Time: 05/11/16 12:39 PM  Result Value Ref Range Status   Specimen Description BLOOD LEFT ANTECUBITAL  Final   Special Requests BOTTLES DRAWN AEROBIC AND ANAEROBIC 5CC  Final   Culture   Final    NO GROWTH 5 DAYS Performed at [facility]    Report Status 05/16/2016 FINAL  Final  Blood Culture (routine x 2)     Status: None   Collection Time: 05/11/16 12:47 PM  Result Value Ref Range Status   Specimen Description BLOOD LEFT HAND  Final   Special Requests IN PEDIATRIC BOTTLE [facility]  Final   Culture   Final    NO GROWTH 5 DAYS Performed at [facility]    Report Status 05/16/2016 FINAL  Final     Time coordinating discharge: Over 30 minutes  SIGNED:   Hosie Poisson, MD  [facility] 05/16/2016, 3:59 PM Pager   If 7PM-7AM, please contact night-coverage [email or web address] Password [email or web address]

## 2016-05-16 NOTE — Progress Notes (Signed)
Discharge instructions reviewed with patient and significant other, questions answered, verbalized understanding.  [facility] called for patient transport home.

## 2016-05-16 NOTE — Progress Notes (Signed)
Pt to dc home with family. [facility] made aware that plan is for home. Will fill out medical necessity form and print along with face sheet for [facility] transport. Alinda Sierras Nelida Mandarino,RN,BSN,NCM [phone]

## 2016-05-16 NOTE — Progress Notes (Signed)
Oncology Nurse Navigator Documentation  Oncology Nurse Navigator Flowsheets 05/16/2016  Navigator Encounter Type Other/went to see Johnesha in the hospital today.  She was sleeping and I did not want to wake her.    Treatment Phase Treatment  Interventions Other  Acuity Level 1  Acuity Level 1 Minimal follow up required  Acuity Level 2 Other  Time Spent with Patient 30

## 2016-05-17 ENCOUNTER — Other Ambulatory Visit: Payer: Self-pay | Admitting: Radiation Therapy

## 2016-05-17 DIAGNOSIS — C7931 Secondary malignant neoplasm of brain: Secondary | ICD-10-CM

## 2016-05-17 DIAGNOSIS — C7949 Secondary malignant neoplasm of other parts of nervous system: Principal | ICD-10-CM

## 2016-05-20 ENCOUNTER — Telehealth: Payer: Self-pay | Admitting: *Deleted

## 2016-05-20 NOTE — Telephone Encounter (Signed)
Oncology Nurse Navigator Documentation  Oncology Nurse Navigator Flowsheets 05/20/2016  Navigator Encounter Type Telephone/I received a call from [place], patient's partner.  She stated patient was in Rehab center and was questioning labs. I updated Dr. Julien Nordmann.  He asked if patient is having signs for bleeding and if not, labs can be cancelled on 05/22/16.  Spoke with [name] and explained what signs of bleeding were and she stated Dynasty does not have this.  I updated her that appt for labs on 05/22/16 were cancelled. I also stated that if she noticed bleeding signs for her to call us.  She verbalized understanding. I will cancel labs   Telephone Outgoing Call  Patient Visit Type -  Treatment Phase Follow-up  Barriers/Navigation Needs Coordination of Care  Interventions Coordination of Care  Coordination of Care Appts;Other  Acuity Level 2  Acuity Level 2 Other  Time Spent with Patient 45

## 2016-05-22 ENCOUNTER — Ambulatory Visit: Payer: BC Managed Care – PPO | Admitting: Internal Medicine

## 2016-05-22 ENCOUNTER — Telehealth: Payer: Self-pay | Admitting: Radiation Oncology

## 2016-05-22 ENCOUNTER — Ambulatory Visit: Payer: BC Managed Care – PPO

## 2016-05-22 ENCOUNTER — Other Ambulatory Visit: Payer: BC Managed Care – PPO

## 2016-05-22 NOTE — Telephone Encounter (Signed)
I called and spoke with Laura Walter, the patient's partner and she is pleased with the patient being at [facility] skilled facility center. We spent time discussing that Laura Walter's deconditioning is likely multifactorial involving her previous treatments, nature of her primary cancer, and deconditioning from lack of nutrition. [name] will keep [place] informed of [name]'s progress but the plan is to proceed with aggressive rehab, possibly followed by additional systemic therapy and possibly SRS again if she is medically appropriate after her next MRI. We will revisit this issue after her scan in November.

## 2016-05-24 ENCOUNTER — Other Ambulatory Visit: Payer: Self-pay | Admitting: *Deleted

## 2016-05-24 DIAGNOSIS — C7951 Secondary malignant neoplasm of bone: Secondary | ICD-10-CM

## 2016-05-24 DIAGNOSIS — Z5111 Encounter for antineoplastic chemotherapy: Secondary | ICD-10-CM

## 2016-05-24 DIAGNOSIS — C3411 Malignant neoplasm of upper lobe, right bronchus or lung: Secondary | ICD-10-CM

## 2016-05-24 MED ORDER — INTEGRA PLUS PO CAPS
1.0000 | ORAL_CAPSULE | Freq: Every morning | ORAL | 0 refills | Status: DC
Start: 2016-05-24 — End: 2016-06-11

## 2016-05-24 MED ORDER — OLANZAPINE 10 MG PO TABS: ORAL_TABLET | ORAL | 0 refills | Status: AC

## 2016-05-27 ENCOUNTER — Telehealth: Payer: Self-pay | Admitting: *Deleted

## 2016-05-27 NOTE — Telephone Encounter (Signed)
Oncology Nurse Navigator Documentation  Oncology Nurse Navigator Flowsheets 05/27/2016  Navigator Location [facility]  Navigator Encounter Type Telephone/I received a message from Laura Walter having a question about CT scan.  She wants to know if Laura Walter needed another CT scan when she had one on 10/7.  I updated Dr. Julien Nordmann.  He stated it could be cancelled.  I called and updated Laura Walter that scan was cancelled.  I also called central scheduling to cancel scan as well. Patient has a follow up with Dr. Julien Nordmann on 05/29/16.    Telephone Outgoing Call  Treatment Phase Treatment  Barriers/Navigation Needs Coordination of Care  Interventions Coordination of Care  Coordination of Care Appts  Acuity Level 2  Acuity Level 2 Other  Time Spent with Patient 30

## 2016-05-28 ENCOUNTER — Encounter: Payer: Self-pay | Admitting: Pharmacist

## 2016-05-28 ENCOUNTER — Ambulatory Visit (HOSPITAL_COMMUNITY): Payer: BC Managed Care – PPO

## 2016-05-29 ENCOUNTER — Other Ambulatory Visit: Payer: BC Managed Care – PPO

## 2016-05-29 ENCOUNTER — Other Ambulatory Visit (HOSPITAL_BASED_OUTPATIENT_CLINIC_OR_DEPARTMENT_OTHER): Payer: BC Managed Care – PPO

## 2016-05-29 ENCOUNTER — Ambulatory Visit (HOSPITAL_BASED_OUTPATIENT_CLINIC_OR_DEPARTMENT_OTHER): Payer: BC Managed Care – PPO | Admitting: Nurse Practitioner

## 2016-05-29 ENCOUNTER — Ambulatory Visit: Payer: BC Managed Care – PPO

## 2016-05-29 VITALS — BP 118/67 | HR 108 | Temp 98.0°F | Resp 18 | Ht 66.0 in | Wt 133.0 lb

## 2016-05-29 DIAGNOSIS — R531 Weakness: Secondary | ICD-10-CM | POA: Diagnosis not present

## 2016-05-29 DIAGNOSIS — C3411 Malignant neoplasm of upper lobe, right bronchus or lung: Secondary | ICD-10-CM | POA: Diagnosis not present

## 2016-05-29 DIAGNOSIS — Z5111 Encounter for antineoplastic chemotherapy: Secondary | ICD-10-CM

## 2016-05-29 DIAGNOSIS — D696 Thrombocytopenia, unspecified: Secondary | ICD-10-CM | POA: Diagnosis not present

## 2016-05-29 DIAGNOSIS — R530 Neoplastic (malignant) related fatigue: Secondary | ICD-10-CM

## 2016-05-29 DIAGNOSIS — C7951 Secondary malignant neoplasm of bone: Secondary | ICD-10-CM | POA: Diagnosis not present

## 2016-05-29 DIAGNOSIS — C7931 Secondary malignant neoplasm of brain: Secondary | ICD-10-CM

## 2016-05-29 LAB — COMPREHENSIVE METABOLIC PANEL
ALK PHOS: 108 U/L (ref 40–150)
ALT: 93 U/L — AB (ref 0–55)
ANION GAP: 12 meq/L — AB (ref 3–11)
AST: 46 U/L — ABNORMAL HIGH (ref 5–34)
Albumin: 2.3 g/dL — ABNORMAL LOW (ref 3.5–5.0)
BUN: 31.6 mg/dL — ABNORMAL HIGH (ref 7.0–26.0)
CALCIUM: 9.4 mg/dL (ref 8.4–10.4)
CHLORIDE: 101 meq/L (ref 98–109)
CO2: 24 mEq/L (ref 22–29)
CREATININE: 0.8 mg/dL (ref 0.6–1.1)
EGFR: 84 mL/min/{1.73_m2} — ABNORMAL LOW (ref >90)
Glucose: 136 mg/dl (ref 70–140)
Potassium: 4.2 mEq/L (ref 3.5–5.1)
Sodium: 138 mEq/L (ref 136–145)
Total Bilirubin: 0.28 mg/dL (ref 0.20–1.20)
Total Protein: 7.6 g/dL (ref 6.4–8.3)

## 2016-05-29 LAB — CBC WITH DIFFERENTIAL/PLATELET
BASO%: 0.2 % (ref 0.0–2.0)
BASOS ABS: 0 10*3/uL (ref 0.0–0.1)
EOS%: 0.7 % (ref 0.0–7.0)
Eosinophils Absolute: 0 10*3/uL (ref 0.0–0.5)
HEMATOCRIT: 26.1 % — AB (ref 34.8–46.6)
HGB: 8.2 g/dL — ABNORMAL LOW (ref 11.6–15.9)
LYMPH#: 0.3 10*3/uL — AB (ref 0.9–3.3)
LYMPH%: 5.6 % — ABNORMAL LOW (ref 14.0–49.7)
MCH: 31.1 pg (ref 25.1–34.0)
MCHC: 31.4 g/dL — AB (ref 31.5–36.0)
MCV: 98.9 fL (ref 79.5–101.0)
MONO#: 0.5 10*3/uL (ref 0.1–0.9)
MONO%: 8.6 % (ref 0.0–14.0)
NEUT#: 4.9 10*3/uL (ref 1.5–6.5)
NEUT%: 84.9 % — AB (ref 38.4–76.8)
PLATELETS: 153 10*3/uL (ref 145–400)
RBC: 2.64 10*6/uL — ABNORMAL LOW (ref 3.70–5.45)
RDW: 18.4 % — ABNORMAL HIGH (ref 11.2–14.5)
WBC: 5.7 10*3/uL (ref 3.9–10.3)

## 2016-05-29 LAB — TSH: TSH: 1.35 m(IU)/L (ref 0.308–3.960)

## 2016-05-29 MED ORDER — OSIMERTINIB MESYLATE 80 MG PO TABS: 80.0000 mg | ORAL_TABLET | Freq: Every day | ORAL | 2 refills | Status: DC

## 2016-05-29 NOTE — Progress Notes (Addendum)
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 Lisbeth Renshaw. 2) Gilotrif 40 mg by mouth daily. Status post 5 months of treatment. 3) stereotactic radiotherapy to the enlarging right upper lobe lung mass under the care of Dr. Lisbeth Renshaw. 4) stereotactic radiotherapy to another right upper lobe lung mass as well as solitary brain metastases. 5) Gilotrif 30 mg by mouth daily. First dose started 08/31/2015. Status post 6 months of treatment, discontinued secondary to disease progression. 6) palliative radiotherapy to the progressive lesion in the right side of the chest as well as the metastatic bone lesion involving C6 and C7 vertebrae with epidural tumor in the lateral recess as well as palliative radiotherapy to T8-the in the spine, under the care of Dr. Lisbeth Renshaw completed on 03/04/2016.  CURRENT THERAPY:  1) Systemic chemotherapy with carboplatin for AUC of 5, Alimta 500 MG/M2 and Ketruda (pembrolizumab) 200 MG/M2 every 3 weeks. First dose 03/13/2016. 2) Xgeva 120 mg subcutaneously every 4 weeks. First dose 05/01/2015.   INTERVAL HISTORY:   Laura Walter returns as scheduled. She completed cycle 2 carboplatin/Alimta/Pembrolizumab 04/11/2016. Subsequent treatments have been on hold due to persistent thrombocytopenia. She was hospitalized 05/11/2016 through 05/16/2016 with generalized weakness. CT scans 05/11/2016 showed no concerning findings for disease progression except for new soft tissue nodule posterior to the left kidney. Brain MRI showed a 2 mm right cerebellar lesion with no concerning vasogenic edema.  She continues  physical therapy at the nursing facility. Her friends feel she is becoming stronger. She is able to stand and walk with a walker with assistance. She has developed neck weakness. Her friends plan on obtaining a cervical collar. Appetite is poor. She continues tube feedings. She had a single episode of nausea/vomiting felt to be secondary to the tube feeding. No diarrhea.  Objective:  Vital signs in last 24 hours:  Blood pressure 118/67, pulse (!) 108, temperature 98 F (36.7 C), temperature source Oral, resp. rate 18, height '5\' 6"'$  (1.676 m), weight 133 lb (60.3 kg), SpO2 98 %.    HEENT: No thrush or ulcers. Lymphatics: No palpable cervical or supra-clavicular lymph nodes. Resp: Lungs clear bilaterally. Cardio: Regular rate and rhythm. GI: No hepatomegaly. Vascular: No leg edema.   Lab Results:  Lab Results  Component Value Date   WBC 5.7 05/29/2016   HGB 8.2 (L) 05/29/2016   HCT 26.1 (L) 05/29/2016   MCV 98.9 05/29/2016   PLT 153 05/29/2016   NEUTROABS 4.9 05/29/2016    Imaging:  No results found.  Medications: I have reviewed the patient's current medications.  Assessment/Plan: 1. 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 Subsequent treatments have been on hold due to significant thrombocytopenia.   Disposition: [name] has completed 2 cycles of carboplatin/Alimta/Pembrolizumab. Recent restaging CT scans showed no evidence of disease progression except for new soft tissue nodule posterior to the left kidney and brain MRI which showed a 2 mm right cerebellar lesion with no concerning vasogenic edema.  She was last treated with chemotherapy 04/11/2016. Treatment has remained on hold due to  persistent thrombocytopenia and weakness. The platelet count today is in normal range. She continues to have significant weakness.  Dr.  Julien Nordmann recommends discontinuation of carboplatin/Alimta/Pembrolizumab due to poor tolerance. Dr. Julien Nordmann discussed a trial of Tagrisso with [name] and her friends. Potential toxicities including rash and diarrhea were reviewed. They are in agreement to proceed. We will submit a prescription to her insurance company for approval. If approved she will need a baseline EKG.  Next visit is scheduled 06/19/2016. We will adjust accordingly pending approval of Tagrisso.   Patient seen with Dr. Julien Nordmann.    Ned Card ANP/GNP-BC   05/29/2016  12:14 PM  ADDENDUM: Hematology/Oncology Attending: I had a face to face encounter with the patient today. I recommended her care plan. This is a very pleasant 65 years old white female with metastatic non-small cell lung cancer, adenocarcinoma with positive EGFR mutation in exon 19. The patient has evidence for disease progression and was started recently on systemic chemotherapy with carboplatin, Alimta and Ketruda (pembrolizumab) status post 2 cycles. She has rough time with this treatment and had significant pancytopenia with severe thrombocytopenia. She was recently admitted to [facility] and restaging scan showed no clear evidence for disease progression except for new soft tissue nodule posterior to the left kidney and a new 2 mm right cerebellar lesion. She continues to have significant weakness and fatigue and she is currently in rehabilitation Center for physical therapy. She is improving slightly but continues to have significant fatigue. I had a lengthy discussion with the patient and her family today about her condition. I do think the patient will be a good candidate to resume her systemic chemotherapy at this point. I discussed with her treatment with Tagrisso 80 mg by mouth daily which showed significant improvement in progression free survival and patient with EGFR mutations and the first-line setting based on the recent data presented at  ESMO 2017 and it is currently included in the NCCN in guidelines for first line therapy for patient with positive EGFR mutation with they have sensitive or resistant mutations. I hope her insurance will cover this medication. If approved, I will arrange for the patient to have EKG before starting the first dose of her treatment and she will have blood work including CBC and comprehensive metabolic panel every 2 weeks during the first month of her treatment. The patient and her family agreed to the current plan. She would come back for follow-up visit in one month's for reevaluation. She was advised to call immediately if she has any concerning symptoms in the interval.  Disclaimer: This note was dictated with voice recognition software. Similar sounding words can inadvertently be transcribed and may be missed upon review. Eilleen Kempf., MD 05/29/16

## 2016-05-30 ENCOUNTER — Telehealth: Payer: Self-pay | Admitting: Pharmacist

## 2016-05-30 NOTE — Telephone Encounter (Signed)
Tagrisso Rx escribed by MD on 05/29/16. Labs reviewed - ok for treatment.  Drug-drug interactions noted: Tagrisso and Zofran (Category D): Possible QTc prolongation. Zofran is PRN Tagrisso and Zyprexa (Category C):  Possible QTc prolongation - monitor. Informed MD.  MD will order EKG once Tagrisso is approved and before treatment is started. No evaluation of LVEF at this time per MD.  Received notification that [place] requires Prior Authorization Faxed PA to [place].  Faxed PA to [phone]. Fax confirmation received.  Raul Del, PharmD, BCPS, BCOP [facility]otherapy [facility] [phone]

## 2016-05-30 NOTE — Telephone Encounter (Deleted)
Received notification that Tagrisso requires PA. Completed Tagrisso Prior Authorization Request. Faxed to [phone] Faxed confirmation received.

## 2016-06-03 ENCOUNTER — Telehealth: Payer: Self-pay | Admitting: Internal Medicine

## 2016-06-03 ENCOUNTER — Telehealth: Payer: Self-pay | Admitting: Medical Oncology

## 2016-06-03 ENCOUNTER — Telehealth: Payer: Self-pay | Admitting: Family Medicine

## 2016-06-03 NOTE — Telephone Encounter (Signed)
Called and gave the notification from PCP. Advised that this needs to come from oncology. Laura Walter stated an understanding.

## 2016-06-03 NOTE — Telephone Encounter (Signed)
I have not seen pt since March 2016 and I am not able to sign orders for home health.  This would need to come from Oncology

## 2016-06-03 NOTE — Telephone Encounter (Signed)
Per 10/25 los cxd 11/1 lab, 11/8 f/u/tx. 11/15 and 12/6 tx. Other appointments remain the same. Not able to reach patient or leave message. Patient is mychart active.

## 2016-06-03 NOTE — Telephone Encounter (Signed)
PT leaving Jannifer Rodney to go home with home health. Will Mohamed sign orders for PT, OT, Nursing.i told her okay.

## 2016-06-03 NOTE — Telephone Encounter (Signed)
Laura Walter with [facility] to request verbal orders for nursing, PT, OT and home health aide.  Patient will be discharged to [facility] on tomorrow.  Please return call to [place] at [phone].

## 2016-06-04 ENCOUNTER — Encounter: Payer: Self-pay | Admitting: Pharmacist

## 2016-06-04 NOTE — Progress Notes (Signed)
Oral Chemotherapy Pharmacist Encounter  Received notification from patient's insurance that prior authorization for [place] had been denied. Appeal letter, last MD note, NCCN guidelines, and ESMO 2017 abstract of [place] trial faxed to [facility] appeals department at [phone]. Appeal marked as [facility] will continue to follow.  Johny Drilling, PharmD, BCPS 06/04/2016  1:51 PM [facility]otherapy [facility] [phone]

## 2016-06-05 ENCOUNTER — Other Ambulatory Visit: Payer: BC Managed Care – PPO

## 2016-06-06 ENCOUNTER — Telehealth: Payer: Self-pay | Admitting: *Deleted

## 2016-06-06 ENCOUNTER — Encounter: Payer: Self-pay | Admitting: *Deleted

## 2016-06-06 NOTE — Progress Notes (Signed)
Oral Chemotherapy Pharmacist Encounter [place] [facility] [place] 06/06/2016 2:46 PM  Received notification from [facility] that appeal for prior authorization denial for [employer] has been denied. The denial noted that this was the final level of review available to physicians/providers.  D/w Norton Blizzard, RN, thoracic navigator. We will look into manufacturer assistance.  I called [employer]'s patient assistance program ([employer]) to inquire about income cut off and if patient would be eligible for their services. I was instructed to fill out application for patient and include insurance denial with application when it is sent in. Their income requirement for assistance is $100k. If patient does not meet these requirements, we may be able to appeal for manufacturer assistance since the patient is essentially uninsured for this medication.  Hinton Dyer will call patient to update her on status and request proof of income documents.  [facility] will continue to follow.   Johny Drilling, PharmD, BCPS 06/06/2016  2:46 PM [facility]otherapy [facility] [phone]

## 2016-06-06 NOTE — Telephone Encounter (Signed)
Oncology Nurse Navigator Documentation  Oncology Nurse Navigator Flowsheets 06/06/2016  Navigator Location [facility]  Navigator Encounter Type Telephone/I spoke with pharmacist, Laura Walter.  She asked if I could call and get Laura Walter 2016 income tax return.  I spoke with patient's partner Laura Walter and she will get tax forms to cancer center   Telephone Outgoing Call  Treatment Phase Treatment  Barriers/Navigation Needs Financial  Interventions Education;Medication Assistance  Education Method Verbal  Acuity Level 2  Acuity Level 2 Other  Time Spent with Patient 30

## 2016-06-06 NOTE — Progress Notes (Signed)
I received Laura Walter's tax forms.  I laid on [place] desk.

## 2016-06-07 ENCOUNTER — Telehealth: Payer: Self-pay | Admitting: *Deleted

## 2016-06-07 NOTE — Telephone Encounter (Signed)
[place] Nurse called to obtain verbal orders for a pressure ulcer on patient sacrum to deliver treatment and wound care to area.   Please call Claiborne Billings back at [phone] to advise orders.

## 2016-06-07 NOTE — Progress Notes (Signed)
Oral Chemotherapy Pharmacist Encounter  [employer] application completed and faxed on 11/2. Received notification today (11/3) from [employer] that they had received patient's application and would require letter of medical necessity and completed prescriber attestation certification to use the medication off-label. I alerted Norton Blizzard, RN about the status of Ms. Moncus's application.  [facility] will write the letter of medical necessity and leave for Dr. Julien Nordmann to sign, along with prescriber attestation, MD will be back in office on 11/6.  [facility] will continue to follow.  Johny Drilling, PharmD, BCPS 06/07/2016  4:20 PM Oral Chemotherapy Clinic [phone]

## 2016-06-07 NOTE — Telephone Encounter (Signed)
Returned call to [place] at [facility] who advised pt has 2x2 wound to sacrum 0.25 deep, able to see the bone but it is not protruding at this point. Pt not eating, does not have feeling in left arm, unable to grip. Pt's Partner [name] is applying dressings to wounds as instructed by [place]. Discussed with Claiborne Billings MD out of office, orders may need to go through PCP, Claiborne Billings advised pt's PCP refuses to sign orders on pt as he has not seen pt since 2016. No further concerns. Will review request with MD.

## 2016-06-08 IMAGING — CT CT ABD-PELV W/ CM
1 of 3 series · 14 of 30 positions shown, 18 images · IV contrast (OMNIPAQUE)
Comparison: Diagnostic CTs of 01/05/2015.  PET of 01/10/2015.

CLINICAL DATA: Right lung cancer diagnosed [DATE]. Bone metastasis to
spine. Brain metastasis. Chemotherapy in progress. Radiation therapy
completed in [REDACTED]. Chronic cough. Tenderness of feeding tube site.
Restaging of non-small-cell type

EXAM:
CT CHEST, ABDOMEN, AND PELVIS WITH CONTRAST
TECHNIQUE: Multidetector CT imaging of the chest, abdomen and pelvis was
performed following the standard protocol during bolus
administration of intravenous contrast.
CONTRAST:  100mL OMNIPAQUE IOHEXOL 300 MG/ML  SOLN

[Series 2: cap with st · axial · 0.72mm/px · z∈[-657,-97]mm · 14 of 130 slices shown, 18 images]
[im 9/130  mediastinal]
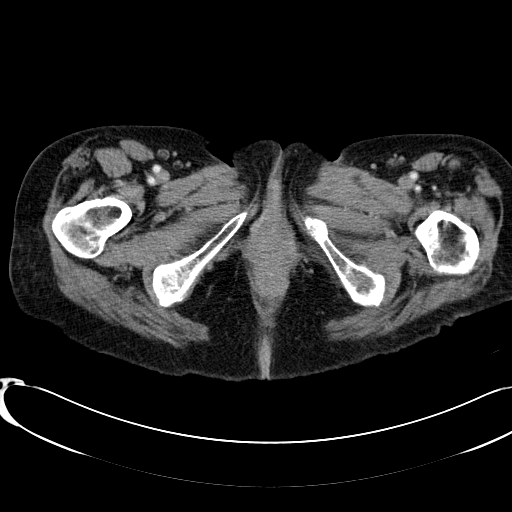
[im 9/130  lung]
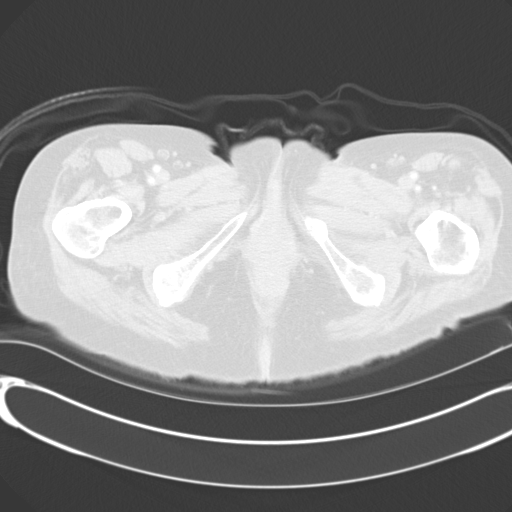
[im 18/130  lung]
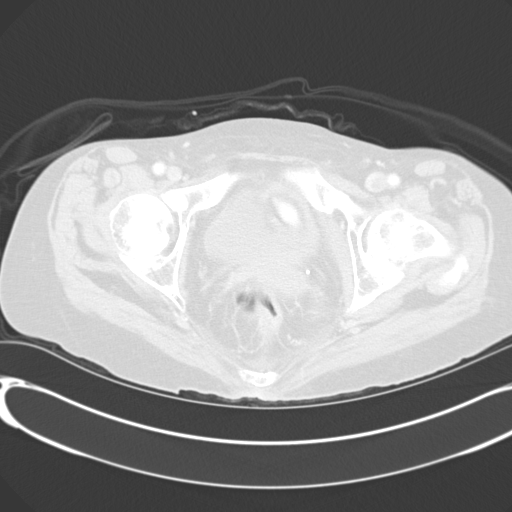
[im 26/130  lung]
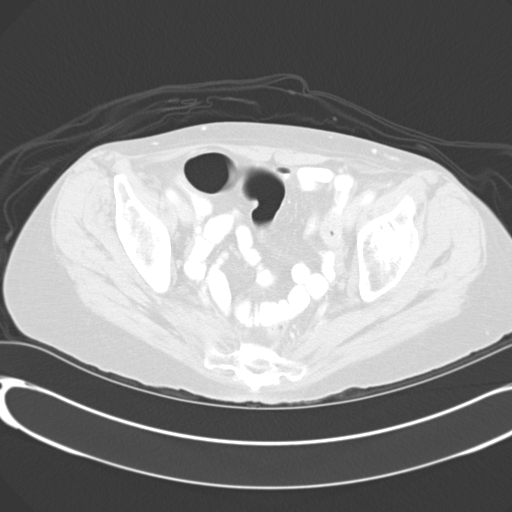
[im 35/130  lung]
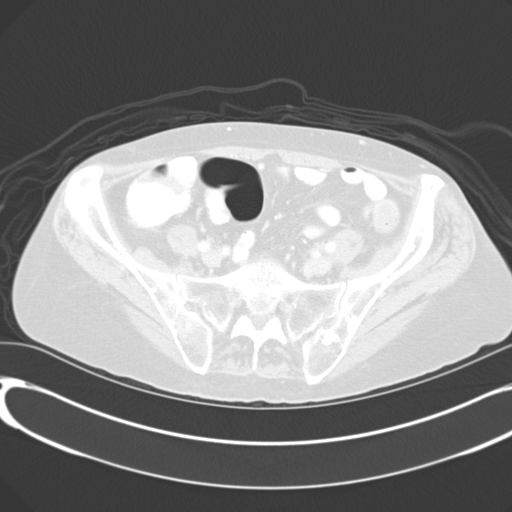
[im 44/130  mediastinal]
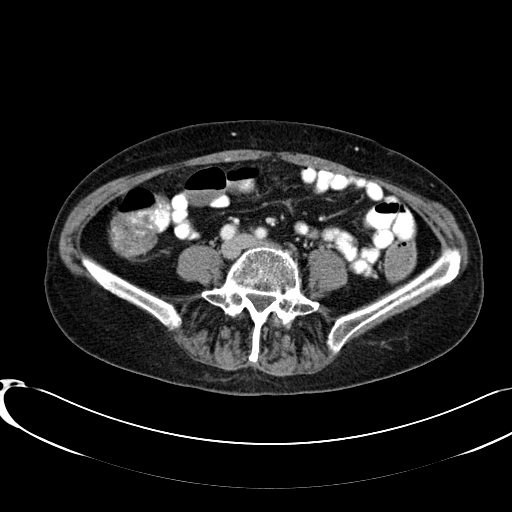
[im 44/130  lung]
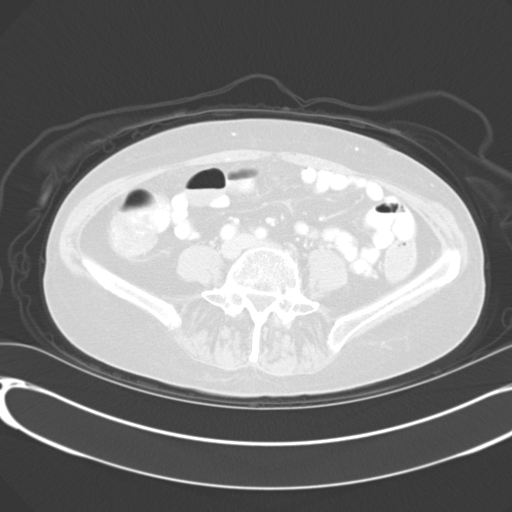
[im 52/130  lung]
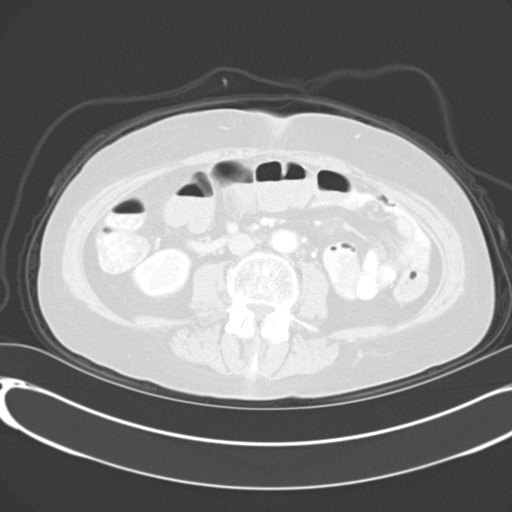
[im 61/130  lung]
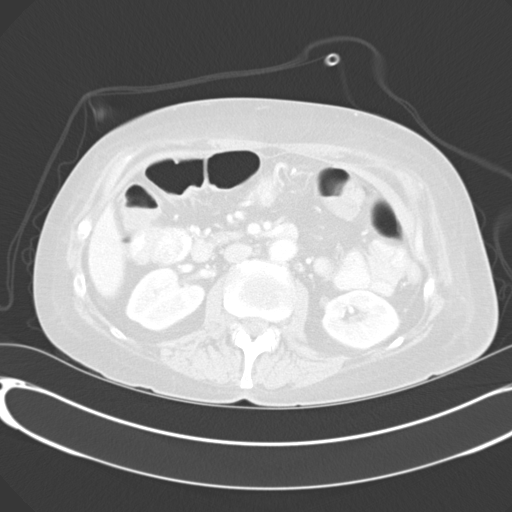
[im 69/130  lung]
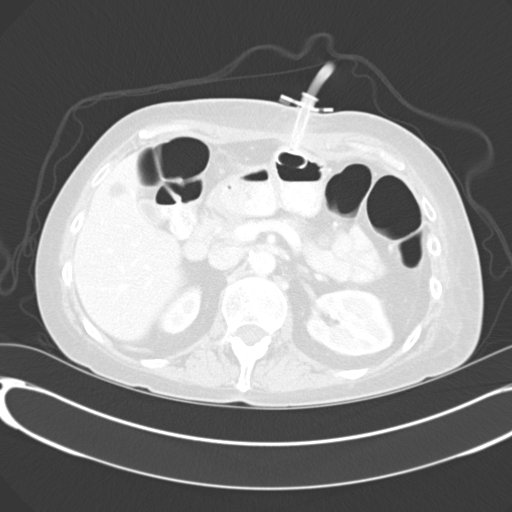
[im 78/130  mediastinal]
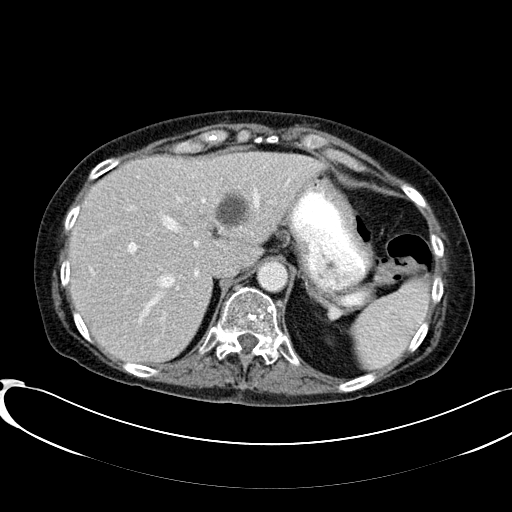
[im 78/130  lung]
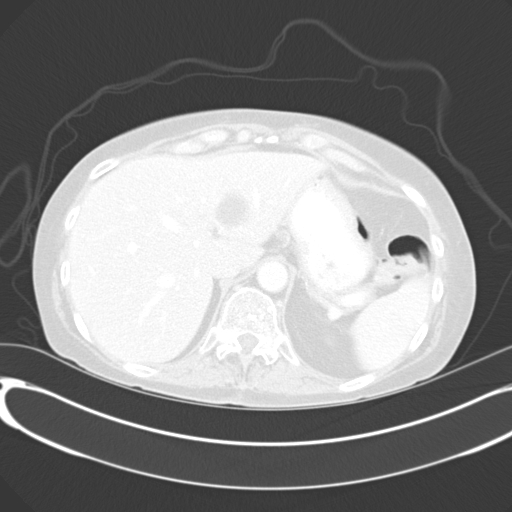
[im 87/130  lung]
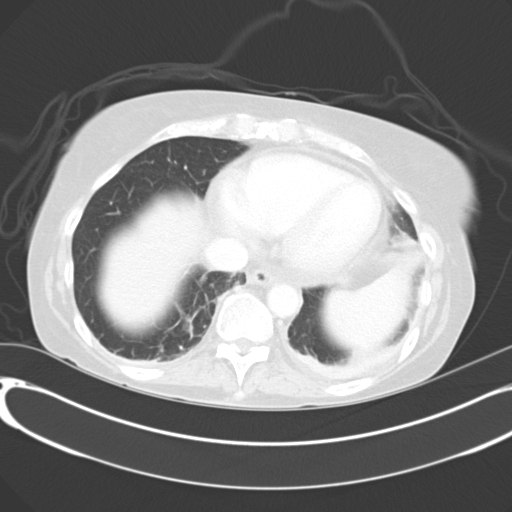
[im 95/130  lung]
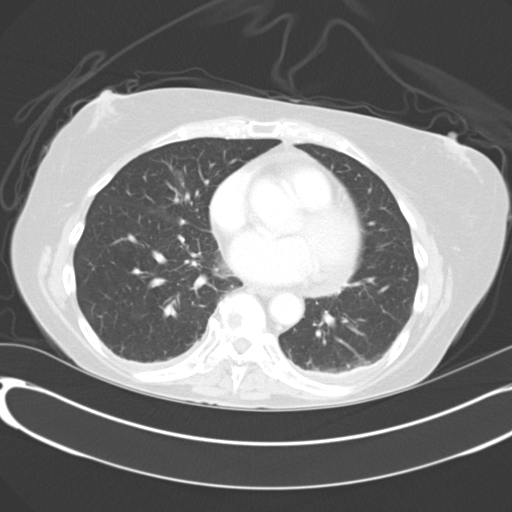
[im 104/130  lung]
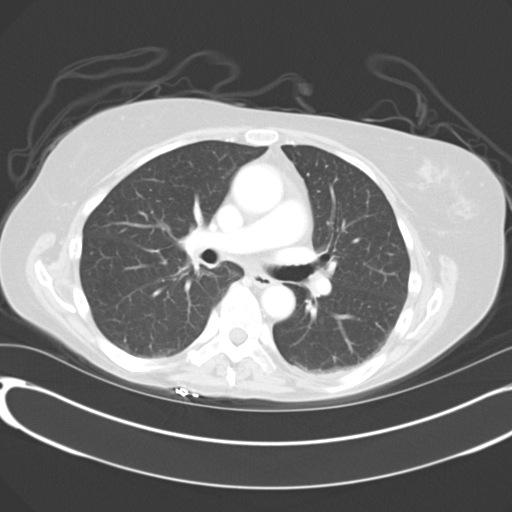
[im 112/130  mediastinal]
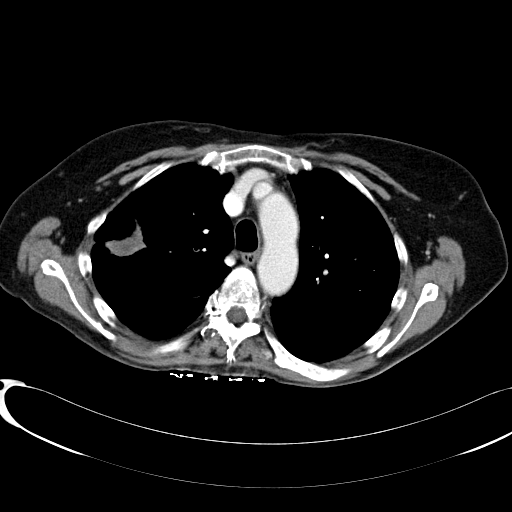
[im 112/130  lung]
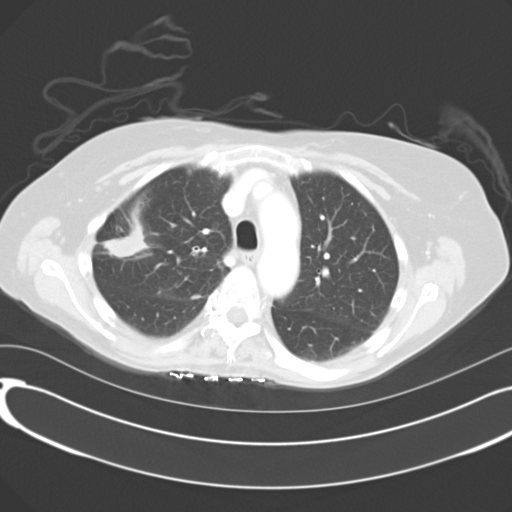
[im 121/130  lung]
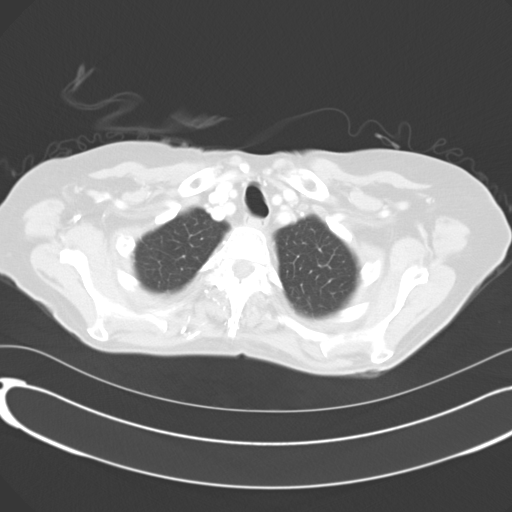

[14 of 30 positions shown; findings below may reference images not displayed]

FINDINGS: CT CHEST FINDINGS

Mediastinum/Nodes: No supraclavicular adenopathy. Tortuous
descending thoracic aorta. Mild cardiomegaly with resolution of
pericardial effusion. Trace residual fluid is likely physiologic.
LAD coronary artery atherosclerosis on image 32. No central
pulmonary embolism, on this non-dedicated study.

Resolution of right paratracheal and subcarinal adenopathy. Right
hilar adenopathy is also improved to resolved. Upper normal size
cm node remains image 26.

Lungs/Pleura: Decrease in right pleural effusion. Trace bilateral
pleural fluid remains.

Decreased in posterior right upper lobe lung lesion. 2.0 x 2.6 cm on
image 18 versus 4.1 x 4.8 cm on the 01/05/2015 exam.

Improved right middle lobe aeration. Minimal opacity remains on
image 35, favoring subsegmental atelectasis.

Musculoskeletal: New sclerosis involving the sternal body.

CT ABDOMEN AND PELVIS FINDINGS

Hepatobiliary: Hepatic cysts. Contracted gallbladder, without
surrounding inflammation or biliary duct dilatation.

Pancreas: Mild to moderate pancreatic atrophy, without dominant mass
or duct dilatation.

Spleen: Normal in size, without focal abnormality.

Adrenals/Urinary Tract: Normal adrenal glands. Normal kidneys,
without hydronephrosis. Normal urinary bladder.

Stomach/Bowel: Gastrostomy tube is appropriately positioned, placed
since the prior CT. Fluid-filled colon, suggesting a diarrheal
state. Normal terminal ileum. Normal small bowel.

Vascular/Lymphatic: Normal caliber of the aorta and branch vessels.
No abdominopelvic adenopathy.

Reproductive: Normal uterus and adnexa.

Other: Trace cul-de-sac fluid is similar compared to 01/10/15. No
abdominal ascites. No evidence of omental or peritoneal disease.

Musculoskeletal: Lucent lesion within the left ischium measures
cm today and is similar in size to on the prior exam. A new or
increased lesion involving the left inferior pubic ramus has
pathologic fracture on image 121 of series 2, new. Osteopenia. Right
iliac 6 mm sclerotic lesion is new.

A right-sided L3 lesion has undergone interval healing, and measures
2.9 cm on image 75. Compare 2.1 cm on the prior.
IMPRESSION: 1. Response to therapy of right upper lobe primary and thoracic
nodal metastasis.
2. Osseous metastasis ; overall progressive. Some lesions are new
and/or have undergone interval healing. There are enlarging lesions,
including pathologic fracture which is new in the left inferior
pubic ramus.
3. No progressive extraosseous disease identified.
4. Similar small volume cul-de-sac fluid.
5. Fluid-filled colon, suggesting a diarrheal state.

## 2016-06-09 ENCOUNTER — Emergency Department (HOSPITAL_COMMUNITY): Payer: BC Managed Care – PPO

## 2016-06-09 ENCOUNTER — Inpatient Hospital Stay (HOSPITAL_COMMUNITY)
Admission: EM | Admit: 2016-06-09 | Discharge: 2016-06-11 | DRG: 542 | Disposition: A | Discharge status: Home Health | Payer: BC Managed Care – PPO | Attending: Internal Medicine | Admitting: Internal Medicine

## 2016-06-09 ENCOUNTER — Encounter (HOSPITAL_COMMUNITY): Payer: Self-pay | Admitting: Emergency Medicine

## 2016-06-09 DIAGNOSIS — L89150 Pressure ulcer of sacral region, unstageable: Secondary | ICD-10-CM | POA: Diagnosis present

## 2016-06-09 DIAGNOSIS — C7931 Secondary malignant neoplasm of brain: Secondary | ICD-10-CM | POA: Diagnosis present

## 2016-06-09 DIAGNOSIS — Z931 Gastrostomy status: Secondary | ICD-10-CM

## 2016-06-09 DIAGNOSIS — E860 Dehydration: Secondary | ICD-10-CM | POA: Diagnosis not present

## 2016-06-09 DIAGNOSIS — C7951 Secondary malignant neoplasm of bone: Secondary | ICD-10-CM | POA: Diagnosis not present

## 2016-06-09 DIAGNOSIS — K589 Irritable bowel syndrome without diarrhea: Secondary | ICD-10-CM | POA: Diagnosis present

## 2016-06-09 DIAGNOSIS — R531 Weakness: Secondary | ICD-10-CM

## 2016-06-09 DIAGNOSIS — D630 Anemia in neoplastic disease: Secondary | ICD-10-CM | POA: Diagnosis present

## 2016-06-09 DIAGNOSIS — Z8249 Family history of ischemic heart disease and other diseases of the circulatory system: Secondary | ICD-10-CM

## 2016-06-09 DIAGNOSIS — Z66 Do not resuscitate: Secondary | ICD-10-CM | POA: Diagnosis present

## 2016-06-09 DIAGNOSIS — Z800 Family history of malignant neoplasm of digestive organs: Secondary | ICD-10-CM

## 2016-06-09 DIAGNOSIS — D649 Anemia, unspecified: Secondary | ICD-10-CM

## 2016-06-09 DIAGNOSIS — Z23 Encounter for immunization: Secondary | ICD-10-CM

## 2016-06-09 DIAGNOSIS — Z882 Allergy status to sulfonamides status: Secondary | ICD-10-CM

## 2016-06-09 DIAGNOSIS — L98429 Non-pressure chronic ulcer of back with unspecified severity: Secondary | ICD-10-CM | POA: Diagnosis present

## 2016-06-09 DIAGNOSIS — E43 Unspecified severe protein-calorie malnutrition: Secondary | ICD-10-CM | POA: Diagnosis present

## 2016-06-09 DIAGNOSIS — Z6820 Body mass index (BMI) 20.0-20.9, adult: Secondary | ICD-10-CM

## 2016-06-09 DIAGNOSIS — J90 Pleural effusion, not elsewhere classified: Secondary | ICD-10-CM | POA: Diagnosis present

## 2016-06-09 DIAGNOSIS — C3411 Malignant neoplasm of upper lobe, right bronchus or lung: Secondary | ICD-10-CM | POA: Diagnosis present

## 2016-06-09 DIAGNOSIS — R740 Nonspecific elevation of levels of transaminase and lactic acid dehydrogenase [LDH]: Secondary | ICD-10-CM | POA: Diagnosis present

## 2016-06-09 DIAGNOSIS — Z79899 Other long term (current) drug therapy: Secondary | ICD-10-CM

## 2016-06-09 DIAGNOSIS — Z833 Family history of diabetes mellitus: Secondary | ICD-10-CM

## 2016-06-09 DIAGNOSIS — Z8701 Personal history of pneumonia (recurrent): Secondary | ICD-10-CM

## 2016-06-09 LAB — CBC WITH DIFFERENTIAL/PLATELET
BASOS ABS: 0 10*3/uL (ref 0.0–0.1)
BASOS PCT: 0 %
EOS ABS: 0.1 10*3/uL (ref 0.0–0.7)
Eosinophils Relative: 1 %
HEMATOCRIT: 19 % — AB (ref 36.0–46.0)
Hemoglobin: 5.9 g/dL — CL (ref 12.0–15.0)
Lymphocytes Relative: 4 %
Lymphs Abs: 0.3 10*3/uL — ABNORMAL LOW (ref 0.7–4.0)
MCH: 30.9 pg (ref 26.0–34.0)
MCHC: 31.1 g/dL (ref 30.0–36.0)
MCV: 99.5 fL (ref 78.0–100.0)
MONO ABS: 1 10*3/uL (ref 0.1–1.0)
Monocytes Relative: 13 %
NEUTROS ABS: 6.3 10*3/uL (ref 1.7–7.7)
NEUTROS PCT: 82 %
Platelets: 185 10*3/uL (ref 150–400)
RBC: 1.91 MIL/uL — ABNORMAL LOW (ref 3.87–5.11)
RDW: 18.4 % — AB (ref 11.5–15.5)
WBC: 7.7 10*3/uL (ref 4.0–10.5)

## 2016-06-09 LAB — COMPREHENSIVE METABOLIC PANEL
ALK PHOS: 73 U/L (ref 38–126)
ALT: 111 U/L — ABNORMAL HIGH (ref 14–54)
ANION GAP: 7 (ref 5–15)
AST: 94 U/L — ABNORMAL HIGH (ref 15–41)
Albumin: 2.5 g/dL — ABNORMAL LOW (ref 3.5–5.0)
BILIRUBIN TOTAL: 0.4 mg/dL (ref 0.3–1.2)
BUN: 30 mg/dL — ABNORMAL HIGH (ref 6–20)
CALCIUM: 8.3 mg/dL — AB (ref 8.9–10.3)
CO2: 25 mmol/L (ref 22–32)
Chloride: 107 mmol/L (ref 101–111)
Creatinine, Ser: 0.66 mg/dL (ref 0.44–1.00)
GFR calc Af Amer: >60 mL/min (ref >60)
Glucose, Bld: 110 mg/dL — ABNORMAL HIGH (ref 65–99)
POTASSIUM: 4 mmol/L (ref 3.5–5.1)
Sodium: 139 mmol/L (ref 135–145)
TOTAL PROTEIN: 6.7 g/dL (ref 6.5–8.1)

## 2016-06-09 LAB — I-STAT CG4 LACTIC ACID, ED: LACTIC ACID, VENOUS: 0.68 mmol/L (ref 0.5–1.9)

## 2016-06-09 MED ORDER — SODIUM CHLORIDE 0.9 % IV BOLUS (SEPSIS)
1000.0000 mL | Freq: Once | INTRAVENOUS | Status: AC
  Administered 2016-06-09: 1000 mL via INTRAVENOUS

## 2016-06-09 MED ORDER — SODIUM CHLORIDE 0.9 % IV SOLN
Freq: Once | INTRAVENOUS | Status: AC
  Administered 2016-06-10: 03:00:00 via INTRAVENOUS

## 2016-06-09 NOTE — ED Notes (Signed)
Patient transported to X-ray 

## 2016-06-09 NOTE — ED Provider Notes (Addendum)
[facility] DEPT Provider Note   CSN: [id number] Arrival date & time: 06/09/16  2155   By signing my name below, I, Macon Large, attest that this documentation has been prepared under the direction and in the presence of Rolland Porter, MD. Electronically Signed: Macon Large, ED Scribe. 06/09/16. 11:39 PM.  Time seen 23:09 PM  History   Chief Complaint Chief Complaint  Patient presents with  . Weakness   The history is provided by the patient and a caregiver. No language interpreter was used.   HPI Comments: LAGUANA DESAUTEL is a 65 y.o. female brought in by EMS who presents to the Emergency Department complaining of moderate, constant, generalized weakness onset two weeks ago. Pt notes she was last seen in ED on 05/14/2016 for similar symptoms. Pt reports being unable to ambulate due to associated weakness in bilateral lower extremities. She also notes having accompanied appetite loss with intermittent episodes of emesis with nausea. Pt states her weakness began after her last radiation and chemotherapy session. Per pt's caregiver, Pt has not gone to chemotherapy or radiation for 10 weeks now. Pt notes her chemotherapy doctor is [place]. Pt also reports fever and cough with SOB. She notes her cough has been constant and has not changed from her baseline. Pt's brother notes she had a fever PTA at home of 100.5. Pt reports her SOB is gradually worsened with exertion, especially when she is getting up to use the restroom. Pt's caregiver does note that pt has began having a bit of confusion the last couple of weeks. She reports no modifying factors noted. She denies chills, dysuria. She states she feels thirsty.   Oncology Dr Julien Nordmann  Past Medical History:  Diagnosis Date  . Allergy    seasonal  . Bone cancer ([place]) 01/10/15 PET   lytic lesion lt 1st rib,T9,L3 Lt acetabulum  . Brain cancer ([place]) 01/13/15   MRI multiple small brain mets 10 individual lesions  . Bronchitis   . H/O reactive  hypoglycemia   . IBS (irritable bowel syndrome)   . Lung cancer ([place])    stageIV nscca rul  . Osteopenia   . Paronychia 11/17/2015  . Pneumonia 2009 & 2011  . S/P radiation therapy completed 02/08/15   whole brain ,spine  . S/P radiation therapy completed 11/27/15   lung    Patient Active Problem List   Diagnosis Date Noted  . Symptomatic anemia 06/10/2016  . Protein-calorie malnutrition, severe 05/15/2016  . Emesis 05/14/2016  . Metastatic cancer ([place])   . Weakness 05/11/2016  . Aphasia 03/12/2016  . Pain management 03/09/2016  . Dehydration 03/09/2016  . 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 Brain metastases ([place]) 02/14/2016  . Paronychia 11/17/2015  . Paronychia of great toe, right 08/21/2015  . Paronychia of great toe, left 08/21/2015  . Nausea without vomiting 04/24/2015  . Feeding difficulties 04/11/2015  . Encounter for antineoplastic chemotherapy 03/06/2015  . Hypokalemia 02/15/2015  . Protein-calorie malnutrition ([place]) 02/15/2015  . Neutropenic fever ([place]) 02/11/2015  . Neutropenia with fever ([place]) 02/11/2015  . Bicytopenia 02/11/2015  . Diarrhea 02/11/2015  . Malignant neoplasm of upper lobe of right lung ([place]) 01/05/2015  . 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 Metastasis to bone ([place]) 01/03/2015  . Physical exam 10/13/2014  . Osteopenia 10/07/2013  . Benign positional vertigo 05/08/2012  . TINNITUS, LEFT 01/09/2009  . CARDIAC MURMUR, AORTIC 01/05/2007  . MOTION SICKNESS 01/05/2007    Past Surgical History:  Procedure Laterality Date  . COLONOSCOPY  2009   Negative; [facility]  . ESOPHAGOGASTRODUODENOSCOPY (EGD) WITH PROPOFOL N/A 04/11/2015   Procedure: ESOPHAGOGASTRODUODENOSCOPY (EGD) WITH PROPOFOL;  Surgeon: Carol Ada, MD;  Location: [facility];  Service: Endoscopy;  Laterality: N/A;  . PEG PLACEMENT N/A 04/11/2015   Procedure: PERCUTANEOUS ENDOSCOPIC GASTROSTOMY (PEG) PLACEMENT;  Surgeon: Carol Ada, MD;  Location: [facility] END[facility];  Service:  Endoscopy;  Laterality: N/A;  . TONSILLECTOMY AND ADENOIDECTOMY    . WISDOM TOOTH EXTRACTION      OB History    No data available       Home Medications    Prior to Admission medications   Medication Sig Start Date End Date Taking? Authorizing Provider  acetaminophen (TYLENOL) 325 MG tablet Take 650 mg by mouth every 6 (six) hours as needed for mild pain, moderate pain, fever or headache.    Yes Historical Provider, MD  Amino Acids-Protein Hydrolys (FEEDING SUPPLEMENT, PRO-STAT SUGAR FREE 64,) LIQD Take 30 mLs by mouth 2 (two) times daily. 05/16/16  Yes Hosie Poisson, MD  FeFum-FePoly-FA-B Cmp-C-Biot (INTEGRA PLUS) CAPS Take 1 capsule by mouth every morning. 05/24/16  Yes Curt Bears, MD  folic acid (FOLVITE) 1 MG tablet Take 1 tablet (1 mg total) by mouth daily. Patient taking differently: Take 1 mg by mouth every morning.  05/13/16  Yes Curt Bears, MD  HYDROcodone-acetaminophen (NORCO) 5-325 MG tablet Take 1-2 tablets by mouth every 4 (four) hours as needed for moderate pain. 02/14/16  Yes Hayden Pedro, PA-C  levETIRAcetam (KEPPRA) 500 MG tablet 1 tablet in the morning and 1.5 tablets in the evening Patient taking differently: Take 500-750 mg by mouth 2 (two) times daily. 1 tablet in the morning and 1.5 tablets in the evening 04/30/16  Yes Kathrynn Ducking, MD  Menthol, Topical Analgesic, (BIOFREEZE) 4 % GEL Apply 1 application topically 4 (four) times daily as needed (pain). Apply to trapezius    Yes Historical Provider, MD  Nutritional Supplements (FEEDING SUPPLEMENT, OSMOLITE 1.5 CAL,) LIQD Place 237 mLs into feeding tube 4 (four) times daily. Patient taking differently: Place 237 mLs into feeding tube 3 (three) times daily.  04/14/15  Yes Carol Ada, MD  OLANZapine (ZYPREXA) 10 MG tablet TAKE 1 TABLET (10 MG TOTAL) BY MOUTH AT BEDTIME. (INS PAYS 12/13/2015) 05/24/16  Yes Curt Bears, MD  polyethylene glycol (MIRALAX / GLYCOLAX) packet Take 17 g by mouth daily as  needed. Patient taking differently: Take 17 g by mouth daily as needed for mild constipation or moderate constipation.  05/16/16  Yes Hosie Poisson, MD  senna-docusate (SENOKOT-S) 8.6-50 MG tablet Take 1 tablet by mouth 2 (two) times daily. 05/16/16  Yes Hosie Poisson, MD  traMADol (ULTRAM) 50 MG tablet Take 1 tablet (50 mg total) by mouth every 6 (six) hours as needed. Patient taking differently: Take 50 mg by mouth every 6 (six) hours as needed for moderate pain or severe pain.  02/19/16  Yes Curt Bears, MD  osimertinib mesylate (TAGRISSO) 80 MG tablet Take 1 tablet (80 mg total) by mouth daily. Patient not taking: Reported on 06/09/2016 05/29/16   Curt Bears, MD    Family History Family History  Problem Relation Age of Onset  . Transient ischemic attack Mother   . Mental illness Mother     Dementia  . Diabetes Father   . Heart attack Father 22  . Heart attack Maternal Grandfather     in 85s  . Stomach cancer Maternal Aunt   . Heart attack Maternal Aunt  in 98s  . Diabetes Paternal Uncle     X 3    Social History Social History  Substance Use Topics  . Smoking status: Never Smoker  . Smokeless tobacco: Never Used  . Alcohol use Yes     Comment: occasionally rare wine  lives with friend   Allergies   Sulfonamide derivatives   Review of Systems Review of Systems  Constitutional: Positive for appetite change and fever. Negative for chills.  Respiratory: Positive for cough.   Gastrointestinal: Positive for nausea and vomiting.  Genitourinary: Negative for dysuria.  Neurological: Positive for weakness.  Psychiatric/Behavioral: Positive for confusion.  All other systems reviewed and are negative.    Physical Exam Updated Vital Signs BP 109/73 (BP Location: Right Arm)   Pulse 109   Temp 97.9 F (36.6 C) (Oral)   Resp 18   Ht '5\' 7"'$  (1.702 m)   Wt 133 lb (60.3 kg)   SpO2 93%   BMI 20.83 kg/m   Vital signs normal except for tachycardia   Physical  Exam  Constitutional: She is oriented to person, place, and time. She appears well-developed.  Non-toxic appearance. She does not appear ill. No distress.  Appears weak.   HENT:  Head: Normocephalic and atraumatic.  Right Ear: External ear normal.  Left Ear: External ear normal.  Nose: Nose normal. No mucosal edema or rhinorrhea.  Mouth/Throat: Mucous membranes are normal. No dental abscesses or uvula swelling.  Tongue was dry.   Eyes: Conjunctivae and EOM are normal. Pupils are equal, round, and reactive to light.  Neck: Normal range of motion and full passive range of motion without pain. Neck supple.  Cardiovascular: Regular rhythm and normal heart sounds.  Tachycardia present.  Exam reveals no gallop and no friction rub.   No murmur heard. Tachycardia.   Pulmonary/Chest: Effort normal and breath sounds normal. No respiratory distress. She has no wheezes. She has no rhonchi. She has no rales. She exhibits no tenderness and no crepitus.  Abdominal: Soft. Normal appearance and bowel sounds are normal. She exhibits no distension. There is no tenderness. There is no rebound and no guarding.  She has a PEG in the upper abdomen.   Musculoskeletal: Normal range of motion. She exhibits no edema or tenderness.  Moves all extremities well.   Neurological: She is alert and oriented to person, place, and time. She has normal strength. No cranial nerve deficit.  Skin: Skin is warm, dry and intact. No rash noted. No erythema. There is pallor.  Psychiatric: Her mood appears not anxious. Her speech is delayed. She is slowed.  Flat affect  Nursing note and vitals reviewed.    ED Treatments / Results  Labs (all labs ordered are listed, but only abnormal results are displayed) Results for orders placed or performed during the hospital encounter of 06/09/16  Comprehensive metabolic panel  Result Value Ref Range   Sodium 139 135 - 145 mmol/L   Potassium 4.0 3.5 - 5.1 mmol/L   Chloride 107 101 - 111  mmol/L   CO2 25 22 - 32 mmol/L   Glucose, Bld 110 (H) 65 - 99 mg/dL   BUN 30 (H) 6 - 20 mg/dL   Creatinine, Ser 0.66 0.44 - 1.00 mg/dL   Calcium 8.3 (L) 8.9 - 10.3 mg/dL   Total Protein 6.7 6.5 - 8.1 g/dL   Albumin 2.5 (L) 3.5 - 5.0 g/dL   AST 94 (H) 15 - 41 U/L   ALT 111 (H) 14 - 54 U/L  Alkaline Phosphatase 73 38 - 126 U/L   Total Bilirubin 0.4 0.3 - 1.2 mg/dL   GFR calc non Af Amer >60 >60 mL/min   GFR calc Af Amer >60 >60 mL/min   Anion gap 7 5 - 15  CBC with Differential  Result Value Ref Range   WBC 7.7 4.0 - 10.5 K/uL   RBC 1.91 (L) 3.87 - 5.11 MIL/uL   Hemoglobin 5.9 (LL) 12.0 - 15.0 g/dL   HCT 19.0 (L) 36.0 - 46.0 %   MCV 99.5 78.0 - 100.0 fL   MCH 30.9 26.0 - 34.0 pg   MCHC 31.1 30.0 - 36.0 g/dL   RDW 18.4 (H) 11.5 - 15.5 %   Platelets 185 150 - 400 K/uL   Neutrophils Relative % 82 %   Neutro Abs 6.3 1.7 - 7.7 K/uL   Lymphocytes Relative 4 %   Lymphs Abs 0.3 (L) 0.7 - 4.0 K/uL   Monocytes Relative 13 %   Monocytes Absolute 1.0 0.1 - 1.0 K/uL   Eosinophils Relative 1 %   Eosinophils Absolute 0.1 0.0 - 0.7 K/uL   Basophils Relative 0 %   Basophils Absolute 0.0 0.0 - 0.1 K/uL  Urinalysis, Routine w reflex microscopic  Result Value Ref Range   Color, Urine YELLOW YELLOW   APPearance CLEAR CLEAR   Specific Gravity, Urine 1.020 1.005 - 1.030   pH 7.0 5.0 - 8.0   Glucose, UA NEGATIVE NEGATIVE mg/dL   Hgb urine dipstick TRACE (A) NEGATIVE   Bilirubin Urine NEGATIVE NEGATIVE   Ketones, ur NEGATIVE NEGATIVE mg/dL   Protein, ur NEGATIVE NEGATIVE mg/dL   Nitrite NEGATIVE NEGATIVE   Leukocytes, UA NEGATIVE NEGATIVE  Urine microscopic-add on  Result Value Ref Range   Squamous Epithelial / LPF NONE SEEN NONE SEEN   WBC, UA NONE SEEN 0 - 5 WBC/hpf   RBC / HPF 0-5 0 - 5 RBC/hpf   Bacteria, UA RARE (A) NONE SEEN  I-Stat CG4 Lactic Acid, ED  Result Value Ref Range   Lactic Acid, Venous 0.68 0.5 - 1.9 mmol/L  Type and screen [place]  Result  Value Ref Range   ABO/RH(D) B NEG    Antibody Screen NEG    Sample Expiration 06/13/2016    Unit Number [id number]    Blood Component Type RED CELLS,LR    Unit division 00    Status of Unit ALLOCATED    Transfusion Status OK TO TRANSFUSE    Crossmatch Result Compatible    Unit Number [id number]    Blood Component Type RED CELLS,LR    Unit division 00    Status of Unit ALLOCATED    Transfusion Status OK TO TRANSFUSE    Crossmatch Result Compatible   Prepare RBC  Result Value Ref Range   Order Confirmation ORDER PROCESSED BY BLOOD BANK    Laboratory interpretation all normal except worsening of baseline anemia in 8's, elevated BUN c/w dehydration     EKG  EKG Interpretation  Date/Time:  Monday June 10 2016 02:15:25 EST Ventricular Rate:  109 PR Interval:    QRS Duration: 80 QT Interval:  348 QTC Calculation: 469 R Axis:   -27 Text Interpretation:  Sinus tachycardia Borderline left axis deviation Since last tracing rate faster 11 May 2016 Confirmed by [place]  MD-I, Verlon Carcione ([id number]) on 06/10/2016 2:21:25 AM       Radiology Dg Chest 2 View  Result Date: 06/09/2016 CLINICAL DATA:  Weakness for 3 days. History of lung,  bone and brain cancer EXAM: CHEST  2 VIEW COMPARISON:  05/14/2016 CXR, 05/11/2016 chest CT FINDINGS: Slight increase in moderate right effusion spanning at least 1.5 vertebral body heights. Apical mass in the right upper lobe is unchanged. No overt pulmonary edema. The heart is top-normal in size. No aortic aneurysm. Known osteoblastic appearing lesions of T9, L2 and L3 remain stable. IMPRESSION: Slight increase in right pleural effusion since the interim. Stable right apical masslike opacity. Osteoblastic disease involving T9, L2 and L3 remain grossly unchanged. Electronically Signed   By: Ashley Royalty M.D.   On: 06/09/2016 22:41    Procedures Procedures (including critical care time)  Medications Ordered in ED ondansetron [facility]) injection 4 mg (not  administered)  sodium chloride flush (NS) 0.9 % injection 3 mL (not administered)  sodium chloride 0.9 % bolus 1,000 mL (0 mLs Intravenous Stopped 06/10/16 0139)     Initial Impression / Assessment and Plan / ED Course  I have reviewed the triage vital signs and the nursing notes.  Pertinent labs & imaging results that were available during my care of the patient were reviewed by me and considered in my medical decision making (see chart for details).  Clinical Course    Clinical Course    DIAGNOSTIC STUDIES: Oxygen Saturation is 93% on RA, low by my interpretation.    COORDINATION OF CARE: 11:18 PM Discussed her test results, pt was typed and crossed for 2 units of PRC's and prepared for blood transfusion, treatment plan with pt at bedside which includes blood transfusion, IV bolus of NS for her dehydration and admisssion and pt agreed to plan.  01:05 AM Dr Blaine Hamper, hospitalist, admit to tele, admts  Final Clinical Impressions(s) / ED Diagnoses   Final diagnoses:  Weakness  Dehydration  Symptomatic anemia   Plan admission  Rolland Porter, MD, FACEP   I personally performed the services described in this documentation, which was scribed in my presence. The recorded information has been reviewed and considered.  Rolland Porter, MD, Barbette Or, MD 06/10/16 2947    Rolland Porter, MD 06/10/16 6546

## 2016-06-09 NOTE — ED Notes (Signed)
Bed: WA01 Expected date:  Expected time:  Means of arrival:  Comments: EMS possible sepsis

## 2016-06-09 NOTE — ED Triage Notes (Signed)
Per EMS, pt from home c/o weakness x3 days. Pt reports inability to stand, weakness progressively worsening. Rectal temp at home 100.5. Hx lung cancer with mets. DNR and family at bedside. Pt has PEG tube.

## 2016-06-09 NOTE — ED Notes (Signed)
Pt placed on monitor.  

## 2016-06-10 DIAGNOSIS — L98429 Non-pressure chronic ulcer of back with unspecified severity: Secondary | ICD-10-CM | POA: Diagnosis present

## 2016-06-10 DIAGNOSIS — Z833 Family history of diabetes mellitus: Secondary | ICD-10-CM | POA: Diagnosis not present

## 2016-06-10 DIAGNOSIS — K589 Irritable bowel syndrome without diarrhea: Secondary | ICD-10-CM | POA: Diagnosis present

## 2016-06-10 DIAGNOSIS — Z23 Encounter for immunization: Secondary | ICD-10-CM | POA: Diagnosis not present

## 2016-06-10 DIAGNOSIS — C3411 Malignant neoplasm of upper lobe, right bronchus or lung: Secondary | ICD-10-CM | POA: Diagnosis present

## 2016-06-10 DIAGNOSIS — C7931 Secondary malignant neoplasm of brain: Secondary | ICD-10-CM | POA: Diagnosis present

## 2016-06-10 DIAGNOSIS — J90 Pleural effusion, not elsewhere classified: Secondary | ICD-10-CM | POA: Diagnosis present

## 2016-06-10 DIAGNOSIS — D630 Anemia in neoplastic disease: Secondary | ICD-10-CM | POA: Diagnosis present

## 2016-06-10 DIAGNOSIS — D649 Anemia, unspecified: Secondary | ICD-10-CM | POA: Diagnosis not present

## 2016-06-10 DIAGNOSIS — Z79899 Other long term (current) drug therapy: Secondary | ICD-10-CM | POA: Diagnosis not present

## 2016-06-10 DIAGNOSIS — Z882 Allergy status to sulfonamides status: Secondary | ICD-10-CM | POA: Diagnosis not present

## 2016-06-10 DIAGNOSIS — C7951 Secondary malignant neoplasm of bone: Secondary | ICD-10-CM | POA: Diagnosis present

## 2016-06-10 DIAGNOSIS — E43 Unspecified severe protein-calorie malnutrition: Secondary | ICD-10-CM | POA: Diagnosis present

## 2016-06-10 DIAGNOSIS — R531 Weakness: Secondary | ICD-10-CM

## 2016-06-10 DIAGNOSIS — R740 Nonspecific elevation of levels of transaminase and lactic acid dehydrogenase [LDH]: Secondary | ICD-10-CM | POA: Diagnosis present

## 2016-06-10 DIAGNOSIS — Z66 Do not resuscitate: Secondary | ICD-10-CM | POA: Diagnosis present

## 2016-06-10 DIAGNOSIS — E860 Dehydration: Secondary | ICD-10-CM

## 2016-06-10 DIAGNOSIS — L89150 Pressure ulcer of sacral region, unstageable: Secondary | ICD-10-CM | POA: Diagnosis present

## 2016-06-10 DIAGNOSIS — Z931 Gastrostomy status: Secondary | ICD-10-CM | POA: Diagnosis not present

## 2016-06-10 DIAGNOSIS — Z8701 Personal history of pneumonia (recurrent): Secondary | ICD-10-CM | POA: Diagnosis not present

## 2016-06-10 DIAGNOSIS — Z8249 Family history of ischemic heart disease and other diseases of the circulatory system: Secondary | ICD-10-CM | POA: Diagnosis not present

## 2016-06-10 DIAGNOSIS — Z800 Family history of malignant neoplasm of digestive organs: Secondary | ICD-10-CM | POA: Diagnosis not present

## 2016-06-10 DIAGNOSIS — Z6820 Body mass index (BMI) 20.0-20.9, adult: Secondary | ICD-10-CM | POA: Diagnosis not present

## 2016-06-10 LAB — URINALYSIS, ROUTINE W REFLEX MICROSCOPIC
BILIRUBIN URINE: NEGATIVE
GLUCOSE, UA: NEGATIVE mg/dL
Ketones, ur: NEGATIVE mg/dL
Leukocytes, UA: NEGATIVE
Nitrite: NEGATIVE
PH: 7 (ref 5.0–8.0)
Protein, ur: NEGATIVE mg/dL
SPECIFIC GRAVITY, URINE: 1.02 (ref 1.005–1.030)

## 2016-06-10 LAB — VITAMIN B12: VITAMIN B 12: 330 pg/mL (ref 180–914)

## 2016-06-10 LAB — PREPARE RBC (CROSSMATCH)

## 2016-06-10 LAB — IRON AND TIBC
Iron: 44 ug/dL (ref 28–170)
SATURATION RATIOS: 30 % (ref 10.4–31.8)
TIBC: 146 ug/dL — ABNORMAL LOW (ref 250–450)
UIBC: 102 ug/dL

## 2016-06-10 LAB — FERRITIN: Ferritin: 2655 ng/mL — ABNORMAL HIGH (ref 11–307)

## 2016-06-10 LAB — BASIC METABOLIC PANEL
ANION GAP: 5 (ref 5–15)
BUN: 22 mg/dL — AB (ref 6–20)
CHLORIDE: 107 mmol/L (ref 101–111)
CO2: 25 mmol/L (ref 22–32)
Calcium: 7.9 mg/dL — ABNORMAL LOW (ref 8.9–10.3)
Creatinine, Ser: 0.78 mg/dL (ref 0.44–1.00)
Glucose, Bld: 110 mg/dL — ABNORMAL HIGH (ref 65–99)
POTASSIUM: 3.8 mmol/L (ref 3.5–5.1)
SODIUM: 137 mmol/L (ref 135–145)

## 2016-06-10 LAB — URINE MICROSCOPIC-ADD ON
SQUAMOUS EPITHELIAL / LPF: NONE SEEN
WBC UA: NONE SEEN WBC/hpf (ref 0–5)

## 2016-06-10 LAB — FOLATE: FOLATE: 79.2 ng/mL (ref >5.9)

## 2016-06-10 LAB — RETICULOCYTES
RBC.: 3.22 MIL/uL — AB (ref 3.87–5.11)
RETIC COUNT ABSOLUTE: 64.4 10*3/uL (ref 19.0–186.0)
RETIC CT PCT: 2 % (ref 0.4–3.1)

## 2016-06-10 MED ORDER — POLYETHYLENE GLYCOL 3350 17 G PO PACK: 17.0000 g | PACK | Freq: Every day | ORAL | Status: DC | PRN

## 2016-06-10 MED ORDER — MENTHOL (TOPICAL ANALGESIC) 4 % EX GEL: 1.0000 "application " | Freq: Four times a day (QID) | CUTANEOUS | Status: DC | PRN

## 2016-06-10 MED ORDER — INFLUENZA VAC SPLIT QUAD 0.5 ML IM SUSY
0.5000 mL | PREFILLED_SYRINGE | INTRAMUSCULAR | Status: AC
  Administered 2016-06-11: 0.5 mL via INTRAMUSCULAR
  Filled 2016-06-10: qty 0.5

## 2016-06-10 MED ORDER — COLLAGENASE 250 UNIT/GM EX OINT
TOPICAL_OINTMENT | Freq: Every day | CUTANEOUS | Status: DC
Start: 2016-06-10 — End: 2016-06-12
  Administered 2016-06-10 – 2016-06-11 (×2): via TOPICAL
  Filled 2016-06-10: qty 30

## 2016-06-10 MED ORDER — INTEGRA PLUS PO CAPS: 1.0000 | ORAL_CAPSULE | Freq: Every morning | ORAL | Status: DC

## 2016-06-10 MED ORDER — SODIUM CHLORIDE 0.9% FLUSH
3.0000 mL | Freq: Two times a day (BID) | INTRAVENOUS | Status: DC
  Administered 2016-06-10 (×3): 3 mL via INTRAVENOUS

## 2016-06-10 MED ORDER — GRX ANALGESIC BALM EX OINT
1.0000 "application " | TOPICAL_OINTMENT | Freq: Four times a day (QID) | CUTANEOUS | Status: DC | PRN
  Filled 2016-06-10: qty 28

## 2016-06-10 MED ORDER — OLANZAPINE 10 MG PO TABS
10.0000 mg | ORAL_TABLET | Freq: Every day | ORAL | Status: DC
  Administered 2016-06-10: 10 mg via ORAL
  Filled 2016-06-10 (×3): qty 1

## 2016-06-10 MED ORDER — FOLIC ACID 1 MG PO TABS
1.0000 mg | ORAL_TABLET | Freq: Every day | ORAL | Status: DC
  Administered 2016-06-10: 1 mg via ORAL
  Filled 2016-06-10: qty 1

## 2016-06-10 MED ORDER — OSMOLITE 1.5 CAL PO LIQD
237.0000 mL | Freq: Three times a day (TID) | ORAL | Status: DC
  Administered 2016-06-10 – 2016-06-11 (×5): 237 mL
  Filled 2016-06-10 (×6): qty 237

## 2016-06-10 MED ORDER — HYDROCODONE-ACETAMINOPHEN 5-325 MG PO TABS: 1.0000 | ORAL_TABLET | ORAL | Status: DC | PRN

## 2016-06-10 MED ORDER — ONDANSETRON HCL 4 MG/2ML IJ SOLN: 4.0000 mg | Freq: Three times a day (TID) | INTRAMUSCULAR | Status: DC | PRN

## 2016-06-10 MED ORDER — ZOLPIDEM TARTRATE 5 MG PO TABS: 5.0000 mg | ORAL_TABLET | Freq: Every evening | ORAL | Status: DC | PRN

## 2016-06-10 MED ORDER — SODIUM CHLORIDE 0.9 % IV SOLN
INTRAVENOUS | Status: DC
  Administered 2016-06-10 – 2016-06-11 (×2): via INTRAVENOUS

## 2016-06-10 MED ORDER — FE FUMARATE-B12-VIT C-FA-IFC PO CAPS
1.0000 | ORAL_CAPSULE | Freq: Every day | ORAL | Status: DC
  Administered 2016-06-10 – 2016-06-11 (×2): 1 via ORAL
  Filled 2016-06-10 (×3): qty 1

## 2016-06-10 MED ORDER — PRO-STAT SUGAR FREE PO LIQD
30.0000 mL | Freq: Two times a day (BID) | ORAL | Status: DC
  Administered 2016-06-10 – 2016-06-11 (×3): 30 mL via ORAL
  Filled 2016-06-10 (×3): qty 30

## 2016-06-10 MED ORDER — SENNOSIDES-DOCUSATE SODIUM 8.6-50 MG PO TABS
1.0000 | ORAL_TABLET | Freq: Two times a day (BID) | ORAL | Status: DC
  Administered 2016-06-10 – 2016-06-11 (×3): 1 via ORAL
  Filled 2016-06-10 (×3): qty 1

## 2016-06-10 MED ORDER — LEVETIRACETAM 750 MG PO TABS
750.0000 mg | ORAL_TABLET | Freq: Every day | ORAL | Status: DC
  Administered 2016-06-10 (×2): 750 mg via ORAL
  Filled 2016-06-10 (×4): qty 1

## 2016-06-10 MED ORDER — ACETAMINOPHEN 325 MG PO TABS: 650.0000 mg | ORAL_TABLET | Freq: Four times a day (QID) | ORAL | Status: DC | PRN

## 2016-06-10 MED ORDER — LEVETIRACETAM 500 MG PO TABS
500.0000 mg | ORAL_TABLET | Freq: Every day | ORAL | Status: DC
  Administered 2016-06-10 – 2016-06-11 (×2): 500 mg via ORAL
  Filled 2016-06-10 (×3): qty 1

## 2016-06-10 MED ORDER — ENSURE ENLIVE PO LIQD
237.0000 mL | Freq: Two times a day (BID) | ORAL | Status: DC
  Administered 2016-06-10: 237 mL via ORAL

## 2016-06-10 NOTE — Progress Notes (Signed)
PROGRESS NOTE                                                                                                                                                                                                             Patient Demographics:    Laura Walter, is a 65 y.o. female, DOB - 1951-07-31, JSH:[id number]  Admit date - 06/09/2016   Admitting Physician Ivor Costa, MD  Outpatient Primary MD for the patient is [name], MD  LOS - 0  Outpatient Specialists: Dr. Julien Nordmann Dr. Lisbeth Renshaw  Chief Complaint  Patient presents with  . 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 Patient denies any chills, chest pain, shortness of breath, abdominal pain, nausea, vomiting, diarrhea or dysuria. Has chronic cough. In the ED she was found to have hemoglobin of 5.9 (drop from 8.2 at baseline), stable renal function and electrolytes. Vitals were stable. Chest x-ray showed chronic right pleural effusion. Patient admitted for transfusion and further management.   Subjective:   Still feels very weak. Received 2 units PRBC with good improvement in her hemoglobin.  Assessment  & Plan :    Principal Problem:   Symptomatic anemia Possibly due to underlying malignancy. Hemoglobin improved appropriately after 2 units blood transfusion (9.6). Check stool for Hemoccult, iron panel, TSH.  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 Getting radiation therapy with Dr. Lisbeth Renshaw. Continue pain control with when necessary Vicodin. Keppra for seizure prophylaxis.  Generalized weakness Secondary to underlying malignancy and symptomatic anemia. PT recommends SNF versus 24 our supervision at home.    Protein-calorie malnutrition,  severe Continue  Tube feeds.  Unstageable sacral pressure injury ([place]) Appreciate wound care evaluation.      Code Status : DO NOT RESUSCITATE  Family Communication  : Partner at bedside  Disposition Plan  : Home with home health possibly tomorrow  Barriers For Discharge : Active symptoms  Consults  :  None  Procedures  : None  DVT Prophylaxis  : SCDs  Lab Results  Component Value Date   PLT 185 06/09/2016    Antibiotics  :   Anti-infectives  None        Objective:   Vitals:   06/10/16 0742 06/10/16 0809 06/10/16 1041 06/10/16 1244  BP: 107/71 112/72 111/76   Pulse: 100 99 (!) 103 (!) 120  Resp:  18 16   Temp: 99.5 F (37.5 C) 98.9 F (37.2 C) 98.5 F (36.9 C)   TempSrc: Axillary Axillary Axillary   SpO2: 97%  100%   Weight:      Height:        Wt Readings from Last 3 Encounters:  06/09/16 60.3 kg (133 lb)  05/29/16 60.3 kg (133 lb)  05/11/16 59.6 kg (131 lb 6.3 oz)     Intake/Output Summary (Last 24 hours) at 06/10/16 1343 Last data filed at 06/10/16 1041  Gross per 24 hour  Intake             1889 ml  Output                0 ml  Net             1889 ml     Physical Exam  Gen: Appears weak HEENT: Pallor present, moist mucosa, supple neck Chest: clear b/l, no added sounds CVS: N S1&S2, no murmurs,  GI: soft, NT, ND,  PEG tube+ Musculoskeletal: warm, no edema CNS: Alert and oriented    Data Review:    CBC  Recent Labs Lab 06/09/16 2212  WBC 7.7  HGB 5.9*  HCT 19.0*  PLT 185  MCV 99.5  MCH 30.9  MCHC 31.1  RDW 18.4*  LYMPHSABS 0.3*  MONOABS 1.0  EOSABS 0.1  BASOSABS 0.0    Chemistries   Recent Labs Lab 06/09/16 2212  NA 139  K 4.0  CL 107  CO2 25  GLUCOSE 110*  BUN 30*  CREATININE 0.66  CALCIUM 8.3*  AST 94*  ALT 111*  ALKPHOS 73  BILITOT 0.4   ------------------------------------------------------------------------------------------------------------------ No results for input(s): CHOL, HDL,  LDLCALC, TRIG, CHOLHDL, LDLDIRECT in the last 72 hours.  No results found for: HGBA1C ------------------------------------------------------------------------------------------------------------------ No results for input(s): TSH, T4TOTAL, T3FREE, THYROIDAB in the last 72 hours.  Invalid input(s): FREET3 ------------------------------------------------------------------------------------------------------------------ No results for input(s): VITAMINB12, FOLATE, FERRITIN, TIBC, IRON, RETICCTPCT in the last 72 hours.  Coagulation profile No results for input(s): INR, PROTIME in the last 168 hours.  No results for input(s): DDIMER in the last 72 hours.  Cardiac Enzymes No results for input(s): CKMB, TROPONINI, MYOGLOBIN in the last 168 hours.  Invalid input(s): CK ------------------------------------------------------------------------------------------------------------------ No results found for: BNP  Inpatient Medications  Scheduled Meds: . collagenase   Topical Daily  . feeding supplement (ENSURE ENLIVE)  237 mL Oral BID BM  . feeding supplement (OSMOLITE 1.5 CAL)  237 mL Per Tube TID  . feeding supplement (PRO-STAT SUGAR FREE 64)  30 mL Oral BID  . ferrous [id number] C-folic acid  1 capsule Oral Daily  . folic acid  1 mg Oral Daily  . [START ON 06/11/2016] Influenza vac split quadrivalent PF  0.5 mL Intramuscular Tomorrow-1000  . levETIRAcetam  500 mg Oral Daily  . levETIRAcetam  750 mg Oral QHS  . OLANZapine  10 mg Oral QHS  . senna-docusate  1 tablet Oral BID  . sodium chloride flush  3 mL Intravenous Q12H   Continuous Infusions: . sodium chloride 100 mL/hr at 06/10/16 1203   PRN Meds:.acetaminophen, GRX ANALGESIC BALM, HYDROcodone-acetaminophen, ondansetron, polyethylene glycol, zolpidem  Micro Results No results found for this or any previous visit (from the  past 240 hour(s)).  Radiology Reports Dg Chest 2 View  Result Date: 06/09/2016 CLINICAL DATA:   Weakness for 3 days. History of lung, bone and brain cancer EXAM: CHEST  2 VIEW COMPARISON:  05/14/2016 CXR, 05/11/2016 chest CT FINDINGS: Slight increase in moderate right effusion spanning at least 1.5 vertebral body heights. Apical mass in the right upper lobe is unchanged. No overt pulmonary edema. The heart is top-normal in size. No aortic aneurysm. Known osteoblastic appearing lesions of T9, L2 and L3 remain stable. IMPRESSION: Slight increase in right pleural effusion since the interim. Stable right apical masslike opacity. Osteoblastic disease involving T9, L2 and L3 remain grossly unchanged. Electronically Signed   By: Ashley Royalty M.D.   On: 06/09/2016 22:41   Ct Head W & Wo Contrast  Result Date: 05/11/2016 CLINICAL DATA:  Follow-up lung cancer. EXAM: CT HEAD WITHOUT AND WITH CONTRAST CT CHEST, ABDOMEN AND PELVIS WITH CONTRAST TECHNIQUE: Contiguous axial images were obtained from the base of the skull through the vertex without and with intravenous contrast. Multidetector CT imaging of the chest, abdomen and pelvis was performed following the standard protocol during bolus administration of intravenous contrast. CONTRAST:  119m ISOVUE-300 IOPAMIDOL (ISOVUE-300) INJECTION 61%, 368mISOVUE-300 IOPAMIDOL (ISOVUE-300) INJECTION 61%<Contrast>10083mSOVUE-300 IOPAMIDOL (ISOVUE-300) INJECTION 61%, 30m84mOVUE-300 IOPAMIDOL (ISOVUE-300) INJECTION 61% COMPARISON:  CT scan of the brain March 09, 2016, MRI brain March 10, 2016, and CT of the chest/abdomen/ pelvis January 15, 2016 FINDINGS: CT HEAD FINDINGS Brain: No subdural, epidural, or subarachnoid. High attenuation in the right basal ganglia on series 2, image 12 is consistent with calcification. No intraparenchymal hemorrhage is identified. White matter changes are stable and severe. No acute cortical ischemia or infarct. The cerebellum and brainstem are normal. The basal cisterns are unremarkable. No midline shift. No enhancing metastatic disease identified.  Vascular: No hyperdense vessel or unexpected calcification. Skull: Normal. Negative for fracture or focal lesion. Sinuses/Orbits: No acute finding. Other: No other abnormalities. CT CHEST FINDINGS Cardiovascular: No significant vascular findings. Normal heart size. Mediastinum/Nodes: No adenopathy is seen within the chest. The thyroid is normal in appearance. The thoracic aorta is non aneurysmal. A small amount of pericardial fluid is stable. The heart size is within normal limits. Mild coronary artery calcifications are noted. The central great vessels otherwise normal. Lungs/Pleura: The central airways are normal. No pneumothorax. The timing of the contrast is suboptimal limiting evaluation of the patient's known lung malignancies. The 2 separate lesions in the right upper chest on the previous study are difficult to differentiate on today's study. Additionally, there is increased infiltrate surrounding the known malignancy. The subpleural nodule seen on the previous study is difficult to measure due to timing of contrast. However, it appears to be similar in size measuring 2.0 x 2.7 cm today versus 2.3 x 2.7 cm previously. The larger slightly more inferior mass is also difficult to measure but measures approximately 3.0 by 2.8 cm today versus 3.5 x 3.2 cm previously. There is increased infiltrate surrounding this mass and extending towards the suprahilar region on the right. By report, the patient's last radiation therapy to the lung was April 2017. The infiltrate in the medial right lower lobe is likely unchanged but obscured by an enlarging right-sided pleural effusion and atelectasis. The right-sided pleural effusion is larger in the interval. There is a small left effusion now present as well. There is interlobular septal thickening in the right upper lobe which is mild but new. No other changes on the right. There is  atelectasis in the left base. No suspicious nodules or masses are seen in the left lung.  Musculoskeletal: The mixed lytic and sclerotic lesion with pathologic fracture in T9 is stable. A sclerotic focus the anterior aspect of C7 is stable. A sclerotic focus near the superior endplate of X90 is stable. Sclerosis in the manubrium is unchanged. Sclerosis in the anterior eleventh seventh rib is stable. Sclerosis in the posterior left fifth rib is stable. No entirely new bony metastatic lesions are seen within the chest. CT ABDOMEN AND PELVIS FINDINGS Hepatobiliary: Evaluation of the parenchymal organs is limited due to timing of contrast. There is a cyst in the left hepatic lobe. Another seen in the right hepatic lobe. No liver metastases are identified. The gallbladder and portal vein are unremarkable. Pancreas: No mass, inflammatory changes, or other significant abnormality. Spleen: Within normal limits in size and appearance. Adrenals/Urinary Tract: The adrenal glands are normal in appearance. No suspicious renal masses are seen. There is a nodule however posterior to the left kidney on series 2, image 59 measuring 14 by 12 mm, new in the interval. Stomach/Bowel: A PEG tube is identified. The stomach and small bowel are otherwise normal. The colon and appendix are unremarkable. The Vascular/Lymphatic: The abdominal aorta is non aneurysmal. Mild atherosclerotic change in the aorta. No adenopathy. Reproductive: No mass or other significant abnormality. Musculoskeletal: A tiny sclerotic focus in T12 is stable. Metastatic disease and L2 and L3 with endplate irregularity is stable. No disease and L4. Sclerosis in the lateral aspect of L5 is stable. 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 No new bony metastatic disease seen in the abdomen or pelvis today. Other: There is a small amount of free fluid in the pelvis which is stable. No additional suspicious abnormalities. IMPRESSION: 1. No intracranial abnormalities identified. No metastatic disease. MRI imaging would be more sensitive  and specific for metastatic disease in the brain. 2. Evaluation of the lesions in the chest is limited due to poor timing of contrast. The subpleural nodule in the right upper lobe is similar to slightly smaller in the interval. The larger more inferior nodule in the right upper lobe is a little smaller in the interval. Increased opacity adjacent to these right upper lobe masses, extending centrally towards the right hilum is nonspecific and could represent an infectious or inflammatory process. Evolving postradiation change or worsening malignancy are also possibilities. Recommend attention on follow-up. Interlobular septal thickening in the right upper lobe is mild but new and lymphangitic spread of tumor is not excluded. 3. Bony metastatic disease is essentially stable in the interval. 4. There is a new soft tissue nodule posterior to the left kidney consistent with metastatic disease. This is new in the interval. Electronically Signed   By: Dorise Bullion III M.D   On: 05/11/2016 20:23   Ct Chest W Contrast  Result Date: 05/11/2016 CLINICAL DATA:  Follow-up lung cancer. EXAM: CT HEAD WITHOUT AND WITH CONTRAST CT CHEST, ABDOMEN AND PELVIS WITH CONTRAST TECHNIQUE: Contiguous axial images were obtained from the base of the skull through the vertex without and with intravenous contrast. Multidetector CT imaging of the chest, abdomen and pelvis was performed following the standard protocol during bolus administration of intravenous contrast. CONTRAST:  124m ISOVUE-300 IOPAMIDOL (ISOVUE-300) INJECTION 61%, 362mISOVUE-300 IOPAMIDOL (ISOVUE-300) INJECTION 61%<Contrast>1009mSOVUE-300 IOPAMIDOL (ISOVUE-300) INJECTION 61%, 74m13mOVUE-300 IOPAMIDOL (ISOVUE-300) INJECTION 61% COMPARISON:  CT scan of the brain March 09, 2016, MRI brain March 10, 2016, and CT of the chest/abdomen/  pelvis January 15, 2016 FINDINGS: CT HEAD FINDINGS Brain: No subdural, epidural, or subarachnoid. High attenuation in the right basal ganglia  on series 2, image 12 is consistent with calcification. No intraparenchymal hemorrhage is identified. White matter changes are stable and severe. No acute cortical ischemia or infarct. The cerebellum and brainstem are normal. The basal cisterns are unremarkable. No midline shift. No enhancing metastatic disease identified. Vascular: No hyperdense vessel or unexpected calcification. Skull: Normal. Negative for fracture or focal lesion. Sinuses/Orbits: No acute finding. Other: No other abnormalities. CT CHEST FINDINGS Cardiovascular: No significant vascular findings. Normal heart size. Mediastinum/Nodes: No adenopathy is seen within the chest. The thyroid is normal in appearance. The thoracic aorta is non aneurysmal. A small amount of pericardial fluid is stable. The heart size is within normal limits. Mild coronary artery calcifications are noted. The central great vessels otherwise normal. Lungs/Pleura: The central airways are normal. No pneumothorax. The timing of the contrast is suboptimal limiting evaluation of the patient's known lung malignancies. The 2 separate lesions in the right upper chest on the previous study are difficult to differentiate on today's study. Additionally, there is increased infiltrate surrounding the known malignancy. The subpleural nodule seen on the previous study is difficult to measure due to timing of contrast. However, it appears to be similar in size measuring 2.0 x 2.7 cm today versus 2.3 x 2.7 cm previously. The larger slightly more inferior mass is also difficult to measure but measures approximately 3.0 by 2.8 cm today versus 3.5 x 3.2 cm previously. There is increased infiltrate surrounding this mass and extending towards the suprahilar region on the right. By report, the patient's last radiation therapy to the lung was April 2017. The infiltrate in the medial right lower lobe is likely unchanged but obscured by an enlarging right-sided pleural effusion and atelectasis. The  right-sided pleural effusion is larger in the interval. There is a small left effusion now present as well. There is interlobular septal thickening in the right upper lobe which is mild but new. No other changes on the right. There is atelectasis in the left base. No suspicious nodules or masses are seen in the left lung. Musculoskeletal: The mixed lytic and sclerotic lesion with pathologic fracture in T9 is stable. A sclerotic focus the anterior aspect of C7 is stable. A sclerotic focus near the superior endplate of S01 is stable. Sclerosis in the manubrium is unchanged. Sclerosis in the anterior eleventh seventh rib is stable. Sclerosis in the posterior left fifth rib is stable. No entirely new bony metastatic lesions are seen within the chest. CT ABDOMEN AND PELVIS FINDINGS Hepatobiliary: Evaluation of the parenchymal organs is limited due to timing of contrast. There is a cyst in the left hepatic lobe. Another seen in the right hepatic lobe. No liver metastases are identified. The gallbladder and portal vein are unremarkable. Pancreas: No mass, inflammatory changes, or other significant abnormality. Spleen: Within normal limits in size and appearance. Adrenals/Urinary Tract: The adrenal glands are normal in appearance. No suspicious renal masses are seen. There is a nodule however posterior to the left kidney on series 2, image 59 measuring 14 by 12 mm, new in the interval. Stomach/Bowel: A PEG tube is identified. The stomach and small bowel are otherwise normal. The colon and appendix are unremarkable. The Vascular/Lymphatic: The abdominal aorta is non aneurysmal. Mild atherosclerotic change in the aorta. No adenopathy. Reproductive: No mass or other significant abnormality. Musculoskeletal: A tiny sclerotic focus in T12 is stable. Metastatic disease and L2  and L3 with endplate irregularity is stable. No disease and L4. Sclerosis in the lateral aspect of L5 is stable. Sclerotic metastatic disease in the pelvis  is essentially stable. No new bony metastatic disease seen in the abdomen or pelvis today. Other: There is a small amount of free fluid in the pelvis which is stable. No additional suspicious abnormalities. IMPRESSION: 1. No intracranial abnormalities identified. No metastatic disease. MRI imaging would be more sensitive and specific for metastatic disease in the brain. 2. Evaluation of the lesions in the chest is limited due to poor timing of contrast. The subpleural nodule in the right upper lobe is similar to slightly smaller in the interval. The larger more inferior nodule in the right upper lobe is a little smaller in the interval. Increased opacity adjacent to these right upper lobe masses, extending centrally towards the right hilum is nonspecific and could represent an infectious or inflammatory process. Evolving postradiation change or worsening malignancy are also possibilities. Recommend attention on follow-up. Interlobular septal thickening in the right upper lobe is mild but new and lymphangitic spread of tumor is not excluded. 3. Bony metastatic disease is essentially stable in the interval. 4. There is a new soft tissue nodule posterior to the left kidney consistent with metastatic disease. This is new in the interval. Electronically Signed   By: Dorise Bullion III M.D   On: 05/11/2016 20:23   Mr Brain W Wo Contrast  Result Date: 05/12/2016 CLINICAL DATA:  Metastatic lung cancer presenting with progressive generalized weakness. EXAM: MRI HEAD WITHOUT AND WITH CONTRAST TECHNIQUE: Multiplanar, multiecho pulse sequences of the brain and surrounding structures were obtained without and with intravenous contrast. CONTRAST:  60m MULTIHANCE GADOBENATE DIMEGLUMINE 529 MG/ML IV SOLN COMPARISON:  CT 05/11/2016. MRI 03/10/2016. Multiple previous MRIs as distant as 01/13/2015 FINDINGS: Brain: Generalized brain atrophy. No brainstem abnormality. Old small vessel infarctions in the cerebellum. Cerebral  hemispheres show diffuse white matter signal consistent with radiation change. 4 mm enhancing metastasis in the left posterior frontal vertex is unchanged. There is a new 2 mm metastasis within the inferior cerebellar vermis on the right. No mass effect or edema. No evidence of acute ischemic infarction. No hemorrhage, hydrocephalus or extra-axial collection. No pituitary mass. Vascular: Major vessels at the base of the brain show flow. Skull and upper cervical spine: Negative Sinuses/Orbits: Clear/normal Other: None IMPRESSION: No acute finding by MRI. Chronic white matter hyperintensity consistent with radiation change. 4 mm enhancing metastasis in the left posterior frontal vertex region is unchanged without enlargement since 03/10/2016. Newly seen 2 mm metastasis at the inferior cerebellum on the right without mass effect or vasogenic edema. No other new lesions seen. Electronically Signed   By: MNelson ChimesM.D.   On: 05/12/2016 12:55   Ct Abdomen Pelvis W Contrast  Result Date: 05/11/2016 CLINICAL DATA:  Follow-up lung cancer. EXAM: CT HEAD WITHOUT AND WITH CONTRAST CT CHEST, ABDOMEN AND PELVIS WITH CONTRAST TECHNIQUE: Contiguous axial images were obtained from the base of the skull through the vertex without and with intravenous contrast. Multidetector CT imaging of the chest, abdomen and pelvis was performed following the standard protocol during bolus administration of intravenous contrast. CONTRAST:  1060mISOVUE-300 IOPAMIDOL (ISOVUE-300) INJECTION 61%, 3033mSOVUE-300 IOPAMIDOL (ISOVUE-300) INJECTION 61%<Contrast>100m54mOVUE-300 IOPAMIDOL (ISOVUE-300) INJECTION 61%, 30mL26mVUE-300 IOPAMIDOL (ISOVUE-300) INJECTION 61% COMPARISON:  CT scan of the brain March 09, 2016, MRI brain March 10, 2016, and CT of the chest/abdomen/ pelvis January 15, 2016 FINDINGS: CT HEAD FINDINGS Brain: No subdural,  epidural, or subarachnoid. High attenuation in the right basal ganglia on series 2, image 12 is consistent with  calcification. No intraparenchymal hemorrhage is identified. White matter changes are stable and severe. No acute cortical ischemia or infarct. The cerebellum and brainstem are normal. The basal cisterns are unremarkable. No midline shift. No enhancing metastatic disease identified. Vascular: No hyperdense vessel or unexpected calcification. Skull: Normal. Negative for fracture or focal lesion. Sinuses/Orbits: No acute finding. Other: No other abnormalities. CT CHEST FINDINGS Cardiovascular: No significant vascular findings. Normal heart size. Mediastinum/Nodes: No adenopathy is seen within the chest. The thyroid is normal in appearance. The thoracic aorta is non aneurysmal. A small amount of pericardial fluid is stable. The heart size is within normal limits. Mild coronary artery calcifications are noted. The central great vessels otherwise normal. Lungs/Pleura: The central airways are normal. No pneumothorax. The timing of the contrast is suboptimal limiting evaluation of the patient's known lung malignancies. The 2 separate lesions in the right upper chest on the previous study are difficult to differentiate on today's study. Additionally, there is increased infiltrate surrounding the known malignancy. The subpleural nodule seen on the previous study is difficult to measure due to timing of contrast. However, it appears to be similar in size measuring 2.0 x 2.7 cm today versus 2.3 x 2.7 cm previously. The larger slightly more inferior mass is also difficult to measure but measures approximately 3.0 by 2.8 cm today versus 3.5 x 3.2 cm previously. There is increased infiltrate surrounding this mass and extending towards the suprahilar region on the right. By report, the patient's last radiation therapy to the lung was April 2017. The infiltrate in the medial right lower lobe is likely unchanged but obscured by an enlarging right-sided pleural effusion and atelectasis. The right-sided pleural effusion is larger in  the interval. There is a small left effusion now present as well. There is interlobular septal thickening in the right upper lobe which is mild but new. No other changes on the right. There is atelectasis in the left base. No suspicious nodules or masses are seen in the left lung. Musculoskeletal: The mixed lytic and sclerotic lesion with pathologic fracture in T9 is stable. A sclerotic focus the anterior aspect of C7 is stable. A sclerotic focus near the superior endplate of P37 is stable. Sclerosis in the manubrium is unchanged. Sclerosis in the anterior eleventh seventh rib is stable. Sclerosis in the posterior left fifth rib is stable. No entirely new bony metastatic lesions are seen within the chest. CT ABDOMEN AND PELVIS FINDINGS Hepatobiliary: Evaluation of the parenchymal organs is limited due to timing of contrast. There is a cyst in the left hepatic lobe. Another seen in the right hepatic lobe. No liver metastases are identified. The gallbladder and portal vein are unremarkable. Pancreas: No mass, inflammatory changes, or other significant abnormality. Spleen: Within normal limits in size and appearance. Adrenals/Urinary Tract: The adrenal glands are normal in appearance. No suspicious renal masses are seen. There is a nodule however posterior to the left kidney on series 2, image 59 measuring 14 by 12 mm, new in the interval. Stomach/Bowel: A PEG tube is identified. The stomach and small bowel are otherwise normal. The colon and appendix are unremarkable. The Vascular/Lymphatic: The abdominal aorta is non aneurysmal. Mild atherosclerotic change in the aorta. No adenopathy. Reproductive: No mass or other significant abnormality. Musculoskeletal: A tiny sclerotic focus in T12 is stable. Metastatic disease and L2 and L3 with endplate irregularity is stable. No disease and L4.  Sclerosis in the lateral aspect of L5 is stable. 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 No new bony  metastatic disease seen in the abdomen or pelvis today. Other: There is a small amount of free fluid in the pelvis which is stable. No additional suspicious abnormalities. IMPRESSION: 1. No intracranial abnormalities identified. No metastatic disease. MRI imaging would be more sensitive and specific for metastatic disease in the brain. 2. Evaluation of the lesions in the chest is limited due to poor timing of contrast. The subpleural nodule in the right upper lobe is similar to slightly smaller in the interval. The larger more inferior nodule in the right upper lobe is a little smaller in the interval. Increased opacity adjacent to these right upper lobe masses, extending centrally towards the right hilum is nonspecific and could represent an infectious or inflammatory process. Evolving postradiation change or worsening malignancy are also possibilities. Recommend attention on follow-up. Interlobular septal thickening in the right upper lobe is mild but new and lymphangitic spread of tumor is not excluded. 3. Bony metastatic disease is essentially stable in the interval. 4. There is a new soft tissue nodule posterior to the left kidney consistent with metastatic disease. This is new in the interval. Electronically Signed   By: Dorise Bullion III M.D   On: 05/11/2016 20:23   Dg Abd Acute W/chest  Result Date: 05/14/2016 CLINICAL DATA:  Weakness and emesis.  Pneumonia.  Lung cancer. EXAM: DG ABDOMEN ACUTE W/ 1V CHEST COMPARISON:  05/11/2016 and CT scan from 05/11/2016. FINDINGS: Right apical lung mass. Retrocardiac density on the right similar to prior. Volume loss on the right. Heart size within normal limits. T9 compression fracture. Left-side-down decubitus view the abdomen demonstrates no free intraperitoneal gas. There is contrast medium within the colon. No significant abnormal air-fluid levels are identified. There is contrast medium in the distal small bowel as well. Contrast is present in the rectum.  Left bony pelvic deformities from old healed fractures. Percutaneous gastrostomy tube noted. Mild levoconvex rotary scoliosis of the lumbar spine. Sclerosis at the L3 vertebral level compatible with known osseous metastatic disease in this vicinity. IMPRESSION: 1. Right apical lung mass with stable right retrocardiac density. 2. No appreciable change in the osseous metastatic disease. 3. No free air identified. Contrast medium in the bowel. No dilated bowel noted. Electronically Signed   By: Van Clines M.D.   On: 05/14/2016 15:32    Time Spent in minutes  20   Louellen Molder M.D on 06/10/2016 at 1:43 PM  Between 7am to 7pm - Pager - [phone]  After 7pm go to [email or web address] - password [facility]  [facility] -  Office  [phone]

## 2016-06-10 NOTE — Progress Notes (Signed)
Pt caregiver Monte at bedside. States she is able to care for pt at discharge and aware she is very weak and unable to be left alone.  States has lift at home if needed.

## 2016-06-10 NOTE — Progress Notes (Signed)
Pt temp 99.5 F prior to 2nd unti PRBC's.  MD notified and permission given to start 2nd unit.

## 2016-06-10 NOTE — Progress Notes (Signed)
LCSWA met with patient at bedside, explained reason for consult. Patient reports she plans to discharge home with caregiver and follow up with [facility] at [facility] outpatient PT. Patient presents no other concerns at this time.

## 2016-06-10 NOTE — ED Notes (Signed)
Consent to transfuse blood signed by pt's friend.  Pt gave verbal consent & requested friend sign.

## 2016-06-10 NOTE — Consult Note (Signed)
[place] Nurse wound consult note Reason for Consult:Unstageable pressure injury to sacrum, present on admission Wound type:pressure injury Pressure Ulcer POA: Yes Measurement:2 cm x 0.2 cm thin adherent slough to wound bed.  Wound WFU:XNATFT Drainage (amount, consistency, odor) Minimal serosanguinous  No odor.  Periwound:intact Dressing procedure/placement/frequency:Cleanse sacral wound with NS and pat gently dry.  Apply Santyl to wound bed.  Cover with NS moist gauze, ABD pad and tape.  Change daily.  Will not follow at this time.  Please re-consult if needed.  Domenic Moras RN BSN [place] Pager [phone]

## 2016-06-10 NOTE — H&P (Signed)
History and Physical    Laura Walter JAS:[id number] DOB: 10-10-1950 DOA: 06/09/2016  Referring MD/NP/PA:   PCP: [name], MD   Patient coming from:  The patient is coming from home.  At baseline, pt is partially dependent for most of ADL.    Chief Complaint: Generalized weakness and fever  HPI: Laura Walter is a 65 y.o. 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  Patient states that she has been feeling weak in the past 3 days, which has been progressively getting worse. She could not stand up normally. She had fever of 100.5 at home. She has chronic cough which has not changed, no chest pain. Patient denies rectal bleeding, hematemesis or hematuria. No nausea, vomiting, abdominal pain, diarrhea, symptoms of UTI. No unilateral numbness, vision change or hearing loss.   ED Course: pt was found to have hemoglobin dropped from 8.2 on 05/29/16-->5.9 today, WBC 7.7, lactate 0.68, negative urinalysis, electrolytes and renal function okay, temperature 97.9, tachycardia, tachypnea, and saturation 97% on room air, blood pressure soaps, chest x-ray showed right pleural effusion without new infiltration. Patient is admitted to telemetry bed as inpatient.  Review of Systems:   General: had fevers, chills, no changes in body weight, has poor appetite, has fatigue HEENT: no blurry vision, hearing changes or sore throat Respiratory: no dyspnea, has coughing, no wheezing CV: no chest pain, no palpitations GI: no nausea, vomiting, abdominal pain, diarrhea, constipation GU: no dysuria, burning on urination, increased urinary frequency, hematuria  Ext: no leg edema Neuro: no unilateral weakness, numbness, or tingling, no vision change or hearing loss Skin: Has sacral pressure ulcer MSK: No muscle spasm, no deformity, no limitation of range of movement in spin Heme: No easy  bruising.  Travel history: No recent long distant travel.  Allergy:  Allergies  Allergen Reactions  . Sulfonamide Derivatives Rash    Childhood reaction    Past Medical History:  Diagnosis Date  . Allergy    seasonal  . Bone cancer ([place]) 01/10/15 PET   lytic lesion lt 1st rib,T9,L3 Lt acetabulum  . Brain cancer ([place]) 01/13/15   MRI multiple small brain mets 10 individual lesions  . Bronchitis   . H/O reactive hypoglycemia   . IBS (irritable bowel syndrome)   . Lung cancer ([place])    stageIV nscca rul  . Osteopenia   . Paronychia 11/17/2015  . Pneumonia 2009 & 2011  . S/P radiation therapy completed 02/08/15   whole brain ,spine  . S/P radiation therapy completed 11/27/15   lung    Past Surgical History:  Procedure Laterality Date  . COLONOSCOPY  2009   Negative; [facility]  . ESOPHAGOGASTRODUODENOSCOPY (EGD) WITH PROPOFOL N/A 04/11/2015   Procedure: ESOPHAGOGASTRODUODENOSCOPY (EGD) WITH PROPOFOL;  Surgeon: Carol Ada, MD;  Location: [facility] ENDOSCOPY;  Service: Endoscopy;  Laterality: N/A;  . PEG PLACEMENT N/A 04/11/2015   Procedure: PERCUTANEOUS ENDOSCOPIC GASTROSTOMY (PEG) PLACEMENT;  Surgeon: Carol Ada, MD;  Location: [facility] ENDOSCOPY;  Service: Endoscopy;  Laterality: N/A;  . TONSILLECTOMY AND ADENOIDECTOMY    . WISDOM TOOTH EXTRACTION      Social History:  reports that she has never smoked. She has never used smokeless tobacco. She reports that she drinks alcohol. She reports that she does not use drugs.  Family History:  Family History  Problem Relation Age of Onset  . Transient ischemic attack Mother   .  Mental illness Mother     Dementia  . Diabetes Father   . Heart attack Father 63  . Heart attack Maternal Grandfather     in 56s  . Stomach cancer Maternal Aunt   . Heart attack Maternal Aunt     in 24s  . Diabetes Paternal Uncle     X 3     Prior to Admission medications   Medication Sig Start Date End Date Taking? Authorizing Provider  acetaminophen (TYLENOL)  325 MG tablet Take 650 mg by mouth every 6 (six) hours as needed for mild pain, moderate pain, fever or headache.    Yes Historical Provider, MD  Amino Acids-Protein Hydrolys (FEEDING SUPPLEMENT, PRO-STAT SUGAR FREE 64,) LIQD Take 30 mLs by mouth 2 (two) times daily. 05/16/16  Yes Hosie Poisson, MD  FeFum-FePoly-FA-B Cmp-C-Biot (INTEGRA PLUS) CAPS Take 1 capsule by mouth every morning. 05/24/16  Yes Curt Bears, MD  folic acid (FOLVITE) 1 MG tablet Take 1 tablet (1 mg total) by mouth daily. Patient taking differently: Take 1 mg by mouth every morning.  05/13/16  Yes Curt Bears, MD  HYDROcodone-acetaminophen (NORCO) 5-325 MG tablet Take 1-2 tablets by mouth every 4 (four) hours as needed for moderate pain. 02/14/16  Yes Hayden Pedro, PA-C  levETIRAcetam (KEPPRA) 500 MG tablet 1 tablet in the morning and 1.5 tablets in the evening Patient taking differently: Take 500-750 mg by mouth 2 (two) times daily. 1 tablet in the morning and 1.5 tablets in the evening 04/30/16  Yes Kathrynn Ducking, MD  Menthol, Topical Analgesic, (BIOFREEZE) 4 % GEL Apply 1 application topically 4 (four) times daily as needed (pain). Apply to trapezius    Yes Historical Provider, MD  Nutritional Supplements (FEEDING SUPPLEMENT, OSMOLITE 1.5 CAL,) LIQD Place 237 mLs into feeding tube 4 (four) times daily. Patient taking differently: Place 237 mLs into feeding tube 3 (three) times daily.  04/14/15  Yes Carol Ada, MD  OLANZapine (ZYPREXA) 10 MG tablet TAKE 1 TABLET (10 MG TOTAL) BY MOUTH AT BEDTIME. (INS PAYS 12/13/2015) 05/24/16  Yes Curt Bears, MD  polyethylene glycol (MIRALAX / GLYCOLAX) packet Take 17 g by mouth daily as needed. Patient taking differently: Take 17 g by mouth daily as needed for mild constipation or moderate constipation.  05/16/16  Yes Hosie Poisson, MD  senna-docusate (SENOKOT-S) 8.6-50 MG tablet Take 1 tablet by mouth 2 (two) times daily. 05/16/16  Yes Hosie Poisson, MD  traMADol (ULTRAM) 50  MG tablet Take 1 tablet (50 mg total) by mouth every 6 (six) hours as needed. Patient taking differently: Take 50 mg by mouth every 6 (six) hours as needed for moderate pain or severe pain.  02/19/16  Yes Curt Bears, MD  osimertinib mesylate (TAGRISSO) 80 MG tablet Take 1 tablet (80 mg total) by mouth daily. Patient not taking: Reported on 06/09/2016 05/29/16   Curt Bears, MD    Physical Exam: Vitals:   06/10/16 0249 06/10/16 0300 06/10/16 0329 06/10/16 0345  BP: 107/60  107/72 102/62  Pulse: (!) 106  (!) 104 (!) 102  Resp: (!) 38  (!) 39 (!) 36  Temp: 97.8 F (36.6 C)  98.7 F (37.1 C) 97.9 F (36.6 C)  TempSrc: Oral  Oral Oral  SpO2:  96%  95%  Weight:      Height:       General: Not in acute distress. Pale looking, very weak. HEENT:       Eyes: PERRL, EOMI, no scleral icterus.  ENT: No discharge from the ears and nose, no pharynx injection, no tonsillar enlargement.        Neck: No JVD, no bruit, no mass felt. Heme: No neck lymph node enlargement. Cardiac: S1/S2, RRR, No murmurs, No gallops or rubs. Respiratory: No rales, wheezing, rhonchi or rubs. GI: Soft, nondistended, nontender, no rebound pain, no organomegaly, BS present. GU: No hematuria Ext: No pitting leg edema bilaterally. 2+DP/PT pulse bilaterally. Musculoskeletal: No joint deformities, No joint redness or warmth, no limitation of ROM in spin. Skin: Has sacral pressure ulcer-stage II Neuro: Alert, oriented X3, cranial nerves II-XII grossly intact, moves all extremities normally. Marland Kitchen Psych: Patient is not psychotic, no suicidal or hemocidal ideation.  Labs on Admission: I have personally reviewed following labs and imaging studies  CBC:  Recent Labs Lab 06/09/16 2212  WBC 7.7  NEUTROABS 6.3  HGB 5.9*  HCT 19.0*  MCV 99.5  PLT 209   Basic Metabolic Panel:  Recent Labs Lab 06/09/16 2212  NA 139  K 4.0  CL 107  CO2 25  GLUCOSE 110*  BUN 30*  CREATININE 0.66  CALCIUM 8.3*    GFR: Estimated Creatinine Clearance: 69.4 mL/min (by C-G formula based on SCr of 0.66 mg/dL). Liver Function Tests:  Recent Labs Lab 06/09/16 2212  AST 94*  ALT 111*  ALKPHOS 73  BILITOT 0.4  PROT 6.7  ALBUMIN 2.5*   No results for input(s): LIPASE, AMYLASE in the last 168 hours. No results for input(s): AMMONIA in the last 168 hours. Coagulation Profile: No results for input(s): INR, PROTIME in the last 168 hours. Cardiac Enzymes: No results for input(s): CKTOTAL, CKMB, CKMBINDEX, TROPONINI in the last 168 hours. BNP (last 3 results) No results for input(s): PROBNP in the last 8760 hours. HbA1C: No results for input(s): HGBA1C in the last 72 hours. CBG: No results for input(s): GLUCAP in the last 168 hours. Lipid Profile: No results for input(s): CHOL, HDL, LDLCALC, TRIG, CHOLHDL, LDLDIRECT in the last 72 hours. Thyroid Function Tests: No results for input(s): TSH, T4TOTAL, FREET4, T3FREE, THYROIDAB in the last 72 hours. Anemia Panel: No results for input(s): VITAMINB12, FOLATE, FERRITIN, TIBC, IRON, RETICCTPCT in the last 72 hours. Urine analysis:    Component Value Date/Time   COLORURINE YELLOW 06/09/2016 2335   APPEARANCEUR CLEAR 06/09/2016 2335   LABSPEC 1.020 06/09/2016 2335   PHURINE 7.0 06/09/2016 2335   GLUCOSEU NEGATIVE 06/09/2016 2335   HGBUR TRACE (A) 06/09/2016 2335   BILIRUBINUR NEGATIVE 06/09/2016 2335   KETONESUR NEGATIVE 06/09/2016 2335   PROTEINUR NEGATIVE 06/09/2016 2335   UROBILINOGEN 1.0 02/11/2015 0748   NITRITE NEGATIVE 06/09/2016 2335   LEUKOCYTESUR NEGATIVE 06/09/2016 2335   Sepsis Labs: '@LABRCNTIP'$ (procalcitonin:4,lacticidven:4) )No results found for this or any previous visit (from the past 240 hour(s)).   Radiological Exams on Admission: Dg Chest 2 View  Result Date: 06/09/2016 CLINICAL DATA:  Weakness for 3 days. History of lung, bone and brain cancer EXAM: CHEST  2 VIEW COMPARISON:  05/14/2016 CXR, 05/11/2016 chest CT FINDINGS:  Slight increase in moderate right effusion spanning at least 1.5 vertebral body heights. Apical mass in the right upper lobe is unchanged. No overt pulmonary edema. The heart is top-normal in size. No aortic aneurysm. Known osteoblastic appearing lesions of T9, L2 and L3 remain stable. IMPRESSION: Slight increase in right pleural effusion since the interim. Stable right apical masslike opacity. Osteoblastic disease involving T9, L2 and L3 remain grossly unchanged. Electronically Signed   By: Meredith Leeds.D.  On: 06/09/2016 22:41     EKG: Not done in ED, will get one.   Assessment/Plan Principal Problem:   Symptomatic anemia Active Problems:   Metastasis to bone [facility])   Malignant neoplasm of upper lobe of right lung (HCC)   Brain metastases (HCC)   Weakness   Protein-calorie malnutrition, severe   Sacral ulcer (HCC)   Symptomatic anemia: hemoglobin dropped from 8.2 on 05/29/16-->5.9 today. Possibly due to recent chemotherapy. Patient had 2 cycles for chemotherapy 10 weeks ago, which was stopped due to intolerance. Patient does not have hematemesis, hematuria or rectal bleeding.  -will admit to tele bed as inpt -will transfuse 2 U of blood -check FOBT and anemia panel -IVF: received 1L NS.   Protein-calorie malnutrition, severe: -Continue nutrition supplement  Metastasized to lung cancer: Patient had 2 cycles for chemotherapy 10 weeks ago, which was stopped due to intolerance. Plan is to start [place]. -will f/u with oncologist  Generalized weakness: Due to multifactorial, including severe anemia, mulnutration and deconditioning -pt/oT -Treat underlying issues as above  Sacral ulcer-stage II: -Consult to wound care   DVT ppx: SCD Code Status: DNR Family Communication: Yes, patient's brother at bed side Disposition Plan:  Anticipate discharge back to previous home environment Consults called:  none Admission status: Inpatient/tele   Date of Service 06/10/2016    Ivor Costa [facility] Pager [phone]  If 7PM-7AM, please contact night-coverage [email or web address] Password [facility] 06/10/2016, 5:44 AM

## 2016-06-10 NOTE — Progress Notes (Signed)
Initial Nutrition Assessment  DOCUMENTATION CODES:   Severe malnutrition in context of chronic illness  INTERVENTION:  Continue home tube feed regimen of Osmolite 1.5, 3 cans daily via PEG tube.  Continue Pro-Stat po 30 ml BID with meals, each supplement provides 100 kcal and 15 grams protein.  This regimen provides 1265 kcal (70% of needs), 75 grams protein (83% of needs) and 543 ml H2O. Additional PO intake to meet needs.  Discontinued Ensure Enlive per patient request.   NUTRITION DIAGNOSIS:   Increased nutrient needs related to wound healing, catabolic illness, cancer and cancer related treatments as evidenced by estimated needs.  GOAL:   Patient will meet greater than or equal to 90% of their needs  MONITOR:   PO intake, Supplement acceptance, Skin, Weight trends, I & O's  REASON FOR ASSESSMENT:   Malnutrition Screening Tool    ASSESSMENT:   65 y.o. 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   Spoke with patient at bedside. She reports she still does Osmolite 1.5 3 cans daily via PEG tube. She was unsure if she was taking Pro-Stat BID at home with meals. Does report intake of 2 meals daily at home, but unable to provide details on intake. She reports giving herself "one syringe full" of free water BID via PEG.   Patient does not want to drink Ensure and would prefer to stick with her usual regimen of TF + PO intake of meals.  Patient reports UBW about 130 lbs and that she has been stable.   Medications reviewed and include: Trinsicon/Foltrin 1 capsule daily, senna, NS @ 100 ml/hr.  Labs reviewed: BUN 22, Glucose 110.   Nutrition-Focused physical exam completed. Findings are severe fat depletion, severe muscle depletion, and no edema.   Diet Order:  Diet regular Room service appropriate? Yes; Fluid consistency: Thin  Skin:  Wound (see  comment) (Stage II to sacrum)  Last BM:  06/08/2016  Height:   Ht Readings from Last 1 Encounters:  06/09/16 '5\' 7"'$  (1.702 m)    Weight:   Wt Readings from Last 1 Encounters:  06/09/16 133 lb (60.3 kg)    Ideal Body Weight:  61.36 kg  BMI:  Body mass index is 20.83 kg/m.  Estimated Nutritional Needs:   Kcal:  1800-2000  Protein:  90-100 grams  Fluid:  2 L/day  EDUCATION NEEDS:   No education needs identified at this time  Willey Blade, MS, RD, LDN Pager: [phone] After Hours Pager: [phone]

## 2016-06-10 NOTE — Care Management Note (Signed)
Case Management Note  Patient Details  Name: Laura Walter MRN: [id number] Date of Birth: 09-26-1950  Subjective/Objective:   PT-recc SNF/supv/24hr care. Patient states she has her spouse @ home, [facility] SNF.Has rw.Provided patient w/HHC agency list-await choice, & HHC orders,f23f CSW notified of patient declining SNF.                Action/Plan:d/c plan home w/HHC.   Expected Discharge Date:                 Expected Discharge Plan:  [place] [facility] Referral:  Clinical Social Work  Discharge planning Services  CM Consult  Post Acute Care Choice:  Durable Medical Equipment (rw) Choice offered to:  Patient  DME Arranged:    DME Agency:     HH Arranged:    [facility]:     Status of Service:  In process, will continue to follow  If discussed at Long Length of Stay Meetings, dates discussed:    Additional Comments:  MDessa Phi RN 06/10/2016, 3:44 PM

## 2016-06-10 NOTE — ED Notes (Signed)
Attempted to call report.  Informed receiving RN will call back.

## 2016-06-10 NOTE — Evaluation (Signed)
Physical Therapy Evaluation Patient Details Name: Laura Walter MRN: [id number] DOB: 04/04/1951 Today's Date: 06/10/2016   History of Present Illness  65 y.o. female admitted with anemia (Hgb 5.9), sacral ulcer. 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   Clinical Impression  Pt admitted with above diagnosis. Pt currently with functional limitations due to the deficits listed below (see PT Problem List). Pt ambulated 8' with RW and min/guard assist, mod A for sit to stand. Will need 24* care at present.  Pt will benefit from skilled PT to increase their independence and safety with mobility to allow discharge to the venue listed below.       Follow Up Recommendations SNF;Supervision/Assistance - 24 hour;Supervision for mobility/OOB (SNF (if caregiver cannot provide 24* assist) vs HHPT )    Equipment Recommendations  None recommended by PT    Recommendations for Other Services       Precautions / Restrictions Precautions Precautions: Fall Precaution Comments: pt denies h/o falls Restrictions Weight Bearing Restrictions: No      Mobility  Bed Mobility               General bed mobility comments: NT - up in chair  Transfers Overall transfer level: Needs assistance Equipment used: Rolling walker (2 wheeled) Transfers: Sit to/from Stand Sit to Stand: Mod assist         General transfer comment: mod A to rise and steady, VCs hand placement  Ambulation/Gait Ambulation/Gait assistance: Min guard Ambulation Distance (Feet): 8 Feet Assistive device: Rolling walker (2 wheeled) Gait Pattern/deviations: Step-to pattern;Trunk flexed   Gait velocity interpretation: Below normal speed for age/gender General Gait Details: VCs for safety, min/guard for balance, distance limited by fatigue  Stairs            Wheelchair Mobility    Modified Rankin (Stroke Patients Only)       Balance Overall balance assessment: Needs assistance   Sitting  balance-Leahy Scale: Good       Standing balance-Leahy Scale: Poor                               Pertinent Vitals/Pain Pain Assessment: No/denies pain    Home Living Family/patient expects to be discharged to:: Private residence Living Arrangements: Non-relatives/Friends Available Help at Discharge: Friend(s);Available 24 hours/day Type of Home: House Home Access: Stairs to enter Entrance Stairs-Rails: Right;Can reach both;Left Entrance Stairs-Number of Steps: 2 Home Layout: One level Home Equipment: Walker - 2 wheels;Bedside commode;Hospital bed      Prior Function Level of Independence: Needs assistance   Gait / Transfers Assistance Needed: Pt utilizing RW to ambulate short distances but requiring increasing levels of assist  ADL's / Homemaking Assistance Needed: assist from roomate        Hand Dominance        Extremity/Trunk Assessment   Upper Extremity Assessment: Defer to OT evaluation           Lower Extremity Assessment: Overall WFL for tasks assessed      Cervical / Trunk Assessment: Kyphotic  Communication   Communication: No difficulties  Cognition Arousal/Alertness: Awake/alert Behavior During Therapy: WFL for tasks assessed/performed Overall Cognitive Status: Within Functional Limits for tasks assessed                      General Comments      Exercises     Assessment/Plan  PT Assessment Patient needs continued PT services  PT Problem List Decreased activity tolerance;Decreased balance;Decreased mobility          PT Treatment Interventions Gait training;Functional mobility training;Balance training;Therapeutic exercise;Therapeutic activities;Patient/family education    PT Goals (Current goals can be found in the Care Plan section)  Acute Rehab PT Goals Patient Stated Goal: to walk, be able to stand PT Goal Formulation: With patient Time For Goal Achievement: 06/24/16 Potential to Achieve Goals: Fair     Frequency Min 3X/week   Barriers to discharge        Co-evaluation               End of Session Equipment Utilized During Treatment: Gait belt Activity Tolerance: Patient tolerated treatment well Patient left: in chair;with call bell/phone within reach;with chair alarm set Nurse Communication: Mobility status         Time: 1212-1230 PT Time Calculation (min) (ACUTE ONLY): 18 min   Charges:   PT Evaluation $PT Eval Low Complexity: 1 Procedure     PT G Codes:        Philomena Doheny 06/10/2016, 12:54 PM [phone]

## 2016-06-11 ENCOUNTER — Inpatient Hospital Stay (HOSPITAL_COMMUNITY): Payer: BC Managed Care – PPO

## 2016-06-11 ENCOUNTER — Other Ambulatory Visit: Payer: Self-pay | Admitting: Medical Oncology

## 2016-06-11 DIAGNOSIS — E43 Unspecified severe protein-calorie malnutrition: Secondary | ICD-10-CM

## 2016-06-11 DIAGNOSIS — C3411 Malignant neoplasm of upper lobe, right bronchus or lung: Secondary | ICD-10-CM

## 2016-06-11 DIAGNOSIS — C7951 Secondary malignant neoplasm of bone: Secondary | ICD-10-CM

## 2016-06-11 DIAGNOSIS — R740 Nonspecific elevation of levels of transaminase and lactic acid dehydrogenase [LDH]: Secondary | ICD-10-CM

## 2016-06-11 DIAGNOSIS — C7931 Secondary malignant neoplasm of brain: Secondary | ICD-10-CM

## 2016-06-11 DIAGNOSIS — Z5111 Encounter for antineoplastic chemotherapy: Secondary | ICD-10-CM

## 2016-06-11 LAB — TYPE AND SCREEN
ABO/RH(D): B NEG
Antibody Screen: NEGATIVE
Unit division: 0
Unit division: 0

## 2016-06-11 LAB — HEPATIC FUNCTION PANEL
ALK PHOS: 73 U/L (ref 38–126)
ALT: 86 U/L — ABNORMAL HIGH (ref 14–54)
AST: 63 U/L — ABNORMAL HIGH (ref 15–41)
Albumin: 2 g/dL — ABNORMAL LOW (ref 3.5–5.0)
BILIRUBIN INDIRECT: 0.4 mg/dL (ref 0.3–0.9)
BILIRUBIN TOTAL: 0.5 mg/dL (ref 0.3–1.2)
Bilirubin, Direct: 0.1 mg/dL (ref 0.1–0.5)
TOTAL PROTEIN: 6 g/dL — AB (ref 6.5–8.1)

## 2016-06-11 LAB — CBC
HCT: 29.6 % — ABNORMAL LOW (ref 36.0–46.0)
HCT: 27.1 % — ABNORMAL LOW (ref 36.0–46.0)
HEMOGLOBIN: 9.6 g/dL — AB (ref 12.0–15.0)
HEMOGLOBIN: 8.9 g/dL — AB (ref 12.0–15.0)
MCH: 29.8 pg (ref 26.0–34.0)
MCH: 30 pg (ref 26.0–34.0)
MCHC: 32.4 g/dL (ref 30.0–36.0)
MCHC: 32.8 g/dL (ref 30.0–36.0)
MCV: 91.9 fL (ref 78.0–100.0)
MCV: 91.2 fL (ref 78.0–100.0)
PLATELETS: 159 10*3/uL (ref 150–400)
Platelets: 144 10*3/uL — ABNORMAL LOW (ref 150–400)
RBC: 3.22 MIL/uL — AB (ref 3.87–5.11)
RBC: 2.97 MIL/uL — ABNORMAL LOW (ref 3.87–5.11)
RDW: 19.6 % — ABNORMAL HIGH (ref 11.5–15.5)
RDW: 19.1 % — AB (ref 11.5–15.5)
WBC: 8.5 10*3/uL (ref 4.0–10.5)
WBC: 6.7 10*3/uL (ref 4.0–10.5)

## 2016-06-11 MED ORDER — GADOBENATE DIMEGLUMINE 529 MG/ML IV SOLN: 15.0000 mL | Freq: Once | INTRAVENOUS | Status: DC | PRN

## 2016-06-11 MED ORDER — INTEGRA PLUS PO CAPS: 1.0000 | ORAL_CAPSULE | Freq: Every morning | ORAL | 0 refills | Status: DC

## 2016-06-11 NOTE — Evaluation (Addendum)
Occupational Therapy Evaluation Patient Details Name: LAQUETA BONAVENTURA MRN: [id number] DOB: 02-25-1951 Today's Date: 06/11/2016    History of Present Illness 65 y.o. female admitted with anemia (Hgb 5.9), sacral ulcer. 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    Clinical Impression   Pt was admitted for the above.  At baseline, she participates in and has help with ADLs--unable to verify amount of assistance. Pt will benefit from continued Ot to increase safety and independence with adls to maximize her participation and decrease burden of care.  Goals focus on SPT to 3:1, and sit to stand for adls with min A as well as UB adls with set up    Follow Up Recommendations  Home health OT;Supervision/Assistance - 24 hour (pt is refusing SNF per notes)    Equipment Recommendations  None recommended by OT (pt has a 3:1 commode)    Recommendations for Other Services       Precautions / Restrictions Precautions Precautions: Fall Precaution Comments: pt denies h/o falls Restrictions Weight Bearing Restrictions: No      Mobility Bed Mobility Overal bed mobility: Needs Assistance Bed Mobility: Supine to Sit;Sit to Supine     Supine to sit: Mod assist Sit to supine: Mod assist   General bed mobility comments: assist for legs and trunk; needed +2 to reposition up and over in the bed.  Pt able to shift hips slightly, but fatiques quickly.  Transfers   Equipment used: Rolling walker (2 wheeled)   Sit to Stand: Mod assist         General transfer comment: assist to rise and stabilize. Cues for UE placement    Balance                                            ADL Overall ADL's : Needs assistance/impaired     Grooming: Minimal assistance;Sitting   Upper Body Bathing: Minimal assitance;Sitting   Lower Body Bathing: Maximal assistance;Sit to/from stand   Upper Body Dressing : Minimal assistance;Sitting   Lower Body Dressing: Total  assistance;Sit to/from stand       Toileting- Water quality scientist and Hygiene: Maximal assistance;Sit to/from stand         General ADL Comments: pt stated that she thought she could walk to bathroom; as soon as she stood she became incontinent x urine. Assisted her with cleaning up and changing clothing.  Pt needs assist at home for ADLs--not sure how much assistance.       Vision     Perception     Praxis      Pertinent Vitals/Pain Pain Assessment: No/denies pain     Hand Dominance Right   Extremity/Trunk Assessment Upper Extremity Assessment Upper Extremity Assessment: Generalized weakness (bil UEs tight end ranges 90 LUE and 100 RUE)       Cervical / Trunk Assessment Cervical / Trunk Assessment: Kyphotic   Communication Communication Communication: No difficulties   Cognition Arousal/Alertness: Awake/alert Behavior During Therapy: WFL for tasks assessed/performed Overall Cognitive Status: Within Functional Limits for tasks assessed                     General Comments       Exercises       Shoulder Instructions      Home Living Family/patient expects to be discharged to:: Private residence  Living Arrangements: Non-relatives/Friends Available Help at Discharge: Friend(s);Available 24 hours/day Type of Home: House             Bathroom Shower/Tub: Walk-in shower         Home Equipment: Environmental consultant - 2 wheels;Bedside commode;Hospital bed          Prior Functioning/Environment Level of Independence: Needs assistance  Gait / Transfers Assistance Needed: Pt utilizing RW to ambulate short distances but requiring increasing levels of assist ADL's / Homemaking Assistance Needed: assist from roomate            OT Problem List: Decreased strength;Decreased activity tolerance;Impaired balance (sitting and/or standing);Decreased range of motion   OT Treatment/Interventions: Self-care/ADL training;DME and/or AE instruction;Patient/family  education;Balance training;Therapeutic activities;Energy conservation    OT Goals(Current goals can be found in the care plan section) Acute Rehab OT Goals Patient Stated Goal: to walk, be able to stand OT Goal Formulation: With patient Time For Goal Achievement: 06/18/16 Potential to Achieve Goals: Good ADL Goals Pt Will Transfer to Toilet: with min assist;stand pivot transfer;bedside commode Additional ADL Goal #1: pt will go from sit to stand with min A and maintain with min guard for 2 minutes for adls Additional ADL Goal #2: pt will perform UB adls and grooming with set up, sitting with 3 rest breaks   OT Frequency: Min 2X/week   Barriers to D/C:            Co-evaluation              End of Session    Activity Tolerance: Patient limited by fatigue Patient left: in bed;with call bell/phone within reach;with bed alarm set   Time: 1449-1518 OT Time Calculation (min): 29 min Charges:  OT General Charges $OT Visit: 1 Procedure OT Evaluation $OT Eval Low Complexity: 1 Procedure OT Treatments $Self Care/Home Management : 8-22 mins G-Codes:    Royden Bulman 2016/06/26, 3:34 PM Lesle Chris, OTR/L [phone] 2016-06-26

## 2016-06-11 NOTE — Care Management Note (Signed)
Case Management Note  Patient Details  Name: Laura Walter MRN: [id number] Date of Birth: 20-Apr-1951  Subjective/Objective:                  Symptomatic anemia Action/Plan: Discharge planning Expected Discharge Date:  06/11/16               Expected Discharge Plan:  [place]  In-House Referral:  Clinical Social Work  Discharge planning Services  CM Consult  Post Acute Care Choice:  Durable Medical Equipment (rw) Choice offered to:  Patient  DME Arranged:    DME Agency:     HH Arranged:  RN, PT, OT, Nurse's Aide [place] Agency:  [facility]  Status of Service:  Completed, signed off  If discussed at [place] of Stay Meetings, dates discussed:    Additional Comments: CM called [place], Hoyle Sauer to notify of discharge and Hoyle Sauer has requested HHPT/OT/RN/Aide orders and face to face be placed.  CM notified MD to please place requested orders and F2F.  No other CM needs were communicated. Dellie Catholic, RN 06/11/2016, 2:47 PM

## 2016-06-11 NOTE — Progress Notes (Signed)
Oral Chemotherapy Pharmacist Encounter  Letter of medical necessity and prescriber attestation faxed to [employer] on 06/10/16.  Oral Chemo Clinic will continue to follow.  Johny Drilling, PharmD, BCPS 06/11/2016  9:37 AM Oral Chemotherapy Clinic [phone]

## 2016-06-11 NOTE — Discharge Summary (Addendum)
Physician Discharge Summary  Laura Walter BTD:[id number] DOB: 23-Aug-1950 DOA: 06/09/2016  PCP: [name], MD  Admit date: 06/09/2016 Discharge date: 06/11/2016  Admitted From: Home Disposition:  Home with home health  Recommendations for Outpatient Follow-up:  1. Follow up with oncology as scheduled. Please check LFTs during outpatient follow-up please follow-up with MRI brain results done prior to discharge.   Home Health: PT Equipment/Devices: None  Discharge Condition: Guarded CODE STATUS: DO NOT RESUSCITATE Diet recommendation: Tube feeds with supplements     Discharge Diagnoses:  Principal Problem:   Symptomatic anemia  Active Problems:   Metastasis to bone (HCC)   Malignant neoplasm of upper lobe of right lung (HCC)   Brain metastases (HCC)   Weakness   Protein-calorie malnutrition, severe   Sacral ulcer (HCC)  Brief narrative/history of present illness 65 year old female with stage IV lung cancer metastasized brain and bone, IBS status post PEG tube placement presented with generalized weakness and fever of 100.64F at home. Patient denies any chills, chest pain, shortness of breath, abdominal pain, nausea, vomiting, diarrhea or dysuria. Has chronic cough. In the ED she was found to have hemoglobin of 5.9 (drop from 8.2 at baseline), stable renal function and electrolytes. Vitals were stable. Chest x-ray showed chronic right pleural effusion. Patient admitted for transfusion and further management.  Hospital course Principal Problem:   Symptomatic anemia Possibly due to underlying malignancy. Hemoglobin improved appropriately after 2 units blood transfusion (8.4 today). Iron panel suggests anemia of chronic disease. Normal TSH. Low normal B12. Patient is already on iron and B12 supplement.  Active Problems:   Malignant neoplasm of upper lobe of right lung ([place]) With brain and bony metastases. Getting radiation therapy with Dr. Lisbeth Renshaw. Continue pain control with  when necessary Vicodin. Keppra for seizure prophylaxis. Patient scheduled for MRI of the brain with contrast on 11/9 and requested to get it done while she was here. I will order for MRI prior to discharge and can be followed as outpatient. Discussed plan with Dr. Julien Nordmann on the phone.  Generalized weakness Secondary to underlying malignancy and symptomatic anemia. PT recommends SNF versus 24 our supervision at home. Patient and her partner decided to go home with home health.    Protein-calorie malnutrition, severe Continue Tube feeds and pro-stat supplement.  Unstageable sacral pressure injury ([place]) Appreciate wound care evaluation.  Transaminitis Mild. No clear etiology and no liver metastases on prior imaging. Follow-up as outpatient.   Family Communication  : Partner at bedside  Disposition Plan  : Home with home health   Consults  :  None  Procedures  : MRI brain   Discharge Instructions     Medication List    TAKE these medications   acetaminophen 325 MG tablet Commonly known as:  TYLENOL Take 650 mg by mouth every 6 (six) hours as needed for mild pain, moderate pain, fever or headache.   BIOFREEZE 4 % Gel Generic drug:  Menthol (Topical Analgesic) Apply 1 application topically 4 (four) times daily as needed (pain). Apply to trapezius   feeding supplement (OSMOLITE 1.5 CAL) Liqd Place 237 mLs into feeding tube 4 (four) times daily. What changed:  when to take this   feeding supplement (PRO-STAT SUGAR FREE 64) Liqd Take 30 mLs by mouth 2 (two) times daily.   HYDROcodone-acetaminophen 5-325 MG tablet Commonly known as:  NORCO Take 1-2 tablets by mouth every 4 (four) hours as needed for moderate pain.   INTEGRA PLUS Caps Take 1 capsule by mouth every  morning.   levETIRAcetam 500 MG tablet Commonly known as:  KEPPRA 1 tablet in the morning and 1.5 tablets in the evening What changed:  how much to take  how to take this  when to take  this  additional instructions   OLANZapine 10 MG tablet Commonly known as:  ZYPREXA TAKE 1 TABLET (10 MG TOTAL) BY MOUTH AT BEDTIME. (INS PAYS 12/13/2015)   osimertinib mesylate 80 MG tablet Commonly known as:  TAGRISSO Take 1 tablet (80 mg total) by mouth daily.   polyethylene glycol packet Commonly known as:  MIRALAX / GLYCOLAX Take 17 g by mouth daily as needed. What changed:  reasons to take this   senna-docusate 8.6-50 MG tablet Commonly known as:  Senokot-S Take 1 tablet by mouth 2 (two) times daily.   traMADol 50 MG tablet Commonly known as:  ULTRAM Take 1 tablet (50 mg total) by mouth every 6 (six) hours as needed. What changed:  reasons to take this      Follow-up Information    Eilleen Kempf., MD Follow up.   Specialty:  Oncology Why:  as scheduled Contact information: [address] [place] [address] [phone]          Allergies  Allergen Reactions  . Sulfonamide Derivatives Rash    Childhood reaction     Procedures/Studies: Dg Chest 2 View  Result Date: 06/09/2016 CLINICAL DATA:  Weakness for 3 days. History of lung, bone and brain cancer EXAM: CHEST  2 VIEW COMPARISON:  05/14/2016 CXR, 05/11/2016 chest CT FINDINGS: Slight increase in moderate right effusion spanning at least 1.5 vertebral body heights. Apical mass in the right upper lobe is unchanged. No overt pulmonary edema. The heart is top-normal in size. No aortic aneurysm. Known osteoblastic appearing lesions of T9, L2 and L3 remain stable. IMPRESSION: Slight increase in right pleural effusion since the interim. Stable right apical masslike opacity. Osteoblastic disease involving T9, L2 and L3 remain grossly unchanged. Electronically Signed   By: Ashley Royalty M.D.   On: 06/09/2016 22:41   Dg Abd Acute W/chest  Result Date: 05/14/2016 CLINICAL DATA:  Weakness and emesis.  Pneumonia.  Lung cancer. EXAM: DG ABDOMEN ACUTE W/ 1V CHEST COMPARISON:  05/11/2016 and CT scan from  05/11/2016. FINDINGS: Right apical lung mass. Retrocardiac density on the right similar to prior. Volume loss on the right. Heart size within normal limits. T9 compression fracture. Left-side-down decubitus view the abdomen demonstrates no free intraperitoneal gas. There is contrast medium within the colon. No significant abnormal air-fluid levels are identified. There is contrast medium in the distal small bowel as well. Contrast is present in the rectum. Left bony pelvic deformities from old healed fractures. Percutaneous gastrostomy tube noted. Mild levoconvex rotary scoliosis of the lumbar spine. Sclerosis at the L3 vertebral level compatible with known osseous metastatic disease in this vicinity. IMPRESSION: 1. Right apical lung mass with stable right retrocardiac density. 2. No appreciable change in the osseous metastatic disease. 3. No free air identified. Contrast medium in the bowel. No dilated bowel noted. Electronically Signed   By: Van Clines M.D.   On: 05/14/2016 15:32       Subjective: Still feels weak.  Discharge Exam: Vitals:   06/10/16 2025 06/11/16 0441  BP: 113/70 134/87  Pulse: 98 (!) 109  Resp: 17 18  Temp: 98.5 F (36.9 C) 98.2 F (36.8 C)   Vitals:   06/10/16 1244 06/10/16 1455 06/10/16 2025 06/11/16 0441  BP:  114/74 113/70 134/87  Pulse: (!) 120 (!) 101 98 (!) 109  Resp:  '16 17 18  '$ Temp:  98.6 F (37 C) 98.5 F (36.9 C) 98.2 F (36.8 C)  TempSrc:  Oral Oral Oral  SpO2:  98% 96% 96%  Weight:      Height:       Gen: Appears weak HEENT: Pallor present, moist mucosa, supple neck Chest: clear b/l, no added sounds CVS: N S1&S2, no murmurs,  GI: soft, NT, ND,  PEG tube+ Musculoskeletal: warm, no edema CNS: Alert and oriented   The results of significant diagnostics from this hospitalization (including imaging, microbiology, ancillary and laboratory) are listed below for reference.     Microbiology: Recent Results (from the past 240 hour(s))   Urine culture     Status: Abnormal (Preliminary result)   Collection Time: 06/09/16 11:35 PM  Result Value Ref Range Status   Specimen Description URINE, RANDOM  Final   Special Requests NONE  Final   Culture 40,000 COLONIES/mL STAPHYLOCOCCUS AUREUS (A)  Final   Report Status PENDING  Incomplete     Labs: BNP (last 3 results) No results for input(s): BNP in the last 8760 hours. Basic Metabolic Panel:  Recent Labs Lab 06/09/16 2212 06/10/16 1342  NA 139 137  K 4.0 3.8  CL 107 107  CO2 25 25  GLUCOSE 110* 110*  BUN 30* 22*  CREATININE 0.66 0.78  CALCIUM 8.3* 7.9*   Liver Function Tests:  Recent Labs Lab 06/09/16 2212 06/11/16 0511  AST 94* 63*  ALT 111* 86*  ALKPHOS 73 73  BILITOT 0.4 0.5  PROT 6.7 6.0*  ALBUMIN 2.5* 2.0*   No results for input(s): LIPASE, AMYLASE in the last 168 hours. No results for input(s): AMMONIA in the last 168 hours. CBC:  Recent Labs Lab 06/09/16 2212 06/10/16 1342 06/11/16 0511  WBC 7.7 8.5 6.7  NEUTROABS 6.3  --   --   HGB 5.9* 9.6* 8.9*  HCT 19.0* 29.6* 27.1*  MCV 99.5 91.9 91.2  PLT 185 159 144*   Cardiac Enzymes: No results for input(s): CKTOTAL, CKMB, CKMBINDEX, TROPONINI in the last 168 hours. BNP: Invalid input(s): POCBNP CBG: No results for input(s): GLUCAP in the last 168 hours. D-Dimer No results for input(s): DDIMER in the last 72 hours. Hgb A1c No results for input(s): HGBA1C in the last 72 hours. Lipid Profile No results for input(s): CHOL, HDL, LDLCALC, TRIG, CHOLHDL, LDLDIRECT in the last 72 hours. Thyroid function studies No results for input(s): TSH, T4TOTAL, T3FREE, THYROIDAB in the last 72 hours.  Invalid input(s): FREET3 Anemia work up  Recent Labs  06/10/16 1342  VITAMINB12 330  FOLATE 79.2  FERRITIN 2,655*  TIBC 146*  IRON 44  RETICCTPCT 2.0   Urinalysis    Component Value Date/Time   COLORURINE YELLOW 06/09/2016 2335   APPEARANCEUR CLEAR 06/09/2016 2335   LABSPEC 1.020 06/09/2016  2335   PHURINE 7.0 06/09/2016 2335   GLUCOSEU NEGATIVE 06/09/2016 2335   HGBUR TRACE (A) 06/09/2016 2335   BILIRUBINUR NEGATIVE 06/09/2016 2335   KETONESUR NEGATIVE 06/09/2016 2335   PROTEINUR NEGATIVE 06/09/2016 2335   UROBILINOGEN 1.0 02/11/2015 0748   NITRITE NEGATIVE 06/09/2016 2335   LEUKOCYTESUR NEGATIVE 06/09/2016 2335   Sepsis Labs Invalid input(s): PROCALCITONIN,  WBC,  LACTICIDVEN Microbiology Recent Results (from the past 240 hour(s))  Urine culture     Status: Abnormal (Preliminary result)   Collection Time: 06/09/16 11:35 PM  Result Value Ref Range Status   Specimen Description URINE, RANDOM  Final   Special Requests NONE  Final   Culture 40,000 COLONIES/mL STAPHYLOCOCCUS AUREUS (A)  Final   Report Status PENDING  Incomplete     Time coordinating discharge: < 30 minutes  SIGNED:   Louellen Molder, MD  [facility] 06/11/2016, 1:56 PM Pager   If 7PM-7AM, please contact night-coverage [email or web address] Password [email or web address]

## 2016-06-11 NOTE — Discharge Instructions (Signed)
Anemia, Nonspecific Anemia is a condition in which the concentration of red blood cells or hemoglobin in the blood is below normal. Hemoglobin is a substance in red blood cells that carries oxygen to the tissues of the body. Anemia results in not enough oxygen reaching these tissues.  CAUSES  Common causes of anemia include:   Excessive bleeding. Bleeding may be internal or external. This includes excessive bleeding from periods (in women) or from the intestine.   Poor nutrition.   Chronic kidney, thyroid, and liver disease.  Bone marrow disorders that decrease red blood cell production.  Cancer and treatments for cancer.  HIV, AIDS, and their treatments.  Spleen problems that increase red blood cell destruction.  Blood disorders.  Excess destruction of red blood cells due to infection, medicines, and autoimmune disorders. SIGNS AND SYMPTOMS   Minor weakness.   Dizziness.   Headache.  Palpitations.   Shortness of breath, especially with exercise.   Paleness.  Cold sensitivity.  Indigestion.  Nausea.  Difficulty sleeping.  Difficulty concentrating. Symptoms may occur suddenly or they may develop slowly.  DIAGNOSIS  Additional blood tests are often needed. These help your health care provider determine the best treatment. Your health care provider will check your stool for blood and look for other causes of blood loss.  TREATMENT  Treatment varies depending on the cause of the anemia. Treatment can include:   Supplements of iron, vitamin B12, or folic acid.   Hormone medicines.   A blood transfusion. This may be needed if blood loss is severe.   Hospitalization. This may be needed if there is significant continual blood loss.   Dietary changes.  Spleen removal. HOME CARE INSTRUCTIONS Keep all follow-up appointments. It often takes many weeks to correct anemia, and having your health care provider check on your condition and your response to  treatment is very important. SEEK IMMEDIATE MEDICAL CARE IF:   You develop extreme weakness, shortness of breath, or chest pain.   You become dizzy or have trouble concentrating.  You develop heavy vaginal bleeding.   You develop a rash.   You have bloody or black, tarry stools.   You faint.   You vomit up blood.   You vomit repeatedly.   You have abdominal pain.  You have a fever or persistent symptoms for more than 2-3 days.   You have a fever and your symptoms suddenly get worse.   You are dehydrated.  MAKE SURE YOU:  Understand these instructions.  Will watch your condition.  Will get help right away if you are not doing well or get worse.   This information is not intended to replace advice given to you by your health care provider. Make sure you discuss any questions you have with your health care provider.   Document Released: 08/29/2004 Document Revised: 03/24/2013 Document Reviewed: 01/15/2013 Elsevier Interactive Patient Education 2016 [employer]  

## 2016-06-12 ENCOUNTER — Ambulatory Visit: Payer: BC Managed Care – PPO | Admitting: Internal Medicine

## 2016-06-12 ENCOUNTER — Ambulatory Visit: Payer: BC Managed Care – PPO

## 2016-06-12 ENCOUNTER — Other Ambulatory Visit: Payer: Self-pay | Admitting: General Practice

## 2016-06-12 ENCOUNTER — Other Ambulatory Visit: Payer: BC Managed Care – PPO

## 2016-06-12 LAB — URINE CULTURE

## 2016-06-12 MED ORDER — DOXYCYCLINE HYCLATE 100 MG PO TABS: 100.0000 mg | ORAL_TABLET | Freq: Two times a day (BID) | ORAL | 0 refills | Status: DC

## 2016-06-12 NOTE — Progress Notes (Signed)
Called pt and lmovm to return call.

## 2016-06-12 NOTE — Progress Notes (Signed)
CM received call from [facility] rep, Hoyle Sauer as agency could not pull orders or face to face and requested I fax to [phone].  CM faxed orders, face to face and facesheet.  No other CM needs were communicated.

## 2016-06-12 NOTE — Progress Notes (Signed)
Pt discharged to home. Left unit in wheelchair pushed by nurse tech accompanied by female family member. Discharge instructions signed by female family member.  No concerns voiced. Left unit in good condition.  VWilliams,rn.

## 2016-06-13 ENCOUNTER — Ambulatory Visit (HOSPITAL_COMMUNITY): Payer: BC Managed Care – PPO | Attending: Radiation Oncology

## 2016-06-13 NOTE — Progress Notes (Signed)
Oral Chemotherapy Pharmacist Encounter  I called [employer] to follow-up on patient's application. I was informed that patient has been APPROVED for patient assistance for Tagrisso through [employer]. They will reach out to the patient to schedule delivery of the 1st cycle in the next 3 days.  I called patient to alert her of good news and to tell her to look out for a call from [employer] for medication delivery.  Patient expressed understanding and appreciation.  Oral Chemo Clinic will continue to follow.  Johny Drilling, PharmD, BCPS 06/13/2016  4:45 PM Oral Chemotherapy Clinic [phone]

## 2016-06-15 LAB — CULTURE, BLOOD (ROUTINE X 2)
CULTURE: NO GROWTH
CULTURE: NO GROWTH

## 2016-06-17 ENCOUNTER — Encounter: Payer: Self-pay | Admitting: Pharmacist

## 2016-06-17 ENCOUNTER — Ambulatory Visit
Admission: RE | Admit: 2016-06-17 | Discharge: 2016-06-17 | Disposition: A | Discharge status: Home / Self Care | Payer: BC Managed Care – PPO | Source: Ambulatory Visit | Attending: Radiation Oncology | Admitting: Radiation Oncology

## 2016-06-17 NOTE — Progress Notes (Signed)
Completed fax request from [facility] Questions: allergies, current medications & health conditions Returned completed fax to fax# [phone].  Kennith Center, Pharm.D., CPP 06/17/2016'@2'$ :[facility]

## 2016-06-18 ENCOUNTER — Encounter: Payer: Self-pay | Admitting: *Deleted

## 2016-06-18 NOTE — Progress Notes (Signed)
Oncology Nurse Navigator Documentation  Oncology Nurse Navigator Flowsheets 06/18/2016  Navigator Location [facility]  Navigator Encounter Type Telephone;Other/patient's partner wants to know if patient can have IV fluids at home with home health.  I received a call from home health and patient's insurance will not cover this expense.  Patient has to come into cancer center if fluids needed. Partner updated.   Telephone Outgoing Call  Treatment Phase Treatment  Barriers/Navigation Needs Coordination of Care  Interventions Coordination of Care  Coordination of Care Other  Acuity Level 2  Acuity Level 2 Other  Time Spent with Patient 30

## 2016-06-19 ENCOUNTER — Encounter: Payer: Self-pay | Admitting: Internal Medicine

## 2016-06-19 ENCOUNTER — Telehealth: Payer: Self-pay | Admitting: Internal Medicine

## 2016-06-19 ENCOUNTER — Ambulatory Visit
Admission: RE | Admit: 2016-06-19 | Discharge: 2016-06-19 | Disposition: A | Discharge status: Home / Self Care | Payer: BC Managed Care – PPO | Source: Ambulatory Visit | Attending: Radiation Oncology | Admitting: Radiation Oncology

## 2016-06-19 ENCOUNTER — Other Ambulatory Visit: Payer: Self-pay

## 2016-06-19 ENCOUNTER — Other Ambulatory Visit: Payer: Self-pay | Admitting: Oncology

## 2016-06-19 ENCOUNTER — Encounter: Payer: Self-pay | Admitting: Radiation Oncology

## 2016-06-19 ENCOUNTER — Ambulatory Visit: Payer: BC Managed Care – PPO

## 2016-06-19 ENCOUNTER — Ambulatory Visit (HOSPITAL_BASED_OUTPATIENT_CLINIC_OR_DEPARTMENT_OTHER): Payer: BC Managed Care – PPO

## 2016-06-19 ENCOUNTER — Ambulatory Visit (HOSPITAL_BASED_OUTPATIENT_CLINIC_OR_DEPARTMENT_OTHER): Payer: BC Managed Care – PPO | Admitting: Internal Medicine

## 2016-06-19 ENCOUNTER — Other Ambulatory Visit (HOSPITAL_BASED_OUTPATIENT_CLINIC_OR_DEPARTMENT_OTHER): Payer: BC Managed Care – PPO

## 2016-06-19 VITALS — BP 107/80 | HR 122 | Resp 16 | Ht 67.0 in | Wt 132.6 lb

## 2016-06-19 VITALS — BP 107/80 | HR 122 | Resp 18

## 2016-06-19 DIAGNOSIS — C3411 Malignant neoplasm of upper lobe, right bronchus or lung: Secondary | ICD-10-CM

## 2016-06-19 DIAGNOSIS — R110 Nausea: Secondary | ICD-10-CM

## 2016-06-19 DIAGNOSIS — E860 Dehydration: Secondary | ICD-10-CM

## 2016-06-19 DIAGNOSIS — Z5111 Encounter for antineoplastic chemotherapy: Secondary | ICD-10-CM

## 2016-06-19 DIAGNOSIS — D649 Anemia, unspecified: Secondary | ICD-10-CM

## 2016-06-19 DIAGNOSIS — C7931 Secondary malignant neoplasm of brain: Secondary | ICD-10-CM

## 2016-06-19 DIAGNOSIS — R531 Weakness: Secondary | ICD-10-CM

## 2016-06-19 DIAGNOSIS — Z79899 Other long term (current) drug therapy: Secondary | ICD-10-CM | POA: Insufficient documentation

## 2016-06-19 DIAGNOSIS — C7951 Secondary malignant neoplasm of bone: Secondary | ICD-10-CM

## 2016-06-19 DIAGNOSIS — G893 Neoplasm related pain (acute) (chronic): Secondary | ICD-10-CM

## 2016-06-19 DIAGNOSIS — D630 Anemia in neoplastic disease: Secondary | ICD-10-CM

## 2016-06-19 DIAGNOSIS — E43 Unspecified severe protein-calorie malnutrition: Secondary | ICD-10-CM

## 2016-06-19 LAB — COMPREHENSIVE METABOLIC PANEL
ALT: 70 U/L — ABNORMAL HIGH (ref 0–55)
ANION GAP: 11 meq/L (ref 3–11)
AST: 47 U/L — ABNORMAL HIGH (ref 5–34)
Albumin: 2.1 g/dL — ABNORMAL LOW (ref 3.5–5.0)
Alkaline Phosphatase: 88 U/L (ref 40–150)
BUN: 29.4 mg/dL — ABNORMAL HIGH (ref 7.0–26.0)
CALCIUM: 9.4 mg/dL (ref 8.4–10.4)
CHLORIDE: 104 meq/L (ref 98–109)
CO2: 24 mEq/L (ref 22–29)
CREATININE: 0.7 mg/dL (ref 0.6–1.1)
EGFR: 88 mL/min/{1.73_m2} — ABNORMAL LOW (ref >90)
Glucose: 122 mg/dl (ref 70–140)
POTASSIUM: 4 meq/L (ref 3.5–5.1)
Sodium: 139 mEq/L (ref 136–145)
Total Bilirubin: 0.31 mg/dL (ref 0.20–1.20)
Total Protein: 7.1 g/dL (ref 6.4–8.3)

## 2016-06-19 LAB — CBC WITH DIFFERENTIAL/PLATELET
BASO%: 0.1 % (ref 0.0–2.0)
BASOS ABS: 0 10*3/uL (ref 0.0–0.1)
EOS%: 1.1 % (ref 0.0–7.0)
Eosinophils Absolute: 0.1 10*3/uL (ref 0.0–0.5)
HEMATOCRIT: 32 % — AB (ref 34.8–46.6)
HGB: 10 g/dL — ABNORMAL LOW (ref 11.6–15.9)
LYMPH#: 0.7 10*3/uL — AB (ref 0.9–3.3)
LYMPH%: 9.1 % — ABNORMAL LOW (ref 14.0–49.7)
MCH: 29.4 pg (ref 25.1–34.0)
MCHC: 31.3 g/dL — AB (ref 31.5–36.0)
MCV: 94.1 fL (ref 79.5–101.0)
MONO#: 0.5 10*3/uL (ref 0.1–0.9)
MONO%: 6.7 % (ref 0.0–14.0)
NEUT#: 6 10*3/uL (ref 1.5–6.5)
NEUT%: 83 % — AB (ref 38.4–76.8)
PLATELETS: 131 10*3/uL — AB (ref 145–400)
RBC: 3.4 10*6/uL — ABNORMAL LOW (ref 3.70–5.45)
RDW: 17.6 % — ABNORMAL HIGH (ref 11.2–14.5)
WBC: 7.3 10*3/uL (ref 3.9–10.3)

## 2016-06-19 LAB — TSH: TSH: 1.574 m(IU)/L (ref 0.308–3.960)

## 2016-06-19 MED ORDER — SODIUM CHLORIDE 0.9 % IV SOLN
INTRAVENOUS | Status: AC
  Administered 2016-06-19: 13:00:00 via INTRAVENOUS

## 2016-06-19 NOTE — Patient Instructions (Signed)
Dehydration, Adult Dehydration is a condition in which there is not enough fluid or water in the body. This happens when you lose more fluids than you take in. Important organs, such as the kidneys, brain, and heart, cannot function without a proper amount of fluids. Any loss of fluids from the body can lead to dehydration. Dehydration can range from mild to severe. This condition should be treated right away to prevent it from becoming severe. What are the causes? This condition may be caused by:  Vomiting.  Diarrhea.  Excessive sweating, such as from heat exposure or exercise.  Not drinking enough fluid, especially:  When ill.  While doing activity that requires a lot of energy.  Excessive urination.  Fever.  Infection.  Certain medicines, such as medicines that cause the body to lose excess fluid (diuretics).  Inability to access safe drinking water.  Reduced physical ability to get adequate water and food. What increases the risk? This condition is more likely to develop in people:  Who have a poorly controlled long-term (chronic) illness, such as diabetes, heart disease, or kidney disease.  Who are age 65 or older.  Who are disabled.  Who live in a place with high altitude.  Who play endurance sports. What are the signs or symptoms? Symptoms of mild dehydration may include:   Thirst.  Dry lips.  Slightly dry mouth.  Dry, warm skin.  Dizziness. Symptoms of moderate dehydration may include:   Very dry mouth.  Muscle cramps.  Dark urine. Urine may be the color of tea.  Decreased urine production.  Decreased tear production.  Heartbeat that is irregular or faster than normal (palpitations).  Headache.  Light-headedness, especially when you stand up from a sitting position.  Fainting (syncope). Symptoms of severe dehydration may include:   Changes in skin, such as:  Cold and clammy skin.  Blotchy (mottled) or pale skin.  Skin that does  not quickly return to normal after being lightly pinched and released (poor skin turgor).  Changes in body fluids, such as:  Extreme thirst.  No tear production.  Inability to sweat when body temperature is high, such as in hot weather.  Very little urine production.  Changes in vital signs, such as:  Weak pulse.  Pulse that is more than 100 beats a minute when sitting still.  Rapid breathing.  Low blood pressure.  Other changes, such as:  Sunken eyes.  Cold hands and feet.  Confusion.  Lack of energy (lethargy).  Difficulty waking up from sleep.  Short-term weight loss.  Unconsciousness. How is this diagnosed? This condition is diagnosed based on your symptoms and a physical exam. Blood and urine tests may be done to help confirm the diagnosis. How is this treated? Treatment for this condition depends on the severity. Mild or moderate dehydration can often be treated at home. Treatment should be started right away. Do not wait until dehydration becomes severe. Severe dehydration is an emergency and it needs to be treated in a hospital. Treatment for mild dehydration may include:   Drinking more fluids.  Replacing salts and minerals in your blood (electrolytes) that you may have lost. Treatment for moderate dehydration may include:   Drinking an oral rehydration solution (ORS). This is a drink that helps you replace fluids and electrolytes (rehydrate). It can be found at pharmacies and retail stores. Treatment for severe dehydration may include:   Receiving fluids through an IV tube.  Receiving an electrolyte solution through a feeding tube that is   passed through your nose and into your stomach (nasogastric tube, or NG tube).  Correcting any abnormalities in electrolytes.  Treating the underlying cause of dehydration. Follow these instructions at home:  If directed by your health care provider, drink an ORS:  Make an ORS by following instructions on the  package.  Start by drinking small amounts, about  cup (120 mL) every 5-10 minutes.  Slowly increase how much you drink until you have taken the amount recommended by your health care provider.  Drink enough clear fluid to keep your urine clear or pale yellow. If you were told to drink an ORS, finish the ORS first, then start slowly drinking other clear fluids. Drink fluids such as:  Water. Do not drink only water. Doing that can lead to having too little salt (sodium) in the body (hyponatremia).  Ice chips.  Fruit juice that you have added water to (diluted fruit juice).  Low-calorie sports drinks.  Avoid:  Alcohol.  Drinks that contain a lot of sugar. These include high-calorie sports drinks, fruit juice that is not diluted, and soda.  Caffeine.  Foods that are greasy or contain a lot of fat or sugar.  Take over-the-counter and prescription medicines only as told by your health care provider.  Do not take sodium tablets. This can lead to having too much sodium in the body (hypernatremia).  Eat foods that contain a healthy balance of electrolytes, such as bananas, oranges, potatoes, tomatoes, and spinach.  Keep all follow-up visits as told by your health care provider. This is important. Contact a health care provider if:  You have abdominal pain that:  Gets worse.  Stays in one area (localizes).  You have a rash.  You have a stiff neck.  You are more irritable than usual.  You are sleepier or more difficult to wake up than usual.  You feel weak or dizzy.  You feel very thirsty.  You have urinated only a small amount of very dark urine over 6-8 hours. Get help right away if:  You have symptoms of severe dehydration.  You cannot drink fluids without vomiting.  Your symptoms get worse with treatment.  You have a fever.  You have a severe headache.  You have vomiting or diarrhea that:  Gets worse.  Does not go away.  You have blood or green matter  (bile) in your vomit.  You have blood in your stool. This may cause stool to look black and tarry.  You have not urinated in 6-8 hours.  You faint.  Your heart rate while sitting still is over 100 beats a minute.  You have trouble breathing. This information is not intended to replace advice given to you by your health care provider. Make sure you discuss any questions you have with your health care provider. Document Released: 07/22/2005 Document Revised: 02/16/2016 Document Reviewed: 09/15/2015 Elsevier Interactive Patient Education  2017 Elsevier Inc.  

## 2016-06-19 NOTE — Progress Notes (Addendum)
Miss Laura Walter. [name] is here for follow up appointment lung cancer with brain metastases  Fatigue:Having fatigue all during the day. Hair loss:None Nausea and vomiting:Once or twice in the last month, usually if she is coughing clear secretion. Skin changes:None to hair line. Headache/Blurred vision:None Urinary and Bladder Change What are your plans with Dr. Earlie Server: Receive Tagrisso and started taking it 06-17-16 it's a new targeted cancer medication. 06-11-16 MRI Brain w wo contrast Osmolite peg feeding tid and Prostat by mouth daily. Wt Readings from Last 3 Encounters:  06/19/16 132 lb 9.6 oz (60.1 kg)  06/09/16 133 lb (60.3 kg)  05/29/16 133 lb (60.3 kg)  BP 107/80   Pulse (!) 122   Resp 16   Ht '5\' 7"'$  (1.702 m)   Wt 132 lb 9.6 oz (60.1 kg)   SpO2 98%   BMI 20.77 kg/m unable to get her temperature

## 2016-06-19 NOTE — Telephone Encounter (Signed)
Appointments scheduled per 06/19/16 los. Follow up appointment, scheduled for 07/03/16 was confirmed with patient.Marland Kitchen

## 2016-06-19 NOTE — Progress Notes (Signed)
Radiation Oncology         [phone] ________________________________  Name: Laura Walter MRN: [id number]  Date: 05/13/16 DOB: [birth year]  [place]:GURKYHCWC Birdie Riddle, MD  Curt Bears, MD     REFERRING PHYSICIAN: Curt Bears, MD   DIAGNOSIS: The primary encounter diagnosis was Malignant neoplasm of upper lobe of right lung [facility]). Diagnoses of Brain metastases ([place]) and Metastasis to bone [facility]) were also pertinent to this visit.   HISTORY OF PRESENT ILLNESS: Laura Walter is a 65 y.o. female seen at the request of Dr. Julien Nordmann for a new brain metastasis. The patient is well known to our service who has had previous treatment to the brain and lung. She presented to the ED on 05/11/16 with progressive weakness and failure to thrive. She did undergo repeat imaging in the ED that did not reveal any acute findings, or new metastatic concerns. We were asked to see the patient due to the fact that there was a new brain met seen in the right cerebellum  Measuring 5m on an MRI. At that time she had significant deconditioning and Dr. MLisbeth Renshawrecommended repeating an MRI in one month once she had medically improved. She did have a repeat admission earlier this month and underwent a 1.5T MRI on  06/12/16 which revealed the previous cerebellar metastasis measuring 3 mm, and a new right cerebellar metastasis measuring 668m The remainder of her disease was stable. She comes today to discuss her MRI and recommendations of care. [place] Mo[place]ien Nordmannhis [place].   PREVIOUS RADIATION THERAPY: Yes  02/15/2016 to 03/04/2016:     1. T8 - T10 was treated to 30 Gy in 10 fractions at 3 Gy per fraction. 2. The Right upper lobe 2.7 cm was treated to 30 Gy in 12 fractions at 2.5 Gy per fraction.  3. The Left frontal lobe 5.3 mm was treated to 20 Gy in 1 fraction.  4. The cervical spine was treated to 20 Gy in 4 fractions at 5 Gy per fraction.  Completed radiation 11/24/2015 to the right upper lung to 50  Gy in 8 fractions.   Completed radiation 01/25/2015 - 02/08/2015 to the spine with a 3-D conformal technique on our tomotherapy unit and with a course of whole brain radiation treatment, also delivered on our tomotherapy unit in the 3-D conformal technique mode. These treatments were delivered concurrently.   PAST MEDICAL HISTORY:  Past Medical History:  Diagnosis Date  . Allergy    seasonal  . Bone cancer ([facility]16 PET   lytic lesion lt 1st rib,T9,L3 Lt acetabulum  . [place] ([place]   MRI multiple small brain mets 10 individual lesions  . Bronchitis   . H/O reactive hypoglycemia   . IBS (irritable bowel syndrome)   . [place] cancer ([place]   stageIV nscca rul  . Osteopenia   . Paronychia 11/17/2015  . Pneumonia 2009 & 2011  . S/P radiation therapy completed 02/08/15   whole brain ,spine  . S/P radiation therapy completed 11/27/15   lung       PAST SURGICAL HISTORY: Past Surgical History:  Procedure Laterality Date  . COLONOSCOPY  2009   Negative; [facility]  . ESOPHAGOGASTRODUODENOSCOPY (EGD) WITH PROPOFOL N/A 04/11/2015   Procedure: ESOPHAGOGASTRODUODENOSCOPY (EGD) WITH PROPOFOL;  Surgeon: PaCarol AdaMD;  Location: [facility];  Service: Endoscopy;  Laterality: N/A;  . PEG PLACEMENT N/A 04/11/2015   Procedure: PERCUTANEOUS ENDOSCOPIC GASTROSTOMY (PEG) PLACEMENT;  Surgeon: PaCarol AdaMD;  Location: [facility] ENDOSCOPY;  Service: Endoscopy;  Laterality: N/A;  . TONSILLECTOMY AND ADENOIDECTOMY    . WISDOM TOOTH EXTRACTION       FAMILY HISTORY:  Family History  Problem Relation Age of Onset  . Transient ischemic attack Mother   . Mental illness Mother     Dementia  . Diabetes Father   . Heart attack Father 7  . Heart attack Maternal Grandfather     in 33s  . Stomach cancer Maternal Aunt   . Heart attack Maternal Aunt     in 65s  . Diabetes Paternal Uncle     X 3     SOCIAL HISTORY:  reports that she has never smoked. She has never used smokeless tobacco. She  reports that she drinks alcohol. She reports that she does not use drugs. The patient is accompanied by her partner [name]. She lives in [place].   ALLERGIES: Sulfonamide derivatives   MEDICATIONS:  Current Outpatient Prescriptions  Medication Sig Dispense Refill  . Amino Acids-Protein Hydrolys (FEEDING SUPPLEMENT, PRO-STAT SUGAR FREE 64,) LIQD Take 30 mLs by mouth 2 (two) times daily. 900 mL 0  . doxycycline (VIBRA-TABS) 100 MG tablet Take 1 tablet (100 mg total) by mouth 2 (two) times daily. 14 tablet 0  . FeFum-FePoly-FA-B Cmp-C-Biot (INTEGRA PLUS) CAPS Take 1 capsule by mouth every morning. 90 capsule 0  . levETIRAcetam (KEPPRA) 500 MG tablet 1 tablet in the morning and 1.5 tablets in the evening (Patient taking differently: Take 500-750 mg by mouth 2 (two) times daily. 1 tablet in the morning and 1.5 tablets in the evening) 80 tablet 2  . Nutritional Supplements (FEEDING SUPPLEMENT, OSMOLITE 1.5 CAL,) LIQD Place 237 mLs into feeding tube 4 (four) times daily. (Patient taking differently: Place 237 mLs into feeding tube 3 (three) times daily. ) 958 mL 12  . OLANZapine (ZYPREXA) 10 MG tablet TAKE 1 TABLET (10 MG TOTAL) BY MOUTH AT BEDTIME. (INS PAYS 12/13/2015) 30 tablet 0  . osimertinib mesylate (TAGRISSO) 80 MG tablet Take 1 tablet (80 mg total) by mouth daily. 30 tablet 2  . senna-docusate (SENOKOT-S) 8.6-50 MG tablet Take 1 tablet by mouth 2 (two) times daily. 30 tablet 0  . acetaminophen (TYLENOL) 325 MG tablet Take 650 mg by mouth every 6 (six) hours as needed for mild pain, moderate pain, fever or headache.     Marland Kitchen HYDROcodone-acetaminophen (NORCO) 5-325 MG tablet Take 1-2 tablets by mouth every 4 (four) hours as needed for moderate pain. (Patient not taking: Reported on 06/19/2016) 60 tablet 0  . Menthol, Topical Analgesic, (BIOFREEZE) 4 % GEL Apply 1 application topically 4 (four) times daily as needed (pain). Apply to trapezius     . polyethylene glycol (MIRALAX / GLYCOLAX) packet  Take 17 g by mouth daily as needed. (Patient taking differently: Take 17 g by mouth daily as needed for mild constipation or moderate constipation. ) 14 each 0  . traMADol (ULTRAM) 50 MG tablet Take 1 tablet (50 mg total) by mouth every 6 (six) hours as needed. (Patient not taking: Reported on 06/19/2016) 60 tablet 0   No current facility-administered medications for this encounter.      REVIEW OF SYSTEMS: On review of systems, the patient reports that she continues to have significant weakness and reports that she needs help with movement at home. She has a new device to help her be able to move around the house. She reports that she has fatigue and persistent though improved lower extremity weakness. She  denies any chest pain, shortness of breath, cough, fevers, chills, night sweats, unintended weight changes. She denies any bowel or bladder disturbances, and denies abdominal pain, nausea or vomiting. She denies any new musculoskeletal or joint aches or pains. She denies any visual or auditory disturbances. She is not having any concerns with headaches. A complete review of systems is obtained and is otherwise negative.   PHYSICAL EXAM:  height is 5' 7"  (1.702 m) and weight is 132 lb 9.6 oz (60.1 kg). Her blood pressure is 107/80 and her pulse is 122 (abnormal). Her respiration is 16 and oxygen saturation is 98%.   In general this is a chronically ill, cachectic caucasian female in no acute distress. She is alert and oriented x4 and appropriate throughout the examination. Cardiopulmonary assessment is negative for acute distress and she exhibits normal effort. She sits with her neck bent and reports this is more comfortable.   ECOG = 3  0 - Asymptomatic (Fully active, able to carry on all predisease activities without restriction)  1 - Symptomatic but completely ambulatory (Restricted in physically strenuous activity but ambulatory and able to carry out work of a light or sedentary nature. For  example, light housework, office work)  2 - Symptomatic, <50% in bed during the day (Ambulatory and capable of all self care but unable to carry out any work activities. Up and about more than 50% of waking hours)  3 - Symptomatic, >50% in bed, but not bedbound (Capable of only limited self-care, confined to bed or chair 50% or more of waking hours)  4 - Bedbound (Completely disabled. Cannot carry on any self-care. Totally confined to bed or chair)  5 - Death   Eustace Pen MM, Creech RH, Tormey DC, et al. [phone]). "Toxicity and response criteria of the [facility]". [place] Oncol. 5 (6): 649-55    LABORATORY DATA:  Lab Results  Component Value Date   WBC 7.3 06/19/2016   HGB 10.0 (L) 06/19/2016   HCT 32.0 (L) 06/19/2016   MCV 94.1 06/19/2016   PLT 131 (L) 06/19/2016   Lab Results  Component Value Date   NA 139 06/19/2016   K 4.0 06/19/2016   CL 107 06/10/2016   CO2 24 06/19/2016   Lab Results  Component Value Date   ALT 70 (H) 06/19/2016   AST 47 (H) 06/19/2016   ALKPHOS 88 06/19/2016   BILITOT 0.31 06/19/2016      RADIOGRAPHY: Dg Chest 2 View  Result Date: 06/09/2016 CLINICAL DATA:  Weakness for 3 days. History of lung, bone and brain cancer EXAM: CHEST  2 VIEW COMPARISON:  05/14/2016 CXR, 05/11/2016 chest CT FINDINGS: Slight increase in moderate right effusion spanning at least 1.5 vertebral body heights. Apical mass in the right upper lobe is unchanged. No overt pulmonary edema. The heart is top-normal in size. No aortic aneurysm. Known osteoblastic appearing lesions of T9, L2 and L3 remain stable. IMPRESSION: Slight increase in right pleural effusion since the interim. Stable right apical masslike opacity. Osteoblastic disease involving T9, L2 and L3 remain grossly unchanged. Electronically Signed   By: Ashley Royalty M.D.   On: 06/09/2016 22:41   Mr Jeri Cos QM Contrast  Result Date: 06/11/2016 CLINICAL DATA:  Lung cancer with brain metastases.  Generalized weakness and fever. Bilateral lower extremity weakness. EXAM: MRI HEAD WITHOUT AND WITH CONTRAST TECHNIQUE: Multiplanar, multiecho pulse sequences of the brain and surrounding structures were obtained without and with intravenous contrast. CONTRAST:  15 mL MultiHance  COMPARISON:  05/12/2016 FINDINGS: Brain: There is no evidence of acute infarct, intracranial hemorrhage, midline shift, or extra-axial fluid collection. Mild cerebral atrophy is unchanged. Confluent T2 hyperintensities in the cerebral white matter bilaterally are stable and compatible with postradiation changes. 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 A 3 mm enhancing lesion in the inferior cerebellar vermis on the right has slightly enlarged (series 12, image 12, previously 2 mm). There is a 6 mm ring-enhancing lesion more laterally in the inferior right cerebellum, not clearly present on the prior study and with minimal surrounding edema (series 12, image 10). 5 mm lesion in the posterior left frontal lobe at the vertex has not significantly changed (series 12, image 45). A punctate 2 mm focus of cortical enhancement slightly more superiorly and anteriorly in the left frontal lobe at the vertex is in retrospect unchanged from 05/12/2016 but new from 03/10/2016 (series 12, image 48). Punctate 2 mm enhancing cortical lesion versus vessel more inferiorly in the left frontal lobe is unchanged (series 12, image 38). Vascular: Major intracranial vascular flow voids are preserved. Skull and upper cervical spine: Unremarkable bone marrow signal. Sinuses/Orbits: Unremarkable orbits. Minimal right maxillary sinus mucosal thickening and small right mastoid effusion. Other: None. IMPRESSION: 1. New 6 mm right cerebellar metastasis.  Trace edema. 2. Slightly increased size of 3 mm right cerebellar metastasis. 3. Unchanged small left frontal lobe metastases. 4. No acute infarct. Electronically Signed   By: Logan Bores M.D.   On:  06/11/2016 19:10       IMPRESSION/PLAN: 1. 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 The patient continues to be interested in aggressive therapy and is not interested in palliative care or hospice at this time. Dr. Lisbeth Renshaw discusses the findings from her recent brain MRI and recommends proceeding with radiotherapy to the two lesions in the cerebellum to avoid progression which would likely cause symptomatology. She will continue on [place] with Dr. Julien Nordmann. We will plan her simulation and treatment with our brain navigator. We reviewed the risks, benefits, short, and long term effects of treatment. She is interested in moving forward.   The above documentation reflects my direct findings during this shared patient visit. Please see the separate note by Dr. Lisbeth Renshaw on this date for the remainder of the patient's plan of care.     Carola Rhine, PAC

## 2016-06-19 NOTE — Progress Notes (Signed)
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 Lisbeth Renshaw. 2) Gilotrif 40 mg by mouth daily. Status post 5 months of treatment. 3) stereotactic radiotherapy to the enlarging right upper lobe lung mass under the care of Dr. Lisbeth Renshaw. 4) stereotactic radiotherapy to another right upper lobe lung mass as well as solitary brain metastases. 5) Gilotrif 30 mg by mouth daily. First dose started 08/31/2015. Status post 6 months of treatment, discontinued secondary to disease progression. 6) palliative radiotherapy to the progressive lesion in the right side of the chest as well as the metastatic bone lesion involving C6 and C7 vertebrae with epidural tumor in the lateral recess as well as palliative radiotherapy to T8-the in the spine, under the care of Dr. Lisbeth Renshaw completed on 03/04/2016. 7) Systemic chemotherapy with carboplatin for AUC of 5, Alimta 500 MG/M2 and Ketruda (pembrolizumab) 200 MG/M2 every 3 weeks. First dose 03/13/2016. Status post 2 cycles discontinued secondary to intolerance and severe thrombocytopenia.   CURRENT THERAPY:  1) Tagrisso 80 mg by mouth daily started 06/17/2016. 2) Xgeva 120 mg subcutaneously every 4 weeks. First dose 05/01/2015.  INTERVAL HISTORY: Laura Walter 65 y.o. female returns to the clinic today for follow-up visit accompanied by her partner. The patient continues to complain of increasing fatigue and weakness. She also has shortness of breath with exertion but no significant  chest pain, cough or hemoptysis. She was admitted to [facility] recently with persistent anemia and she will receive 2 units of PRBCs transfusion. Her hemoglobin is up to 10.0 G/DL today. She also has lack of appetite and weight loss. She is tachycardic today secondary to poor by mouth intake and dehydration. She denied having any significant nausea or vomiting. She has no fever or chills. Her previous systemic chemotherapy was discontinued secondary to intolerance and the patient has been off treatment for the last few weeks. She was also found recently to have new small brain metastasis and she is expected to undergo stereotactic radiotherapy to these lesions soon. I consider the patient for treatment with Tagrisso and we were able to get approval from [place] for the drug coverage. She states today for evaluation and discussion of her treatment options with the start of the new medicine.  MEDICAL HISTORY: Past Medical History:  Diagnosis Date  . Allergy    seasonal  . Bone cancer ([place]) 01/10/15 PET   lytic lesion lt 1st rib,T9,L3 Lt acetabulum  . Brain cancer ([place]) 01/13/15   MRI multiple small brain mets 10 individual lesions  . Bronchitis   . H/O reactive hypoglycemia   . IBS (irritable bowel syndrome)   . Lung cancer ([place])    stageIV nscca rul  . Osteopenia   . Paronychia 11/17/2015  . Pneumonia 2009 & 2011  . S/P radiation therapy completed 02/08/15   whole brain ,spine  . S/P radiation therapy completed 11/27/15   lung    ALLERGIES:  is allergic to sulfonamide derivatives.  MEDICATIONS:  Current Outpatient Prescriptions  Medication Sig Dispense Refill  .  acetaminophen (TYLENOL) 325 MG tablet Take 650 mg by mouth every 6 (six) hours as needed for mild pain, moderate pain, fever or headache.     . Amino Acids-Protein Hydrolys (FEEDING SUPPLEMENT, PRO-STAT SUGAR FREE 64,) LIQD Take 30 mLs by mouth 2 (two) times daily. 900 mL 0  . doxycycline (VIBRA-TABS) 100 MG tablet Take  1 tablet (100 mg total) by mouth 2 (two) times daily. 14 tablet 0  . FeFum-FePoly-FA-B Cmp-C-Biot (INTEGRA PLUS) CAPS Take 1 capsule by mouth every morning. 90 capsule 0  . HYDROcodone-acetaminophen (NORCO) 5-325 MG tablet Take 1-2 tablets by mouth every 4 (four) hours as needed for moderate pain. (Patient not taking: Reported on 06/19/2016) 60 tablet 0  . levETIRAcetam (KEPPRA) 500 MG tablet 1 tablet in the morning and 1.5 tablets in the evening (Patient taking differently: Take 500-750 mg by mouth 2 (two) times daily. 1 tablet in the morning and 1.5 tablets in the evening) 80 tablet 2  . Menthol, Topical Analgesic, (BIOFREEZE) 4 % GEL Apply 1 application topically 4 (four) times daily as needed (pain). Apply to trapezius     . Nutritional Supplements (FEEDING SUPPLEMENT, OSMOLITE 1.5 CAL,) LIQD Place 237 mLs into feeding tube 4 (four) times daily. (Patient taking differently: Place 237 mLs into feeding tube 3 (three) times daily. ) 958 mL 12  . OLANZapine (ZYPREXA) 10 MG tablet TAKE 1 TABLET (10 MG TOTAL) BY MOUTH AT BEDTIME. (INS PAYS 12/13/2015) 30 tablet 0  . osimertinib mesylate (TAGRISSO) 80 MG tablet Take 1 tablet (80 mg total) by mouth daily. 30 tablet 2  . polyethylene glycol (MIRALAX / GLYCOLAX) packet Take 17 g by mouth daily as needed. (Patient taking differently: Take 17 g by mouth daily as needed for mild constipation or moderate constipation. ) 14 each 0  . senna-docusate (SENOKOT-S) 8.6-50 MG tablet Take 1 tablet by mouth 2 (two) times daily. 30 tablet 0  . traMADol (ULTRAM) 50 MG tablet Take 1 tablet (50 mg total) by mouth every 6 (six) hours as needed. (Patient not taking: Reported on 06/19/2016) 60 tablet 0   No current facility-administered medications for this visit.     SURGICAL HISTORY:  Past Surgical History:  Procedure Laterality Date  . COLONOSCOPY  2009   Negative; [facility]  . ESOPHAGOGASTRODUODENOSCOPY (EGD) WITH PROPOFOL N/A 04/11/2015   Procedure:  ESOPHAGOGASTRODUODENOSCOPY (EGD) WITH PROPOFOL;  Surgeon: Carol Ada, MD;  Location: [facility];  Service: Endoscopy;  Laterality: N/A;  . PEG PLACEMENT N/A 04/11/2015   Procedure: PERCUTANEOUS ENDOSCOPIC GASTROSTOMY (PEG) PLACEMENT;  Surgeon: Carol Ada, MD;  Location: [facility];  Service: Endoscopy;  Laterality: N/A;  . TONSILLECTOMY AND ADENOIDECTOMY    . WISDOM TOOTH EXTRACTION      REVIEW OF SYSTEMS:  Constitutional: positive for anorexia, fatigue and weight loss Eyes: negative Ears, nose, mouth, throat, and face: negative Respiratory: positive for dyspnea on exertion Cardiovascular: negative Gastrointestinal: negative Genitourinary:negative Integument/breast: negative Hematologic/lymphatic: negative Musculoskeletal:positive for bone pain and muscle weakness Neurological: negative Behavioral/Psych: negative Endocrine: negative Allergic/Immunologic: negative   PHYSICAL EXAMINATION: General appearance: alert, cooperative, fatigued and no distress Head: Normocephalic, without obvious abnormality, Several areas of bruises and ecchymosis on the right side of the face Neck: no adenopathy, no JVD, supple, symmetrical, trachea midline and thyroid not enlarged, symmetric, no tenderness/mass/nodules Lymph nodes: Cervical, supraclavicular, and axillary nodes normal. Resp: clear to auscultation bilaterally Back: Tenderness to palpation at the lower thoracic and lumbar vertebrae. Cardio: regular rate and rhythm, S1, S2 normal, no murmur, click, rub  or gallop GI: soft, non-tender; bowel sounds normal; no masses,  no organomegaly Extremities: extremities normal, atraumatic, no cyanosis or edema Neurologic: Alert and oriented X 3, normal strength and tone. Normal symmetric reflexes. Normal coordination and gait    ECOG PERFORMANCE STATUS: 2 - Symptomatic, <50% confined to bed  Blood pressure 107/80, pulse (!) 122, resp. rate 18, SpO2 98 %.  LABORATORY DATA: Lab Results  Component  Value Date   WBC 7.3 06/19/2016   HGB 10.0 (L) 06/19/2016   HCT 32.0 (L) 06/19/2016   MCV 94.1 06/19/2016   PLT 131 (L) 06/19/2016      Chemistry      Component Value Date/Time   NA 137 06/10/2016 1342   NA 138 05/29/2016 1141   K 3.8 06/10/2016 1342   K 4.2 05/29/2016 1141   CL 107 06/10/2016 1342   CO2 25 06/10/2016 1342   CO2 24 05/29/2016 1141   BUN 22 (H) 06/10/2016 1342   BUN 31.6 (H) 05/29/2016 1141   CREATININE 0.78 06/10/2016 1342   CREATININE 0.8 05/29/2016 1141      Component Value Date/Time   CALCIUM 7.9 (L) 06/10/2016 1342   CALCIUM 9.4 05/29/2016 1141   ALKPHOS 73 06/11/2016 0511   ALKPHOS 108 05/29/2016 1141   AST 63 (H) 06/11/2016 0511   AST 46 (H) 05/29/2016 1141   ALT 86 (H) 06/11/2016 0511   ALT 93 (H) 05/29/2016 1141   BILITOT 0.5 06/11/2016 0511   BILITOT 0.28 05/29/2016 1141       RADIOGRAPHIC STUDIES: Dg Chest 2 View  Result Date: 06/09/2016 CLINICAL DATA:  Weakness for 3 days. History of lung, bone and brain cancer EXAM: CHEST  2 VIEW COMPARISON:  05/14/2016 CXR, 05/11/2016 chest CT FINDINGS: Slight increase in moderate right effusion spanning at least 1.5 vertebral body heights. Apical mass in the right upper lobe is unchanged. No overt pulmonary edema. The heart is top-normal in size. No aortic aneurysm. Known osteoblastic appearing lesions of T9, L2 and L3 remain stable. IMPRESSION: Slight increase in right pleural effusion since the interim. Stable right apical masslike opacity. Osteoblastic disease involving T9, L2 and L3 remain grossly unchanged. Electronically Signed   By: Ashley Royalty M.D.   On: 06/09/2016 22:41   Mr Jeri Cos BU Contrast  Result Date: 06/11/2016 CLINICAL DATA:  Lung cancer with brain metastases. Generalized weakness and fever. Bilateral lower extremity weakness. EXAM: MRI HEAD WITHOUT AND WITH CONTRAST TECHNIQUE: Multiplanar, multiecho pulse sequences of the brain and surrounding structures were obtained without and with  intravenous contrast. CONTRAST:  15 mL MultiHance COMPARISON:  05/12/2016 FINDINGS: Brain: There is no evidence of acute infarct, intracranial hemorrhage, midline shift, or extra-axial fluid collection. Mild cerebral atrophy is unchanged. Confluent T2 hyperintensities in the cerebral white matter bilaterally are stable and compatible with postradiation changes. 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 A 3 mm enhancing lesion in the inferior cerebellar vermis on the right has slightly enlarged (series 12, image 12, previously 2 mm). There is a 6 mm ring-enhancing lesion more laterally in the inferior right cerebellum, not clearly present on the prior study and with minimal surrounding edema (series 12, image 10). 5 mm lesion in the posterior left frontal lobe at the vertex has not significantly changed (series 12, image 45). A punctate 2 mm focus of cortical enhancement slightly more superiorly and anteriorly in the left frontal lobe at the vertex is in retrospect unchanged from 05/12/2016 but new from 03/10/2016 (series 12, image 48).  Punctate 2 mm enhancing cortical lesion versus vessel more inferiorly in the left frontal lobe is unchanged (series 12, image 38). Vascular: Major intracranial vascular flow voids are preserved. Skull and upper cervical spine: Unremarkable bone marrow signal. Sinuses/Orbits: Unremarkable orbits. Minimal right maxillary sinus mucosal thickening and small right mastoid effusion. Other: None. IMPRESSION: 1. New 6 mm right cerebellar metastasis.  Trace edema. 2. Slightly increased size of 3 mm right cerebellar metastasis. 3. Unchanged small left frontal lobe metastases. 4. No acute infarct. Electronically Signed   By: Logan Bores M.D.   On: 06/11/2016 19:10    ASSESSMENT AND PLAN: This is a very pleasant 65 years old never smoker white female recently diagnosed with stage IV non-small cell lung cancer, adenocarcinoma with positive EGFR mutation with deletion in exon 19  presented with large right upper lobe lung mass in addition to bilateral mediastinal, supraclavicular lymphadenopathy as well as bone and multiple brain metastasis diagnosed in June 2016.  The patient completed whole brain irradiation under the care of Dr. Lisbeth Renshaw. She was started on treatment with Gilotrif 40 mg by mouth daily status post 5 months but this was discontinued secondary to intolerance with significant bony contact, skin rash in the sacral area as well as few episodes of diarrhea  The patient was started on Gilotrif 30 mg by mouth daily since 08/31/2015. She is tolerating it much better with the reduced dose was less adverse effects. Unfortunately the recent CT scan of the chest, abdomen and pelvis showed interval increase in the size of the right upper lobe lung mass compatible with disease progression. She is currently undergoing stereotactic radiotherapy to the enlarging right upper lobe lung mass. Molecular studies from blood test as well as CT-guided core biopsy of the enlarging right upper lobe lung mass showed no evidence for T790M resistant mutation. She is tolerating her treatment with Gilotrif fairly well. Unfortunately the recent CT scan of the chest, abdomen and pelvis showed mixed response with new pleural based nodule adjacent to the previously treated right upper lobe mass. She underwent palliative radiotherapy to the new pleural-based nodule under the care of Dr. Lisbeth Renshaw. She is also undergoing stereotactic radiotherapy to new solitary brain metastasis. She also underwent palliative radiotherapy to the lesion in the cervical spines.  I had a lengthy discussion with the patient and her family today about her current disease status and treatment options. I recommended for the patient to discontinue her treatment with Gilotrif at this point. She was given the option of palliative care versus proceeding with systemic chemotherapy with carboplatin for AUC of 5, Alimta 500 MG/M2 and  Ketruda 200 MG IV every 3 weeks.  She was treated with 2 cycles of systemic chemotherapy with carboplatin, Alimta and Ketruda. She had that time of this treatment and had significant thrombocytopenia. Repeat imaging studies showed stable disease but the patient could not tolerate the treatment. She has been off treatment for several weeks now. We were able to get approval for Tagrisso 18 mg by mouth daily from [employer] and the patient started the treatment few days ago. I will arrange for the patient to have an EKG today as a baseline. For the metastatic bone disease, I continue the patient on Xgeva 120 mg subcutaneously on monthly basis. The patient was also advised to increased her intake of calcium supplement and vitamin D. For the nausea, she will continue on Zyprexa 10 mg by mouth daily at bedtime.  For pain management, she would continue on tramadol 50 mg  by mouth every 6 hours as needed for pain. For the anemia, she received 2 units of PRBCs transfusion recently and she will continue on Integra +1 capsule by mouth daily. For dehydration, I will arrange for the patient to receive IV fluid today. The patient would come back for follow-up visit in 2 weeks for evaluation and repeat blood work.  She was advised to call immediately if she has any concerning symptoms in the interval. The patient voices understanding of current disease status and treatment options and is in agreement with the current care plan.  All questions were answered. The patient knows to call the clinic with any problems, questions or concerns. We can certainly see the patient much sooner if necessary.  Disclaimer: This note was dictated with voice recognition software. Similar sounding words can inadvertently be transcribed and may not be corrected upon review.  Paronychia

## 2016-06-26 ENCOUNTER — Other Ambulatory Visit: Payer: BC Managed Care – PPO

## 2016-06-26 ENCOUNTER — Telehealth: Payer: Self-pay | Admitting: Neurology

## 2016-06-26 MED ORDER — LEVETIRACETAM 500 MG PO TABS: ORAL_TABLET | ORAL | 11 refills | Status: DC

## 2016-06-26 MED ORDER — LEVETIRACETAM 500 MG PO TABS: ORAL_TABLET | ORAL | 11 refills | Status: AC

## 2016-06-26 NOTE — Telephone Encounter (Signed)
Pt's caregiver request refill for levETIRAcetam (KEPPRA) 500 MG tablet sent [facility]

## 2016-06-26 NOTE — Telephone Encounter (Signed)
Pt's caregiver Limmie Patricia called back, she said the pt has 2 tabs left. Please call her when the RX has been sent  [phone]

## 2016-06-26 NOTE — Telephone Encounter (Signed)
Refills [facility] to [facility] as requested. Called [name] to let her know.

## 2016-06-30 ENCOUNTER — Ambulatory Visit
Admission: RE | Admit: 2016-06-30 | Discharge: 2016-06-30 | Disposition: A | Discharge status: Home / Self Care | Payer: BC Managed Care – PPO | Source: Ambulatory Visit | Attending: Radiation Oncology | Admitting: Radiation Oncology

## 2016-06-30 DIAGNOSIS — C7931 Secondary malignant neoplasm of brain: Secondary | ICD-10-CM

## 2016-06-30 DIAGNOSIS — C7949 Secondary malignant neoplasm of other parts of nervous system: Principal | ICD-10-CM

## 2016-06-30 MED ORDER — GADOBENATE DIMEGLUMINE 529 MG/ML IV SOLN
12.0000 mL | Freq: Once | INTRAVENOUS | Status: AC | PRN
  Administered 2016-06-30: 12 mL via INTRAVENOUS

## 2016-07-01 ENCOUNTER — Ambulatory Visit
Admission: RE | Admit: 2016-07-01 | Discharge: 2016-07-01 | Disposition: A | Discharge status: Home / Self Care | Payer: BC Managed Care – PPO | Source: Ambulatory Visit | Attending: Radiation Oncology | Admitting: Radiation Oncology

## 2016-07-01 VITALS — BP 106/69 | HR 110 | Temp 97.6°F | Wt 128.0 lb

## 2016-07-01 DIAGNOSIS — C7931 Secondary malignant neoplasm of brain: Secondary | ICD-10-CM

## 2016-07-01 DIAGNOSIS — Z510 Encounter for antineoplastic radiation therapy: Secondary | ICD-10-CM | POA: Diagnosis present

## 2016-07-01 DIAGNOSIS — Z79899 Other long term (current) drug therapy: Secondary | ICD-10-CM | POA: Diagnosis not present

## 2016-07-01 DIAGNOSIS — C7951 Secondary malignant neoplasm of bone: Secondary | ICD-10-CM | POA: Diagnosis not present

## 2016-07-01 DIAGNOSIS — C3411 Malignant neoplasm of upper lobe, right bronchus or lung: Secondary | ICD-10-CM | POA: Diagnosis not present

## 2016-07-01 MED ORDER — SODIUM CHLORIDE 0.9% FLUSH
10.0000 mL | Freq: Once | INTRAVENOUS | Status: AC
  Administered 2016-07-01: 10 mL via INTRAVENOUS

## 2016-07-01 NOTE — Progress Notes (Signed)
Does patient have an allergy to IV contrast dye?: No.   Has patient ever received premedication for IV contrast dye?: No.   Does patient take metformin?: No.  If patient does take metformin when was the last dose: not diabetic  Date of lab work: 06/19/16 BUN: 29.4 CR: 0.7  IV site: left wrist  Started by Med Onc RN Amy  Shackleton RN , #22 g 1 in catheter needle, x 1 , after 2 attempts by me and Tonie Bryant,RM,   BP 106/69 (BP Location: Left Arm, Patient Position: Sitting, Cuff Size: Normal)   Pulse (!) 110   Temp 97.6 F (36.4 C) (Oral)   Wt 128 lb (58.1 kg)   BMI 20.05 kg/m   Wt Readings from Last 3 Encounters:  07/01/16 128 lb (58.1 kg)  06/19/16 132 lb 9.6 oz (60.1 kg)  06/09/16 133 lb (60.3 kg)

## 2016-07-03 ENCOUNTER — Other Ambulatory Visit (HOSPITAL_BASED_OUTPATIENT_CLINIC_OR_DEPARTMENT_OTHER): Payer: BC Managed Care – PPO

## 2016-07-03 ENCOUNTER — Encounter: Payer: Self-pay | Admitting: Internal Medicine

## 2016-07-03 ENCOUNTER — Ambulatory Visit (HOSPITAL_BASED_OUTPATIENT_CLINIC_OR_DEPARTMENT_OTHER): Payer: BC Managed Care – PPO | Admitting: Internal Medicine

## 2016-07-03 VITALS — BP 101/68 | HR 116 | Resp 16 | Wt 130.6 lb

## 2016-07-03 DIAGNOSIS — C7931 Secondary malignant neoplasm of brain: Secondary | ICD-10-CM

## 2016-07-03 DIAGNOSIS — R110 Nausea: Secondary | ICD-10-CM

## 2016-07-03 DIAGNOSIS — D630 Anemia in neoplastic disease: Secondary | ICD-10-CM

## 2016-07-03 DIAGNOSIS — D649 Anemia, unspecified: Secondary | ICD-10-CM

## 2016-07-03 DIAGNOSIS — E43 Unspecified severe protein-calorie malnutrition: Secondary | ICD-10-CM

## 2016-07-03 DIAGNOSIS — C3411 Malignant neoplasm of upper lobe, right bronchus or lung: Secondary | ICD-10-CM

## 2016-07-03 DIAGNOSIS — G893 Neoplasm related pain (acute) (chronic): Secondary | ICD-10-CM

## 2016-07-03 DIAGNOSIS — Z5111 Encounter for antineoplastic chemotherapy: Secondary | ICD-10-CM

## 2016-07-03 DIAGNOSIS — E440 Moderate protein-calorie malnutrition: Secondary | ICD-10-CM

## 2016-07-03 DIAGNOSIS — E860 Dehydration: Secondary | ICD-10-CM

## 2016-07-03 DIAGNOSIS — R531 Weakness: Secondary | ICD-10-CM

## 2016-07-03 DIAGNOSIS — D6481 Anemia due to antineoplastic chemotherapy: Secondary | ICD-10-CM

## 2016-07-03 DIAGNOSIS — C7951 Secondary malignant neoplasm of bone: Secondary | ICD-10-CM

## 2016-07-03 LAB — COMPREHENSIVE METABOLIC PANEL
ALT: 63 U/L — AB (ref 0–55)
ANION GAP: 11 meq/L (ref 3–11)
AST: 54 U/L — ABNORMAL HIGH (ref 5–34)
Albumin: 2.4 g/dL — ABNORMAL LOW (ref 3.5–5.0)
Alkaline Phosphatase: 72 U/L (ref 40–150)
BUN: 30 mg/dL — ABNORMAL HIGH (ref 7.0–26.0)
CO2: 22 meq/L (ref 22–29)
CREATININE: 0.8 mg/dL (ref 0.6–1.1)
Calcium: 9.3 mg/dL (ref 8.4–10.4)
Chloride: 104 mEq/L (ref 98–109)
EGFR: 75 mL/min/{1.73_m2} — ABNORMAL LOW (ref >90)
GLUCOSE: 119 mg/dL (ref 70–140)
Potassium: 4.1 mEq/L (ref 3.5–5.1)
SODIUM: 137 meq/L (ref 136–145)
Total Bilirubin: 0.31 mg/dL (ref 0.20–1.20)
Total Protein: 7.3 g/dL (ref 6.4–8.3)

## 2016-07-03 LAB — CBC WITH DIFFERENTIAL/PLATELET
BASO%: 0.4 % (ref 0.0–2.0)
Basophils Absolute: 0 10*3/uL (ref 0.0–0.1)
EOS%: 1.8 % (ref 0.0–7.0)
Eosinophils Absolute: 0.1 10*3/uL (ref 0.0–0.5)
HCT: 29 % — ABNORMAL LOW (ref 34.8–46.6)
HGB: 9.2 g/dL — ABNORMAL LOW (ref 11.6–15.9)
LYMPH%: 4.6 % — AB (ref 14.0–49.7)
MCH: 29.4 pg (ref 25.1–34.0)
MCHC: 31.8 g/dL (ref 31.5–36.0)
MCV: 92.4 fL (ref 79.5–101.0)
MONO#: 0.8 10*3/uL (ref 0.1–0.9)
MONO%: 9.6 % (ref 0.0–14.0)
NEUT%: 83.6 % — AB (ref 38.4–76.8)
NEUTROS ABS: 6.5 10*3/uL (ref 1.5–6.5)
PLATELETS: 100 10*3/uL — AB (ref 145–400)
RBC: 3.14 10*6/uL — AB (ref 3.70–5.45)
RDW: 19.4 % — ABNORMAL HIGH (ref 11.2–14.5)
WBC: 7.8 10*3/uL (ref 3.9–10.3)
lymph#: 0.4 10*3/uL — ABNORMAL LOW (ref 0.9–3.3)

## 2016-07-03 MED ORDER — SENNOSIDES-DOCUSATE SODIUM 8.6-50 MG PO TABS: 1.0000 | ORAL_TABLET | Freq: Two times a day (BID) | ORAL | 0 refills | Status: DC

## 2016-07-03 MED ORDER — SENNOSIDES-DOCUSATE SODIUM 8.6-50 MG PO TABS: 1.0000 | ORAL_TABLET | Freq: Two times a day (BID) | ORAL | 0 refills | Status: AC

## 2016-07-03 NOTE — Progress Notes (Signed)
[place] Telephone:([phone]) [phone]   Fax:([phone]) [phone]  OFFICE PROGRESS NOTE  [name], MD [address] [place] [address]  DIAGNOSIS: Stage IV (T2a, N3, M1b) Non-small lung cancer, adenocarcinoma with positive EGFR mutation with deletion in exon 19, presented with large right upper lobe lung mass in addition to bilateral mediastinal and left supraclavicular lymphadenopathy as well as multiple bone and brain lesions diagnosed in June 2016  PRIOR THERAPY:  1) Whole brain irradiation under the care of Dr. Lisbeth Renshaw. 2) Gilotrif 40 mg by mouth daily. Status post 5 months of treatment. 3) stereotactic radiotherapy to the enlarging right upper lobe lung mass under the care of Dr. Lisbeth Renshaw. 4) stereotactic radiotherapy to another right upper lobe lung mass as well as solitary brain metastases. 5) Gilotrif 30 mg by mouth daily. First dose started 08/31/2015. Status post 6 months of treatment, discontinued secondary to disease progression. 6) palliative radiotherapy to the progressive lesion in the right side of the chest as well as the metastatic bone lesion involving C6 and C7 vertebrae with epidural tumor in the lateral recess as well as palliative radiotherapy to T8-the in the spine, under the care of Dr. Lisbeth Renshaw completed on 03/04/2016. 7) Systemic chemotherapy with carboplatin for AUC of 5, Alimta 500 MG/M2 and Ketruda (pembrolizumab) 200 MG/M2 every 3 weeks. First dose 03/13/2016. Status post 2 cycles discontinued secondary to intolerance and severe thrombocytopenia.   CURRENT THERAPY:  1) Tagrisso 80 mg by mouth daily started 06/17/2016. 2) Xgeva 120 mg subcutaneously every 4 weeks. First dose 05/01/2015.  INTERVAL HISTORY: Laura Walter 65 y.o. female returns to the clinic today for follow-up visit accompanied by her brother and friend. The patient continues to complain of fatigue and weakness but her symptoms are not worsening before. She also has shortness of  breath with exertion but no significant chest pain, cough or hemoptysis. She denied having any fever or chills. She has no nausea, vomiting or diarrhea. She denied having any skin rash. She is currently on treatment with Tagrisso 80 mg by mouth daily and tolerating it fairly well. She is expected to undergo stereotactic radiotherapy to a solitary brain lesion soon under the care of Dr. Lisbeth Renshaw. She is here today for evaluation with repeat blood work.  MEDICAL HISTORY: Past Medical History:  Diagnosis Date  . Allergy    seasonal  . Bone cancer ([facility]) 01/10/15 PET   lytic lesion lt 1st rib,T9,L3 Lt acetabulum  . Brain cancer ([facility]) 01/13/15   MRI multiple small brain mets 10 individual lesions  . Bronchitis   . H/O reactive hypoglycemia   . IBS (irritable bowel syndrome)   . Lung cancer ([facility])    stageIV nscca rul  . Osteopenia   . Paronychia 11/17/2015  . Pneumonia 2009 & 2011  . S/P radiation therapy completed 02/08/15   whole brain ,spine  . S/P radiation therapy completed 11/27/15   lung    ALLERGIES:  is allergic to sulfonamide derivatives.  MEDICATIONS:  Current Outpatient Prescriptions  Medication Sig Dispense Refill  . acetaminophen (TYLENOL) 325 MG tablet Take 650 mg by mouth every 6 (six) hours as needed for mild pain, moderate pain, fever or headache.     . Amino Acids-Protein Hydrolys (FEEDING SUPPLEMENT, PRO-STAT SUGAR FREE 64,) LIQD Take 30 mLs by mouth 2 (two) times daily. 900 mL 0  . FeFum-FePoly-FA-B Cmp-C-Biot (INTEGRA PLUS) CAPS Take 1 capsule by mouth every morning. 90 capsule 0  .  HYDROcodone-acetaminophen (NORCO) 5-325 MG tablet Take 1-2 tablets by mouth every 4 (four) hours as needed for moderate pain. (Patient not taking: Reported on 06/19/2016) 60 tablet 0  . levETIRAcetam (KEPPRA) 500 MG tablet 1 tablet in the morning and 1.5 tablets in the evening 75 tablet 11  . Menthol, Topical Analgesic, (BIOFREEZE) 4 % GEL Apply 1 application topically 4 (four) times daily as  needed (pain). Apply to trapezius     . Nutritional Supplements (FEEDING SUPPLEMENT, OSMOLITE 1.5 CAL,) LIQD Place 237 mLs into feeding tube 4 (four) times daily. (Patient taking differently: Place 237 mLs into feeding tube 3 (three) times daily. ) 958 mL 12  . OLANZapine (ZYPREXA) 10 MG tablet TAKE 1 TABLET (10 MG TOTAL) BY MOUTH AT BEDTIME. (INS PAYS 12/13/2015) 30 tablet 0  . osimertinib mesylate (TAGRISSO) 80 MG tablet Take 1 tablet (80 mg total) by mouth daily. 30 tablet 2  . polyethylene glycol (MIRALAX / GLYCOLAX) packet Take 17 g by mouth daily as needed. (Patient taking differently: Take 17 g by mouth daily as needed for mild constipation or moderate constipation. ) 14 each 0  . senna-docusate (SENOKOT-S) 8.6-50 MG tablet Take 1 tablet by mouth 2 (two) times daily. 30 tablet 0  . traMADol (ULTRAM) 50 MG tablet Take 1 tablet (50 mg total) by mouth every 6 (six) hours as needed. (Patient not taking: Reported on 06/19/2016) 60 tablet 0   No current facility-administered medications for this visit.     SURGICAL HISTORY:  Past Surgical History:  Procedure Laterality Date  . COLONOSCOPY  2009   Negative; [facility]  . ESOPHAGOGASTRODUODENOSCOPY (EGD) WITH PROPOFOL N/A 04/11/2015   Procedure: ESOPHAGOGASTRODUODENOSCOPY (EGD) WITH PROPOFOL;  Surgeon: Carol Ada, MD;  Location: [facility];  Service: Endoscopy;  Laterality: N/A;  . PEG PLACEMENT N/A 04/11/2015   Procedure: PERCUTANEOUS ENDOSCOPIC GASTROSTOMY (PEG) PLACEMENT;  Surgeon: Carol Ada, MD;  Location: [facility];  Service: Endoscopy;  Laterality: N/A;  . TONSILLECTOMY AND ADENOIDECTOMY    . WISDOM TOOTH EXTRACTION      REVIEW OF SYSTEMS:  A comprehensive review of systems was negative except for: Constitutional: positive for anorexia and fatigue Respiratory: positive for dyspnea on exertion Musculoskeletal: positive for arthralgias and muscle weakness   PHYSICAL EXAMINATION: General appearance: alert, cooperative, fatigued  and no distress Head: Normocephalic, without obvious abnormality, Several areas of bruises and ecchymosis on the right side of the face Neck: no adenopathy, no JVD, supple, symmetrical, trachea midline and thyroid not enlarged, symmetric, no tenderness/mass/nodules Lymph nodes: Cervical, supraclavicular, and axillary nodes normal. Resp: clear to auscultation bilaterally Back: Tenderness to palpation at the lower thoracic and lumbar vertebrae. Cardio: regular rate and rhythm, S1, S2 normal, no murmur, click, rub or gallop GI: soft, non-tender; bowel sounds normal; no masses,  no organomegaly Extremities: extremities normal, atraumatic, no cyanosis or edema Neurologic: Alert and oriented X 3, normal strength and tone. Normal symmetric reflexes. Normal coordination and gait    ECOG PERFORMANCE STATUS: 2 - Symptomatic, <50% confined to bed  Blood pressure 101/68, pulse (!) 116, resp. rate 16, weight 130 lb 9.6 oz (59.2 kg), SpO2 100 %.  LABORATORY DATA: Lab Results  Component Value Date   WBC 7.8 07/03/2016   HGB 9.2 (L) 07/03/2016   HCT 29.0 (L) 07/03/2016   MCV 92.4 07/03/2016   PLT 100 (L) 07/03/2016      Chemistry      Component Value Date/Time   NA 137 07/03/2016 1138   K 4.1 07/03/2016 1138  CL 107 06/10/2016 1342   CO2 22 07/03/2016 1138   BUN 30.0 (H) 07/03/2016 1138   CREATININE 0.8 07/03/2016 1138      Component Value Date/Time   CALCIUM 9.3 07/03/2016 1138   ALKPHOS 72 07/03/2016 1138   AST 54 (H) 07/03/2016 1138   ALT 63 (H) 07/03/2016 1138   BILITOT 0.31 07/03/2016 1138       RADIOGRAPHIC STUDIES: Dg Chest 2 View  Result Date: 06/09/2016 CLINICAL DATA:  Weakness for 3 days. History of lung, bone and brain cancer EXAM: CHEST  2 VIEW COMPARISON:  05/14/2016 CXR, 05/11/2016 chest CT FINDINGS: Slight increase in moderate right effusion spanning at least 1.5 vertebral body heights. Apical mass in the right upper lobe is unchanged. No overt pulmonary edema. The  heart is top-normal in size. No aortic aneurysm. Known osteoblastic appearing lesions of T9, L2 and L3 remain stable. IMPRESSION: Slight increase in right pleural effusion since the interim. Stable right apical masslike opacity. Osteoblastic disease involving T9, L2 and L3 remain grossly unchanged. Electronically Signed   By: Ashley Royalty M.D.   On: 06/09/2016 22:41   Mr Jeri Cos WE Contrast  Result Date: 06/30/2016 CLINICAL DATA:  Lung cancer.  Sixteen month post SRS restaging. EXAM: MRI HEAD WITHOUT AND WITH CONTRAST TECHNIQUE: Multiplanar, multiecho pulse sequences of the brain and surrounding structures were obtained without and with intravenous contrast. CONTRAST:  38m MULTIHANCE GADOBENATE DIMEGLUMINE 529 MG/ML IV SOLN COMPARISON:  None. FINDINGS: Brain: Diffuse post radiation changes are again seen throughout the white matter. No acute infarct or hemorrhage is present. A remote punctate infarct is again seen within the left cerebellum. Metastatic lesions are annotated on series 10 Previously noted lesion along the medial right cerebellum has had decreased in size (image 36). A 5 mm marrow posterior lateral right cerebellar lesion has also decreased in size (image 33). The lesion in the high anterior left frontal lobe is stable to slightly increased in size, measuring 6 x 3 x 5 mm (image 128). A lesion near the vertex just posterior to the central sulcus has increased in size, now measuring 3 mm (image 141). A punctate area of enhancement in the anterior left frontal lobe may be vascular (image 136). A punctate focus of enhancement more inferiorly in the right frontal lobe is only seen on the axial images. This may be a metastatic lesion. There is no significant interval change. (Image 104). Vascular: Flow is present in the major intracranial arteries. Skull and upper cervical spine: The skullbase is within normal limits. Midline sagittal structures are unremarkable. Mild degenerative changes are noted in  the upper cervical spine. Sinuses/Orbits: The paranasal sinuses and mastoid air cells are clear. IMPRESSION: 1. Stable slight increase in size of 6 mm lesion the high anterior left frontal lobe. 2. High left parietal lesion just posterior to the central sulcus is new, measuring 3 mm. 3. Other measured lesions are stable to slightly decreased in size. 4. Stable diffuse white matter changes compatible with radiation therapy. Electronically Signed   By: CSan MorelleM.D.   On: 06/30/2016 19:20   Mr BJeri CosWRXContrast  Result Date: 06/11/2016 CLINICAL DATA:  Lung cancer with brain metastases. Generalized weakness and fever. Bilateral lower extremity weakness. EXAM: MRI HEAD WITHOUT AND WITH CONTRAST TECHNIQUE: Multiplanar, multiecho pulse sequences of the brain and surrounding structures were obtained without and with intravenous contrast. CONTRAST:  15 mL MultiHance COMPARISON:  05/12/2016 FINDINGS: Brain: There is no evidence of acute infarct, intracranial  hemorrhage, midline shift, or extra-axial fluid collection. Mild cerebral atrophy is unchanged. Confluent T2 hyperintensities in the cerebral white matter bilaterally are stable and compatible with postradiation changes. 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 A 3 mm enhancing lesion in the inferior cerebellar vermis on the right has slightly enlarged (series 12, image 12, previously 2 mm). There is a 6 mm ring-enhancing lesion more laterally in the inferior right cerebellum, not clearly present on the prior study and with minimal surrounding edema (series 12, image 10). 5 mm lesion in the posterior left frontal lobe at the vertex has not significantly changed (series 12, image 45). A punctate 2 mm focus of cortical enhancement slightly more superiorly and anteriorly in the left frontal lobe at the vertex is in retrospect unchanged from 05/12/2016 but new from 03/10/2016 (series 12, image 48). Punctate 2 mm enhancing cortical lesion versus  vessel more inferiorly in the left frontal lobe is unchanged (series 12, image 38). Vascular: Major intracranial vascular flow voids are preserved. Skull and upper cervical spine: Unremarkable bone marrow signal. Sinuses/Orbits: Unremarkable orbits. Minimal right maxillary sinus mucosal thickening and small right mastoid effusion. Other: None. IMPRESSION: 1. New 6 mm right cerebellar metastasis.  Trace edema. 2. Slightly increased size of 3 mm right cerebellar metastasis. 3. Unchanged small left frontal lobe metastases. 4. No acute infarct. Electronically Signed   By: Logan Bores M.D.   On: 06/11/2016 19:10    ASSESSMENT AND PLAN: This is a very pleasant 65 years old never smoker white female recently diagnosed with stage IV non-small cell lung cancer, adenocarcinoma with positive EGFR mutation with deletion in exon 19 presented with large right upper lobe lung mass in addition to bilateral mediastinal, supraclavicular lymphadenopathy as well as bone and multiple brain metastasis diagnosed in June 2016.  The patient completed whole brain irradiation under the care of Dr. Lisbeth Renshaw. She was started on treatment with Gilotrif 40 mg by mouth daily status post 5 months but this was discontinued secondary to intolerance with significant bony contact, skin rash in the sacral area as well as few episodes of diarrhea  The patient was started on Gilotrif 30 mg by mouth daily since 08/31/2015. She is tolerating it much better with the reduced dose was less adverse effects. Unfortunately the recent CT scan of the chest, abdomen and pelvis showed interval increase in the size of the right upper lobe lung mass compatible with disease progression. She is currently undergoing stereotactic radiotherapy to the enlarging right upper lobe lung mass. Molecular studies from blood test as well as CT-guided core biopsy of the enlarging right upper lobe lung mass showed no evidence for T790M resistant mutation. She is tolerating her  treatment with Gilotrif fairly well. Unfortunately the recent CT scan of the chest, abdomen and pelvis showed mixed response with new pleural based nodule adjacent to the previously treated right upper lobe mass. She underwent palliative radiotherapy to the new pleural-based nodule under the care of Dr. Lisbeth Renshaw. She is also undergoing stereotactic radiotherapy to new solitary brain metastasis. She also underwent palliative radiotherapy to the lesion in the cervical spines.  I had a lengthy discussion with the patient and her family today about her current disease status and treatment options. I recommended for the patient to discontinue her treatment with Gilotrif at this point. She was given the option of palliative care versus proceeding with systemic chemotherapy with carboplatin for AUC of 5, Alimta 500 MG/M2 and Ketruda 200 MG IV every 3 weeks.  She was treated with 2 cycles of systemic chemotherapy with carboplatin, Alimta and Ketruda. She had that time of this treatment and had significant thrombocytopenia. Repeat imaging studies showed stable disease but the patient could not tolerate the treatment. She has been off treatment for several weeks now. The patient is currently undergoing treatment with prednisone 80 mg by mouth daily and tolerated the first 2 weeks of her treatment fairly well. I recommended for her to continue this treatment as scheduled. For the metastatic bone disease, I continue the patient on Xgeva 120 mg subcutaneously on monthly basis. The patient was also advised to increased her intake of calcium supplement and vitamin D. For the nausea, she will continue on Zyprexa 10 mg by mouth daily at bedtime.  For pain management, she would continue on tramadol 50 mg by mouth every 6 hours as needed for pain. For the anemia, she will continue on Integra +1 capsule by mouth daily. The patient would come back for follow-up visit in 2 weeks for evaluation and repeat blood work.  For the  malnutrition, I will refer the patient to the dietitian at the [facility] for reevaluation of her nutritional intake. She was advised to call immediately if she has any concerning symptoms in the interval. The patient voices understanding of current disease status and treatment options and is in agreement with the current care plan.  All questions were answered. The patient knows to call the clinic with any problems, questions or concerns. We can certainly see the patient much sooner if necessary. I spent 15 minutes counseling the patient face to face. The total time spent in the appointment was 25 minutes. Disclaimer: This note was dictated with voice recognition software. Similar sounding words can inadvertently be transcribed and may not be corrected upon review.  Paronychia

## 2016-07-04 ENCOUNTER — Telehealth: Payer: Self-pay | Admitting: Internal Medicine

## 2016-07-04 ENCOUNTER — Ambulatory Visit: Payer: BC Managed Care – PPO | Admitting: Radiation Oncology

## 2016-07-04 DIAGNOSIS — C3411 Malignant neoplasm of upper lobe, right bronchus or lung: Secondary | ICD-10-CM | POA: Diagnosis not present

## 2016-07-04 NOTE — Telephone Encounter (Signed)
Not able to reach patient by phone or leave message re 12/11 appointments. Appointments for 12/6 cxd. Schedule mailed.

## 2016-07-09 ENCOUNTER — Encounter: Payer: Self-pay | Admitting: Radiation Oncology

## 2016-07-09 ENCOUNTER — Ambulatory Visit
Admission: RE | Admit: 2016-07-09 | Discharge: 2016-07-09 | Disposition: A | Discharge status: Home / Self Care | Payer: BC Managed Care – PPO | Source: Ambulatory Visit | Attending: Radiation Oncology | Admitting: Radiation Oncology

## 2016-07-09 ENCOUNTER — Telehealth: Payer: Self-pay | Admitting: Internal Medicine

## 2016-07-09 VITALS — BP 101/68 | HR 118 | Temp 97.6°F | Resp 18

## 2016-07-09 DIAGNOSIS — C3411 Malignant neoplasm of upper lobe, right bronchus or lung: Secondary | ICD-10-CM | POA: Diagnosis not present

## 2016-07-09 DIAGNOSIS — C7931 Secondary malignant neoplasm of brain: Secondary | ICD-10-CM

## 2016-07-09 NOTE — Telephone Encounter (Signed)
Appointments time was changed, per patient's daughter (bonnie). A copy of the schedule was given to the patient. 07/09/16

## 2016-07-09 NOTE — Op Note (Signed)
  Name: Laura Walter  MRN: [id number]  Date: 07/09/2016   DOB: [birth year]  Stereotactic Radiosurgery Operative Note  PRE-OPERATIVE DIAGNOSIS:  Multiple Brain Metastases  POST-OPERATIVE DIAGNOSIS:  Multiple Brain Metastases  PROCEDURE:  Stereotactic Radiosurgery  SURGEON:  Charlie Pitter, MD  NARRATIVE: The patient underwent a radiation treatment planning session in the radiation oncology simulation suite under the care of the radiation oncology physician and physicist.  I participated closely in the radiation treatment planning afterwards. The patient underwent planning CT which was fused to 3T high resolution MRI with 1 mm axial slices.  These images were fused on the planning system.  We contoured the gross target volumes and subsequently expanded this to yield the Planning Target Volume. I actively participated in the planning process.  I helped to define and review the target contours and also the contours of the optic pathway, eyes, brainstem and selected nearby organs at risk.  All the dose constraints for critical structures were reviewed and compared to AAPM Task Group 101.  The prescription dose conformity was reviewed.  I approved the plan electronically.    Accordingly, Laura Walter was brought to the TrueBeam stereotactic radiation treatment linac and placed in the custom immobilization mask.  The patient was aligned according to the IR fiducial markers with BrainLab Exactrac, then orthogonal x-rays were used in ExacTrac with the 6DOF robotic table and the shifts were made to align the patient  Laura Walter received stereotactic radiosurgery uneventfully.    Lesions treated:  2   Complex lesions treated:  0 (>3.5 cm, <8m of optic path, or within the brainstem)   The detailed description of the procedure is recorded in the radiation oncology procedure note.  I was present for the duration of the procedure.  DISPOSITION:  Following delivery, the patient was transported to nursing in  stable condition and monitored for possible acute effects to be discharged to home in stable condition with follow-up in one month.  PCharlie Pitter MD 07/09/2016 1:09 PM

## 2016-07-09 NOTE — Progress Notes (Signed)
S/p SRS Brain, no pain, no nausea, no vision changes,no blurred vision, patient in w/c, head down, voiced no real c/o's, gave another warm blanket and wrapped around patient, offered fluids, patien declined, monitor 30 minutes, not taking decadron, can go home with froend [name] ,patient doesn't drive, will rest and try to eat something when home, knows to call for any unusual symptems if occurs, high fever, inractable vomiting, increased head ache 1:47 PM

## 2016-07-10 ENCOUNTER — Ambulatory Visit: Payer: BC Managed Care – PPO | Admitting: Internal Medicine

## 2016-07-10 ENCOUNTER — Ambulatory Visit: Payer: BC Managed Care – PPO

## 2016-07-10 ENCOUNTER — Other Ambulatory Visit: Payer: BC Managed Care – PPO

## 2016-07-11 ENCOUNTER — Telehealth: Payer: Self-pay | Admitting: Medical Oncology

## 2016-07-11 NOTE — Progress Notes (Signed)
°  Radiation Oncology         [phone] ________________________________  Name: Laura Walter MRN: [id number]  Date: 07/09/2016  DOB: Apr 03, 1951  End of Treatment Note  Diagnosis:   Stage IV, T2a, N3, M1b NSCLC of the right upper lobe with disease in the thoracic spine and brain with progressive brain metastases     Indication for treatment:  Palliative  Radiation treatment dates:   07/09/16  Site/dose:    1) PTV2 left frontal 3 mm: 20 Gy in 1 fraction 2) PTV3 right lateral cerebellum 6 mm: 20 Gy in 1 fraction 3) PTV4 right medial cerebellum 3 mm: 20 Gy in 1 fraction  Beams/energy:   SBRT/SRT-3D // 6FFF Photon  Narrative: The patient tolerated radiation treatment relatively well. The patient denies pain, nausea, or vision changes.  Plan: The patient has completed radiation treatment. The patient will return to radiation oncology clinic for routine followup in one month. I advised them to call or return sooner if they have any questions or concerns related to their recovery or treatment.  ------------------------------------------------  Jodelle Gross, MD, PhD  This document serves as a record of services personally performed by Kyung Rudd, MD. It was created on his behalf by Darcus Austin, a trained medical scribe. The creation of this record is based on the scribe's personal observations and the provider's statements to them. This document has been checked and approved by the attending provider.

## 2016-07-11 NOTE — Telephone Encounter (Signed)
Can she  continue Pt 2 x week for 3 weeks. [place] for PT order.

## 2016-07-15 ENCOUNTER — Other Ambulatory Visit (HOSPITAL_BASED_OUTPATIENT_CLINIC_OR_DEPARTMENT_OTHER): Payer: BC Managed Care – PPO

## 2016-07-15 ENCOUNTER — Ambulatory Visit (HOSPITAL_BASED_OUTPATIENT_CLINIC_OR_DEPARTMENT_OTHER): Payer: BC Managed Care – PPO | Admitting: Oncology

## 2016-07-15 ENCOUNTER — Ambulatory Visit: Payer: BC Managed Care – PPO

## 2016-07-15 ENCOUNTER — Other Ambulatory Visit: Payer: Self-pay | Admitting: *Deleted

## 2016-07-15 ENCOUNTER — Ambulatory Visit: Payer: BC Managed Care – PPO | Admitting: Oncology

## 2016-07-15 ENCOUNTER — Ambulatory Visit (INDEPENDENT_AMBULATORY_CARE_PROVIDER_SITE_OTHER): Payer: BC Managed Care – PPO | Admitting: Neurology

## 2016-07-15 ENCOUNTER — Encounter: Payer: Self-pay | Admitting: Oncology

## 2016-07-15 ENCOUNTER — Ambulatory Visit (HOSPITAL_COMMUNITY)
Admission: RE | Admit: 2016-07-15 | Discharge: 2016-07-15 | Disposition: A | Discharge status: Home / Self Care | Payer: BC Managed Care – PPO | Source: Ambulatory Visit | Attending: Internal Medicine | Admitting: Internal Medicine

## 2016-07-15 ENCOUNTER — Encounter: Payer: Self-pay | Admitting: Neurology

## 2016-07-15 ENCOUNTER — Ambulatory Visit: Payer: BC Managed Care – PPO | Admitting: Nutrition

## 2016-07-15 VITALS — BP 98/66 | HR 115 | Ht 67.0 in | Wt 130.0 lb

## 2016-07-15 VITALS — BP 104/69 | HR 113 | Resp 16 | Ht 67.0 in

## 2016-07-15 DIAGNOSIS — Z5111 Encounter for antineoplastic chemotherapy: Secondary | ICD-10-CM

## 2016-07-15 DIAGNOSIS — C7931 Secondary malignant neoplasm of brain: Secondary | ICD-10-CM | POA: Diagnosis not present

## 2016-07-15 DIAGNOSIS — C3490 Malignant neoplasm of unspecified part of unspecified bronchus or lung: Secondary | ICD-10-CM

## 2016-07-15 DIAGNOSIS — C7951 Secondary malignant neoplasm of bone: Secondary | ICD-10-CM

## 2016-07-15 DIAGNOSIS — D630 Anemia in neoplastic disease: Secondary | ICD-10-CM

## 2016-07-15 DIAGNOSIS — R110 Nausea: Secondary | ICD-10-CM | POA: Diagnosis not present

## 2016-07-15 DIAGNOSIS — C3411 Malignant neoplasm of upper lobe, right bronchus or lung: Secondary | ICD-10-CM

## 2016-07-15 DIAGNOSIS — E440 Moderate protein-calorie malnutrition: Secondary | ICD-10-CM

## 2016-07-15 DIAGNOSIS — G893 Neoplasm related pain (acute) (chronic): Secondary | ICD-10-CM

## 2016-07-15 DIAGNOSIS — R530 Neoplastic (malignant) related fatigue: Secondary | ICD-10-CM

## 2016-07-15 DIAGNOSIS — D649 Anemia, unspecified: Secondary | ICD-10-CM | POA: Diagnosis present

## 2016-07-15 LAB — CBC WITH DIFFERENTIAL/PLATELET
BASO%: 0.3 % (ref 0.0–2.0)
Basophils Absolute: 0 10*3/uL (ref 0.0–0.1)
EOS%: 1.2 % (ref 0.0–7.0)
Eosinophils Absolute: 0.1 10*3/uL (ref 0.0–0.5)
HEMATOCRIT: 25.1 % — AB (ref 34.8–46.6)
HEMOGLOBIN: 7.9 g/dL — AB (ref 11.6–15.9)
LYMPH#: 0.4 10*3/uL — AB (ref 0.9–3.3)
LYMPH%: 4.6 % — ABNORMAL LOW (ref 14.0–49.7)
MCH: 29.1 pg (ref 25.1–34.0)
MCHC: 31.4 g/dL — ABNORMAL LOW (ref 31.5–36.0)
MCV: 92.7 fL (ref 79.5–101.0)
MONO#: 0.8 10*3/uL (ref 0.1–0.9)
MONO%: 9.6 % (ref 0.0–14.0)
NEUT#: 6.9 10*3/uL — ABNORMAL HIGH (ref 1.5–6.5)
NEUT%: 84.3 % — ABNORMAL HIGH (ref 38.4–76.8)
Platelets: 74 10*3/uL — ABNORMAL LOW (ref 145–400)
RBC: 2.71 10*6/uL — ABNORMAL LOW (ref 3.70–5.45)
RDW: 20 % — AB (ref 11.2–14.5)
WBC: 8.2 10*3/uL (ref 3.9–10.3)

## 2016-07-15 LAB — COMPREHENSIVE METABOLIC PANEL
ALBUMIN: 2.2 g/dL — AB (ref 3.5–5.0)
ALK PHOS: 74 U/L (ref 40–150)
ALT: 94 U/L — AB (ref 0–55)
AST: 64 U/L — AB (ref 5–34)
Anion Gap: 12 mEq/L — ABNORMAL HIGH (ref 3–11)
BUN: 49.2 mg/dL — AB (ref 7.0–26.0)
CALCIUM: 9 mg/dL (ref 8.4–10.4)
CHLORIDE: 105 meq/L (ref 98–109)
CO2: 22 mEq/L (ref 22–29)
CREATININE: 0.9 mg/dL (ref 0.6–1.1)
EGFR: 67 mL/min/{1.73_m2} — ABNORMAL LOW (ref >90)
Glucose: 133 mg/dl (ref 70–140)
Potassium: 4.4 mEq/L (ref 3.5–5.1)
Sodium: 138 mEq/L (ref 136–145)
TOTAL PROTEIN: 7 g/dL (ref 6.4–8.3)
Total Bilirubin: 0.29 mg/dL (ref 0.20–1.20)

## 2016-07-15 LAB — PREPARE RBC (CROSSMATCH)

## 2016-07-15 NOTE — Progress Notes (Signed)
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 Lisbeth Renshaw. 2) Gilotrif 40 mg by mouth daily. Status post 5 months of treatment. 3) stereotactic radiotherapy to the enlarging right upper lobe lung mass under the care of Dr. Lisbeth Renshaw. 4) stereotactic radiotherapy to another right upper lobe lung mass as well as solitary brain metastases. 5) Gilotrif 30 mg by mouth daily. First dose started 08/31/2015. Status post 6 months of treatment, discontinued secondary to disease progression. 6) palliative radiotherapy to the progressive lesion in the right side of the chest as well as the metastatic bone lesion involving C6 and C7 vertebrae with epidural tumor in the lateral recess as well as palliative radiotherapy to T8-the in the spine, under the care of Dr. Lisbeth Renshaw completed on 03/04/2016. 7) Systemic chemotherapy with carboplatin for AUC of 5, Alimta 500 MG/M2 and Ketruda (pembrolizumab) 200 MG/M2 every 3 weeks. First dose 03/13/2016. Status post 2 cycles discontinued secondary to intolerance and severe thrombocytopenia.   CURRENT THERAPY:  1) Tagrisso 80 mg by mouth daily started 06/17/2016. 2) Xgeva 120 mg subcutaneously every 4 weeks. First dose 05/01/2015.  INTERVAL HISTORY: MALAN WERK 65 y.o. female returns to the clinic today for follow-up visit accompanied by her brother and friend. The patient continues to complain of fatigue and weakness but her symptoms are not worsening before. She has increased  shortness of breath with exertion but no significant chest pain, cough or hemoptysis. She denied having any fever or chills. She has no nausea, vomiting or diarrhea. She denied having any skin rash. She is currently on treatment with Tagrisso 80 mg by mouth daily and tolerating it fairly well. Friend reports open area to her coccyx. Putting cream and barrier foam on it. There is some improvement, but they would like this looked at today.  She is here today for evaluation with repeat blood work.  MEDICAL HISTORY: Past Medical History:  Diagnosis Date  . Allergy    seasonal  . Bone cancer ([place]) 01/10/15 PET   lytic lesion lt 1st rib,T9,L3 Lt acetabulum  . Brain cancer ([facility]) 01/13/15   MRI multiple small brain mets 10 individual lesions  . Bronchitis   . H/O reactive hypoglycemia   . IBS (irritable bowel syndrome)   . Lung cancer ([facility])    stageIV nscca rul  . Osteopenia   . Paronychia 11/17/2015  . Pneumonia 2009 & 2011  . S/P radiation therapy completed 02/08/15   whole brain ,spine  . S/P radiation therapy completed 11/27/15   lung    ALLERGIES:  is allergic to sulfonamide derivatives.  MEDICATIONS:  Current Outpatient Prescriptions  Medication Sig Dispense Refill  . Amino Acids-Protein Hydrolys (FEEDING SUPPLEMENT, PRO-STAT SUGAR FREE 64,) LIQD Take 30 mLs by mouth 2 (two) times daily. 900 mL 0  . FeFum-FePoly-FA-B Cmp-C-Biot (INTEGRA PLUS) CAPS Take 1 capsule by mouth every morning. 90 capsule 0  . folic acid (FOLVITE) 1 MG tablet Take 1 mg by mouth daily.    Marland Kitchen HYDROcodone-acetaminophen (NORCO) 5-325 MG tablet  Take 1-2 tablets by mouth every 4 (four) hours as needed for moderate pain. 60 tablet 0  . levETIRAcetam (KEPPRA) 500 MG tablet 1 tablet in the morning and 1.5 tablets in the evening 75 tablet 11  . Nutritional Supplements (FEEDING SUPPLEMENT, OSMOLITE 1.5 CAL,) LIQD Place 237 mLs into feeding tube 4 (four) times daily. (Patient taking differently: Place 237 mLs into feeding tube 3  (three) times daily. ) 958 mL 12  . OLANZapine (ZYPREXA) 10 MG tablet TAKE 1 TABLET (10 MG TOTAL) BY MOUTH AT BEDTIME. (INS PAYS 12/13/2015) 30 tablet 0  . osimertinib mesylate (TAGRISSO) 80 MG tablet Take 1 tablet (80 mg total) by mouth daily. 30 tablet 2  . polyethylene glycol (MIRALAX / GLYCOLAX) packet Take 17 g by mouth daily as needed. (Patient taking differently: Take 17 g by mouth daily as needed for mild constipation or moderate constipation. ) 14 each 0  . senna-docusate (SENOKOT-S) 8.6-50 MG tablet Take 1 tablet by mouth 2 (two) times daily. 30 tablet 0  . traMADol (ULTRAM) 50 MG tablet Take 1 tablet (50 mg total) by mouth every 6 (six) hours as needed. 60 tablet 0   No current facility-administered medications for this visit.     SURGICAL HISTORY:  Past Surgical History:  Procedure Laterality Date  . COLONOSCOPY  2009   Negative; [facility]  . ESOPHAGOGASTRODUODENOSCOPY (EGD) WITH PROPOFOL N/A 04/11/2015   Procedure: ESOPHAGOGASTRODUODENOSCOPY (EGD) WITH PROPOFOL;  Surgeon: Carol Ada, MD;  Location: [facility];  Service: Endoscopy;  Laterality: N/A;  . PEG PLACEMENT N/A 04/11/2015   Procedure: PERCUTANEOUS ENDOSCOPIC GASTROSTOMY (PEG) PLACEMENT;  Surgeon: Carol Ada, MD;  Location: [facility];  Service: Endoscopy;  Laterality: N/A;  . TONSILLECTOMY AND ADENOIDECTOMY    . WISDOM TOOTH EXTRACTION      REVIEW OF SYSTEMS:  A comprehensive review of systems was negative except for: Constitutional: positive for anorexia and fatigue Respiratory: positive for dyspnea on exertion Musculoskeletal: positive for arthralgias and muscle weakness   PHYSICAL EXAMINATION: General appearance: alert, cooperative, fatigued and no distress Head: Normocephalic, without obvious abnormality, Several areas of bruises and ecchymosis on the right side of the face Neck: no adenopathy, no JVD, supple, symmetrical, trachea midline and thyroid not enlarged, symmetric, no tenderness/mass/nodules Lymph  nodes: Cervical, supraclavicular, and axillary nodes normal. Resp: clear to auscultation bilaterally Back: Tenderness to palpation at the lower thoracic and lumbar vertebrae. Cardio: regular rate and rhythm, S1, S2 normal, no murmur, click, rub or gallop GI: soft, non-tender; bowel sounds normal; no masses,  no organomegaly Extremities: extremities normal, atraumatic, no cyanosis or edema Neurologic: Alert and oriented X 3, normal strength and tone. Normal symmetric reflexes. Normal coordination and gait    ECOG PERFORMANCE STATUS: 2 - Symptomatic, <50% confined to bed  Blood pressure 104/69, pulse (!) 113, resp. rate 16, height 5' 7"  (1.702 m), SpO2 100 %.  LABORATORY DATA: Lab Results  Component Value Date   WBC 8.2 07/15/2016   HGB 7.9 (L) 07/15/2016   HCT 25.1 (L) 07/15/2016   MCV 92.7 07/15/2016   PLT 74 (L) 07/15/2016      Chemistry      Component Value Date/Time   NA 138 07/15/2016 1405   K 4.4 07/15/2016 1405   CL 107 06/10/2016 1342   CO2 22 07/15/2016 1405   BUN 49.2 (H) 07/15/2016 1405   CREATININE 0.9 07/15/2016 1405      Component Value Date/Time   CALCIUM 9.0 07/15/2016 1405   ALKPHOS 74 07/15/2016 1405  AST 64 (H) 07/15/2016 1405   ALT 94 (H) 07/15/2016 1405   BILITOT 0.29 07/15/2016 1405       RADIOGRAPHIC STUDIES: Mr Jeri Cos QV Contrast  Result Date: 06/30/2016 CLINICAL DATA:  Lung cancer.  Sixteen month post SRS restaging. EXAM: MRI HEAD WITHOUT AND WITH CONTRAST TECHNIQUE: Multiplanar, multiecho pulse sequences of the brain and surrounding structures were obtained without and with intravenous contrast. CONTRAST:  75m MULTIHANCE GADOBENATE DIMEGLUMINE 529 MG/ML IV SOLN COMPARISON:  None. FINDINGS: Brain: Diffuse post radiation changes are again seen throughout the white matter. No acute infarct or hemorrhage is present. A remote punctate infarct is again seen within the left cerebellum. Metastatic lesions are annotated on series 10 Previously noted  lesion along the medial right cerebellum has had decreased in size (image 36). A 5 mm marrow posterior lateral right cerebellar lesion has also decreased in size (image 33). The lesion in the high anterior left frontal lobe is stable to slightly increased in size, measuring 6 x 3 x 5 mm (image 128). A lesion near the vertex just posterior to the central sulcus has increased in size, now measuring 3 mm (image 141). A punctate area of enhancement in the anterior left frontal lobe may be vascular (image 136). A punctate focus of enhancement more inferiorly in the right frontal lobe is only seen on the axial images. This may be a metastatic lesion. There is no significant interval change. (Image 104). Vascular: Flow is present in the major intracranial arteries. Skull and upper cervical spine: The skullbase is within normal limits. Midline sagittal structures are unremarkable. Mild degenerative changes are noted in the upper cervical spine. Sinuses/Orbits: The paranasal sinuses and mastoid air cells are clear. IMPRESSION: 1. Stable slight increase in size of 6 mm lesion the high anterior left frontal lobe. 2. High left parietal lesion just posterior to the central sulcus is new, measuring 3 mm. 3. Other measured lesions are stable to slightly decreased in size. 4. Stable diffuse white matter changes compatible with radiation therapy. Electronically Signed   By: CSan MorelleM.D.   On: 06/30/2016 19:20    ASSESSMENT AND PLAN: This is a very pleasant 65year old never smoker white female recently diagnosed with stage IV non-small cell lung cancer, adenocarcinoma with positive EGFR mutation with deletion in exon 19 presented with large right upper lobe lung mass in addition to bilateral mediastinal, supraclavicular lymphadenopathy as well as bone and multiple brain metastasis diagnosed in June 2016.  The patient completed whole brain irradiation under the care of Dr. MLisbeth Renshaw She was started on treatment with  Gilotrif 40 mg by mouth daily status post 5 months but this was discontinued secondary to intolerance with significant bony contact, skin rash in the sacral area as well as few episodes of diarrhea  The patient was started on Gilotrif 30 mg by mouth daily since 08/31/2015. She is tolerating it much better with the reduced dose was less adverse effects. Unfortunately the recent CT scan of the chest, abdomen and pelvis showed interval increase in the size of the right upper lobe lung mass compatible with disease progression. She is currently undergoing stereotactic radiotherapy to the enlarging right upper lobe lung mass. Molecular studies from blood test as well as CT-guided core biopsy of the enlarging right upper lobe lung mass showed no evidence for T790M resistant mutation. She is tolerating her treatment with Gilotrif fairly well. Unfortunately the recent CT scan of the chest, abdomen and pelvis showed mixed response with  new pleural based nodule adjacent to the previously treated right upper lobe mass. She underwent palliative radiotherapy to the new pleural-based nodule under the care of Dr. Lisbeth Renshaw. She is also underwent stereotactic radiotherapy to new solitary brain metastasis. She also underwent palliative radiotherapy to the lesion in the cervical spines.  I had a lengthy discussion with the patient and her family today about her current disease status and treatment options. I recommended for the patient to discontinue her treatment with Gilotrif at this point. She was given the option of palliative care versus proceeding with systemic chemotherapy with carboplatin for AUC of 5, Alimta 500 MG/M2 and Ketruda 200 MG IV every 3 weeks.  She was treated with 2 cycles of systemic chemotherapy with carboplatin, Alimta and Ketruda. She had that time of this treatment and had significant thrombocytopenia. Repeat imaging studies showed stable disease but the patient could not tolerate the treatment.  The  patient is currently undergoing treatment with Tagrisso 80 mg by mouth daily and tolerated the first month of her treatment fairly well. CBC does show some decrease in her hemoglobin and platelets. I am unclear if this is due to here disease, treatment, or the fact that she has been taking 250 mg more Keppra daily since the last time she was here. I recommended for her to continue this treatment as scheduled. WIll update Dr. Julien Nordmann about CBC results upon his return tomorrow. [facility] for symptomatic anemia. Will follow platelets closely.  For the metastatic bone disease, I continue the patient on Xgeva 120 mg subcutaneously on monthly basis. The patient was also advised to increased her intake of calcium supplement and vitamin D. For the nausea, she will continue on Zyprexa 10 mg by mouth daily at bedtime.  For pain management, she would continue on tramadol 50 mg by mouth every 6 hours as needed for pain. For the anemia, she will continue on Integra +1 capsule by mouth daily. The patient would come back for follow-up visit in 1 week for evaluation and repeat blood work.   She was advised to call immediately if she has any concerning symptoms in the interval. The patient voices understanding of current disease status and treatment options and is in agreement with the current care plan.   Mikey Bussing, DNP, AGPCNP-BC, AOCNP

## 2016-07-15 NOTE — Progress Notes (Signed)
Nutrition follow up completed with patient diagnosis of metastatic lung cancer. Last weight documented as 130 pounds on 07/16/16. Patient is eating small amounts of soft protein foods.  Dietary recall reveals patient consumes approximately 300-500 calories daily but this varies greatly. She tolerates Osmolite 1.5 via PEG 3-4 cans daily. She tries to drink 30 mL Prostat once daily. She complains of weakness and decreased strength.  Estimated Nutrition Needs: 1600-1800 calories, 76-85 grams protein, 1.7 L fluid.  Nutrition Diagnosis: Inadequate oral intake continues.  Intervention:  Recommend increase Osmolite 1.5 to 1 and 1/3 bottles three times daily with 60 cc free water before and after feedings. Recommended 30 mL prostat once to twice daily providing an additional 100 calories and 15 grams protein per 30 mL. Suggested patient put prostat through the feeding tube. Oral intake as tolerated.  TF plus Prostat provides approximately 1520 - 1620 calories, 75-90 grams protein, 1294 mL free water.  This meets greater than 90% estimated needs.  Monitoring, Evaluation, Goals: Patient will tolerate increase in TF to meet greater than 90% estimated needs.  Next Visit: To be scheduled as needed.  **Disclaimer: This note was dictated with voice recognition software. Similar sounding words can inadvertently be transcribed and this note may contain transcription errors which may not have been corrected upon publication of note.**

## 2016-07-15 NOTE — Progress Notes (Signed)
Reason for visit: Seizures  Laura Walter is an 65 y.o. female  History of present illness:  Laura Walter is a 65 year old right-handed white female with a history of metastatic non-small cell lung cancer. The patient has been in the hospital on 2 occasions recently, on October 7 she was admitted for generalized weakness, and she came back on November 5 with weakness and a hemoglobin of 5.9 requiring transfusion, and she had a fever of 100.5. The patient has a feeding tube in place, she will take in some oral nutrition at times. She has not had any recent seizure-type events, she is on Keppra taking 500 mg, 1.5 tablets at night and 1 tablet in the morning. The patient has had some difficulty walking, she has physical therapy coming out to the house. She feels weak in the legs. When she does walk, someone has to be with her in order for her to ambulate. She has developed a dropped head syndrome, she has a soft cervical collar to help keep her head up when eating. The patient does have some right shoulder discomfort when she elevates the arm, otherwise she does not have pain. The patient sleeps well at night.    Past Medical History:  Diagnosis Date  . Allergy    seasonal  . Bone cancer ([place]) 01/10/15 PET   lytic lesion lt 1st rib,T9,L3 Lt acetabulum  . Brain cancer ([place]) 01/13/15   MRI multiple small brain mets 10 individual lesions  . Bronchitis   . H/O reactive hypoglycemia   . IBS (irritable bowel syndrome)   . Lung cancer ([place])    stageIV nscca rul  . Osteopenia   . Paronychia 11/17/2015  . Pneumonia 2009 & 2011  . S/P radiation therapy completed 02/08/15   whole brain ,spine  . S/P radiation therapy completed 11/27/15   lung    Past Surgical History:  Procedure Laterality Date  . COLONOSCOPY  2009   Negative; [facility]  . ESOPHAGOGASTRODUODENOSCOPY (EGD) WITH PROPOFOL N/A 04/11/2015   Procedure: ESOPHAGOGASTRODUODENOSCOPY (EGD) WITH PROPOFOL;  Surgeon: Carol Ada, MD;  [facility];  Service: Endoscopy;  Laterality: N/A;  . PEG PLACEMENT N/A 04/11/2015   Procedure: PERCUTANEOUS ENDOSCOPIC GASTROSTOMY (PEG) PLACEMENT;  Surgeon: Carol Ada, MD;  Location[facility];  Service: Endoscopy;  Laterality: N/A;  . TONSILLECTOMY AND ADENOIDECTOMY    . WISDOM TOOTH EXTRACTION      Family History  Problem Relation Age of Onset  . Transient ischemic attack Mother   . Mental illness Mother     Dementia  . Diabetes Father   . Heart attack Father 45  . Heart attack Maternal Grandfather     in 35s  . Stomach cancer Maternal Aunt   . Heart attack Maternal Aunt     in 44s  . Diabetes Paternal Uncle     X 3    Social history:  reports that she has never smoked. She has never used smokeless tobacco. She reports that she drinks alcohol. She reports that she does not use drugs.    Allergies  Allergen Reactions  . Sulfonamide Derivatives Rash    Childhood reaction    Medications:  Prior to Admission medications   Medication Sig Start Date End Date Taking? Authorizing Provider  Amino Acids-Protein Hydrolys (FEEDING SUPPLEMENT, PRO-STAT SUGAR FREE 64,) LIQD Take 30 mLs by mouth 2 (two) times daily. 05/16/16  Yes Hosie Poisson, MD  FeFum-FePoly-FA-B Cmp-C-Biot (INTEGRA PLUS) CAPS Take 1 capsule by mouth  every morning. 06/11/16  Yes Curt Bears, MD  folic acid (FOLVITE) 1 MG tablet Take 1 mg by mouth daily. 07/07/16  Yes Historical Provider, MD  HYDROcodone-acetaminophen (NORCO) 5-325 MG tablet Take 1-2 tablets by mouth every 4 (four) hours as needed for moderate pain. 02/14/16  Yes Hayden Pedro, PA-C  levETIRAcetam (KEPPRA) 500 MG tablet 1 tablet in the morning and 1.5 tablets in the evening 06/26/16  Yes Kathrynn Ducking, MD  Nutritional Supplements (FEEDING SUPPLEMENT, OSMOLITE 1.5 CAL,) LIQD Place 237 mLs into feeding tube 4 (four) times daily. Patient taking differently: Place 237 mLs into feeding tube 3 (three) times daily.  04/14/15  Yes Carol Ada, MD   OLANZapine (ZYPREXA) 10 MG tablet TAKE 1 TABLET (10 MG TOTAL) BY MOUTH AT BEDTIME. (INS PAYS 12/13/2015) 05/24/16  Yes Curt Bears, MD  osimertinib mesylate (TAGRISSO) 80 MG tablet Take 1 tablet (80 mg total) by mouth daily. 05/29/16  Yes Curt Bears, MD  polyethylene glycol [facility] / GLYCOLAX) packet Take 17 g by mouth daily as needed. Patient taking differently: Take 17 g by mouth daily as needed for mild constipation or moderate constipation.  05/16/16  Yes Hosie Poisson, MD  senna-docusate (SENOKOT-S) 8.6-50 MG tablet Take 1 tablet by mouth 2 (two) times daily. 07/03/16  Yes Curt Bears, MD  traMADol (ULTRAM) 50 MG tablet Take 1 tablet (50 mg total) by mouth every 6 (six) hours as needed. 02/19/16  Yes Curt Bears, MD    ROS:  Out of a complete 14 system review of symptoms, the patient complains only of the following symptoms, and all other reviewed systems are negative.  Decreased activity, decreased appetite, fatigue Shortness of breath, cough Sleep talking Incontinence of the bladder, urinary urgency Walking difficulty, neck stiffness Skin wounds Memory loss, weakness, tremor  Blood pressure 98/66, pulse (!) 115, height '5\' 7"'$  (1.702 m), weight 130 lb (59 kg).  Physical Exam  General: The patient is alert and cooperative at the time of the examination.  Neuromuscular: Neck is in flexion, the patient is unable to fully extend the neck.  Skin: No significant peripheral edema is noted.   Neurologic Exam  Mental status: The patient is alert and oriented x 3 at the time of the examination. The patient has apparent normal recent and remote memory, with an apparently normal attention span and concentration ability.   Cranial nerves: Facial symmetry is present. Speech is normal, no aphasia or dysarthria is noted. Extraocular movements are full. Visual fields are full.  Motor: The patient has good strength in all 4 extremities.  Sensory examination: Soft touch  sensation is symmetric on the face, arms, and legs.  Coordination: The patient has good finger-nose-finger and heel-to-shin bilaterally.  Gait and station: The patient requires assistance with standing, with attempted standing she has flexion at the knees, unable to fully support her weight. The patient could not be ambulated.  Reflexes: Deep tendon reflexes are symmetric.   MRI brain 06/30/16:  IMPRESSION: 1. Stable slight increase in size of 6 mm lesion the high anterior left frontal lobe. 2. High left parietal lesion just posterior to the central sulcus is new, measuring 3 mm. 3. Other measured lesions are stable to slightly decreased in size. 4. Stable diffuse white matter changes compatible with radiation Therapy.  * MRI scan images were reviewed online. I agree with the written report.    Assessment/Plan:  1. Metastatic lung cancer  2. Seizures  The patient has done well on the current dose of  Keppra. The patient is having a gradual decline in her functional level. Recent MRI of the brain shows a new left parietal brain metastasis, the patient has gotten some stereotactic radiotherapy. The family claims that physical therapy is coming out of the house to help increase her strength and ability to ambulate. The patient will follow-up through this office in about 4 months. Lumbar puncture was not done, her oncologist apparently indicated that this would not alter her medical therapy.   Jill Alexanders MD 07/15/2016 12:40 PM  [facility] [address] [place], [place] [address]  Phone [phone] Fax [phone]

## 2016-07-16 ENCOUNTER — Ambulatory Visit (HOSPITAL_COMMUNITY)
Admission: RE | Admit: 2016-07-16 | Discharge: 2016-07-16 | Disposition: A | Discharge status: Home / Self Care | Payer: BC Managed Care – PPO | Source: Ambulatory Visit | Attending: Oncology | Admitting: Oncology

## 2016-07-16 DIAGNOSIS — C3490 Malignant neoplasm of unspecified part of unspecified bronchus or lung: Secondary | ICD-10-CM | POA: Diagnosis not present

## 2016-07-16 MED ORDER — ACETAMINOPHEN 325 MG PO TABS
650.0000 mg | ORAL_TABLET | Freq: Once | ORAL | Status: AC
  Administered 2016-07-16: 650 mg via ORAL
  Filled 2016-07-16: qty 2

## 2016-07-16 MED ORDER — DIPHENHYDRAMINE HCL 25 MG PO CAPS
25.0000 mg | ORAL_CAPSULE | Freq: Once | ORAL | Status: AC
  Administered 2016-07-16: 25 mg via ORAL
  Filled 2016-07-16: qty 1

## 2016-07-16 MED ORDER — SODIUM CHLORIDE 0.9 % IV SOLN
250.0000 mL | Freq: Once | INTRAVENOUS | Status: AC
  Administered 2016-07-16: 250 mL via INTRAVENOUS

## 2016-07-16 NOTE — Progress Notes (Signed)
Patient ID: Laura Walter, female   DOB: [birth year], 65 y.o.   MRN: [id number]  Procedure: Transfusion of 2 units of PRBC via PIV as  Ordered.  Provider: Mikey Bussing NP  Associated Diagnosis:Malignant neoplasm of lung, unspecified laterality, unspecified part of lung ([place]) (C34.90  Patient tolerated transfusion well. No reaction. Went over discharge instructions with patient and family present.  Copy given to patient and family. Assisted to wheelchair with 2 assists.  Alert, oriented. Discharged to home.

## 2016-07-16 NOTE — Discharge Instructions (Signed)

## 2016-07-17 ENCOUNTER — Encounter (HOSPITAL_COMMUNITY): Payer: Self-pay

## 2016-07-17 LAB — TYPE AND SCREEN
BLOOD PRODUCT EXPIRATION DATE: 201712192359
Blood Product Expiration Date: 201712192359
ISSUE DATE / TIME: 201712121040
ISSUE DATE / TIME: 201712121040
UNIT TYPE AND RH: 1700
Unit Type and Rh: 1700

## 2016-07-18 ENCOUNTER — Telehealth: Payer: Self-pay | Admitting: General Practice

## 2016-07-18 NOTE — Telephone Encounter (Signed)
Spoke with BP confirmed 12/18 appts.

## 2016-07-19 ENCOUNTER — Telehealth: Payer: Self-pay | Admitting: *Deleted

## 2016-07-19 NOTE — Telephone Encounter (Signed)
OT from [facility] requesting continuation of OT for 2 more weeks.   OK to continue per Dr Julien Nordmann

## 2016-07-22 ENCOUNTER — Other Ambulatory Visit (HOSPITAL_BASED_OUTPATIENT_CLINIC_OR_DEPARTMENT_OTHER): Payer: BC Managed Care – PPO

## 2016-07-22 ENCOUNTER — Encounter: Payer: Self-pay | Admitting: Oncology

## 2016-07-22 ENCOUNTER — Ambulatory Visit (HOSPITAL_BASED_OUTPATIENT_CLINIC_OR_DEPARTMENT_OTHER): Payer: BC Managed Care – PPO | Admitting: Oncology

## 2016-07-22 ENCOUNTER — Encounter: Payer: Self-pay | Admitting: *Deleted

## 2016-07-22 VITALS — BP 106/65 | HR 123 | Ht 67.0 in | Wt 130.5 lb

## 2016-07-22 DIAGNOSIS — C7931 Secondary malignant neoplasm of brain: Secondary | ICD-10-CM | POA: Diagnosis not present

## 2016-07-22 DIAGNOSIS — C7951 Secondary malignant neoplasm of bone: Secondary | ICD-10-CM

## 2016-07-22 DIAGNOSIS — R531 Weakness: Secondary | ICD-10-CM | POA: Diagnosis not present

## 2016-07-22 DIAGNOSIS — C3411 Malignant neoplasm of upper lobe, right bronchus or lung: Secondary | ICD-10-CM

## 2016-07-22 DIAGNOSIS — D630 Anemia in neoplastic disease: Secondary | ICD-10-CM

## 2016-07-22 DIAGNOSIS — Z5111 Encounter for antineoplastic chemotherapy: Secondary | ICD-10-CM

## 2016-07-22 DIAGNOSIS — Z79899 Other long term (current) drug therapy: Secondary | ICD-10-CM | POA: Diagnosis not present

## 2016-07-22 LAB — CBC WITH DIFFERENTIAL/PLATELET
BASO%: 0.3 % (ref 0.0–2.0)
Basophils Absolute: 0 10*3/uL (ref 0.0–0.1)
EOS%: 1.7 % (ref 0.0–7.0)
Eosinophils Absolute: 0.1 10*3/uL (ref 0.0–0.5)
HCT: 31.9 % — ABNORMAL LOW (ref 34.8–46.6)
HEMOGLOBIN: 10.1 g/dL — AB (ref 11.6–15.9)
LYMPH%: 6.1 % — ABNORMAL LOW (ref 14.0–49.7)
MCH: 28.3 pg (ref 25.1–34.0)
MCHC: 31.6 g/dL (ref 31.5–36.0)
MCV: 89.7 fL (ref 79.5–101.0)
MONO#: 0.8 10*3/uL (ref 0.1–0.9)
MONO%: 11.8 % (ref 0.0–14.0)
NEUT%: 80.1 % — ABNORMAL HIGH (ref 38.4–76.8)
NEUTROS ABS: 5.6 10*3/uL (ref 1.5–6.5)
PLATELETS: 48 10*3/uL — AB (ref 145–400)
RBC: 3.56 10*6/uL — AB (ref 3.70–5.45)
RDW: 20.1 % — AB (ref 11.2–14.5)
WBC: 7 10*3/uL (ref 3.9–10.3)
lymph#: 0.4 10*3/uL — ABNORMAL LOW (ref 0.9–3.3)

## 2016-07-22 LAB — COMPREHENSIVE METABOLIC PANEL
ALBUMIN: 2.1 g/dL — AB (ref 3.5–5.0)
ALT: 99 U/L — AB (ref 0–55)
ANION GAP: 12 meq/L — AB (ref 3–11)
AST: 82 U/L — ABNORMAL HIGH (ref 5–34)
Alkaline Phosphatase: 73 U/L (ref 40–150)
BILIRUBIN TOTAL: 0.39 mg/dL (ref 0.20–1.20)
BUN: 40.9 mg/dL — ABNORMAL HIGH (ref 7.0–26.0)
CALCIUM: 9 mg/dL (ref 8.4–10.4)
CO2: 21 meq/L — AB (ref 22–29)
CREATININE: 0.8 mg/dL (ref 0.6–1.1)
Chloride: 105 mEq/L (ref 98–109)
EGFR: 74 mL/min/{1.73_m2} — ABNORMAL LOW (ref >90)
Glucose: 163 mg/dl — ABNORMAL HIGH (ref 70–140)
Potassium: 3.9 mEq/L (ref 3.5–5.1)
Sodium: 139 mEq/L (ref 136–145)
Total Protein: 6.9 g/dL (ref 6.4–8.3)

## 2016-07-22 LAB — TSH: TSH: 2.407 m[IU]/L (ref 0.308–3.960)

## 2016-07-22 MED ORDER — OMEPRAZOLE 20 MG PO CPDR: 20.0000 mg | DELAYED_RELEASE_CAPSULE | Freq: Every day | ORAL | 1 refills | Status: AC

## 2016-07-22 NOTE — Progress Notes (Signed)
Oncology Nurse Navigator Documentation  Oncology Nurse Navigator Flowsheets 07/22/2016  Navigator Location [facility]  Navigator Encounter Type [facility] Follow-up;Other/Laura Walter is here today with complaints of her feeding tube needs replacing.  It looks like tip of tube needs replacing.  I called Dr. Lloyd Huger office.  He is seeing her today. He is ready to see her at anytime today.  I updated friends that patient can be seen today at anytime after seeing Dr. Julien Nordmann.  They wanted me to call back to office and ask them if patient can be seen in the car.  They stated no. I updated them that they may need help to get out of the car at visit.  I updated friends.   Treatment Phase Treatment  Barriers/Navigation Needs Coordination of Care  Interventions Coordination of Care;Other  Coordination of Care Appts;Other  Acuity Level 3  Acuity Level 3 Coordination of multimodality treatment  Time Spent with Patient 45

## 2016-07-22 NOTE — Progress Notes (Signed)
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 Lisbeth Renshaw. 2) Gilotrif 40 mg by mouth daily. Status post 5 months of treatment. 3) stereotactic radiotherapy to the enlarging right upper lobe lung mass under the care of Dr. Lisbeth Renshaw. 4) stereotactic radiotherapy to another right upper lobe lung mass as well as solitary brain metastases. 5) Gilotrif 30 mg by mouth daily. First dose started 08/31/2015. Status post 6 months of treatment, discontinued secondary to disease progression. 6) palliative radiotherapy to the progressive lesion in the right side of the chest as well as the metastatic bone lesion involving C6 and C7 vertebrae with epidural tumor in the lateral recess as well as palliative radiotherapy to T8-the in the spine, under the care of Dr. Lisbeth Renshaw completed on 03/04/2016. 7) Systemic chemotherapy with carboplatin for AUC of 5, Alimta 500 MG/M2 and Ketruda (pembrolizumab) 200 MG/M2 every 3 weeks. First dose 03/13/2016. Status post 2 cycles discontinued secondary to intolerance and severe thrombocytopenia.   CURRENT THERAPY:  1) Tagrisso 80 mg by mouth daily started 06/17/2016. 2) Xgeva 120 mg subcutaneously every 4 weeks. First dose 05/01/2015.  INTERVAL HISTORY: Laura Walter 65 y.o. female returns to the clinic today for follow-up visit accompanied by 2 of her friends. The patient was seen last week for routine follow up. She was noted to be anemic and have thrombocytopenia so she was brought back  this week for close monitoring. She was given 2 units of packed red blood cells for her anemia last week. She felt better for about one day but now has worsening of her fatigue and weakness. She has ongoing increased shortness of breath with exertion but no significant chest pain, cough or hemoptysis. She denied having any fever or chills. She has no nausea, vomiting or diarrhea. Reporting increased reflux symptoms. He has a feeding tube in place that appears to have a broken opening. They were instructed to call Dr. Benson Norway last week to have this evaluated, but they have not done this. She denied having any skin rash. She is currently on treatment with Tagrisso 80 mg by mouth daily and tolerating it fairly well. She is here today for evaluation with repeat blood work.  MEDICAL HISTORY: Past Medical History:  Diagnosis Date  . Allergy    seasonal  . Bone cancer ([facility]) 01/10/15 PET   lytic lesion lt 1st rib,T9,L3 Lt acetabulum  . Brain cancer ([facility]) 01/13/15   MRI multiple small brain mets 10 individual lesions  . Bronchitis   . H/O reactive hypoglycemia   . IBS (irritable bowel syndrome)   . Lung cancer ([facility])    stageIV nscca rul  . Osteopenia   . Paronychia 11/17/2015  . Pneumonia 2009 & 2011  . S/P radiation therapy completed 02/08/15   whole brain ,spine  . S/P radiation therapy completed 11/27/15   lung    ALLERGIES:  is allergic to sulfonamide derivatives.  MEDICATIONS:  Current Outpatient Prescriptions  Medication Sig Dispense Refill  . Amino Acids-Protein Hydrolys (FEEDING SUPPLEMENT, PRO-STAT SUGAR  FREE 64,) LIQD Take 30 mLs by mouth 2 (two) times daily. 900 mL 0  . FeFum-FePoly-FA-B Cmp-C-Biot (INTEGRA PLUS) CAPS Take 1 capsule by mouth every morning. 90 capsule 0  . folic acid (FOLVITE) 1 MG tablet Take 1 mg by mouth daily.    Marland Kitchen HYDROcodone-acetaminophen (NORCO) 5-325 MG tablet Take 1-2 tablets by mouth every 4 (four) hours as needed for moderate pain. 60 tablet 0  . levETIRAcetam  (KEPPRA) 500 MG tablet 1 tablet in the morning and 1.5 tablets in the evening 75 tablet 11  . Nutritional Supplements (FEEDING SUPPLEMENT, OSMOLITE 1.5 CAL,) LIQD Place 237 mLs into feeding tube 4 (four) times daily. (Patient taking differently: Place 237 mLs into feeding tube 3 (three) times daily. ) 958 mL 12  . OLANZapine (ZYPREXA) 10 MG tablet TAKE 1 TABLET (10 MG TOTAL) BY MOUTH AT BEDTIME. (INS PAYS 12/13/2015) 30 tablet 0  . osimertinib mesylate (TAGRISSO) 80 MG tablet Take 1 tablet (80 mg total) by mouth daily. 30 tablet 2  . polyethylene glycol (MIRALAX / GLYCOLAX) packet Take 17 g by mouth daily as needed. 14 each 0  . senna-docusate (SENOKOT-S) 8.6-50 MG tablet Take 1 tablet by mouth 2 (two) times daily. 30 tablet 0  . traMADol (ULTRAM) 50 MG tablet Take 1 tablet (50 mg total) by mouth every 6 (six) hours as needed. 60 tablet 0  . omeprazole (PRILOSEC) 20 MG capsule Take 1 capsule (20 mg total) by mouth daily. 30 capsule 1   No current facility-administered medications for this visit.     SURGICAL HISTORY:  Past Surgical History:  Procedure Laterality Date  . COLONOSCOPY  2009   Negative; [facility]  . ESOPHAGOGASTRODUODENOSCOPY (EGD) WITH PROPOFOL N/A 04/11/2015   Procedure: ESOPHAGOGASTRODUODENOSCOPY (EGD) WITH PROPOFOL;  Surgeon: Carol Ada, MD;  Location: [facility];  Service: Endoscopy;  Laterality: N/A;  . PEG PLACEMENT N/A 04/11/2015   Procedure: PERCUTANEOUS ENDOSCOPIC GASTROSTOMY (PEG) PLACEMENT;  Surgeon: Carol Ada, MD;  Location: [facility];  Service: Endoscopy;  Laterality: N/A;  . TONSILLECTOMY AND ADENOIDECTOMY    . WISDOM TOOTH EXTRACTION      REVIEW OF SYSTEMS:  A comprehensive review of systems was negative except for: Constitutional: positive for anorexia and fatigue Respiratory: positive for dyspnea on exertion Musculoskeletal: positive for arthralgias and muscle weakness   PHYSICAL EXAMINATION: General appearance: alert, cooperative, fatigued and no  distress Head: Normocephalic, without obvious abnormality, Several areas of bruises and ecchymosis on the right side of the face Neck: no adenopathy, no JVD, supple, symmetrical, trachea midline and thyroid not enlarged, symmetric, no tenderness/mass/nodules Lymph nodes: Cervical, supraclavicular, and axillary nodes normal. Resp: clear to auscultation bilaterally Back: Tenderness to palpation at the lower thoracic and lumbar vertebrae. Cardio: regular rate and rhythm, S1, S2 normal, no murmur, click, rub or gallop GI: soft, non-tender; bowel sounds normal; no masses,  no organomegaly Extremities: extremities normal, atraumatic, no cyanosis or edema Neurologic: Alert and oriented X 3, normal strength and tone. Normal symmetric reflexes. Normal coordination and gait    ECOG PERFORMANCE STATUS: 2 - Symptomatic, <50% confined to bed  Blood pressure 106/65, pulse (!) 123, height 5' 7"  (1.702 m), weight 130 lb 8 oz (59.2 kg), SpO2 97 %.  LABORATORY DATA: Lab Results  Component Value Date   WBC 7.0 07/22/2016   HGB 10.1 (L) 07/22/2016   HCT 31.9 (L) 07/22/2016   MCV 89.7 07/22/2016   PLT 48 (L) 07/22/2016      Chemistry  Component Value Date/Time   NA 139 07/22/2016 0844   K 3.9 07/22/2016 0844   CL 107 06/10/2016 1342   CO2 21 (L) 07/22/2016 0844   BUN 40.9 (H) 07/22/2016 0844   CREATININE 0.8 07/22/2016 0844      Component Value Date/Time   CALCIUM 9.0 07/22/2016 0844   ALKPHOS 73 07/22/2016 0844   AST 82 (H) 07/22/2016 0844   ALT 99 (H) 07/22/2016 0844   BILITOT 0.39 07/22/2016 0844       RADIOGRAPHIC STUDIES: Mr Jeri Cos NI Contrast  Result Date: 06/30/2016 CLINICAL DATA:  Lung cancer.  Sixteen month post SRS restaging. EXAM: MRI HEAD WITHOUT AND WITH CONTRAST TECHNIQUE: Multiplanar, multiecho pulse sequences of the brain and surrounding structures were obtained without and with intravenous contrast. CONTRAST:  56m MULTIHANCE GADOBENATE DIMEGLUMINE 529 MG/ML IV SOLN  COMPARISON:  None. FINDINGS: Brain: Diffuse post radiation changes are again seen throughout the white matter. No acute infarct or hemorrhage is present. A remote punctate infarct is again seen within the left cerebellum. Metastatic lesions are annotated on series 10 Previously noted lesion along the medial right cerebellum has had decreased in size (image 36). A 5 mm marrow posterior lateral right cerebellar lesion has also decreased in size (image 33). The lesion in the high anterior left frontal lobe is stable to slightly increased in size, measuring 6 x 3 x 5 mm (image 128). A lesion near the vertex just posterior to the central sulcus has increased in size, now measuring 3 mm (image 141). A punctate area of enhancement in the anterior left frontal lobe may be vascular (image 136). A punctate focus of enhancement more inferiorly in the right frontal lobe is only seen on the axial images. This may be a metastatic lesion. There is no significant interval change. (Image 104). Vascular: Flow is present in the major intracranial arteries. Skull and upper cervical spine: The skullbase is within normal limits. Midline sagittal structures are unremarkable. Mild degenerative changes are noted in the upper cervical spine. Sinuses/Orbits: The paranasal sinuses and mastoid air cells are clear. IMPRESSION: 1. Stable slight increase in size of 6 mm lesion the high anterior left frontal lobe. 2. High left parietal lesion just posterior to the central sulcus is new, measuring 3 mm. 3. Other measured lesions are stable to slightly decreased in size. 4. Stable diffuse white matter changes compatible with radiation therapy. Electronically Signed   By: CSan MorelleM.D.   On: 06/30/2016 19:20    ASSESSMENT AND PLAN: This is a very pleasant 65year old never smoker white female recently diagnosed with stage IV non-small cell lung cancer, adenocarcinoma with positive EGFR mutation with deletion in exon 19 presented with  large right upper lobe lung mass in addition to bilateral mediastinal, supraclavicular lymphadenopathy as well as bone and multiple brain metastasis diagnosed in June 2016.  The patient completed whole brain irradiation under the care of Dr. MLisbeth Renshaw She was started on treatment with Gilotrif 40 mg by mouth daily status post 5 months but this was discontinued secondary to intolerance with significant bony contact, skin rash in the sacral area as well as few episodes of diarrhea  The patient was started on Gilotrif 30 mg by mouth daily since 08/31/2015. She is tolerating it much better with the reduced dose was less adverse effects. Unfortunately the recent CT scan of the chest, abdomen and pelvis showed interval increase in the size of the right upper lobe lung mass compatible with disease progression. She  is currently undergoing stereotactic radiotherapy to the enlarging right upper lobe lung mass. Molecular studies from blood test as well as CT-guided core biopsy of the enlarging right upper lobe lung mass showed no evidence for T790M resistant mutation. She is tolerating her treatment with Gilotrif fairly well. Unfortunately the recent CT scan of the chest, abdomen and pelvis showed mixed response with new pleural based nodule adjacent to the previously treated right upper lobe mass. She underwent palliative radiotherapy to the new pleural-based nodule under the care of Dr. Lisbeth Renshaw. She is also underwent stereotactic radiotherapy to new solitary brain metastasis. She also underwent palliative radiotherapy to the lesion in the cervical spines.  I had a lengthy discussion with the patient and her family today about her current disease status and treatment options. I recommended for the patient to discontinue her treatment with Gilotrif at this point. She was given the option of palliative care versus proceeding with systemic chemotherapy with carboplatin for AUC of 5, Alimta 500 MG/M2 and Ketruda 200 MG IV  every 3 weeks.  She was treated with 2 cycles of systemic chemotherapy with carboplatin, Alimta and Ketruda. She had that time of this treatment and had significant thrombocytopenia. Repeat imaging studies showed stable disease but the patient could not tolerate the treatment.  The patient is currently undergoing treatment with Tagrisso 80 mg by mouth daily and tolerated the first month of her treatment fairly well.  The patient is seen today with Dr. Julien Nordmann. CBC today does show some improvement in her hemoglobin, but a further drop in her platelets.  Recommend the patient hold Tagrisso for 1 week to allow her counts to recover and then resume. She will have a restaging CT scan of the chest abdomen and pelvis performed in approximately 2 weeks to assess her response to therapy.  The patient continues omeprazole 20 mg daily for her reflux symptoms.  The patient will return in 2 weeks to review her CT scan results. The patient was instructed to go to Dr. Ulyses Amor office immediately following his appointment as he will be waiting for her to address her feeding tube issues.   She was advised to call immediately if she has any concerning symptoms in the interval. The patient voices understanding of current disease status and treatment options and is in agreement with the current care plan.   Mikey Bussing, DNP, AGPCNP-BC, AOCNP    ADDENDUM: Hematology/Oncology Attending: I had a face to face encounter with the patient. I recommended her care plan. She is a very pleasant 66 years old white female with metastatic non-small cell lung cancer, adenocarcinoma with positive EGFR mutation with deletion in exon 60. The patient was treated with Gilotrif but has evidence for disease progression after around 10 months of treatment. She also received palliative radiotherapy to multiple metastatic brain lesion as well as enlarging right lung pleural based masses. She was also treated with systemic chemotherapy with  carboplatin, Alimta and Ketruda status post 2 cycles discontinued secondary to intolerance but has a stable disease. She is currently on treatment with Tagrisso 80 mg by mouth daily status post 1 months of treatment. She has been tolerating this treatment well with no specific complaints. Her CBC today showed thrombocytopenia. I recommended for the patient to hold her treatment with Tagrisso for 1 week because of the questionable drug-induced thrombocytopenia. For the anemia of neoplastic disease, she received 2 units of PRBCs transfusion. Her hemoglobin and hematocrit are better. The patient would come back for follow-up visit in  2 weeks for evaluation with repeat CT scan of the chest, abdomen and pelvis for restaging of her disease. She was advised to call immediately if she has any concerning symptoms in the interval.  Disclaimer: This note was dictated with voice recognition software. Similar sounding words can inadvertently be transcribed and may be missed upon review. Eilleen Kempf., MD 07/23/16

## 2016-07-23 ENCOUNTER — Telehealth: Payer: Self-pay | Admitting: Medical Oncology

## 2016-07-23 ENCOUNTER — Telehealth: Payer: Self-pay | Admitting: *Deleted

## 2016-07-23 NOTE — Telephone Encounter (Signed)
[facility] for continued visits for wound care ? Ok per [facility] and called to [employer].

## 2016-07-23 NOTE — Telephone Encounter (Signed)
"  Arnetha Courser Nurse Manager with  [facility] calling requesting orders for home care with dressing change orders for this patient.  Shared with [facility] nurse reports providing these orders to a nurse earlier this morning.

## 2016-07-24 ENCOUNTER — Other Ambulatory Visit: Payer: Self-pay | Admitting: Medical Oncology

## 2016-07-24 DIAGNOSIS — C7951 Secondary malignant neoplasm of bone: Secondary | ICD-10-CM

## 2016-07-24 DIAGNOSIS — C3411 Malignant neoplasm of upper lobe, right bronchus or lung: Secondary | ICD-10-CM

## 2016-07-24 DIAGNOSIS — Z5111 Encounter for antineoplastic chemotherapy: Secondary | ICD-10-CM

## 2016-07-24 MED ORDER — INTEGRA PLUS PO CAPS: 1.0000 | ORAL_CAPSULE | Freq: Every morning | ORAL | 0 refills | Status: AC

## 2016-07-26 ENCOUNTER — Telehealth: Payer: Self-pay | Admitting: *Deleted

## 2016-07-26 NOTE — Telephone Encounter (Signed)
"  I am Teodoro Spray, caregiver for this patient.  This morning she has started coughing.  Had a three minute spell of continuous coughing is what prompted me to call.  Dry cough, temp = 33.6 celsius (92.5 F).  She is normally short of breath and no change in her breathing.  I've given her tube feeding.  Cough not associating with tube feeding administration.  With the holidays coming does he want her to have something prophylactic or see her.  Have not tried anything OTC.  I was sick last week, other people cared for her in my absence.  Return number [phone]"

## 2016-07-26 NOTE — Telephone Encounter (Signed)
Verbal order received and read back from Dr. Julien Nordmann for nothing prohylactic.  Okay to try anything OTC for cough symptom.  Order given to Caregiver at this time.  examples mentioned are Robitussin DM or mucinex DM.  Advised she call if any changes or worsening symptoms.  No further questions.

## 2016-07-29 ENCOUNTER — Encounter (HOSPITAL_COMMUNITY): Payer: Self-pay | Admitting: Emergency Medicine

## 2016-07-29 ENCOUNTER — Emergency Department (HOSPITAL_COMMUNITY): Payer: BC Managed Care – PPO

## 2016-07-29 ENCOUNTER — Other Ambulatory Visit: Payer: Self-pay

## 2016-07-29 ENCOUNTER — Inpatient Hospital Stay (HOSPITAL_COMMUNITY)
Admission: EM | Admit: 2016-07-29 | Discharge: 2016-08-01 | DRG: 871 | Disposition: A | Discharge status: Hospice | Payer: BC Managed Care – PPO | Attending: Internal Medicine | Admitting: Internal Medicine

## 2016-07-29 DIAGNOSIS — L89153 Pressure ulcer of sacral region, stage 3: Secondary | ICD-10-CM | POA: Diagnosis present

## 2016-07-29 DIAGNOSIS — J9601 Acute respiratory failure with hypoxia: Secondary | ICD-10-CM | POA: Diagnosis present

## 2016-07-29 DIAGNOSIS — C3490 Malignant neoplasm of unspecified part of unspecified bronchus or lung: Secondary | ICD-10-CM

## 2016-07-29 DIAGNOSIS — D6481 Anemia due to antineoplastic chemotherapy: Secondary | ICD-10-CM | POA: Diagnosis present

## 2016-07-29 DIAGNOSIS — D630 Anemia in neoplastic disease: Secondary | ICD-10-CM | POA: Diagnosis present

## 2016-07-29 DIAGNOSIS — C7951 Secondary malignant neoplasm of bone: Secondary | ICD-10-CM | POA: Diagnosis present

## 2016-07-29 DIAGNOSIS — J90 Pleural effusion, not elsewhere classified: Secondary | ICD-10-CM | POA: Diagnosis present

## 2016-07-29 DIAGNOSIS — C761 Malignant neoplasm of thorax: Secondary | ICD-10-CM | POA: Diagnosis present

## 2016-07-29 DIAGNOSIS — Z79899 Other long term (current) drug therapy: Secondary | ICD-10-CM

## 2016-07-29 DIAGNOSIS — Z515 Encounter for palliative care: Secondary | ICD-10-CM | POA: Diagnosis present

## 2016-07-29 DIAGNOSIS — Z882 Allergy status to sulfonamides status: Secondary | ICD-10-CM

## 2016-07-29 DIAGNOSIS — C7931 Secondary malignant neoplasm of brain: Secondary | ICD-10-CM | POA: Diagnosis present

## 2016-07-29 DIAGNOSIS — C3411 Malignant neoplasm of upper lobe, right bronchus or lung: Secondary | ICD-10-CM | POA: Diagnosis present

## 2016-07-29 DIAGNOSIS — Z923 Personal history of irradiation: Secondary | ICD-10-CM

## 2016-07-29 DIAGNOSIS — Z79891 Long term (current) use of opiate analgesic: Secondary | ICD-10-CM

## 2016-07-29 DIAGNOSIS — L8990 Pressure ulcer of unspecified site, unspecified stage: Secondary | ICD-10-CM | POA: Insufficient documentation

## 2016-07-29 DIAGNOSIS — R000 Tachycardia, unspecified: Secondary | ICD-10-CM | POA: Diagnosis present

## 2016-07-29 DIAGNOSIS — T451X5A Adverse effect of antineoplastic and immunosuppressive drugs, initial encounter: Secondary | ICD-10-CM | POA: Diagnosis present

## 2016-07-29 DIAGNOSIS — A419 Sepsis, unspecified organism: Secondary | ICD-10-CM | POA: Diagnosis not present

## 2016-07-29 DIAGNOSIS — Z7189 Other specified counseling: Secondary | ICD-10-CM

## 2016-07-29 DIAGNOSIS — Z9889 Other specified postprocedural states: Secondary | ICD-10-CM

## 2016-07-29 DIAGNOSIS — J869 Pyothorax without fistula: Secondary | ICD-10-CM | POA: Diagnosis present

## 2016-07-29 DIAGNOSIS — R0602 Shortness of breath: Secondary | ICD-10-CM | POA: Diagnosis not present

## 2016-07-29 DIAGNOSIS — D696 Thrombocytopenia, unspecified: Secondary | ICD-10-CM | POA: Diagnosis present

## 2016-07-29 DIAGNOSIS — J189 Pneumonia, unspecified organism: Secondary | ICD-10-CM | POA: Diagnosis present

## 2016-07-29 DIAGNOSIS — Z66 Do not resuscitate: Secondary | ICD-10-CM | POA: Diagnosis present

## 2016-07-29 LAB — CBC WITH DIFFERENTIAL/PLATELET
BASOS PCT: 0 %
Basophils Absolute: 0 10*3/uL (ref 0.0–0.1)
EOS PCT: 1 %
Eosinophils Absolute: 0.1 10*3/uL (ref 0.0–0.7)
HCT: 25.9 % — ABNORMAL LOW (ref 36.0–46.0)
Hemoglobin: 8.2 g/dL — ABNORMAL LOW (ref 12.0–15.0)
LYMPHS ABS: 0.4 10*3/uL — AB (ref 0.7–4.0)
Lymphocytes Relative: 5 %
MCH: 29 pg (ref 26.0–34.0)
MCHC: 31.7 g/dL (ref 30.0–36.0)
MCV: 91.5 fL (ref 78.0–100.0)
MONO ABS: 0.7 10*3/uL (ref 0.1–1.0)
Monocytes Relative: 9 %
Neutro Abs: 6.4 10*3/uL (ref 1.7–7.7)
Neutrophils Relative %: 85 %
PLATELETS: 41 10*3/uL — AB (ref 150–400)
RBC: 2.83 MIL/uL — ABNORMAL LOW (ref 3.87–5.11)
RDW: 18.9 % — ABNORMAL HIGH (ref 11.5–15.5)
WBC: 7.6 10*3/uL (ref 4.0–10.5)

## 2016-07-29 LAB — COMPREHENSIVE METABOLIC PANEL
ALT: 68 U/L — AB (ref 14–54)
AST: 67 U/L — AB (ref 15–41)
Albumin: 2.2 g/dL — ABNORMAL LOW (ref 3.5–5.0)
Alkaline Phosphatase: 62 U/L (ref 38–126)
Anion gap: 8 (ref 5–15)
BUN: 47 mg/dL — AB (ref 6–20)
CHLORIDE: 108 mmol/L (ref 101–111)
CO2: 24 mmol/L (ref 22–32)
CREATININE: 1.03 mg/dL — AB (ref 0.44–1.00)
Calcium: 8 mg/dL — ABNORMAL LOW (ref 8.9–10.3)
GFR calc Af Amer: >60 mL/min (ref >60)
GFR, EST NON AFRICAN AMERICAN: 57 mL/min — AB (ref >60)
Glucose, Bld: 126 mg/dL — ABNORMAL HIGH (ref 65–99)
Potassium: 4.1 mmol/L (ref 3.5–5.1)
Sodium: 140 mmol/L (ref 135–145)
Total Bilirubin: 0.3 mg/dL (ref 0.3–1.2)
Total Protein: 6 g/dL — ABNORMAL LOW (ref 6.5–8.1)

## 2016-07-29 LAB — I-STAT CG4 LACTIC ACID, ED: Lactic Acid, Venous: 2.09 mmol/L (ref 0.5–1.9)

## 2016-07-29 MED ORDER — SODIUM CHLORIDE 0.9 % IV SOLN
INTRAVENOUS | Status: DC
  Administered 2016-07-30 – 2016-07-31 (×3): via INTRAVENOUS
  Administered 2016-08-01: 125 mL/h via INTRAVENOUS

## 2016-07-29 MED ORDER — SODIUM CHLORIDE 0.9 % IV BOLUS (SEPSIS)
1000.0000 mL | Freq: Once | INTRAVENOUS | Status: AC
  Administered 2016-07-29: 1000 mL via INTRAVENOUS

## 2016-07-29 MED ORDER — LEVETIRACETAM 750 MG PO TABS
750.0000 mg | ORAL_TABLET | Freq: Every day | ORAL | Status: DC
  Administered 2016-07-30 – 2016-07-31 (×2): 750 mg via ORAL
  Filled 2016-07-29 (×4): qty 1

## 2016-07-29 MED ORDER — OLANZAPINE 5 MG PO TABS
10.0000 mg | ORAL_TABLET | Freq: Every day | ORAL | Status: DC
  Administered 2016-07-30 – 2016-07-31 (×2): 10 mg via ORAL
  Filled 2016-07-29: qty 1
  Filled 2016-07-29: qty 2
  Filled 2016-07-29: qty 1

## 2016-07-29 MED ORDER — LEVETIRACETAM 500 MG PO TABS: 500.0000 mg | ORAL_TABLET | Freq: Two times a day (BID) | ORAL | Status: DC

## 2016-07-29 MED ORDER — HYDROCODONE-ACETAMINOPHEN 5-325 MG PO TABS: 1.0000 | ORAL_TABLET | ORAL | Status: DC | PRN

## 2016-07-29 MED ORDER — DEXTROSE 5 % IV SOLN
2.0000 g | Freq: Once | INTRAVENOUS | Status: AC
  Administered 2016-07-29: 2 g via INTRAVENOUS
  Filled 2016-07-29: qty 2

## 2016-07-29 MED ORDER — SENNOSIDES-DOCUSATE SODIUM 8.6-50 MG PO TABS
1.0000 | ORAL_TABLET | Freq: Two times a day (BID) | ORAL | Status: DC
  Administered 2016-07-30 – 2016-08-01 (×5): 1 via ORAL
  Filled 2016-07-29 (×5): qty 1

## 2016-07-29 MED ORDER — PRO-STAT SUGAR FREE PO LIQD
30.0000 mL | Freq: Two times a day (BID) | ORAL | Status: DC
  Administered 2016-07-30 – 2016-08-01 (×5): 30 mL via ORAL
  Filled 2016-07-29 (×5): qty 30

## 2016-07-29 MED ORDER — METRONIDAZOLE 500 MG PO TABS: 500.0000 mg | ORAL_TABLET | Freq: Once | ORAL | Status: DC

## 2016-07-29 MED ORDER — TRAMADOL HCL 50 MG PO TABS: 50.0000 mg | ORAL_TABLET | Freq: Four times a day (QID) | ORAL | Status: DC | PRN

## 2016-07-29 MED ORDER — OSMOLITE 1.5 CAL PO LIQD
237.0000 mL | Freq: Three times a day (TID) | ORAL | Status: DC
  Administered 2016-07-30 – 2016-08-01 (×7): 237 mL
  Filled 2016-07-29 (×9): qty 237

## 2016-07-29 MED ORDER — ADULT MULTIVITAMIN W/MINERALS CH
1.0000 | ORAL_TABLET | Freq: Every morning | ORAL | Status: DC
  Administered 2016-07-30 – 2016-08-01 (×3): 1 via ORAL
  Filled 2016-07-29 (×3): qty 1

## 2016-07-29 MED ORDER — VANCOMYCIN HCL IN DEXTROSE 1-5 GM/200ML-% IV SOLN
1000.0000 mg | Freq: Once | INTRAVENOUS | Status: AC
  Administered 2016-07-29: 1000 mg via INTRAVENOUS
  Filled 2016-07-29: qty 200

## 2016-07-29 MED ORDER — METRONIDAZOLE 500 MG PO TABS: 500.0000 mg | ORAL_TABLET | Freq: Three times a day (TID) | ORAL | 0 refills | Status: DC

## 2016-07-29 MED ORDER — POLYETHYLENE GLYCOL 3350 17 G PO PACK: 17.0000 g | PACK | Freq: Every day | ORAL | Status: DC | PRN

## 2016-07-29 MED ORDER — PANTOPRAZOLE SODIUM 40 MG PO TBEC
40.0000 mg | DELAYED_RELEASE_TABLET | Freq: Every day | ORAL | Status: DC
  Administered 2016-07-30 – 2016-08-01 (×3): 40 mg via ORAL
  Filled 2016-07-29 (×3): qty 1

## 2016-07-29 MED ORDER — LEVETIRACETAM 500 MG PO TABS
500.0000 mg | ORAL_TABLET | Freq: Every day | ORAL | Status: DC
  Administered 2016-07-30 – 2016-08-01 (×3): 500 mg via ORAL
  Filled 2016-07-29 (×3): qty 1

## 2016-07-29 MED ORDER — FOLIC ACID 1 MG PO TABS
1.0000 mg | ORAL_TABLET | Freq: Every day | ORAL | Status: DC
  Administered 2016-07-30 – 2016-08-01 (×3): 1 mg via ORAL
  Filled 2016-07-29 (×3): qty 1

## 2016-07-29 NOTE — ED Triage Notes (Signed)
Patient states patient has a rectal temp 102.0. Patient has stage 4 lung cancer with mets to the bone and brain. Patient called EMS for shortness of breathe with O2 Sats 92% on room air. EMS put patient on 3 Liters making O2 Sats 97%.

## 2016-07-29 NOTE — ED Notes (Signed)
Bed: VP71 Expected date:  Expected time:  Means of arrival:  Comments: 65 yo F/ Shortness of breath

## 2016-07-29 NOTE — ED Provider Notes (Signed)
[facility] DEPT Provider Note   CSN: [id number] Arrival date & time: 07/29/16  2123  By signing my name below, I, Hansel Feinstein, attest that this documentation has been prepared under the direction and in the presence of Delos Haring, PA-C. Electronically Signed: Hansel Feinstein, ED Scribe. 07/29/16. 10:13 PM.    History   Chief Complaint Chief Complaint  Patient presents with  . Shortness of Breath   DNR as of 06/10/16 HPI Laura Walter is a 65 y.o. 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 Partner also states the pts HR is higher (130s compared to 120s baseline) and is breathing more rapidly today compared to baseline. Per SO, pt is normally cold-natured, but has been hot to the touch for 3-4 weeks. Pt states she feels more nauseated and tired today compared to baseline. Pt also reports an occasional cough for the last week. Per partner, since starting Zyprexa in 05/2016, pt was doing extremely well prior to a few weeks ago when her condition declined again. Pt denies abdominal pain.  Per oncology notes from 07/22/16:  Pts current therapy is daily PO Tagrisso (started 06/17/16), which she is tolerating well, and Xgeva subcutaneously q4w (since 05/01/15). She was noted to be anemic and have thrombocytopenia on 07/15/16 and was given 2 units packed red blood cells at that time. Her condition improved for 1 day before her fatigue and weakness again worsened. Pt has previously undergone whole brain irradiation, radiotherapy to the lungs, and chemotherapy which was d/c d/t intolerance.  DIAGNOSIS: Stage IV (T2a, N3, M1b) Non-small lung cancer, adenocarcinoma with positive EGFR mutation with deletion in exon 19, presented with large right upper lobe lung mass in addition to bilateral mediastinal and left supraclavicular  lymphadenopathy as well as multiple bone and brain lesions diagnosed in June 2016  Pt is DNR.   The history is provided by the patient and a significant other. No language interpreter was used.    Past Medical History:  Diagnosis Date  . Allergy    seasonal  . Bone cancer ([place]) 01/10/15 PET   lytic lesion lt 1st rib,T9,L3 Lt acetabulum  . Brain cancer ([place]) 01/13/15   MRI multiple small brain mets 10 individual lesions  . Bronchitis   . H/O reactive hypoglycemia   . IBS (irritable bowel syndrome)   . Lung cancer ([place])    stageIV nscca rul  . Osteopenia   . Paronychia 11/17/2015  . Pneumonia 2009 & 2011  . S/P radiation therapy completed 02/08/15   whole brain ,spine  . S/P radiation therapy completed 11/27/15   lung    Patient Active Problem List   Diagnosis Date Noted  . Pleural effusion on right 07/29/2016  . Sepsis ([place]) 07/29/2016  . Symptomatic anemia 06/10/2016  . Sacral ulcer ([place]) 06/10/2016  . Protein-calorie malnutrition, severe 05/15/2016  . Emesis 05/14/2016  . Metastatic cancer ([place])   . Weakness 05/11/2016  . Aphasia 03/12/2016  . Pain management 03/09/2016  . Dehydration 03/09/2016  . 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 Brain metastases ([place]) 02/14/2016  . Paronychia 11/17/2015  . Paronychia of great toe, right 08/21/2015  . Paronychia of great toe, left 08/21/2015  . Nausea without vomiting 04/24/2015  . Feeding difficulties 04/11/2015  . Encounter for antineoplastic chemotherapy 03/06/2015  . Hypokalemia 02/15/2015  . Protein-calorie malnutrition ([place]) 02/15/2015  .  Neutropenic fever ([place]) 02/11/2015  . Neutropenia with fever ([place]) 02/11/2015  . Bicytopenia 02/11/2015  . Diarrhea 02/11/2015  . Malignant neoplasm of upper lobe of right lung ([place]) 01/05/2015  . 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 Metastasis to bone ([place]) 01/03/2015  . Physical exam 10/13/2014  . Osteopenia 10/07/2013  . Benign positional vertigo 05/08/2012  . TINNITUS, LEFT  01/09/2009  . CARDIAC MURMUR, AORTIC 01/05/2007  . MOTION SICKNESS 01/05/2007    Past Surgical History:  Procedure Laterality Date  . COLONOSCOPY  2009   Negative; [facility]  . ESOPHAGOGASTRODUODENOSCOPY (EGD) WITH PROPOFOL N/A 04/11/2015   Procedure: ESOPHAGOGASTRODUODENOSCOPY (EGD) WITH PROPOFOL;  Surgeon: Carol Ada, MD;  Location: [facility] ENDOSCOPY;  Service: Endoscopy;  Laterality: N/A;  . PEG PLACEMENT N/A 04/11/2015   Procedure: PERCUTANEOUS ENDOSCOPIC GASTROSTOMY (PEG) PLACEMENT;  Surgeon: Carol Ada, MD;  Location: [facility] ENDOSCOPY;  Service: Endoscopy;  Laterality: N/A;  . TONSILLECTOMY AND ADENOIDECTOMY    . WISDOM TOOTH EXTRACTION      OB History    No data available       Home Medications    Prior to Admission medications   Medication Sig Start Date End Date Taking? Authorizing Provider  Amino Acids-Protein Hydrolys (FEEDING SUPPLEMENT, PRO-STAT SUGAR FREE 64,) LIQD Take 30 mLs by mouth 2 (two) times daily. 05/16/16  Yes Hosie Poisson, MD  FeFum-FePoly-FA-B Cmp-C-Biot (INTEGRA PLUS) CAPS Take 1 capsule by mouth every morning. 07/24/16  Yes Curt Bears, MD  folic acid (FOLVITE) 1 MG tablet Take 1 mg by mouth daily. 07/07/16  Yes Historical Provider, MD  HYDROcodone-acetaminophen (NORCO) 5-325 MG tablet Take 1-2 tablets by mouth every 4 (four) hours as needed for moderate pain. 02/14/16  Yes Hayden Pedro, PA-C  levETIRAcetam (KEPPRA) 500 MG tablet 1 tablet in the morning and 1.5 tablets in the evening 06/26/16  Yes Kathrynn Ducking, MD  Nutritional Supplements (FEEDING SUPPLEMENT, OSMOLITE 1.5 CAL,) LIQD Place 237 mLs into feeding tube 4 (four) times daily. Patient taking differently: Place 237 mLs into feeding tube 3 (three) times daily.  04/14/15  Yes Carol Ada, MD  OLANZapine (ZYPREXA) 10 MG tablet TAKE 1 TABLET (10 MG TOTAL) BY MOUTH AT BEDTIME. (INS PAYS 12/13/2015) 05/24/16  Yes Curt Bears, MD  omeprazole (PRILOSEC) 20 MG capsule Take 1 capsule (20 mg  total) by mouth daily. 07/22/16  Yes Maryanna Shape, NP  osimertinib mesylate (TAGRISSO) 80 MG tablet Take 1 tablet (80 mg total) by mouth daily. 05/29/16  Yes Curt Bears, MD  polyethylene glycol [facility] / GLYCOLAX) packet Take 17 g by mouth daily as needed. Patient taking differently: Take 17 g by mouth daily as needed for mild constipation.  05/16/16  Yes Hosie Poisson, MD  senna-docusate (SENOKOT-S) 8.6-50 MG tablet Take 1 tablet by mouth 2 (two) times daily. 07/03/16  Yes Curt Bears, MD  traMADol (ULTRAM) 50 MG tablet Take 1 tablet (50 mg total) by mouth every 6 (six) hours as needed. Patient taking differently: Take 50 mg by mouth every 6 (six) hours as needed for moderate pain.  02/19/16  Yes Curt Bears, MD    Family History Family History  Problem Relation Age of Onset  . Transient ischemic attack Mother   . Mental illness Mother     Dementia  . Diabetes Father   . Heart attack Father 65  . Heart attack Maternal Grandfather     in 28s  . Stomach cancer Maternal Aunt   . Heart attack Maternal Aunt  in 58s  . Diabetes Paternal Uncle     X 3    Social History Social History  Substance Use Topics  . Smoking status: Never Smoker  . Smokeless tobacco: Never Used  . Alcohol use Yes     Comment: occasionally rare wine     Allergies   Sulfonamide derivatives   Review of Systems Review of Systems  Constitutional: Positive for fatigue and fever.  Respiratory: Positive for cough.   Gastrointestinal: Positive for nausea. Negative for abdominal pain.  All other systems reviewed and are negative.    Physical Exam Updated Vital Signs BP 103/69 (BP Location: Left Arm)   Pulse 116   Temp 100.9 F (38.3 C) (Rectal)   Resp (!) 43   Ht 5' 6"  (1.676 m)   Wt 59 kg   SpO2 100%   BMI 20.98 kg/m   Physical Exam  Constitutional:  lethargic  HENT:  Head: Normocephalic.  Eyes: Conjunctivae are normal.  Cardiovascular: Tachycardia present.     Pulmonary/Chest: Tachypnea noted. No respiratory distress. She has decreased breath sounds (right lung worse than left). She has no wheezes. She has rhonchi.  Abdominal: She exhibits no distension.  Musculoskeletal: Normal range of motion.  Neurological: She is alert.  Skin: Skin is warm and dry.  +dry  Psychiatric: She has a normal mood and affect. Her behavior is normal.  Nursing note and vitals reviewed.    ED Treatments / Results   DIAGNOSTIC STUDIES: Oxygen Saturation is 98% on [place] 2L, normal by my interpretation.    COORDINATION OF CARE: 9:58 PM Discussed treatment plan with pt at bedside which includes labs, CXR and pt agreed to plan.    Labs (all labs ordered are listed, but only abnormal results are displayed) Labs Reviewed  COMPREHENSIVE METABOLIC PANEL - Abnormal; Notable for the following:       Result Value   Glucose, Bld 126 (*)    BUN 47 (*)    Creatinine, Ser 1.03 (*)    Calcium 8.0 (*)    Total Protein 6.0 (*)    Albumin 2.2 (*)    AST 67 (*)    ALT 68 (*)    GFR calc non Af Amer 57 (*)    All other components within normal limits  CBC WITH DIFFERENTIAL/PLATELET - Abnormal; Notable for the following:    RBC 2.83 (*)    Hemoglobin 8.2 (*)    HCT 25.9 (*)    RDW 18.9 (*)    Platelets 41 (*)    Lymphs Abs 0.4 (*)    All other components within normal limits  I-STAT CG4 LACTIC ACID, ED - Abnormal; Notable for the following:    Lactic Acid, Venous 2.09 (*)    All other components within normal limits  CULTURE, BLOOD (ROUTINE X 2)  CULTURE, BLOOD (ROUTINE X 2)  URINE CULTURE  URINALYSIS, ROUTINE W REFLEX MICROSCOPIC    EKG  EKG Interpretation  Date/Time:  Monday July 29 2016 21:46:25 EST Ventricular Rate:  131 PR Interval:    QRS Duration: 72 QT Interval:  311 QTC Calculation: 460 R Axis:   -40 Text Interpretation:  Sinus tachycardia Left axis deviation Low voltage, precordial leads since last tracing no significant change Confirmed by  BELFI  MD, MELANIE ([id number]) on 07/29/2016 9:52:30 PM       Radiology Dg Chest Port 1 View  Result Date: 07/29/2016 CLINICAL DATA:  Tachycardia and tachypnea. Shortness of breath. Metastatic lung cancer. EXAM: PORTABLE CHEST 1  VIEW COMPARISON:  Radiograph 06/09/2016, chest CT 05/11/2016 FINDINGS: Progressive opacification of the right hemithorax, no with diffuse hazy opacity throughout. More confluent opacity in the periphery of the right upper lobe. Normal heart size. Minimal blunting of left costophrenic angle, may be small left effusion. Left lung is otherwise clear. Bony metastatic disease not as well visualized currently. IMPRESSION: Progressive hazy opacification of right hemithorax, likely layering pleural effusion. Right apical masslike opacity again seen. Probable small left pleural effusion. Electronically Signed   By: Jeb Levering M.D.   On: 07/29/2016 22:38    Procedures Procedures (including critical care time)  Medications Ordered in ED Medications  vancomycin (VANCOCIN) IVPB 1000 mg/200 mL premix (1,000 mg Intravenous New Bag/Given 07/29/16 2301)  polyethylene glycol (MIRALAX / GLYCOLAX) packet 17 g (not administered)  senna-docusate (Senokot-S) tablet 1 tablet (not administered)  traMADol (ULTRAM) tablet 50 mg (not administered)  HYDROcodone-acetaminophen (NORCO/VICODIN) 5-325 MG per tablet 1-2 tablet (not administered)  feeding supplement (OSMOLITE 1.5 CAL) liquid 237 mL (not administered)  pantoprazole (PROTONIX) EC tablet 40 mg (not administered)  OLANZapine (ZYPREXA) tablet 10 mg (not administered)  folic acid (FOLVITE) tablet 1 mg (not administered)  INTEGRA PLUS CAPS 1 capsule (not administered)  feeding supplement (PRO-STAT SUGAR FREE 64) liquid 30 mL (not administered)  levETIRAcetam (KEPPRA) tablet 750 mg (not administered)  levETIRAcetam (KEPPRA) tablet 500 mg (not administered)  0.9 %  sodium chloride infusion (not administered)  sodium chloride 0.9 % bolus  1,000 mL (1,000 mLs Intravenous New Bag/Given 07/29/16 2250)  sodium chloride 0.9 % bolus 1,000 mL (1,000 mLs Intravenous New Bag/Given 07/29/16 2259)    And  sodium chloride 0.9 % bolus 1,000 mL (1,000 mLs Intravenous New Bag/Given 07/29/16 2312)  ceFEPIme (MAXIPIME) 2 g in dextrose 5 % 50 mL IVPB (2 g Intravenous New Bag/Given 07/29/16 2300)     Initial Impression / Assessment and Plan / ED Course  I have reviewed the triage vital signs and the nursing notes.  Pertinent labs & imaging results that were available during my care of the patient were reviewed by me and considered in my medical decision making (see chart for details).  Clinical Course    10: 45 pm Called a CODE SEPSIS per Dr. Tamera Punt, pt has postobstructive pneumonia. Ordered HCAP pneumonia order set. Dr. Alcario Drought to admit patient.  CRITICAL CARE Performed by: Linus Mako Total critical care time: 45 minutes Critical care time was exclusive of separately billable procedures and treating other patients. Critical care was necessary to treat or prevent imminent or life-threatening deterioration. Critical care was time spent personally by me on the following activities: development of treatment plan with patient and/or surrogate as well as nursing, discussions with consultants, evaluation of patient's response to treatment, examination of patient, obtaining history from patient or surrogate, ordering and performing treatments and interventions, ordering and review of laboratory studies, ordering and review of radiographic studies, pulse oximetry and re-evaluation of patient's condition.   Final Clinical Impressions(s) / ED Diagnoses   Final diagnoses:  Sepsis due to pneumonia [facility])    New Prescriptions Current Discharge Medication List      I personally performed the services described in this documentation, which was scribed in my presence. The recorded information has been reviewed and is accurate.    Delos Haring, PA-C 07/29/16 2303    Delos Haring, PA-C 07/29/16 8891    Malvin Johns, MD 07/30/16 [phone]

## 2016-07-29 NOTE — ED Notes (Signed)
Requested patient to urinate. 

## 2016-07-30 ENCOUNTER — Encounter (HOSPITAL_COMMUNITY): Payer: Self-pay

## 2016-07-30 ENCOUNTER — Emergency Department (HOSPITAL_COMMUNITY): Payer: BC Managed Care – PPO

## 2016-07-30 DIAGNOSIS — C761 Malignant neoplasm of thorax: Secondary | ICD-10-CM | POA: Diagnosis present

## 2016-07-30 DIAGNOSIS — D6959 Other secondary thrombocytopenia: Secondary | ICD-10-CM

## 2016-07-30 DIAGNOSIS — J9690 Respiratory failure, unspecified, unspecified whether with hypoxia or hypercapnia: Secondary | ICD-10-CM | POA: Diagnosis not present

## 2016-07-30 DIAGNOSIS — J9601 Acute respiratory failure with hypoxia: Secondary | ICD-10-CM | POA: Diagnosis present

## 2016-07-30 DIAGNOSIS — L8990 Pressure ulcer of unspecified site, unspecified stage: Secondary | ICD-10-CM | POA: Insufficient documentation

## 2016-07-30 DIAGNOSIS — Z515 Encounter for palliative care: Secondary | ICD-10-CM

## 2016-07-30 DIAGNOSIS — D6481 Anemia due to antineoplastic chemotherapy: Secondary | ICD-10-CM

## 2016-07-30 DIAGNOSIS — Z923 Personal history of irradiation: Secondary | ICD-10-CM | POA: Diagnosis not present

## 2016-07-30 DIAGNOSIS — R000 Tachycardia, unspecified: Secondary | ICD-10-CM | POA: Diagnosis present

## 2016-07-30 DIAGNOSIS — Z7189 Other specified counseling: Secondary | ICD-10-CM

## 2016-07-30 DIAGNOSIS — J189 Pneumonia, unspecified organism: Secondary | ICD-10-CM | POA: Diagnosis present

## 2016-07-30 DIAGNOSIS — C3490 Malignant neoplasm of unspecified part of unspecified bronchus or lung: Secondary | ICD-10-CM

## 2016-07-30 DIAGNOSIS — L89153 Pressure ulcer of sacral region, stage 3: Secondary | ICD-10-CM | POA: Diagnosis present

## 2016-07-30 DIAGNOSIS — D696 Thrombocytopenia, unspecified: Secondary | ICD-10-CM | POA: Diagnosis present

## 2016-07-30 DIAGNOSIS — Z79899 Other long term (current) drug therapy: Secondary | ICD-10-CM | POA: Diagnosis not present

## 2016-07-30 DIAGNOSIS — A419 Sepsis, unspecified organism: Secondary | ICD-10-CM | POA: Diagnosis present

## 2016-07-30 DIAGNOSIS — D630 Anemia in neoplastic disease: Secondary | ICD-10-CM

## 2016-07-30 DIAGNOSIS — J869 Pyothorax without fistula: Secondary | ICD-10-CM | POA: Diagnosis present

## 2016-07-30 DIAGNOSIS — C3411 Malignant neoplasm of upper lobe, right bronchus or lung: Secondary | ICD-10-CM

## 2016-07-30 DIAGNOSIS — J90 Pleural effusion, not elsewhere classified: Secondary | ICD-10-CM | POA: Diagnosis present

## 2016-07-30 DIAGNOSIS — T451X5A Adverse effect of antineoplastic and immunosuppressive drugs, initial encounter: Secondary | ICD-10-CM | POA: Diagnosis present

## 2016-07-30 DIAGNOSIS — R0602 Shortness of breath: Secondary | ICD-10-CM | POA: Diagnosis present

## 2016-07-30 DIAGNOSIS — Z66 Do not resuscitate: Secondary | ICD-10-CM | POA: Diagnosis present

## 2016-07-30 DIAGNOSIS — C7951 Secondary malignant neoplasm of bone: Secondary | ICD-10-CM | POA: Diagnosis present

## 2016-07-30 DIAGNOSIS — C7931 Secondary malignant neoplasm of brain: Secondary | ICD-10-CM | POA: Diagnosis present

## 2016-07-30 DIAGNOSIS — Z79891 Long term (current) use of opiate analgesic: Secondary | ICD-10-CM | POA: Diagnosis not present

## 2016-07-30 DIAGNOSIS — Z882 Allergy status to sulfonamides status: Secondary | ICD-10-CM | POA: Diagnosis not present

## 2016-07-30 LAB — STREP PNEUMONIAE URINARY ANTIGEN: Strep Pneumo Urinary Antigen: NEGATIVE

## 2016-07-30 LAB — URINALYSIS, ROUTINE W REFLEX MICROSCOPIC
BILIRUBIN URINE: NEGATIVE
Glucose, UA: NEGATIVE mg/dL
HGB URINE DIPSTICK: NEGATIVE
Ketones, ur: NEGATIVE mg/dL
LEUKOCYTES UA: NEGATIVE
NITRITE: NEGATIVE
PROTEIN: NEGATIVE mg/dL
Specific Gravity, Urine: 1.02 (ref 1.005–1.030)
pH: 5 (ref 5.0–8.0)

## 2016-07-30 LAB — BASIC METABOLIC PANEL
Anion gap: 8 (ref 5–15)
BUN: 36 mg/dL — AB (ref 6–20)
CALCIUM: 7.1 mg/dL — AB (ref 8.9–10.3)
CO2: 19 mmol/L — AB (ref 22–32)
CREATININE: 0.75 mg/dL (ref 0.44–1.00)
Chloride: 114 mmol/L — ABNORMAL HIGH (ref 101–111)
GFR calc Af Amer: >60 mL/min (ref >60)
GLUCOSE: 103 mg/dL — AB (ref 65–99)
Potassium: 3.8 mmol/L (ref 3.5–5.1)
Sodium: 141 mmol/L (ref 135–145)

## 2016-07-30 LAB — CBC
HEMATOCRIT: 22.8 % — AB (ref 36.0–46.0)
Hemoglobin: 7.2 g/dL — ABNORMAL LOW (ref 12.0–15.0)
MCH: 29.1 pg (ref 26.0–34.0)
MCHC: 31.6 g/dL (ref 30.0–36.0)
MCV: 92.3 fL (ref 78.0–100.0)
PLATELETS: 31 10*3/uL — AB (ref 150–400)
RBC: 2.47 MIL/uL — ABNORMAL LOW (ref 3.87–5.11)
RDW: 18.9 % — AB (ref 11.5–15.5)
WBC: 6.8 10*3/uL (ref 4.0–10.5)

## 2016-07-30 LAB — LACTIC ACID, PLASMA
Lactic Acid, Venous: 0.8 mmol/L (ref 0.5–1.9)
Lactic Acid, Venous: 0.8 mmol/L (ref 0.5–1.9)

## 2016-07-30 LAB — MRSA PCR SCREENING: MRSA by PCR: NEGATIVE

## 2016-07-30 MED ORDER — VANCOMYCIN HCL 500 MG IV SOLR
500.0000 mg | Freq: Two times a day (BID) | INTRAVENOUS | Status: DC
  Administered 2016-07-30 – 2016-07-31 (×2): 500 mg via INTRAVENOUS
  Filled 2016-07-30 (×2): qty 500

## 2016-07-30 MED ORDER — ORAL CARE MOUTH RINSE: 15.0000 mL | Freq: Two times a day (BID) | OROMUCOSAL | Status: DC

## 2016-07-30 MED ORDER — DEXTROSE 5 % IV SOLN
2.0000 g | Freq: Two times a day (BID) | INTRAVENOUS | Status: DC
  Administered 2016-07-30 (×2): 2 g via INTRAVENOUS
  Filled 2016-07-30 (×3): qty 2

## 2016-07-30 MED ORDER — CHLORHEXIDINE GLUCONATE 0.12 % MT SOLN
15.0000 mL | Freq: Two times a day (BID) | OROMUCOSAL | Status: DC
  Administered 2016-07-30 – 2016-07-31 (×2): 15 mL via OROMUCOSAL
  Filled 2016-07-30 (×3): qty 15

## 2016-07-30 MED ORDER — SODIUM CHLORIDE 0.9 % IV BOLUS (SEPSIS)
500.0000 mL | Freq: Once | INTRAVENOUS | Status: AC
  Administered 2016-07-30: 500 mL via INTRAVENOUS

## 2016-07-30 MED ORDER — SODIUM CHLORIDE 0.9 % IV BOLUS (SEPSIS)
250.0000 mL | Freq: Once | INTRAVENOUS | Status: AC
  Administered 2016-07-30: 250 mL via INTRAVENOUS

## 2016-07-30 MED ORDER — SODIUM CHLORIDE 0.9 % IV BOLUS (SEPSIS): 250.0000 mL | INTRAVENOUS | Status: DC | PRN

## 2016-07-30 MED ORDER — LIP MEDEX EX OINT
TOPICAL_OINTMENT | CUTANEOUS | Status: AC
  Filled 2016-07-30: qty 7

## 2016-07-30 MED ORDER — SODIUM CHLORIDE 0.9 % IV SOLN
750.0000 mg | Freq: Two times a day (BID) | INTRAVENOUS | Status: AC
  Administered 2016-07-30: 750 mg via INTRAVENOUS
  Filled 2016-07-30: qty 7.5

## 2016-07-30 NOTE — Progress Notes (Signed)
Palliative consult received.   Stopped by patient's room. Friend at bedside stated patient's HCPOA had just left. Family member and friend asked me not to call HCPOA at this moment as she had left to get some rest. They agreed to give HCPOA my contact information and state she will contact me when she returns to arrange meeting.   Mariana Kaufman, AGNP-C Palliative Medicine  Please call Palliative Medicine team phone with any questions [phone]. For individual providers please see AMION.

## 2016-07-30 NOTE — Progress Notes (Signed)
Pharmacy Antibiotic Note  APRILLE SAWHNEY is a 65 y.o. female admitted on 07/29/2016 with pneumonia and sepsis.  Pharmacy has been consulted for Vancomycin, cefepime  dosing.  Plan: Vancomycin '500mg'$  IV every 12 hours.  Goal trough 15-20 mcg/mL.  Cefepime 2gm iv q12hr  Height: '5\' 6"'$  (167.6 cm) Weight: 130 lb (59 kg) IBW/kg (Calculated) : 59.3  Temp (24hrs), Avg:99.9 F (37.7 C), Min:98.9 F (37.2 C), Max:100.9 F (38.3 C)   Recent Labs Lab 07/29/16 2236 07/29/16 2251  WBC 7.6  --   CREATININE 1.03*  --   LATICACIDVEN  --  2.09*    Estimated Creatinine Clearance: 52.1 mL/min (by C-G formula based on SCr of 1.03 mg/dL (H)).    Allergies  Allergen Reactions  . Sulfonamide Derivatives Rash    Childhood reaction    Antimicrobials this admission: Vancomycin 07/31/2016 >> Cefepime 07/31/2016 >>   Dose adjustments this admission: -  Microbiology results: pending  Thank you for allowing pharmacy to be a part of this patient's care.  [name] 07/30/2016 3:36 AM

## 2016-07-30 NOTE — Progress Notes (Signed)
Notified MD of blood pressures with systolic in the 10'U. Will continue to monitor patient closely.

## 2016-07-30 NOTE — Consult Note (Signed)
Consultation Note Date: 07/30/2016   Patient Name: Laura Walter  DOB: Dec 14, 1950  MRN: [id number]  Age / Sex: 65 y.o., female  PCP: Midge Minium, MD Referring Physician: Reyne Dumas, MD  Reason for Consultation: Establishing goals of care  HPI/Patient Profile: 65 y.o. female  with past medical history of lung cancer (Stage IV, [place], met to brain and bone, s/p multiple lines of chemo and s/p radiation, CT now indicating likely lymphangitic spread) admitted on 07/29/2016 with fever and increasing fatigue that progressed to respiratory failure. Workup reveals pneumonia and worsening lung cancer.  Clinical Assessment and Goals of Care: Met with patient's wife and HCPOA, Teodoro Spray. Patient had been doing well with good response to chemotherapy until recently. However, [name] notes patient's level of functioning has declined significantly since last year and even more so in the last 2 weeks. She is unable to swallow food, she had a PEG placed last October. She has become increasingly easily fatigued, and was sleeping in a hospital bed at home. [name] was assisting with ADL's.  [name] notes that patient does have living will and desires no extraordinary measures. For now [name] notes GOC as discussing patient's status with Dr. Julien Nordmann. If he indicates that further chemotherapy treatment is not warranted (which is [name]'s expectation of what he will say); then she feels that comfort care and transition to hospice would best meet patient's goals of care.   Primary Decision Maker HCPOA - Teodoro Spray    SUMMARY OF RECOMMENDATIONS -DNR -Continue current level of care -[name] to discuss chemotherapy options with oncology -PMT will follow up with [name] tomorrow after she speaks with oncology    Code Status/Advance Care Planning:  DNR  Psycho-social/Spiritual:   Desire for further Chaplaincy  support:No  Prognosis:    < 2 weeks d/t progressive lung ca with lymphangitic spread, sepsis  Discharge Planning: To Be Determined  Primary Diagnoses: Present on Admission: . Pleural effusion on right . Sepsis ([place]) . Malignant neoplasm of upper lobe of right lung ([place]) . Anemia in neoplastic disease . Thrombocytopenia ([place])   I have reviewed the medical record, interviewed the patient and family, and examined the patient. The following aspects are pertinent.  Past Medical History:  Diagnosis Date  . Allergy    seasonal  . Bone cancer ([place]) 01/10/15 PET   lytic lesion lt 1st rib,T9,L3 Lt acetabulum  . Brain cancer ([place]) 01/13/15   MRI multiple small brain mets 10 individual lesions  . Bronchitis   . H/O reactive hypoglycemia   . IBS (irritable bowel syndrome)   . Lung cancer ([place])    stageIV nscca rul  . Osteopenia   . Paronychia 11/17/2015  . Pneumonia 2009 & 2011  . S/P radiation therapy completed 02/08/15   whole brain ,spine  . S/P radiation therapy completed 11/27/15   lung   Social History   Social History  . Marital status: Single    Spouse name: N/A  . Number of children: 0  . Years of education: Master's  Occupational History  . N/A    Social History Main Topics  . Smoking status: Never Smoker  . Smokeless tobacco: Never Used  . Alcohol use Yes     Comment: occasionally rare wine  . Drug use: No  . Sexual activity: No   Other Topics Concern  . None   Social History Narrative   Lives at home w/ her friend, [name]   Right-handed   Caffeine: rare   Family History  Problem Relation Age of Onset  . Transient ischemic attack Mother   . Mental illness Mother     Dementia  . Diabetes Father   . Heart attack Father 23  . Heart attack Maternal Grandfather     in 39s  . Stomach cancer Maternal Aunt   . Heart attack Maternal Aunt     in 7s  . Diabetes Paternal Uncle     X 3   Scheduled Meds: . ceFEPime (MAXIPIME) IV  2 g Intravenous Q12H  .  chlorhexidine  15 mL Mouth Rinse BID  . feeding supplement (OSMOLITE 1.5 CAL)  237 mL Per Tube TID  . feeding supplement (PRO-STAT SUGAR FREE 64)  30 mL Oral BID  . folic acid  1 mg Oral Daily  . levETIRAcetam  500 mg Oral Daily  . levETIRAcetam  750 mg Oral QHS  . lip balm      . mouth rinse  15 mL Mouth Rinse q12n4p  . multivitamin with minerals  1 tablet Oral q morning - 10a  . OLANZapine  10 mg Oral QHS  . pantoprazole  40 mg Oral Daily  . senna-docusate  1 tablet Oral BID  . vancomycin  500 mg Intravenous Q12H   Continuous Infusions: . sodium chloride 125 mL/hr at 07/30/16 0811   PRN Meds:.HYDROcodone-acetaminophen, polyethylene glycol, sodium chloride, traMADol Medications Prior to Admission:  Prior to Admission medications   Medication Sig Start Date End Date Taking? Authorizing Provider  Amino Acids-Protein Hydrolys (FEEDING SUPPLEMENT, PRO-STAT SUGAR FREE 64,) LIQD Take 30 mLs by mouth 2 (two) times daily. 05/16/16  Yes Hosie Poisson, MD  FeFum-FePoly-FA-B Cmp-C-Biot (INTEGRA PLUS) CAPS Take 1 capsule by mouth every morning. 07/24/16  Yes Curt Bears, MD  folic acid (FOLVITE) 1 MG tablet Take 1 mg by mouth daily. 07/07/16  Yes Historical Provider, MD  HYDROcodone-acetaminophen (NORCO) 5-325 MG tablet Take 1-2 tablets by mouth every 4 (four) hours as needed for moderate pain. 02/14/16  Yes Hayden Pedro, PA-C  levETIRAcetam (KEPPRA) 500 MG tablet 1 tablet in the morning and 1.5 tablets in the evening 06/26/16  Yes Kathrynn Ducking, MD  Nutritional Supplements (FEEDING SUPPLEMENT, OSMOLITE 1.5 CAL,) LIQD Place 237 mLs into feeding tube 4 (four) times daily. Patient taking differently: Place 237 mLs into feeding tube 3 (three) times daily.  04/14/15  Yes Carol Ada, MD  OLANZapine (ZYPREXA) 10 MG tablet TAKE 1 TABLET (10 MG TOTAL) BY MOUTH AT BEDTIME. (INS PAYS 12/13/2015) 05/24/16  Yes Curt Bears, MD  omeprazole (PRILOSEC) 20 MG capsule Take 1 capsule (20 mg total)  by mouth daily. 07/22/16  Yes Maryanna Shape, NP  osimertinib mesylate (TAGRISSO) 80 MG tablet Take 1 tablet (80 mg total) by mouth daily. 05/29/16  Yes Curt Bears, MD  polyethylene glycol [facility] / GLYCOLAX) packet Take 17 g by mouth daily as needed. Patient taking differently: Take 17 g by mouth daily as needed for mild constipation.  05/16/16  Yes Hosie Poisson, MD  senna-docusate (SENOKOT-S) 8.6-50 MG tablet  Take 1 tablet by mouth 2 (two) times daily. 07/03/16  Yes Curt Bears, MD  traMADol (ULTRAM) 50 MG tablet Take 1 tablet (50 mg total) by mouth every 6 (six) hours as needed. Patient taking differently: Take 50 mg by mouth every 6 (six) hours as needed for moderate pain.  02/19/16  Yes Curt Bears, MD   Allergies  Allergen Reactions  . Sulfonamide Derivatives Rash    Childhood reaction   Review of Systems  Unable to perform ROS: Acuity of condition    Physical Exam  Constitutional: She appears well-developed and well-nourished.  Cardiovascular:  Tachycardic   Pulmonary/Chest: She has rales.  bipap in place  Abdominal: Soft. Bowel sounds are normal.  Neurological:  Arouses but very somnolent   Skin: Skin is warm and dry.  Psychiatric: She has a normal mood and affect.  Nursing note and vitals reviewed.   Vital Signs: BP (!) 82/54   Pulse (!) 107   Temp 97.4 F (36.3 C) (Rectal)   Resp (!) 38   Ht _0  (1.676 m)   Wt 63.4 kg (139 lb 12.4 oz)   SpO2 99%   BMI 22.56 kg/m  Pain Assessment: No/denies pain   Pain Score: 0-No pain   SpO2: SpO2: 99 % O2 Device:SpO2: 99 % O2 Flow Rate: .O2 Flow Rate (L/min): 3 L/min  IO: Intake/output summary:  Intake/Output Summary (Last 24 hours) at 07/30/16 1457 Last data filed at 07/30/16 1019  Gross per 24 hour  Intake             5525 ml  Output                0 ml  Net             5525 ml    LBM: Last BM Date: 07/30/16 Baseline Weight: Weight: 59 kg (130 lb) Most recent weight: Weight: 63.4 kg (139 lb  12.4 oz)     Palliative Assessment/Data: PPS: 10%     Thank you for this consult. Palliative medicine will continue to follow and assist as needed.   Time In: 1510 Time Out: 1400 Time Total: 70 mins Greater than 50%  of this time was spent counseling and coordinating care related to the above assessment and plan.  Signed by: Mariana Kaufman, AGNP-C Palliative Medicine    Please contact Palliative Medicine Team phone at [phone] for questions and concerns.  For individual provider: See Shea Evans

## 2016-07-30 NOTE — Progress Notes (Signed)
DIAGNOSIS: Stage IV (T2a, N3, M1b) Non-small lung cancer, adenocarcinoma with positive EGFR mutation with deletion in exon 19, presented with large right upper lobe lung mass in addition to bilateral mediastinal and left supraclavicular lymphadenopathy as well as multiple bone and brain lesions diagnosed in June 2016  PRIOR THERAPY:  1) Whole brain irradiation under the care of Dr. Lisbeth Renshaw. 2) Gilotrif 40 mg by mouth daily. Status post 5 months of treatment. 3) stereotactic radiotherapy to the enlarging right upper lobe lung mass under the care of Dr. Lisbeth Renshaw. 4) stereotactic radiotherapy to another right upper lobe lung mass as well as solitary brain metastases. 5) Gilotrif 30 mg by mouth daily. First dose started 08/31/2015. Status post 6 months of treatment, discontinued secondary to disease progression. 6) palliative radiotherapy to the progressive lesion in the right side of the chest as well as the metastatic bone lesion involving C6 and C7 vertebrae with epidural tumor in the lateral recess as well as palliative radiotherapy to T8-the in the spine, under the care of Dr. Lisbeth Renshaw completed on 03/04/2016. 7) Systemic chemotherapy with carboplatin for AUC of 5, Alimta 500 MG/M2 and Ketruda (pembrolizumab) 200 MG/M2 every 3 weeks. First dose 03/13/2016. Status post 2 cycles discontinued secondary to intolerance and severe thrombocytopenia.   CURRENT THERAPY:  1) Tagrisso 80 mg by mouth daily started 06/17/2016. 2) Xgeva 120 mg subcutaneously every 4 weeks. First dose 05/01/2015.  Subjective: The patient is seen and examined today. Her brother, her partner, Laura Walter and another friend were at the bedside. She is a very pleasant 65 years old white female was diagnosed with stage IV non-small cell lung cancer in June 2016 with positive EGFR mutation at that time. She underwent several treatments including oral tyrosine kinase inhibitor with Gilotrif, palliative radiotherapy to multiple brain lesion in  addition to a stereotactic radiotherapy to the progressive lung masses as well as metastatic bone disease. She also underwent systemic chemotherapy in combination with immunotherapy with Ketruda (pembrolizumab) discontinued secondary to intolerance. She was started on third generation tyrosine kinase inhibitor with Tagrisso but unfortunately she has evidence for disease progression on this treatment. She was admitted to [facility] intensive care unit yesterday with significant shortness of breath and sepsis. Her temperature at home most 102. The patient has been complaining of increasing fatigue and weakness. She was also tachycardic. CT scan of the chest performed earlier today showed progressive malignancy in the thorax with multiple new bilateral pulmonary nodules and increase in the size of moderate right pleural effusion as well as a small left pleural effusion. There was also suspicious lymphadenopathy in the abdomen. The patient is not feeling well today. She continues to have the significant fatigue and weakness as well as the shortness of breath.  Objective: Vital signs in last 24 hours: Temp:  [97.4 F (36.3 C)-100.9 F (38.3 C)] 97.4 F (36.3 C) (12/26 0800) Pulse Rate:  [106-131] 111 (12/26 1500) Resp:  [25-55] 55 (12/26 1500) BP: (80-118)/(48-75) 89/56 (12/26 1500) SpO2:  [91 %-100 %] 96 % (12/26 1500) Weight:  [130 lb (59 kg)-139 lb 12.4 oz (63.4 kg)] 139 lb 12.4 oz (63.4 kg) (12/26 0305)  Intake/Output from previous day: 12/25 0701 - 12/26 0700 In: 4300 [I.V.:450; IV Piggyback:3850] Out: -  Intake/Output this shift: Total I/O In: 1810.4 [I.V.:1710.4; NG/GT:50; IV Piggyback:50] Out: 200 [Urine:200]  General appearance: alert, cooperative, fatigued and mild distress Resp: rales bilaterally Cardio: Tachycardic GI: soft, non-tender; bowel sounds normal; no masses,  no organomegaly Extremities: extremities normal,  atraumatic, no cyanosis or edema  Lab Results:    Recent Labs  07/29/16 2236 07/30/16 0340  WBC 7.6 6.8  HGB 8.2* 7.2*  HCT 25.9* 22.8*  PLT 41* 31*   BMET  Recent Labs  07/29/16 2236 07/30/16 0340  NA 140 141  K 4.1 3.8  CL 108 114*  CO2 24 19*  GLUCOSE 126* 103*  BUN 47* 36*  CREATININE 1.03* 0.75  CALCIUM 8.0* 7.1*    Studies/Results: Ct Chest Wo Contrast  Result Date: 07/30/2016 CLINICAL DATA:  Sepsis. Fever, fatigue. Cough for 1 week. History of stage IV lung cancer, ongoing chemotherapy. EXAM: CT CHEST WITHOUT CONTRAST TECHNIQUE: Multidetector CT imaging of the chest was performed following the standard protocol without IV contrast. COMPARISON:  Radiographs yesterday.  Most recent chest CT 05/11/2016 FINDINGS: Cardiovascular: Atherosclerosis of the thoracic aorta. Decreased density of blood pool suggesting anemia. There are coronary artery calcifications. Small to moderate pericardial effusion is new from prior, measure simple fluid density. Largest pericardial fluid volume adjacent to the right heart measures up to 1.5 cm. Mediastinum/Nodes: Soft tissue mass about the distal right clavicle is likely a soft tissue component of an osseous lesion versus less likely adenopathy. There are prominent subcarinal nodes measuring 11 mm, not seen previously. Limited assessment for hilar adenopathy given lack contrast. Lungs/Pleura: Moderate size right pleural effusion, increased from prior exam. There is associated compressive atelectasis of the right lower and to a lesser extent upper lobes. There is a small left pleural effusion. The right apical mass has subjectively increased in size, is partially obscured by adjacent pleural fluid, and exact measurements are difficult. There are multiple new nodules in the right upper lobe, for example paramediastinal nodule measures 1.4 cm. There are multiple new nodules in the right middle and lower lobes, largest measuring 13 mm. There scattered pulmonary nodules throughout the left lung better  also new. There is studding of the left interlobar fissure. There is septal thickening in both upper lobes that has progressed from prior exam. The trachea and central bronchi are patent, with probable radiation change in the right upper lobe bronchus. Upper Abdomen: Unchanged left lobe liver lesion measuring simple fluid density, likely cyst. Second lesion/cyst adjacent the gallbladder fossa is only partially included. A left perinephric nodule is increased in size from prior exam currently 3.1 cm, previously 1.4 cm. Gastrostomy tube in the stomach is partially included. Musculoskeletal: Distal right clavicular lucency with cortical irregularity, associated soft tissue mass. This is not definitively seen on prior. Mixed lytic and sclerotic lesion within T9 vertebral body is grossly unchanged. There is sclerosis involving C7 vertebra, partially included. Sclerotic lesion in the sternum and superior T11 vertebral body is unchanged. Scattered sclerotic rib lesions again seen. IMPRESSION: 1. Progressive malignancy in the thorax. Multiple new bilateral pulmonary nodules in both lungs, more so on the right. Dominant right apical lesion has subjectively increased, partially obscured by surrounding pleural effusion. 2. Increased size of moderate right pleural effusion. Increased small left pleural effusion. 3. Soft tissue density adjacent the distal right clavicle with underlying bony irregularity. Suspect this is a soft tissue component of bone metastasis less likely adenopathy, and appears new from prior exam. Additional bony metastatic disease in the thorax is grossly stable. 4. Increased size of left pararenal nodule suspicious for metastatic disease in the upper abdomen. 5. Upper lobe septal thickening which may reflect pulmonary edema, lymphangitic spread of disease is also considered. This is increased from prior. 6. Small pericardial effusion, new. Electronically  Signed   By: Jeb Levering M.D.   On: 07/30/2016  01:19   Dg Chest Port 1 View  Result Date: 07/29/2016 CLINICAL DATA:  Tachycardia and tachypnea. Shortness of breath. Metastatic lung cancer. EXAM: PORTABLE CHEST 1 VIEW COMPARISON:  Radiograph 06/09/2016, chest CT 05/11/2016 FINDINGS: Progressive opacification of the right hemithorax, no with diffuse hazy opacity throughout. More confluent opacity in the periphery of the right upper lobe. Normal heart size. Minimal blunting of left costophrenic angle, may be small left effusion. Left lung is otherwise clear. Bony metastatic disease not as well visualized currently. IMPRESSION: Progressive hazy opacification of right hemithorax, likely layering pleural effusion. Right apical masslike opacity again seen. Probable small left pleural effusion. Electronically Signed   By: Jeb Levering M.D.   On: 07/29/2016 22:38    Medications: I have reviewed the patient's current medications.  CODE STATUS: No CODE BLUE  Assessment/Plan: 1) metastatic non-small cell lung cancer diagnosed in June 2016 status post multiple systemic regimens including targeted therapy with Gilotrif and Tagrisso. Unfortunately the patient continues to have disease progression and on the previous treatment. I had a lengthy discussion with the patient and her family about her current condition and further treatment options. I explained to the patient that her prognosis is very poor at this point and she may not be a candidate for any further treatment. I strongly recommended for the patient to consider palliative care and hospice at this point. The patient and her family are in agreement with the current plan. 2) chemotherapy-induced anemia and thrombocytopenia: The patient may benefit from 2 units of PRBCs transfusion before discharge. We'll continue to monitor her platelets count closely. 3) acute respiratory failure: Questionable pulmonary edema versus lymphangitic spread of disease. We'll continue supportive care for now. 4)  prognosis: Poor. 5) CODE STATUS: No CODE BLUE Thank you so much for taking good care of Mrs. Griffith, I will continue to follow up the patient with you and assist in her management an as-needed basis.   LOS: 0 days    Laura Walter K. 07/30/2016

## 2016-07-30 NOTE — Consult Note (Signed)
Trainer Nurse wound consult note Reason for Consult: pressure injury Wound type: Sacral Stage 3 Pressure Injury Pressure Ulcer POA: Yes Measurement: 5cm x 1.5cm x 0.2cm  Wound bed: pink, dry Drainage (amount, consistency, odor) minimal, serosanguinous  Periwound: intact  Dressing procedure/placement/frequency: Silicone foam dressing, change every 3 days and PRN soilage. Patient currently on Sport mattress in the ICU for pressure redistribution.   Discussed POC with patient and bedside nurse.  Re consult if needed, will not follow at this time. Thanks  Morris Markham [employer], RN,CWOCN, CNS [phone])

## 2016-07-30 NOTE — H&P (Signed)
History and Physical    Laura Walter QMG:[id number] DOB: Oct 04, 1950 DOA: 07/29/2016   PCP: [name], MD Chief Complaint:  Chief Complaint  Patient presents with  . Shortness of Breath    HPI: Laura Walter is a 65 y.o. 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 Partner also states the pts HR is higher (130s compared to 120s baseline) and is breathing more rapidly today compared to baseline. Per SO, pt is normally cold-natured, but has been hot to the touch for 3-4 weeks. Pt states she feels more nauseated and tired today compared to baseline. Pt also reports an occasional cough for the last week. Per partner, since starting Zyprexa in 05/2016, pt was doing extremely well prior to a few weeks ago when her condition declined again. Pt denies abdominal pain.  Per review of oncology notes, it doesn't sound like she is doing very well from the chemo side of things: 1) Whole brain irradiation under the care of Dr. Lisbeth Renshaw. 2) Gilotrif 40 mg by mouth daily. Status post 5 months of treatment. 3) stereotactic radiotherapy to the enlarging right upper lobe lung mass under the care of Dr. Lisbeth Renshaw. 4) stereotactic radiotherapy to another right upper lobe lung mass as well as solitary brain metastases. 5) Gilotrif 30 mg by mouth daily. First dose started 08/31/2015. Status post 6 months of treatment, discontinued secondary to disease progression. 6) palliative radiotherapy to the progressive lesion in the right side of the chest as well as the metastatic bone lesion involving C6 and C7 vertebrae with epidural tumor in the lateral recess as well as palliative radiotherapy to T8-the in the spine, under the care of Dr. Lisbeth Renshaw completed on 03/04/2016. 7) Systemic chemotherapy with carboplatin for AUC of 5, Alimta  500 MG/M2 and Ketruda (pembrolizumab) 200 MG/M2 every 3 weeks. First dose 03/13/2016. Status post 2 cycles discontinued secondary to intolerance and severe thrombocytopenia.   ED Course: Patient septic, with Tm 100.9, Tachycardic to 130s (baseline of 110s-120s noted per patient and family).  Tachypnea to the 40s.  Review of Systems: As per HPI otherwise 10 point review of systems negative.    Past Medical History:  Diagnosis Date  . Allergy    seasonal  . Bone cancer ([place]) 01/10/15 PET   lytic lesion lt 1st rib,T9,L3 Lt acetabulum  . Brain cancer ([place]) 01/13/15   MRI multiple small brain mets 10 individual lesions  . Bronchitis   . H/O reactive hypoglycemia   . IBS (irritable bowel syndrome)   . Lung cancer ([place])    stageIV nscca rul  . Osteopenia   . Paronychia 11/17/2015  . Pneumonia 2009 & 2011  . S/P radiation therapy completed 02/08/15   whole brain ,spine  . S/P radiation therapy completed 11/27/15   lung    Past Surgical History:  Procedure Laterality Date  . COLONOSCOPY  2009   Negative; [facility]  . ESOPHAGOGASTRODUODENOSCOPY (EGD) WITH PROPOFOL N/A 04/11/2015   Procedure: ESOPHAGOGASTRODUODENOSCOPY (EGD) WITH PROPOFOL;  Surgeon: Carol Ada, MD;  Location: [facility];  Service: Endoscopy;  Laterality: N/A;  . PEG PLACEMENT N/A 04/11/2015   Procedure: PERCUTANEOUS ENDOSCOPIC GASTROSTOMY (PEG) PLACEMENT;  Surgeon: Carol Ada, MD;  Location: [facility];  Service: Endoscopy;  Laterality: N/A;  . TONSILLECTOMY AND ADENOIDECTOMY    . WISDOM TOOTH EXTRACTION  reports that she has never smoked. She has never used smokeless tobacco. She reports that she drinks alcohol. She reports that she does not use drugs.  Allergies  Allergen Reactions  . Sulfonamide Derivatives Rash    Childhood reaction    Family History  Problem Relation Age of Onset  . Transient ischemic attack Mother   . Mental illness Mother     Dementia  . Diabetes Father   . Heart attack Father  50  . Heart attack Maternal Grandfather     in 80s  . Stomach cancer Maternal Aunt   . Heart attack Maternal Aunt     in 42s  . Diabetes Paternal Uncle     X 3      Prior to Admission medications   Medication Sig Start Date End Date Taking? Authorizing Provider  Amino Acids-Protein Hydrolys (FEEDING SUPPLEMENT, PRO-STAT SUGAR FREE 64,) LIQD Take 30 mLs by mouth 2 (two) times daily. 05/16/16  Yes Hosie Poisson, MD  FeFum-FePoly-FA-B Cmp-C-Biot (INTEGRA PLUS) CAPS Take 1 capsule by mouth every morning. 07/24/16  Yes Curt Bears, MD  folic acid (FOLVITE) 1 MG tablet Take 1 mg by mouth daily. 07/07/16  Yes Historical Provider, MD  HYDROcodone-acetaminophen (NORCO) 5-325 MG tablet Take 1-2 tablets by mouth every 4 (four) hours as needed for moderate pain. 02/14/16  Yes Hayden Pedro, PA-C  levETIRAcetam (KEPPRA) 500 MG tablet 1 tablet in the morning and 1.5 tablets in the evening 06/26/16  Yes Kathrynn Ducking, MD  Nutritional Supplements (FEEDING SUPPLEMENT, OSMOLITE 1.5 CAL,) LIQD Place 237 mLs into feeding tube 4 (four) times daily. Patient taking differently: Place 237 mLs into feeding tube 3 (three) times daily.  04/14/15  Yes Carol Ada, MD  OLANZapine (ZYPREXA) 10 MG tablet TAKE 1 TABLET (10 MG TOTAL) BY MOUTH AT BEDTIME. (INS PAYS 12/13/2015) 05/24/16  Yes Curt Bears, MD  omeprazole (PRILOSEC) 20 MG capsule Take 1 capsule (20 mg total) by mouth daily. 07/22/16  Yes Maryanna Shape, NP  osimertinib mesylate (TAGRISSO) 80 MG tablet Take 1 tablet (80 mg total) by mouth daily. 05/29/16  Yes Curt Bears, MD  polyethylene glycol [facility] / GLYCOLAX) packet Take 17 g by mouth daily as needed. Patient taking differently: Take 17 g by mouth daily as needed for mild constipation.  05/16/16  Yes Hosie Poisson, MD  senna-docusate (SENOKOT-S) 8.6-50 MG tablet Take 1 tablet by mouth 2 (two) times daily. 07/03/16  Yes Curt Bears, MD  traMADol (ULTRAM) 50 MG tablet Take 1 tablet  (50 mg total) by mouth every 6 (six) hours as needed. Patient taking differently: Take 50 mg by mouth every 6 (six) hours as needed for moderate pain.  02/19/16  Yes Curt Bears, MD    Physical Exam: Vitals:   07/29/16 2144 07/29/16 2146 07/29/16 2220 07/29/16 2313  BP: 100/64   103/69  Pulse: (!) 131   116  Resp: 25   (!) 43  Temp: 98.9 F (37.2 C)  100.9 F (38.3 C)   TempSrc: Oral  Rectal   SpO2: 98%   100%  Weight:  59 kg (130 lb)    Height:  '5\' 6"'$  (1.676 m)        Constitutional: NAD, calm, comfortable Eyes: PERRL, lids and conjunctivae normal ENMT: Mucous membranes are moist. Posterior pharynx clear of any exudate or lesions.Normal dentition.  Neck: normal, supple, no masses, no thyromegaly Respiratory: Wet sounding cough. Cardiovascular: Tachycardic, regular, no murmurs / rubs / gallops. No extremity edema. 2+  pedal pulses. No carotid bruits. No pulsus paradoxus. Abdomen: no tenderness, no masses palpated. No hepatosplenomegaly. Bowel sounds positive.  Musculoskeletal: no clubbing / cyanosis. No joint deformity upper and lower extremities. Good ROM, no contractures. Normal muscle tone.  Skin: no rashes, lesions, ulcers. No induration Neurologic: CN 2-12 grossly intact. Sensation intact, DTR normal. Strength 5/5 in all 4.  Psychiatric: Normal judgment and insight. Alert and oriented x 3. Normal mood.    Labs on Admission: I have personally reviewed following labs and imaging studies  CBC:  Recent Labs Lab 07/29/16 2236  WBC 7.6  NEUTROABS 6.4  HGB 8.2*  HCT 25.9*  MCV 91.5  PLT 41*   Basic Metabolic Panel:  Recent Labs Lab 07/29/16 2236  NA 140  K 4.1  CL 108  CO2 24  GLUCOSE 126*  BUN 47*  CREATININE 1.03*  CALCIUM 8.0*   GFR: Estimated Creatinine Clearance: 52.1 mL/min (by C-G formula based on SCr of 1.03 mg/dL (H)). Liver Function Tests:  Recent Labs Lab 07/29/16 2236  AST 67*  ALT 68*  ALKPHOS 62  BILITOT 0.3  PROT 6.0*  ALBUMIN  2.2*   No results for input(s): LIPASE, AMYLASE in the last 168 hours. No results for input(s): AMMONIA in the last 168 hours. Coagulation Profile: No results for input(s): INR, PROTIME in the last 168 hours. Cardiac Enzymes: No results for input(s): CKTOTAL, CKMB, CKMBINDEX, TROPONINI in the last 168 hours. BNP (last 3 results) No results for input(s): PROBNP in the last 8760 hours. HbA1C: No results for input(s): HGBA1C in the last 72 hours. CBG: No results for input(s): GLUCAP in the last 168 hours. Lipid Profile: No results for input(s): CHOL, HDL, LDLCALC, TRIG, CHOLHDL, LDLDIRECT in the last 72 hours. Thyroid Function Tests: No results for input(s): TSH, T4TOTAL, FREET4, T3FREE, THYROIDAB in the last 72 hours. Anemia Panel: No results for input(s): VITAMINB12, FOLATE, FERRITIN, TIBC, IRON, RETICCTPCT in the last 72 hours. Urine analysis:    Component Value Date/Time   COLORURINE YELLOW 06/09/2016 2335   APPEARANCEUR CLEAR 06/09/2016 2335   LABSPEC 1.020 06/09/2016 2335   PHURINE 7.0 06/09/2016 2335   GLUCOSEU NEGATIVE 06/09/2016 2335   HGBUR TRACE (A) 06/09/2016 2335   BILIRUBINUR NEGATIVE 06/09/2016 2335   KETONESUR NEGATIVE 06/09/2016 2335   PROTEINUR NEGATIVE 06/09/2016 2335   UROBILINOGEN 1.0 02/11/2015 0748   NITRITE NEGATIVE 06/09/2016 2335   LEUKOCYTESUR NEGATIVE 06/09/2016 2335   Sepsis Labs: '@LABRCNTIP'$ (procalcitonin:4,lacticidven:4) )No results found for this or any previous visit (from the past 240 hour(s)).   Radiological Exams on Admission: Ct Chest Wo Contrast  Result Date: 07/30/2016 CLINICAL DATA:  Sepsis. Fever, fatigue. Cough for 1 week. History of stage IV lung cancer, ongoing chemotherapy. EXAM: CT CHEST WITHOUT CONTRAST TECHNIQUE: Multidetector CT imaging of the chest was performed following the standard protocol without IV contrast. COMPARISON:  Radiographs yesterday.  Most recent chest CT 05/11/2016 FINDINGS: Cardiovascular: Atherosclerosis of  the thoracic aorta. Decreased density of blood pool suggesting anemia. There are coronary artery calcifications. Small to moderate pericardial effusion is new from prior, measure simple fluid density. Largest pericardial fluid volume adjacent to the right heart measures up to 1.5 cm. Mediastinum/Nodes: Soft tissue mass about the distal right clavicle is likely a soft tissue component of an osseous lesion versus less likely adenopathy. There are prominent subcarinal nodes measuring 11 mm, not seen previously. Limited assessment for hilar adenopathy given lack contrast. Lungs/Pleura: Moderate size right pleural effusion, increased from prior exam. There is associated compressive  atelectasis of the right lower and to a lesser extent upper lobes. There is a small left pleural effusion. The right apical mass has subjectively increased in size, is partially obscured by adjacent pleural fluid, and exact measurements are difficult. There are multiple new nodules in the right upper lobe, for example paramediastinal nodule measures 1.4 cm. There are multiple new nodules in the right middle and lower lobes, largest measuring 13 mm. There scattered pulmonary nodules throughout the left lung better also new. There is studding of the left interlobar fissure. There is septal thickening in both upper lobes that has progressed from prior exam. The trachea and central bronchi are patent, with probable radiation change in the right upper lobe bronchus. Upper Abdomen: Unchanged left lobe liver lesion measuring simple fluid density, likely cyst. Second lesion/cyst adjacent the gallbladder fossa is only partially included. A left perinephric nodule is increased in size from prior exam currently 3.1 cm, previously 1.4 cm. Gastrostomy tube in the stomach is partially included. Musculoskeletal: Distal right clavicular lucency with cortical irregularity, associated soft tissue mass. This is not definitively seen on prior. Mixed lytic and  sclerotic lesion within T9 vertebral body is grossly unchanged. There is sclerosis involving C7 vertebra, partially included. Sclerotic lesion in the sternum and superior T11 vertebral body is unchanged. Scattered sclerotic rib lesions again seen. IMPRESSION: 1. Progressive malignancy in the thorax. Multiple new bilateral pulmonary nodules in both lungs, more so on the right. Dominant right apical lesion has subjectively increased, partially obscured by surrounding pleural effusion. 2. Increased size of moderate right pleural effusion. Increased small left pleural effusion. 3. Soft tissue density adjacent the distal right clavicle with underlying bony irregularity. Suspect this is a soft tissue component of bone metastasis less likely adenopathy, and appears new from prior exam. Additional bony metastatic disease in the thorax is grossly stable. 4. Increased size of left pararenal nodule suspicious for metastatic disease in the upper abdomen. 5. Upper lobe septal thickening which may reflect pulmonary edema, lymphangitic spread of disease is also considered. This is increased from prior. 6. Small pericardial effusion, new. Electronically Signed   By: Jeb Levering M.D.   On: 07/30/2016 01:19   Dg Chest Port 1 View  Result Date: 07/29/2016 CLINICAL DATA:  Tachycardia and tachypnea. Shortness of breath. Metastatic lung cancer. EXAM: PORTABLE CHEST 1 VIEW COMPARISON:  Radiograph 06/09/2016, chest CT 05/11/2016 FINDINGS: Progressive opacification of the right hemithorax, no with diffuse hazy opacity throughout. More confluent opacity in the periphery of the right upper lobe. Normal heart size. Minimal blunting of left costophrenic angle, may be small left effusion. Left lung is otherwise clear. Bony metastatic disease not as well visualized currently. IMPRESSION: Progressive hazy opacification of right hemithorax, likely layering pleural effusion. Right apical masslike opacity again seen. Probable small left  pleural effusion. Electronically Signed   By: Jeb Levering M.D.   On: 07/29/2016 22:38    EKG: Independently reviewed.  Assessment/Plan Principal Problem:   Sepsis ([place]) Active Problems:   Malignant neoplasm of upper lobe of right lung (HCC)   Anemia in neoplastic disease   Pleural effusion on right   DNR (do not resuscitate) discussion   Thrombocytopenia ([place])    1. Sepsis - likely due to R pleural empyema +- Post obstructive PNA. 1. Cefepime and vanc 2. PNA pathway 3. Cultures pending 4. IR to tap pleural effusion in AM 5. IVF bolus done in ED, NS at 125 cc/hr 6. BP currently holding in 16X systolic 7. Tele monitor  8. Extensive DNR discussion, patient wishes for full care including intubation if needed at the moment despite my recommendations otherwise.  We will of course honor her wishes. 2. NSCLC - worsening mets on CT, not surprising given the recent not doing very well with chemo 1. IP consult to onc in AM, but I doubt they will have much to add at this point based on their office notes. 2. Continuing to hold chemo PO med in face of acute sepsis 3. Anemia and thrombocytopenia - Stable   DVT prophylaxis: SCDs due to thrombocytopenia Code Status: Full - long code status discussion with patient and family, I discussed with them that I would not recommend intubation in this situation due to likely hood of poor end outcome with stage 4 NSCLC.  In spite of this patient has expressed wishes to remain full code for now in front of family members. Family Communication: Family at bedside Consults called: None Admission status: Admit to inpatient   Etta Quill DO [facility] Pager [phone] from 7PM-7AM  If 7AM-7PM, please contact the day physician for the patient [email or web address] Password [email or web address]  07/30/2016, 1:42 AM

## 2016-07-30 NOTE — Progress Notes (Addendum)
   Patient seen and examined Discussed with significant other by the bedside  Laura Walter is a 65 y.o. 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 CT scan shows progressive malignancy with multiple new bilateral pulmonary nodules. Increased size of moderate right pleural effusion. Lymphangitis spread.  Plan Sepsis Focused orders are initiated Currently on broad-spectrum antibiotics Blood culture 2 Oncology notified-discussed with Dr. Julien Nordmann, prognosis guarded Prn fluid boluses DO NOT RESUSCITATE/DO NOT INTUBATE Palliative care consultation ordered and pending   Right-sided pleural effusion Likely parapneumonic Thoracentesis when blood pressure allows, likely in am   Anemia/thrombocytopenia Attributed to patient's recent chemotherapy according to significant other Monitor for active bleeding

## 2016-07-31 ENCOUNTER — Inpatient Hospital Stay (HOSPITAL_COMMUNITY): Payer: BC Managed Care – PPO

## 2016-07-31 LAB — CBC
HCT: 23.4 % — ABNORMAL LOW (ref 36.0–46.0)
HEMATOCRIT: 27.3 % — AB (ref 36.0–46.0)
Hemoglobin: 7.2 g/dL — ABNORMAL LOW (ref 12.0–15.0)
Hemoglobin: 8.5 g/dL — ABNORMAL LOW (ref 12.0–15.0)
MCH: 28.9 pg (ref 26.0–34.0)
MCH: 28.4 pg (ref 26.0–34.0)
MCHC: 30.8 g/dL (ref 30.0–36.0)
MCHC: 31.1 g/dL (ref 30.0–36.0)
MCV: 94 fL (ref 78.0–100.0)
MCV: 91.3 fL (ref 78.0–100.0)
PLATELETS: 29 10*3/uL — AB (ref 150–400)
Platelets: 30 10*3/uL — ABNORMAL LOW (ref 150–400)
RBC: 2.49 MIL/uL — ABNORMAL LOW (ref 3.87–5.11)
RBC: 2.99 MIL/uL — ABNORMAL LOW (ref 3.87–5.11)
RDW: 19.5 % — AB (ref 11.5–15.5)
RDW: 19.3 % — AB (ref 11.5–15.5)
WBC: 6.2 10*3/uL (ref 4.0–10.5)
WBC: 6.2 10*3/uL (ref 4.0–10.5)

## 2016-07-31 LAB — GRAM STAIN

## 2016-07-31 LAB — BODY FLUID CELL COUNT WITH DIFFERENTIAL
Lymphs, Fluid: 42 %
MONOCYTE-MACROPHAGE-SEROUS FLUID: 24 % — AB (ref 50–90)
Neutrophil Count, Fluid: 34 % — ABNORMAL HIGH (ref 0–25)
WBC FLUID: 583 uL (ref 0–1000)

## 2016-07-31 LAB — PREPARE RBC (CROSSMATCH)

## 2016-07-31 LAB — LACTATE DEHYDROGENASE, PLEURAL OR PERITONEAL FLUID: LD FL: 140 U/L — AB (ref 3–23)

## 2016-07-31 LAB — PROTEIN, BODY FLUID: Total protein, fluid: <3 g/dL

## 2016-07-31 LAB — HIV ANTIBODY (ROUTINE TESTING W REFLEX): HIV Screen 4th Generation wRfx: NONREACTIVE

## 2016-07-31 MED ORDER — SODIUM CHLORIDE 0.9 % IV SOLN
Freq: Once | INTRAVENOUS | Status: AC
  Administered 2016-07-31: 08:00:00 via INTRAVENOUS

## 2016-07-31 MED ORDER — DEXTROSE 5 % IV SOLN
1.0000 g | Freq: Three times a day (TID) | INTRAVENOUS | Status: DC
  Administered 2016-07-31 – 2016-08-01 (×3): 1 g via INTRAVENOUS
  Filled 2016-07-31 (×5): qty 1

## 2016-07-31 MED ORDER — MORPHINE SULFATE (PF) 2 MG/ML IV SOLN
2.0000 mg | INTRAVENOUS | Status: DC | PRN
Start: 2016-07-31 — End: 2016-08-01
  Administered 2016-07-31: 1 mg via INTRAVENOUS
  Filled 2016-07-31: qty 1

## 2016-07-31 MED ORDER — VANCOMYCIN HCL IN DEXTROSE 750-5 MG/150ML-% IV SOLN
750.0000 mg | Freq: Two times a day (BID) | INTRAVENOUS | Status: DC
  Administered 2016-07-31 – 2016-08-01 (×2): 750 mg via INTRAVENOUS
  Filled 2016-07-31 (×4): qty 150

## 2016-07-31 MED ORDER — MORPHINE SULFATE (CONCENTRATE) 10 MG/0.5ML PO SOLN
5.0000 mg | ORAL | Status: DC | PRN
  Administered 2016-07-31 – 2016-08-01 (×3): 5 mg via SUBLINGUAL
  Filled 2016-07-31 (×3): qty 0.5

## 2016-07-31 MED ORDER — LORAZEPAM 0.5 MG PO TABS: 0.5000 mg | ORAL_TABLET | ORAL | Status: DC | PRN

## 2016-07-31 MED ORDER — ACETAMINOPHEN 325 MG PO TABS
650.0000 mg | ORAL_TABLET | Freq: Four times a day (QID) | ORAL | Status: DC | PRN
  Administered 2016-07-31: 650 mg via ORAL
  Filled 2016-07-31: qty 2

## 2016-07-31 NOTE — Progress Notes (Signed)
Pharmacy Antibiotic Note  Laura Walter is a 65 y.o. female admitted on 07/29/2016 with pneumonia and sepsis.  Pharmacy has been consulted for Vancomycin, cefepime  dosing.  Plan:  Cefepime 1g IV q8h.  Vancomycin 750 mg IV q12h.  Measure Vanc trough at steady state.  Follow up renal fxn, culture results, and clinical course.   Height: '5\' 6"'$  (167.6 cm) Weight: 139 lb 12.4 oz (63.4 kg) IBW/kg (Calculated) : 59.3  Temp (24hrs), Avg:98.4 F (36.9 C), Min:97.4 F (36.3 C), Max:99.3 F (37.4 C)   Recent Labs Lab 07/29/16 2236 07/29/16 2251 07/30/16 0340 07/30/16 0759 07/30/16 1056 07/31/16 0337  WBC 7.6  --  6.8  --   --  6.2  CREATININE 1.03*  --  0.75  --   --   --   LATICACIDVEN  --  2.09*  --  0.8 0.8  --     Estimated Creatinine Clearance: 67.4 mL/min (by C-G formula based on SCr of 0.75 mg/dL).    Allergies  Allergen Reactions  . Sulfonamide Derivatives Rash    Childhood reaction    Antimicrobials this admission: 12/25 Vancomycin  >> 12/25 Cefepime >>   Dose adjustments this admission: 12/27 Renal function improved since admission.  Antibiotic doses increased.  Microbiology results: 12/25 BCx: sent 12/26 MRSA PCR: negative 12/26 UCx: sent  Thank you for allowing pharmacy to be a part of this patient's care.  Gretta Arab PharmD, BCPS Pager [phone] 07/31/2016 10:36 AM

## 2016-07-31 NOTE — Progress Notes (Addendum)
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 CT scan shows progressive malignancy with multiple new bilateral pulmonary nodules. Increased size of moderate right pleural effusion. Lymphangitic spread.    Assessment and plan Sepsis secondary to postobstructive pneumonia/right pleural effusion Focused orders are initiated Currently on broad-spectrum antibiotics, day #2 Blood culture 2, no growth so far Oncology notified-discussed with Dr. Julien Nordmann, prognosis guarded Prn fluid boluses DO NOT RESUSCITATE/DO NOT INTUBATE Palliative care consultation completed Patient is hospice eligible  metastatic non-small cell lung cancer diagnosed in June 2016 /Right-sided pleural effusion Likely parapneumonic vs malignant Thoracentesis today Prognosis poor Hospice eligible  Anemia/thrombocytopenia Attributed to patient's recent chemotherapy according to significant other Monitor for active bleeding Will transfuse 1 unit of packed red blood cells Avoid heparin/Lovenox  Acute hypoxic respiratory failure Questionable pulmonary edema versus lymphangitic spread of disease vs pleural effusion    DVT prophylaxsis  SCDs  Code Status:  DO NOT RESUSCITATE    Family Communication:  Discussed in detail with the patient, all imaging results, lab results explained to the patient   Disposition Plan:  Likely discharge home with hospice , transfer to oncology floor     Consultants:  Oncology  Procedures:  None  Antibiotics: Anti-infectives    Start     Dose/Rate Route Frequency Ordered Stop   07/31/16 1800  vancomycin (VANCOCIN) IVPB 750 mg/150 ml premix     750 mg 150 mL/hr over 60 Minutes Intravenous Every 12 hours 07/31/16 1043     07/31/16 1100  ceFEPIme (MAXIPIME) 1 g in dextrose 5 % 50 mL IVPB     1 g 100 mL/hr over 30 Minutes Intravenous Every 8 hours 07/31/16 1043     07/30/16 1800  vancomycin (VANCOCIN) 500 mg in sodium chloride 0.9 % 100 mL IVPB  Status:  Discontinued     500 mg 100 mL/hr over 60 Minutes Intravenous Every 12 hours 07/30/16 0333 07/31/16 1043   07/30/16 1000  ceFEPIme (MAXIPIME) 2 g in dextrose 5 % 50 mL IVPB  Status:  Discontinued     2 g 100 mL/hr over 30 Minutes Intravenous Every 12 hours 07/30/16 0333 07/31/16 1043   07/29/16 2300  ceFEPIme (MAXIPIME) 2 g in dextrose 5 % 50 mL IVPB     2 g 100 mL/hr over 30 Minutes Intravenous  Once 07/29/16 2248 07/30/16 0027   07/29/16 2300  vancomycin (VANCOCIN) IVPB 1000 mg/200 mL premix     1,000 mg 200 mL/hr over 60 Minutes Intravenous  Once 07/29/16 2248 07/30/16 0042   07/29/16 2245  metroNIDAZOLE (FLAGYL) tablet 500 mg  Status:  Discontinued     500 mg Oral  Once 07/29/16 2241 07/29/16 2246   07/29/16 0000  metroNIDAZOLE (FLAGYL) 500 MG tablet  Status:  Discontinued  500 mg Oral 3 times daily 07/29/16 2243 07/29/16          HPI/Subjective: Continues to be tachycardic, hypotensive, complaining of shortness of breath  Objective: Vitals:   07/31/16 1000 07/31/16 1023 07/31/16 1043 07/31/16 1100  BP: (!) 92/53 100/62 (!) 92/58 96/60  Pulse: (!) 111 (!) 109 (!) 110 (!) 115  Resp: (!) 48 (!) 42 (!) 51 (!) 47  Temp:  97.4 F (36.3 C) 97.5 F (36.4 C)   TempSrc:  Oral Oral    SpO2: 95% 96% 94% 96%  Weight:      Height:        Intake/Output Summary (Last 24 hours) at 07/31/16 1135 Last data filed at 07/31/16 1043  Gross per 24 hour  Intake           925.42 ml  Output              700 ml  Net           225.42 ml    Exam:  Examination:  General exam: Appears calm and comfortable  Respiratory system: Clear to auscultation. Respiratory effort normal. Cardiovascular system: S1 & S2 heard, RRR. No JVD, murmurs, rubs, gallops or clicks. No pedal edema. Gastrointestinal system: Abdomen is nondistended, soft and nontender. No organomegaly or masses felt. Normal bowel sounds heard. Central nervous system: Alert and oriented. No focal neurological deficits. Extremities: Symmetric 5 x 5 power. Skin: No rashes, lesions or ulcers Psychiatry: Judgement and insight appear normal. Mood & affect appropriate.     Data Reviewed: I have personally reviewed following labs and imaging studies  Micro Results Recent Results (from the past 240 hour(s))  MRSA PCR Screening     Status: None   Collection Time: 07/30/16  3:35 AM  Result Value Ref Range Status   MRSA by PCR NEGATIVE NEGATIVE Final    Comment:        The GeneXpert MRSA Assay (FDA approved for NASAL specimens only), is one component of a comprehensive MRSA colonization surveillance program. It is not intended to diagnose MRSA infection nor to guide or monitor treatment for MRSA infections.     Radiology Reports Ct Chest Wo Contrast  Result Date: 07/30/2016 CLINICAL DATA:  Sepsis. Fever, fatigue. Cough for 1 week. History of stage IV lung cancer, ongoing chemotherapy. EXAM: CT CHEST WITHOUT CONTRAST TECHNIQUE: Multidetector CT imaging of the chest was performed following the standard protocol without IV contrast. COMPARISON:  Radiographs yesterday.  Most recent chest CT 05/11/2016 FINDINGS: Cardiovascular: Atherosclerosis of the thoracic aorta. Decreased density of blood pool suggesting anemia. There  are coronary artery calcifications. Small to moderate pericardial effusion is new from prior, measure simple fluid density. Largest pericardial fluid volume adjacent to the right heart measures up to 1.5 cm. Mediastinum/Nodes: Soft tissue mass about the distal right clavicle is likely a soft tissue component of an osseous lesion versus less likely adenopathy. There are prominent subcarinal nodes measuring 11 mm, not seen previously. Limited assessment for hilar adenopathy given lack contrast. Lungs/Pleura: Moderate size right pleural effusion, increased from prior exam. There is associated compressive atelectasis of the right lower and to a lesser extent upper lobes. There is a small left pleural effusion. The right apical mass has subjectively increased in size, is partially obscured by adjacent pleural fluid, and exact measurements are difficult. There are multiple new nodules in the right upper lobe, for example paramediastinal nodule measures 1.4 cm. There are multiple new nodules in the right middle and  lower lobes, largest measuring 13 mm. There scattered pulmonary nodules throughout the left lung better also new. There is studding of the left interlobar fissure. There is septal thickening in both upper lobes that has progressed from prior exam. The trachea and central bronchi are patent, with probable radiation change in the right upper lobe bronchus. Upper Abdomen: Unchanged left lobe liver lesion measuring simple fluid density, likely cyst. Second lesion/cyst adjacent the gallbladder fossa is only partially included. A left perinephric nodule is increased in size from prior exam currently 3.1 cm, previously 1.4 cm. Gastrostomy tube in the stomach is partially included. Musculoskeletal: Distal right clavicular lucency with cortical irregularity, associated soft tissue mass. This is not definitively seen on prior. Mixed lytic and sclerotic lesion within T9 vertebral body is grossly unchanged. There is  sclerosis involving C7 vertebra, partially included. Sclerotic lesion in the sternum and superior T11 vertebral body is unchanged. Scattered sclerotic rib lesions again seen. IMPRESSION: 1. Progressive malignancy in the thorax. Multiple new bilateral pulmonary nodules in both lungs, more so on the right. Dominant right apical lesion has subjectively increased, partially obscured by surrounding pleural effusion. 2. Increased size of moderate right pleural effusion. Increased small left pleural effusion. 3. Soft tissue density adjacent the distal right clavicle with underlying bony irregularity. Suspect this is a soft tissue component of bone metastasis less likely adenopathy, and appears new from prior exam. Additional bony metastatic disease in the thorax is grossly stable. 4. Increased size of left pararenal nodule suspicious for metastatic disease in the upper abdomen. 5. Upper lobe septal thickening which may reflect pulmonary edema, lymphangitic spread of disease is also considered. This is increased from prior. 6. Small pericardial effusion, new. Electronically Signed   By: Jeb Levering M.D.   On: 07/30/2016 01:19   Dg Chest Port 1 View  Result Date: 07/29/2016 CLINICAL DATA:  Tachycardia and tachypnea. Shortness of breath. Metastatic lung cancer. EXAM: PORTABLE CHEST 1 VIEW COMPARISON:  Radiograph 06/09/2016, chest CT 05/11/2016 FINDINGS: Progressive opacification of the right hemithorax, no with diffuse hazy opacity throughout. More confluent opacity in the periphery of the right upper lobe. Normal heart size. Minimal blunting of left costophrenic angle, may be small left effusion. Left lung is otherwise clear. Bony metastatic disease not as well visualized currently. IMPRESSION: Progressive hazy opacification of right hemithorax, likely layering pleural effusion. Right apical masslike opacity again seen. Probable small left pleural effusion. Electronically Signed   By: Jeb Levering M.D.   On:  07/29/2016 22:38     CBC  Recent Labs Lab 07/29/16 2236 07/30/16 0340 07/31/16 0337  WBC 7.6 6.8 6.2  HGB 8.2* 7.2* 7.2*  HCT 25.9* 22.8* 23.4*  PLT 41* 31* 29*  MCV 91.5 92.3 94.0  MCH 29.0 29.1 28.9  MCHC 31.7 31.6 30.8  RDW 18.9* 18.9* 19.5*  LYMPHSABS 0.4*  --   --   MONOABS 0.7  --   --   EOSABS 0.1  --   --   BASOSABS 0.0  --   --     Chemistries   Recent Labs Lab 07/29/16 2236 07/30/16 0340  NA 140 141  K 4.1 3.8  CL 108 114*  CO2 24 19*  GLUCOSE 126* 103*  BUN 47* 36*  CREATININE 1.03* 0.75  CALCIUM 8.0* 7.1*  AST 67*  --   ALT 68*  --   ALKPHOS 62  --   BILITOT 0.3  --    ------------------------------------------------------------------------------------------------------------------ estimated creatinine clearance is 67.4 mL/min (by C-G  formula based on SCr of 0.75 mg/dL). ------------------------------------------------------------------------------------------------------------------ No results for input(s): HGBA1C in the last 72 hours. ------------------------------------------------------------------------------------------------------------------ No results for input(s): CHOL, HDL, LDLCALC, TRIG, CHOLHDL, LDLDIRECT in the last 72 hours. ------------------------------------------------------------------------------------------------------------------ No results for input(s): TSH, T4TOTAL, T3FREE, THYROIDAB in the last 72 hours.  Invalid input(s): FREET3 ------------------------------------------------------------------------------------------------------------------ No results for input(s): VITAMINB12, FOLATE, FERRITIN, TIBC, IRON, RETICCTPCT in the last 72 hours.  Coagulation profile No results for input(s): INR, PROTIME in the last 168 hours.  No results for input(s): DDIMER in the last 72 hours.  Cardiac Enzymes No results for input(s): CKMB, TROPONINI, MYOGLOBIN in the last 168 hours.  Invalid input(s):  CK ------------------------------------------------------------------------------------------------------------------ Invalid input(s): POCBNP   CBG: No results for input(s): GLUCAP in the last 168 hours.     Studies: Ct Chest Wo Contrast  Result Date: 07/30/2016 CLINICAL DATA:  Sepsis. Fever, fatigue. Cough for 1 week. History of stage IV lung cancer, ongoing chemotherapy. EXAM: CT CHEST WITHOUT CONTRAST TECHNIQUE: Multidetector CT imaging of the chest was performed following the standard protocol without IV contrast. COMPARISON:  Radiographs yesterday.  Most recent chest CT 05/11/2016 FINDINGS: Cardiovascular: Atherosclerosis of the thoracic aorta. Decreased density of blood pool suggesting anemia. There are coronary artery calcifications. Small to moderate pericardial effusion is new from prior, measure simple fluid density. Largest pericardial fluid volume adjacent to the right heart measures up to 1.5 cm. Mediastinum/Nodes: Soft tissue mass about the distal right clavicle is likely a soft tissue component of an osseous lesion versus less likely adenopathy. There are prominent subcarinal nodes measuring 11 mm, not seen previously. Limited assessment for hilar adenopathy given lack contrast. Lungs/Pleura: Moderate size right pleural effusion, increased from prior exam. There is associated compressive atelectasis of the right lower and to a lesser extent upper lobes. There is a small left pleural effusion. The right apical mass has subjectively increased in size, is partially obscured by adjacent pleural fluid, and exact measurements are difficult. There are multiple new nodules in the right upper lobe, for example paramediastinal nodule measures 1.4 cm. There are multiple new nodules in the right middle and lower lobes, largest measuring 13 mm. There scattered pulmonary nodules throughout the left lung better also new. There is studding of the left interlobar fissure. There is septal thickening in  both upper lobes that has progressed from prior exam. The trachea and central bronchi are patent, with probable radiation change in the right upper lobe bronchus. Upper Abdomen: Unchanged left lobe liver lesion measuring simple fluid density, likely cyst. Second lesion/cyst adjacent the gallbladder fossa is only partially included. A left perinephric nodule is increased in size from prior exam currently 3.1 cm, previously 1.4 cm. Gastrostomy tube in the stomach is partially included. Musculoskeletal: Distal right clavicular lucency with cortical irregularity, associated soft tissue mass. This is not definitively seen on prior. Mixed lytic and sclerotic lesion within T9 vertebral body is grossly unchanged. There is sclerosis involving C7 vertebra, partially included. Sclerotic lesion in the sternum and superior T11 vertebral body is unchanged. Scattered sclerotic rib lesions again seen. IMPRESSION: 1. Progressive malignancy in the thorax. Multiple new bilateral pulmonary nodules in both lungs, more so on the right. Dominant right apical lesion has subjectively increased, partially obscured by surrounding pleural effusion. 2. Increased size of moderate right pleural effusion. Increased small left pleural effusion. 3. Soft tissue density adjacent the distal right clavicle with underlying bony irregularity. Suspect this is a soft tissue component of bone metastasis less likely adenopathy, and appears new from prior  exam. Additional bony metastatic disease in the thorax is grossly stable. 4. Increased size of left pararenal nodule suspicious for metastatic disease in the upper abdomen. 5. Upper lobe septal thickening which may reflect pulmonary edema, lymphangitic spread of disease is also considered. This is increased from prior. 6. Small pericardial effusion, new. Electronically Signed   By: Jeb Levering M.D.   On: 07/30/2016 01:19   Dg Chest Port 1 View  Result Date: 07/29/2016 CLINICAL DATA:  Tachycardia and  tachypnea. Shortness of breath. Metastatic lung cancer. EXAM: PORTABLE CHEST 1 VIEW COMPARISON:  Radiograph 06/09/2016, chest CT 05/11/2016 FINDINGS: Progressive opacification of the right hemithorax, no with diffuse hazy opacity throughout. More confluent opacity in the periphery of the right upper lobe. Normal heart size. Minimal blunting of left costophrenic angle, may be small left effusion. Left lung is otherwise clear. Bony metastatic disease not as well visualized currently. IMPRESSION: Progressive hazy opacification of right hemithorax, likely layering pleural effusion. Right apical masslike opacity again seen. Probable small left pleural effusion. Electronically Signed   By: Jeb Levering M.D.   On: 07/29/2016 22:38      No results found for: HGBA1C Lab Results  Component Value Date   LDLCALC 64 10/13/2014   CREATININE 0.75 07/30/2016       Scheduled Meds: . ceFEPime (MAXIPIME) IV  1 g Intravenous Q8H  . chlorhexidine  15 mL Mouth Rinse BID  . feeding supplement (OSMOLITE 1.5 CAL)  237 mL Per Tube TID  . feeding supplement (PRO-STAT SUGAR FREE 64)  30 mL Oral BID  . folic acid  1 mg Oral Daily  . levETIRAcetam  500 mg Oral Daily  . levETIRAcetam  750 mg Oral QHS  . mouth rinse  15 mL Mouth Rinse q12n4p  . multivitamin with minerals  1 tablet Oral q morning - 10a  . OLANZapine  10 mg Oral QHS  . pantoprazole  40 mg Oral Daily  . senna-docusate  1 tablet Oral BID  . vancomycin  750 mg Intravenous Q12H   Continuous Infusions: . sodium chloride 125 mL/hr at 07/30/16 0811     LOS: 1 day    Time spent: >30 MINS    [facility]  [facility] Pager [phone]. If 7PM-7AM, please contact night-coverage at [email or web address], password [facility] 07/31/2016, 11:35 AM  LOS: 1 day

## 2016-07-31 NOTE — Progress Notes (Signed)
   Patient seen and examined Discussed with significant other by the bedside  Laura Walter is a 64 y.o. 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 CT scan shows progressive malignancy with multiple new bilateral pulmonary nodules. Increased size of moderate right pleural effusion. Lymphangitis spread.  Plan Sepsis Focused orders are initiated Currently on broad-spectrum antibiotics Blood culture 2 Oncology notified-discussed with Dr. Julien Nordmann, prognosis guarded Prn fluid boluses DO NOT RESUSCITATE/DO NOT INTUBATE Palliative care consultation ordered and pending   Right-sided pleural effusion Likely parapneumonic Thoracentesis when blood pressure allows, likely in am   Anemia/thrombocytopenia Attributed to patient's recent chemotherapy according to significant other Monitor for active bleeding

## 2016-07-31 NOTE — Progress Notes (Signed)
NP paged at beginning of shift by RN asking about 2nd unit of blood since pt has fever. NP spoke with RN at that time and 2nd unit being held awaiting recheck of CBC and tylenol for fever.  CBC resulted later showing a Hgb up to 8.5. Concern over plt count earlier today of 29,000. Offending meds d/c'd.  Spoke with RN. Pt is stable with no sx of active bleeding. Will hold 2nd unit of blood for now and recheck CBC at [place], NP [facility]

## 2016-07-31 NOTE — Progress Notes (Signed)
LCSWA met with family at bedside. Patient sleeping and waiting for partner to return to discuss Home vs. Residential Hospice.  LCSWA spoke with patient significant other she reports patient wants to make her own decision in regards to disposition. She reports states patient will have a decision made by today. LCSWA informed patient significant other [facility] does not have any beds at this time. The patient may have to go to residential facility outside of [place], such as [facility]. CSW will continue to help define disposition.

## 2016-07-31 NOTE — Progress Notes (Signed)
Daily Progress Note   Patient Name: Laura Walter       Date: 07/31/2016 DOB: 1951/03/23  Age: 65 y.o. MRN#: [id number] Attending Physician: Reyne Dumas, MD Primary Care Physician: [name], MD Admit Date: 07/29/2016  Reason for Consultation/Follow-up: Establishing goals of care  Subjective:  Laura Walter and Avina saw Dr. Julien Nordmann last night and he encouraged them to consider comfort care. They are desiring comfort care but are needing some extra time to process this. Laura Walter tells me her goals are to be surrounded by her family and friends and be comfortable. I discussed comfort care with them and options for hospice at home vs residential hospice. Laura Walter does not want to be a burden on her friends and family but she also wants to be at home. We started discussing interventions for comfort care. Discussed medications used for SOB, anxiety, agitation. 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 She doesn't like the bipap mask and wants to try and be off of it as much as possible. We discussed that tube feedings could be continued if she feels hungry and requests them for comfort, or she could eat or drink what she desired, but if she doesn't feel hungry, or the tube feedings make her uncomfortable then there is no reason to continue them as they would then not be providing comfort. We discussed that IV fluids, IV antibiotics, and other medications would be stopped that did not have goal of comfort. They wish to take some time to discuss this new information, and they are open to discussing Hospice with a representative. Laura Walter has no complaints at this time. She is overwhelmed with facing the end of her life, but she is processing and her and Laura Walter  both have goals of allowing her to die a peaceful death.   Review of Systems  Respiratory: Positive for cough and shortness of breath.   Gastrointestinal: Positive for diarrhea. Negative for abdominal pain, constipation and nausea.  Neurological: Positive for tremors.  All other systems reviewed and are negative.   Length of Stay: 1  Current Medications: Scheduled Meds:  . sodium chloride   Intravenous Once  . ceFEPime (MAXIPIME) IV  2 g Intravenous Q12H  . chlorhexidine  15 mL Mouth Rinse BID  . feeding supplement (OSMOLITE  1.5 CAL)  237 mL Per Tube TID  . feeding supplement (PRO-STAT SUGAR FREE 64)  30 mL Oral BID  . folic acid  1 mg Oral Daily  . levETIRAcetam  500 mg Oral Daily  . levETIRAcetam  750 mg Oral QHS  . mouth rinse  15 mL Mouth Rinse q12n4p  . multivitamin with minerals  1 tablet Oral q morning - 10a  . OLANZapine  10 mg Oral QHS  . pantoprazole  40 mg Oral Daily  . senna-docusate  1 tablet Oral BID  . vancomycin  500 mg Intravenous Q12H    Continuous Infusions: . sodium chloride 125 mL/hr at 07/30/16 0811    PRN Meds: HYDROcodone-acetaminophen, polyethylene glycol, sodium chloride, traMADol  Physical Exam  Constitutional: She is oriented to person, place, and time.  Eyes: EOM are normal.  Pulmonary/Chest: She has rales.  Increased rate  Abdominal: Soft. Bowel sounds are normal.  Genitourinary:  Genitourinary Comments: Foley cath in place  Musculoskeletal:  Generalized weakness  Neurological: She is alert and oriented to person, place, and time.  Skin: There is pallor.  Nursing note and vitals reviewed.           Vital Signs: BP 93/61 (BP Location: Left Arm)   Pulse (!) 104   Temp 99.3 F (37.4 C) (Axillary)   Resp (!) 35   Ht 5' 6"  (1.676 m)   Wt 63.4 kg (139 lb 12.4 oz)   SpO2 99%   BMI 22.56 kg/m  SpO2: SpO2: 99 % O2 Device: O2 Device: Bi-PAP O2 Flow Rate: O2 Flow Rate (L/min): 3 L/min  Intake/output summary:  Intake/Output Summary  (Last 24 hours) at 07/31/16 0941 Last data filed at 07/31/16 0786  Gross per 24 hour  Intake          1260.42 ml  Output              700 ml  Net           560.42 ml   LBM: Last BM Date: 07/30/16 Baseline Weight: Weight: 59 kg (130 lb) Most recent weight: Weight: 63.4 kg (139 lb 12.4 oz)       Palliative Assessment/Data: PPS: 10%    Flowsheet Rows   Flowsheet Row Most Recent Value  Intake Tab  Referral Department  Hospitalist  Unit at Time of Referral  ICU  Palliative Care Primary Diagnosis  Cancer  Date Notified  07/30/16  Palliative Care Type  Return patient Palliative Care  Reason for referral  End of Life Care Assistance, Clarify Goals of Care  Date of Admission  07/29/16  Date first seen by Palliative Care  07/30/16  # of days Palliative referral response time  0 Day(s)  # of days IP prior to Palliative referral  1  Clinical Assessment  Palliative Performance Scale Score  10%  Psychosocial & Spiritual Assessment  Social Work Plan of Care  Participated in Family meeting  Palliative Care Outcomes  Patient/Family meeting held?  Yes  Who was at the meeting?  Patient's wife- Laura Walter Outcomes  Clarified goals of care  Palliative Care follow-up planned  Yes, Facility      Patient Active Problem List   Diagnosis Date Noted  . DNR (do not resuscitate) discussion 07/30/2016  . Thrombocytopenia ([place]) 07/30/2016  . Pressure injury of skin 07/30/2016  . Malignant neoplasm of lung ([place])   . Goals of care, counseling/discussion   . Palliative care by specialist   . Pleural  effusion on right 07/29/2016  . Sepsis ([place]) 07/29/2016  . Symptomatic anemia 06/10/2016  . Sacral ulcer ([place]) 06/10/2016  . Protein-calorie malnutrition, severe 05/15/2016  . Emesis 05/14/2016  . Metastatic cancer ([place])   . Weakness 05/11/2016  . Aphasia 03/12/2016  . Pain management 03/09/2016  . Dehydration 03/09/2016  . 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 Brain  metastases ([place]) 02/14/2016  . Paronychia 11/17/2015  . Paronychia of great toe, right 08/21/2015  . Paronychia of great toe, left 08/21/2015  . Nausea without vomiting 04/24/2015  . Feeding difficulties 04/11/2015  . Encounter for antineoplastic chemotherapy 03/06/2015  . Hypokalemia 02/15/2015  . Protein-calorie malnutrition ([place]) 02/15/2015  . Neutropenic fever ([place]) 02/11/2015  . Neutropenia with fever ([place]) 02/11/2015  . Bicytopenia 02/11/2015  . Diarrhea 02/11/2015  . Malignant neoplasm of upper lobe of right lung ([place]) 01/05/2015  . 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 Physical exam 10/13/2014  . Osteopenia 10/07/2013  . Benign positional vertigo 05/08/2012  . TINNITUS, LEFT 01/09/2009  . CARDIAC MURMUR, AORTIC 01/05/2007  . MOTION SICKNESS 01/05/2007    Palliative Care Assessment & Plan   Patient Profile: 65 y.o. 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 Workup reveals pneumonia and worsening lung cancer.  Assessment/Recommendations/Plan   Continue current level of care- patient and spouse are in state of shock, while they are willing to transition to comfort care, they are not quite ready  Try to decrease use of Bipap  Social work consult for Hospice consult to discuss home vs. Residential hospice  Continue telemetry per patient's preference  2 units blood today to increase patient's energy before transitioning to comfort care  Thoracentesis for comfort before transition to comfort care  Morphine intensol 25m sublingual q1hr prn SOB, anxiety, pain  Lorazepam .554msublingual prn anxiety, SOB  Goals of Care and Additional Recommendations:  Limitations on Scope of Treatment: Avoid  Hospitalization  Code Status:  DNR  Prognosis:   < 2 weeks d/t pneumonia in setting of advanced lung cancer with lymphaginic spread and plan for transition to comfort care  Discharge Planning:  To Be Determined  Care plan was discussed with patient, spouse- BoTeodoro Sprayalso HCPOA) and patient's RN- CaChrys Racer Thank you for allowing the Palliative Medicine Team to assist in the care of this patient.   Time In: 0830 Time Out: 1000 Total Time 90 mins Prolonged Time Billed Yes      Greater than 50%  of this time was spent counseling and coordinating care related to the above assessment and plan.  KaMariana KaufmanAGNP-C Palliative Medicine   Please contact Palliative Medicine Team phone at [phone] questions and concerns.

## 2016-07-31 NOTE — Progress Notes (Signed)
RT note:  Bipap order noted in system.  Patient found to be lethargic post procedure.  Family states she is typically fully verbal at baseline, but has been lethargic since coming back to the room.  Patient opens eyes and follows basic commands, but does is not verbalizing.  RT will not place on Bipap at this time due to aspiration risk.  Resp status stable at this time with no increase in WOB or RR.  Communicated with family who verbalized understanding.  RT discussed patient status as well as decision regarding bipap with RN.

## 2016-07-31 NOTE — Procedures (Signed)
Ultrasound-guided diagnostic and therapeutic right thoracentesis performed yielding 1 liters of yellow colored fluid. No immediate complications. Follow-up chest x-ray pending.  Mell Mellott E 12:35 PM 07/31/2016

## 2016-08-01 DIAGNOSIS — Z515 Encounter for palliative care: Secondary | ICD-10-CM

## 2016-08-01 LAB — COMPREHENSIVE METABOLIC PANEL
ALBUMIN: 1.7 g/dL — AB (ref 3.5–5.0)
ALT: 39 U/L (ref 14–54)
AST: 38 U/L (ref 15–41)
Alkaline Phosphatase: 51 U/L (ref 38–126)
Anion gap: 7 (ref 5–15)
BUN: 31 mg/dL — AB (ref 6–20)
CO2: 20 mmol/L — ABNORMAL LOW (ref 22–32)
CREATININE: 0.58 mg/dL (ref 0.44–1.00)
Calcium: 6.9 mg/dL — ABNORMAL LOW (ref 8.9–10.3)
Chloride: 116 mmol/L — ABNORMAL HIGH (ref 101–111)
GFR calc Af Amer: >60 mL/min (ref >60)
GFR calc non Af Amer: >60 mL/min (ref >60)
GLUCOSE: 130 mg/dL — AB (ref 65–99)
POTASSIUM: 3.8 mmol/L (ref 3.5–5.1)
Sodium: 143 mmol/L (ref 135–145)
TOTAL PROTEIN: 4.9 g/dL — AB (ref 6.5–8.1)
Total Bilirubin: 0.5 mg/dL (ref 0.3–1.2)

## 2016-08-01 LAB — URINE CULTURE
Culture: NO GROWTH
Culture: NO GROWTH

## 2016-08-01 LAB — CBC
HEMATOCRIT: 25.9 % — AB (ref 36.0–46.0)
HEMOGLOBIN: 8.1 g/dL — AB (ref 12.0–15.0)
MCH: 28.6 pg (ref 26.0–34.0)
MCHC: 31.3 g/dL (ref 30.0–36.0)
MCV: 91.5 fL (ref 78.0–100.0)
Platelets: 27 10*3/uL — CL (ref 150–400)
RBC: 2.83 MIL/uL — AB (ref 3.87–5.11)
RDW: 19.6 % — AB (ref 11.5–15.5)
WBC: 5.6 10*3/uL (ref 4.0–10.5)

## 2016-08-01 LAB — PH, BODY FLUID: pH, Body Fluid: 7.7

## 2016-08-01 LAB — PATHOLOGIST SMEAR REVIEW

## 2016-08-01 MED ORDER — HYDROCODONE-ACETAMINOPHEN 5-325 MG PO TABS: 1.0000 | ORAL_TABLET | ORAL | 0 refills | Status: AC | PRN

## 2016-08-01 MED ORDER — AMOXICILLIN-POT CLAVULANATE 600-42.9 MG/5ML PO SUSR: 600.0000 mg | Freq: Two times a day (BID) | ORAL | 0 refills | Status: AC

## 2016-08-01 MED ORDER — MORPHINE SULFATE (CONCENTRATE) 10 MG/0.5ML PO SOLN: 5.0000 mg | ORAL | 0 refills | Status: AC | PRN

## 2016-08-01 MED ORDER — LORAZEPAM 0.5 MG PO TABS: 0.5000 mg | ORAL_TABLET | ORAL | 0 refills | Status: AC | PRN

## 2016-08-01 NOTE — Progress Notes (Signed)
Nutrition Brief Note  Chart reviewed. Pt now transitioning to comfort care.  No further nutrition interventions warranted at this time.  Please consult as needed.   Clayton Bibles, MS, RD, LDN Pager: [phone] After Hours Pager: [phone]

## 2016-08-01 NOTE — Discharge Summary (Signed)
Physician Discharge Summary  Laura Walter MRN: [id number] DOB/AGE: February 07, 1951 65 y.o.  PCP: [name], MD   Admit date: 07/29/2016 Discharge date: 08/01/2016  Discharge Diagnoses:   Principal Problem:   Sepsis [facility]) Active Problems:   Malignant neoplasm of upper lobe of right lung (HCC)   Anemia in neoplastic disease   Pleural effusion on right   DNR (do not resuscitate) discussion   Thrombocytopenia ([place])   Pressure injury of skin   Malignant neoplasm of lung ([place])   Goals of care, counseling/discussion   Palliative care by specialist    Follow-up recommendations Follow-up with PCP in 3-5 days , including all  additional recommended appointments as below Follow-up CBC, CMP in 3-5 days Consider repeat thoracentesis for recurrent pleural effusion, as outpatient procedure to provide relief and comfort      Current Discharge Medication List    START taking these medications   Details  amoxicillin-clavulanate (AUGMENTIN ES-600) 600-42.9 MG/5ML suspension Place 5 mLs (600 mg total) into feeding tube 2 (two) times daily. Qty: 200 mL, Refills: 0    LORazepam (ATIVAN) 0.5 MG tablet Place 1 tablet (0.5 mg total) under the tongue every 4 (four) hours as needed for anxiety. Qty: 30 tablet, Refills: 0    Morphine Sulfate (MORPHINE CONCENTRATE) 10 MG/0.5ML SOLN concentrated solution Place 0.25 mLs (5 mg total) under the tongue every hour as needed for moderate pain, severe pain, anxiety or shortness of breath. Qty: 30 mL, Refills: 0      CONTINUE these medications which have CHANGED   Details  HYDROcodone-acetaminophen (NORCO) 5-325 MG tablet Take 1 tablet by mouth every 4 (four) hours as needed for moderate pain. Qty: 60 tablet, Refills: 0      CONTINUE these medications which have NOT CHANGED   Details  Amino Acids-Protein Hydrolys (FEEDING SUPPLEMENT, PRO-STAT SUGAR FREE 64,) LIQD Take 30 mLs by mouth 2 (two) times daily. Qty: 900 mL, Refills: 0     FeFum-FePoly-FA-B Cmp-C-Biot (INTEGRA PLUS) CAPS Take 1 capsule by mouth every morning. Qty: 90 capsule, Refills: 0   Associated Diagnoses: Malignant neoplasm of upper lobe of right lung ([place]); Bone metastases ([place]); Encounter for antineoplastic chemotherapy    folic acid (FOLVITE) 1 MG tablet Take 1 mg by mouth daily.    levETIRAcetam (KEPPRA) 500 MG tablet 1 tablet in the morning and 1.5 tablets in the evening Qty: 75 tablet, Refills: 11    Nutritional Supplements (FEEDING SUPPLEMENT, OSMOLITE 1.5 CAL,) LIQD Place 237 mLs into feeding tube 4 (four) times daily. Qty: 958 mL, Refills: 12    OLANZapine (ZYPREXA) 10 MG tablet TAKE 1 TABLET (10 MG TOTAL) BY MOUTH AT BEDTIME. (INS PAYS 12/13/2015) Qty: 30 tablet, Refills: 0    omeprazole (PRILOSEC) 20 MG capsule Take 1 capsule (20 mg total) by mouth daily. Qty: 30 capsule, Refills: 1    polyethylene glycol (MIRALAX / GLYCOLAX) packet Take 17 g by mouth daily as needed. Qty: 14 each, Refills: 0    senna-docusate (SENOKOT-S) 8.6-50 MG tablet Take 1 tablet by mouth 2 (two) times daily. Qty: 30 tablet, Refills: 0   Associated Diagnoses: Malignant neoplasm of upper lobe of right lung ([place]); Brain metastases ([place]); Metastasis to bone [facility]); Moderate protein-calorie malnutrition ([place]); Encounter for antineoplastic chemotherapy      STOP taking these medications     osimertinib mesylate (TAGRISSO) 80 MG tablet      traMADol (ULTRAM) 50 MG tablet          Discharge Condition:  Prognosis  poor    Discharge Instructions Get Medicines reviewed and adjusted: Please take all your medications with you for your next visit with your Primary MD  Please request your Primary MD to go over all hospital tests and procedure/radiological results at the follow up, please ask your Primary MD to get all Hospital records sent to his/her office.  If you experience worsening of your admission symptoms, develop shortness of breath, life threatening  emergency, suicidal or homicidal thoughts you must seek medical attention immediately by calling 911 or calling your MD immediately if symptoms less severe.  You must read complete instructions/literature along with all the possible adverse reactions/side effects for all the Medicines you take and that have been prescribed to you. Take any new Medicines after you have completely understood and accpet all the possible adverse reactions/side effects.   Do not drive when taking Pain medications.   Do not take more than prescribed Pain, Sleep and Anxiety Medications  Special Instructions: If you have smoked or chewed Tobacco in the last 2 yrs please stop smoking, stop any regular Alcohol and or any Recreational drug use.  Wear Seat belts while driving.  Please note  You were cared for by a hospitalist during your hospital stay. Once you are discharged, your primary care physician will handle any further medical issues. Please note that NO REFILLS for any discharge medications will be authorized once you are discharged, as it is imperative that you return to your primary care physician (or establish a relationship with a primary care physician if you do not have one) for your aftercare needs so that they can reassess your need for medications and monitor your lab values.  Discharge Instructions    Diet - low sodium heart healthy    Complete by:  As directed    Increase activity slowly    Complete by:  As directed        Allergies  Allergen Reactions  . Sulfonamide Derivatives Rash    Childhood reaction      Disposition: [facility]    Consults:  Palliative care  oncology   Significant Diagnostic Studies:  Ct Chest Wo Contrast  Result Date: 07/30/2016 CLINICAL DATA:  Sepsis. Fever, fatigue. Cough for 1 week. History of stage IV lung cancer, ongoing chemotherapy. EXAM: CT CHEST WITHOUT CONTRAST TECHNIQUE: Multidetector CT imaging of the chest was performed following the  standard protocol without IV contrast. COMPARISON:  Radiographs yesterday.  Most recent chest CT 05/11/2016 FINDINGS: Cardiovascular: Atherosclerosis of the thoracic aorta. Decreased density of blood pool suggesting anemia. There are coronary artery calcifications. Small to moderate pericardial effusion is new from prior, measure simple fluid density. Largest pericardial fluid volume adjacent to the right heart measures up to 1.5 cm. Mediastinum/Nodes: Soft tissue mass about the distal right clavicle is likely a soft tissue component of an osseous lesion versus less likely adenopathy. There are prominent subcarinal nodes measuring 11 mm, not seen previously. Limited assessment for hilar adenopathy given lack contrast. Lungs/Pleura: Moderate size right pleural effusion, increased from prior exam. There is associated compressive atelectasis of the right lower and to a lesser extent upper lobes. There is a small left pleural effusion. The right apical mass has subjectively increased in size, is partially obscured by adjacent pleural fluid, and exact measurements are difficult. There are multiple new nodules in the right upper lobe, for example paramediastinal nodule measures 1.4 cm. There are multiple new nodules in the right middle and lower lobes, largest measuring 13 mm. There  scattered pulmonary nodules throughout the left lung better also new. There is studding of the left interlobar fissure. There is septal thickening in both upper lobes that has progressed from prior exam. The trachea and central bronchi are patent, with probable radiation change in the right upper lobe bronchus. Upper Abdomen: Unchanged left lobe liver lesion measuring simple fluid density, likely cyst. Second lesion/cyst adjacent the gallbladder fossa is only partially included. A left perinephric nodule is increased in size from prior exam currently 3.1 cm, previously 1.4 cm. Gastrostomy tube in the stomach is partially included.  Musculoskeletal: Distal right clavicular lucency with cortical irregularity, associated soft tissue mass. This is not definitively seen on prior. Mixed lytic and sclerotic lesion within T9 vertebral body is grossly unchanged. There is sclerosis involving C7 vertebra, partially included. Sclerotic lesion in the sternum and superior T11 vertebral body is unchanged. Scattered sclerotic rib lesions again seen. IMPRESSION: 1. Progressive malignancy in the thorax. Multiple new bilateral pulmonary nodules in both lungs, more so on the right. Dominant right apical lesion has subjectively increased, partially obscured by surrounding pleural effusion. 2. Increased size of moderate right pleural effusion. Increased small left pleural effusion. 3. Soft tissue density adjacent the distal right clavicle with underlying bony irregularity. Suspect this is a soft tissue component of bone metastasis less likely adenopathy, and appears new from prior exam. Additional bony metastatic disease in the thorax is grossly stable. 4. Increased size of left pararenal nodule suspicious for metastatic disease in the upper abdomen. 5. Upper lobe septal thickening which may reflect pulmonary edema, lymphangitic spread of disease is also considered. This is increased from prior. 6. Small pericardial effusion, new. Electronically Signed   By: Jeb Levering M.D.   On: 07/30/2016 01:19   Dg Chest Port 1 View  Result Date: 07/31/2016 CLINICAL DATA:  Post right thoracentesis EXAM: PORTABLE CHEST 1 VIEW COMPARISON:  07/29/2016, chest CT 07/30/2016 FINDINGS: Decreasing right pleural effusion since prior CT. No pneumothorax. Right upper lobe mass lesion again noted, not significantly changed. Small left pleural effusion with left lower lobe atelectasis. Heart is normal size. IMPRESSION: Decreasing right effusion following thoracentesis.  No pneumothorax. Small left pleural effusion with left base atelectasis. Right upper lobe mass, stable.  Electronically Signed   By: Rolm Baptise M.D.   On: 07/31/2016 12:47   Dg Chest Port 1 View  Result Date: 07/29/2016 CLINICAL DATA:  Tachycardia and tachypnea. Shortness of breath. Metastatic lung cancer. EXAM: PORTABLE CHEST 1 VIEW COMPARISON:  Radiograph 06/09/2016, chest CT 05/11/2016 FINDINGS: Progressive opacification of the right hemithorax, no with diffuse hazy opacity throughout. More confluent opacity in the periphery of the right upper lobe. Normal heart size. Minimal blunting of left costophrenic angle, may be small left effusion. Left lung is otherwise clear. Bony metastatic disease not as well visualized currently. IMPRESSION: Progressive hazy opacification of right hemithorax, likely layering pleural effusion. Right apical masslike opacity again seen. Probable small left pleural effusion. Electronically Signed   By: Jeb Levering M.D.   On: 07/29/2016 22:38   US Thoracentesis Asp Pleural Space W/img Guide  Result Date: 07/31/2016 INDICATION: Patient with a history of right lung cancer with right pleural effusion. Request is made for diagnostic and therapeutic thoracentesis. EXAM: ULTRASOUND GUIDED DIAGNOSTIC AND THERAPEUTIC THORACENTESIS MEDICATIONS: 1% lidocaine COMPLICATIONS: None immediate. PROCEDURE: An ultrasound guided thoracentesis was thoroughly discussed with the patient and questions answered. The benefits, risks, alternatives and complications were also discussed. The patient understands and wishes to proceed with the procedure. Written  consent was obtained. Ultrasound was performed to localize and mark an adequate pocket of fluid in the right chest. The area was then prepped and draped in the normal sterile fashion. 1% Lidocaine was used for local anesthesia. Under ultrasound guidance a Safe-T-Centesis catheter was introduced. Thoracentesis was performed. The catheter was removed and a dressing applied. FINDINGS: A total of approximately 1 L of yellow fluid was removed. Samples  were sent to the laboratory as requested by the clinical team. IMPRESSION: Successful ultrasound guided right thoracentesis yielding 1 L of pleural fluid. Read by: Saverio Danker, PA-C Electronically Signed   By: Lucrezia Europe M.D.   On: 07/31/2016 13:20       Filed Weights   07/29/16 2146 07/30/16 0305  Weight: 59 kg (130 lb) 63.4 kg (139 lb 12.4 oz)     Microbiology: Recent Results (from the past 240 hour(s))  Blood Culture (routine x 2)     Status: None (Preliminary result)   Collection Time: 07/29/16 10:36 PM  Result Value Ref Range Status   Specimen Description BLOOD RIGHT ANTECUBITAL  Final   Special Requests BOTTLES DRAWN AEROBIC AND ANAEROBIC 5ML  Final   Culture   Final    NO GROWTH 1 DAY Performed at [facility]    Report Status PENDING  Incomplete  Blood Culture (routine x 2)     Status: None (Preliminary result)   Collection Time: 07/29/16 10:37 PM  Result Value Ref Range Status   Specimen Description BLOOD RIGHT WRIST  Final   Special Requests BOTTLES DRAWN AEROBIC AND ANAEROBIC 5ML  Final   Culture   Final    NO GROWTH 1 DAY Performed at [facility]    Report Status PENDING  Incomplete  MRSA PCR Screening     Status: None   Collection Time: 07/30/16  3:35 AM  Result Value Ref Range Status   MRSA by PCR NEGATIVE NEGATIVE Final    Comment:        The GeneXpert MRSA Assay (FDA approved for NASAL specimens only), is one component of a comprehensive MRSA colonization surveillance program. It is not intended to diagnose MRSA infection nor to guide or monitor treatment for MRSA infections.   Urine culture     Status: None   Collection Time: 07/30/16 12:56 PM  Result Value Ref Range Status   Specimen Description URINE, CLEAN CATCH  Final   Special Requests NONE  Final   Culture NO GROWTH Performed at [facility]   Final   Report Status 08/01/2016 FINAL  Final  Urine culture     Status: None   Collection Time: 07/30/16 10:51 PM   Result Value Ref Range Status   Specimen Description URINE, CATHETERIZED  Final   Special Requests NONE  Final   Culture NO GROWTH Performed at [facility]   Final   Report Status 08/01/2016 FINAL  Final  Gram stain     Status: None   Collection Time: 07/31/16 12:38 PM  Result Value Ref Range Status   Specimen Description PLEURAL RIGHT  Final   Special Requests FLUID  Final   Gram Stain   Final    WBC PRESENT,BOTH PMN AND MONONUCLEAR NO ORGANISMS SEEN CYTOSPIN Performed at [facility]    Report Status 07/31/2016 FINAL  Final       Blood Culture    Component Value Date/Time   SDES PLEURAL RIGHT 07/31/2016 1238   SPECREQUEST FLUID 07/31/2016 1238   CULT NO GROWTH  Performed at [facility]  07/30/2016 2251   REPTSTATUS 07/31/2016 FINAL 07/31/2016 1238      Labs: Results for orders placed or performed during the hospital encounter of 07/29/16 (from the past 48 hour(s))  Strep pneumoniae urinary antigen     Status: None   Collection Time: 07/30/16 12:56 PM  Result Value Ref Range   Strep Pneumo Urinary Antigen NEGATIVE NEGATIVE    Comment: Performed at [facility]        Infection due to S. pneumoniae cannot be absolutely ruled out since the antigen present may be below the detection limit of the test. Performed at [facility]   Urine culture     Status: None   Collection Time: 07/30/16 12:56 PM  Result Value Ref Range   Specimen Description URINE, CLEAN CATCH    Special Requests NONE    Culture NO GROWTH Performed at [facility]     Report Status 08/01/2016 FINAL   Urinalysis, Routine w reflex microscopic     Status: Abnormal   Collection Time: 07/30/16 10:51 PM  Result Value Ref Range   Color, Urine YELLOW YELLOW   APPearance CLEAR CLEAR   Specific Gravity, Urine 1.020 1.005 - 1.030   pH 5.0 5.0 - 8.0   Glucose, UA NEGATIVE NEGATIVE mg/dL   Hgb urine dipstick NEGATIVE NEGATIVE   Bilirubin Urine NEGATIVE  NEGATIVE   Ketones, ur NEGATIVE NEGATIVE mg/dL   Protein, ur NEGATIVE NEGATIVE mg/dL   Nitrite NEGATIVE NEGATIVE   Leukocytes, UA NEGATIVE NEGATIVE   RBC / HPF 0-5 0 - 5 RBC/hpf   WBC, UA 0-5 0 - 5 WBC/hpf   Bacteria, UA RARE (A) NONE SEEN   Squamous Epithelial / LPF 0-5 (A) NONE SEEN   Mucous PRESENT   Urine culture     Status: None   Collection Time: 07/30/16 10:51 PM  Result Value Ref Range   Specimen Description URINE, CATHETERIZED    Special Requests NONE    Culture NO GROWTH Performed at [facility]     Report Status 08/01/2016 FINAL   CBC     Status: Abnormal   Collection Time: 07/31/16  3:37 AM  Result Value Ref Range   WBC 6.2 4.0 - 10.5 K/uL   RBC 2.49 (L) 3.87 - 5.11 MIL/uL   Hemoglobin 7.2 (L) 12.0 - 15.0 g/dL   HCT 23.4 (L) 36.0 - 46.0 %   MCV 94.0 78.0 - 100.0 fL   MCH 28.9 26.0 - 34.0 pg   MCHC 30.8 30.0 - 36.0 g/dL   RDW 19.5 (H) 11.5 - 15.5 %   Platelets 29 (LL) 150 - 400 K/uL    Comment: SPECIMEN CHECKED FOR CLOTS REPEATED TO VERIFY CONSISTENT WITH PREVIOUS RESULT CRITICAL RESULT CALLED TO, READ BACK BY AND VERIFIED WITH: BARHAM,M RN 12.27.17 _0  ZANDO,C   Type and screen [place]     Status: None (Preliminary result)   Collection Time: 07/31/16  8:00 AM  Result Value Ref Range   ABO/RH(D) B NEG    Antibody Screen NEG    Sample Expiration 08/03/2016    Unit Number [id number]    Blood Component Type RED CELLS,LR    Unit division 00    Status of Unit ISSUED,FINAL    Transfusion Status OK TO TRANSFUSE    Crossmatch Result COMPATIBLE    Unit Number [id number]    Blood Component Type RED CELLS,LR    Unit division 00    Status  of Unit ALLOCATED    Transfusion Status OK TO TRANSFUSE    Crossmatch Result COMPATIBLE   Prepare RBC     Status: None   Collection Time: 07/31/16  8:00 AM  Result Value Ref Range   Order Confirmation ORDER PROCESSED BY BLOOD BANK   Lactate dehydrogenase (CSF, pleural or peritoneal  fluid)     Status: Abnormal   Collection Time: 07/31/16 12:38 PM  Result Value Ref Range   LD, Fluid 140 (H) 3 - 23 U/L    Comment: (NOTE) Results should be evaluated in conjunction with serum values Performed at [facility]    Fluid Type-FLDH Pleural R   Protein, pleural or peritoneal fluid     Status: None   Collection Time: 07/31/16 12:38 PM  Result Value Ref Range   Total protein, fluid <3.0 g/dL    Comment: (NOTE) No normal range established for this test Results should be evaluated in conjunction with serum values Performed at [facility]    Fluid Type-FTP Pleural R   Body fluid cell count with differential     Status: Abnormal   Collection Time: 07/31/16 12:38 PM  Result Value Ref Range   Fluid Type-FCT Pleural R    Color, Fluid YELLOW YELLOW   Appearance, Fluid HAZY (A) CLEAR   WBC, Fluid 583 0 - 1,000 cu mm   Neutrophil Count, Fluid 34 (H) 0 - 25 %   Lymphs, Fluid 42 %   Monocyte-Macrophage-Serous Fluid 24 (L) 50 - 90 %  Gram stain     Status: None   Collection Time: 07/31/16 12:38 PM  Result Value Ref Range   Specimen Description PLEURAL RIGHT    Special Requests FLUID    Gram Stain      WBC PRESENT,BOTH PMN AND MONONUCLEAR NO ORGANISMS SEEN CYTOSPIN Performed at [facility]    Report Status 07/31/2016 FINAL   CBC     Status: Abnormal   Collection Time: 07/31/16  8:46 PM  Result Value Ref Range   WBC 6.2 4.0 - 10.5 K/uL   RBC 2.99 (L) 3.87 - 5.11 MIL/uL   Hemoglobin 8.5 (L) 12.0 - 15.0 g/dL   HCT 27.3 (L) 36.0 - 46.0 %   MCV 91.3 78.0 - 100.0 fL   MCH 28.4 26.0 - 34.0 pg   MCHC 31.1 30.0 - 36.0 g/dL   RDW 19.3 (H) 11.5 - 15.5 %   Platelets 30 (L) 150 - 400 K/uL    Comment: CONSISTENT WITH PREVIOUS RESULT  CBC     Status: Abnormal   Collection Time: 08/01/16  3:55 AM  Result Value Ref Range   WBC 5.6 4.0 - 10.5 K/uL   RBC 2.83 (L) 3.87 - 5.11 MIL/uL   Hemoglobin 8.1 (L) 12.0 - 15.0 g/dL   HCT 25.9 (L) 36.0 - 46.0 %   MCV  91.5 78.0 - 100.0 fL   MCH 28.6 26.0 - 34.0 pg   MCHC 31.3 30.0 - 36.0 g/dL   RDW 19.6 (H) 11.5 - 15.5 %   Platelets 27 (LL) 150 - 400 K/uL    Comment: CONSISTENT WITH PREVIOUS RESULT CRITICAL RESULT CALLED TO, READ BACK BY AND VERIFIED WITH: S.WRIGHT,RN [id number] 08/01/16 W.SHEA   Comprehensive metabolic panel     Status: Abnormal   Collection Time: 08/01/16  3:55 AM  Result Value Ref Range   Sodium 143 135 - 145 mmol/L   Potassium 3.8 3.5 - 5.1 mmol/L   Chloride 116 (H) 101 -  111 mmol/L   CO2 20 (L) 22 - 32 mmol/L   Glucose, Bld 130 (H) 65 - 99 mg/dL   BUN 31 (H) 6 - 20 mg/dL   Creatinine, Ser 0.58 0.44 - 1.00 mg/dL   Calcium 6.9 (L) 8.9 - 10.3 mg/dL   Total Protein 4.9 (L) 6.5 - 8.1 g/dL   Albumin 1.7 (L) 3.5 - 5.0 g/dL   AST 38 15 - 41 U/L   ALT 39 14 - 54 U/L   Alkaline Phosphatase 51 38 - 126 U/L   Total Bilirubin 0.5 0.3 - 1.2 mg/dL   GFR calc non Af Amer >60 >60 mL/min   GFR calc Af Amer >60 >60 mL/min    Comment: (NOTE) The eGFR has been calculated using the CKD EPI equation. This calculation has not been validated in all clinical situations. eGFR's persistently <60 mL/min signify possible Chronic Kidney Disease.    Anion gap 7 5 - 15       HPI :   65 y.o. female  with past medical history of lung cancer (Stage IV, NSC, met to brain and bone, s/p multiple lines of chemo and s/p radiation, CT now indicating likely lymphangitic spread) admitted on 07/29/2016 with fever and increasing fatigue that progressed to respiratory failure. Workup reveals pneumonia and worsening lung cancer.  HOSPITAL COURSE:  *  Sepsis secondary to postobstructive pneumonia/right pleural effusion Focused orders are initiated Placed broad-spectrum antibiotics, day #3, now switched to augmentin for 5 days  Blood culture 2, no growth so far Oncology notified-discussed with Dr. Julien Nordmann, prognosis guarded Prn fluid boluses DO NOT RESUSCITATE/DO NOT INTUBATE Palliative care consultation  completed Patient is hospice eligible Transfer to [facility] today  metastatic non-small cell lung cancer diagnosed in June 2016 /Right-sided pleural effusion Likely parapneumonic vs malignant Thoracentesis  12/27 with significant relief , may repeat if needed for comfort  Prognosis poor Hospice eligible  Anemia/thrombocytopenia Attributed to patient's recent chemotherapy according to significant other Monitor for active bleeding Will transfuse 1 unit of packed red blood cells, hg 7.2>8.5>8.1 Avoid heparin/Lovenox Platelets trending down , now 27   Acute hypoxic respiratory failure Questionable pulmonary edema versus lymphangitic spread of disease vs pleural effusion Continue oxygen for comfort   Discharge Exam: *  Blood pressure 97/66, pulse (!) 113, temperature 99.4 F (37.4 C), temperature source Axillary, resp. rate 20, height _0  (1.676 m), weight 63.4 kg (139 lb 12.4 oz), SpO2 95 %. Constitutional: She is oriented to person, place, and time.  Eyes: EOM are normal.  Pulmonary/Chest: She has rales.  Increased rate  Abdominal: Soft. Bowel sounds are normal.  Genitourinary:  Genitourinary Comments: Foley cath in place  Musculoskeletal:  Generalized weakness  Neurological: She is alert and oriented to person, place, and time.  Skin: There is pallor.  Nursing note and vitals reviewed.               Follow-up Information    [name], MD.   Specialty:  [facility] Medicine Contact information: [address] [place] [address] [phone]           Signed: Reyne Dumas 08/01/2016, 11:04 AM        Time spent >45 mins

## 2016-08-01 NOTE — Progress Notes (Signed)
Report called to heather at [facility]. Patient and family aware and in agreement with pending transfer. Family in room. Iv removed . Foley left for patient comfort.

## 2016-08-01 NOTE — Progress Notes (Signed)
CSW met with pt / family at bedside to assist with d/c planning. Pt has requested residential Hospice Home placement at [facility]. [facility] contacted and is able to offer placement today. Pt / family are in agreement with this plan. D/C Summary has been sent to hospice home for review. [facility] transport is required. Medical necessity form completed. Scripts included in [employer]. # for report provided to nsg.  Werner Lean LCSW [phone]

## 2016-08-01 NOTE — Progress Notes (Signed)
[facility] Liaison note  Received request from [place] for family interest in [facility] with request to transfer today.  Chart reviewed.  Met with patient, her brother Annia Belt and spoke with her partner, Teodoro Spray, by telephone, to confirm interest and explain services.  Family agreeable to transfer today.  CSW aware.    Registration paperwork completed.  Family/patient request Dr. Julien Nordmann assume care at [facility], as he has been following her.    Please fax discharge summary to:  [phone]. Rn:  Please call report to:  [phone].   Please arrange transport for patient as soon as possible to [facility].    Thank you, Efrain Sella, RN, BSN [facility] Liaisons are now on AMION.  You can reach me before 5 today at [phone].  [facility] 24 hour number is [phone].

## 2016-08-01 NOTE — Progress Notes (Deleted)
Mrs. 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  M is here for a follow up appointment for adenocarcinoma with brain metastasis.  Headache-frequency,location:  Fatigue:  Auditory changes(manifest post radiation):  Nausea/vomiting Ataxia(unsteady gait):  Vision (Blurred/double vision,blind spots, and peripheral vsion changes):  Fine motor movement-picking up objects with fingers,holding objects, writing, weakness of lower extremities:  Cognitive changes:   Aphasia/Slurred speech:  Weight changes, if any: Respiratory complaints, if any:  Hemoptysis, if any:   Swallowing Problems/Pain/Difficulty swallowing: Smoking Tobacco/Marijuana/Snuff/ETOH use: Appetite : Pain:

## 2016-08-02 ENCOUNTER — Telehealth: Payer: Self-pay | Admitting: Medical Oncology

## 2016-08-02 ENCOUNTER — Telehealth: Payer: Self-pay | Admitting: Internal Medicine

## 2016-08-02 ENCOUNTER — Other Ambulatory Visit: Payer: Self-pay | Admitting: Nurse Practitioner

## 2016-08-02 NOTE — Telephone Encounter (Signed)
Called patient re lab/fu. Per relative patient passed aware this morning.

## 2016-08-02 NOTE — Telephone Encounter (Signed)
Pt died today . 

## 2016-08-04 LAB — TYPE AND SCREEN
ABO/RH(D): B NEG
Antibody Screen: NEGATIVE
Unit division: 0
Unit division: 0

## 2016-08-04 LAB — CULTURE, BLOOD (ROUTINE X 2)
Culture: NO GROWTH
Culture: NO GROWTH

## 2016-08-05 LAB — CULTURE, BODY FLUID-BOTTLE

## 2016-08-05 LAB — CULTURE, BODY FLUID W GRAM STAIN -BOTTLE: Culture: NO GROWTH

## 2016-08-05 DEATH — deceased

## 2016-08-07 ENCOUNTER — Ambulatory Visit: Payer: BC Managed Care – PPO | Admitting: Internal Medicine

## 2016-08-07 ENCOUNTER — Other Ambulatory Visit: Payer: BC Managed Care – PPO

## 2016-08-12 ENCOUNTER — Inpatient Hospital Stay: Admission: RE | Admit: 2016-08-12 | Payer: Self-pay | Source: Ambulatory Visit | Admitting: Radiation Oncology

## 2016-08-28 NOTE — Progress Notes (Signed)
  Radiation Oncology         [phone] ________________________________  Name: KARN DERK MRN: [id number]  Date: 07/09/2016  DOB: Apr 17, 1951   SPECIAL TREATMENT PROCEDURE   3D TREATMENT PLANNING AND DOSIMETRY: The patient's radiation plan was reviewed and approved by Dr. Annette Stable from neurosurgery and radiation oncology prior to treatment. It showed 3-dimensional radiation distributions overlaid onto the planning CT/MRI image set. The [facility] for the target structures as well as the organs at risk were reviewed. The documentation of the 3D plan and dosimetry are filed in the radiation oncology EMR.   NARRATIVE: The patient was brought to the TrueBeam stereotactic radiation treatment machine and placed supine on the CT couch. The head frame was applied, and the patient was set up for stereotactic radiosurgery. Neurosurgery was present for the set-up and delivery   SIMULATION VERIFICATION: In the couch zero-angle position, the patient underwent Exactrac imaging using the Brainlab system with orthogonal KV images. These were carefully aligned and repeated to confirm treatment position for each of the isocenters. The Exactrac snap film verification was repeated at each couch angle.   SPECIAL TREATMENT PROCEDURE: The patient received stereotactic radiosurgery to the following targets:  1.  PTV2 Left Frontal 27m target was treated using 2 Arcs to a prescription dose of 20 Gy. ExacTrac Snap verification was performed for each couch angle.  2.  PTV3 Rt Lat Cerebellum 66mtarget was treated using 3 Arcs to a prescription dose of 20 Gy. ExacTrac Snap verification was performed for each couch angle. 3.  PTV4 Rt Med Cerebellum 28m22marget was treated using 3 Arcs to a prescription dose of 20 Gy. ExacTrac Snap verification was performed for each couch angle.  STEREOTACTIC TREATMENT MANAGEMENT: Following delivery, the patient was transported to nursing in stable condition and monitored for possible acute effects.  Vital signs were recorded . The patient tolerated treatment without significant acute effects, and was discharged to home in stable condition.  PLAN: Follow-up in one month.   ------------------------------------------------  JohJodelle GrossD, PhD

## 2016-08-28 NOTE — Progress Notes (Signed)
  Radiation Oncology         [phone] ________________________________  Name: Laura Walter MRN: [id number]  Date: 07/01/2016  DOB: [birth year]  DIAGNOSIS:     ICD-9-CM ICD-10-CM   1. Brain metastases ([place]) 198.3 C79.31     NARRATIVE:  The patient was brought to the [facility].  Identity was confirmed.  All relevant records and images related to the planned course of therapy were reviewed.  The patient freely provided informed written consent to proceed with treatment after reviewing the details related to the planned course of therapy. The consent form was witnessed and verified by the simulation staff. Intravenous access was established for contrast administration. Then, the patient was set-up in a stable reproducible supine position for radiation therapy.  A relocatable thermoplastic stereotactic head frame was fabricated for precise immobilization.  CT images were obtained.  Surface markings were placed.  The CT images were loaded into the planning software and fused with the patient's targeting MRI scan.  Then the target and avoidance structures were contoured.  Treatment planning then occurred.  The radiation prescription was entered and confirmed.  I have requested 3D planning  I have requested a DVH of the following structures: Brain stem, brain, left eye, right eye, lenses, optic chiasm, target volumes, uninvolved brain, and normal tissue.    SPECIAL TREATMENT PROCEDURE:  The planned course of therapy using radiation constitutes a special treatment procedure. Special care is required in the management of this patient for the following reasons. This treatment constitutes a Special Treatment Procedure for the following reason: High dose per fraction requiring special monitoring for increased toxicities of treatment including daily imaging.  The special nature of the planned course of radiotherapy will require increased physician supervision and oversight to ensure patient's  safety with optimal treatment outcomes.  PLAN:  The patient will receive 20 Gy in 1 fraction.   ------------------------------------------------  Jodelle Gross, MD, PhD

## 2016-11-18 ENCOUNTER — Ambulatory Visit: Payer: BC Managed Care – PPO | Admitting: Neurology
